# Patient Record
Sex: Male | Born: 1950
Health system: Southern US, Community
[De-identification: ages and names within clinical notes are randomized; demographics above are authoritative.]

## PROBLEM LIST (undated history)

## (undated) DIAGNOSIS — K589 Irritable bowel syndrome without diarrhea: Secondary | ICD-10-CM

## (undated) DIAGNOSIS — F329 Major depressive disorder, single episode, unspecified: Secondary | ICD-10-CM

## (undated) DIAGNOSIS — K219 Gastro-esophageal reflux disease without esophagitis: Secondary | ICD-10-CM

## (undated) DIAGNOSIS — E785 Hyperlipidemia, unspecified: Secondary | ICD-10-CM

## (undated) DIAGNOSIS — C966 Unifocal Langerhans-cell histiocytosis: Secondary | ICD-10-CM

## (undated) DIAGNOSIS — K259 Gastric ulcer, unspecified as acute or chronic, without hemorrhage or perforation: Secondary | ICD-10-CM

## (undated) DIAGNOSIS — G8929 Other chronic pain: Secondary | ICD-10-CM

## (undated) DIAGNOSIS — N400 Enlarged prostate without lower urinary tract symptoms: Secondary | ICD-10-CM

## (undated) DIAGNOSIS — T7840XA Allergy, unspecified, initial encounter: Secondary | ICD-10-CM

## (undated) DIAGNOSIS — M549 Dorsalgia, unspecified: Secondary | ICD-10-CM

## (undated) DIAGNOSIS — F32A Depression, unspecified: Secondary | ICD-10-CM

## (undated) DIAGNOSIS — Z6831 Body mass index (BMI) 31.0-31.9, adult: Secondary | ICD-10-CM

## (undated) DIAGNOSIS — I10 Essential (primary) hypertension: Secondary | ICD-10-CM

## (undated) HISTORY — PX: OTHER SURGICAL HISTORY: SHX169

## (undated) HISTORY — PX: GANGLION CYST EXCISION: SHX1691

## (undated) HISTORY — PX: BACK SURGERY: SHX140

## (undated) HISTORY — DX: Essential (primary) hypertension: I10

## (undated) HISTORY — DX: Dorsalgia, unspecified: M54.9

## (undated) HISTORY — DX: Allergy, unspecified, initial encounter: T78.40XA

## (undated) HISTORY — PX: HAND SURGERY: SHX662

## (undated) HISTORY — DX: Body mass index (BMI) 31.0-31.9, adult: Z68.31

## (undated) HISTORY — DX: Irritable bowel syndrome, unspecified: K58.9

## (undated) HISTORY — DX: Major depressive disorder, single episode, unspecified: F32.9

## (undated) HISTORY — DX: Gastric ulcer, unspecified as acute or chronic, without hemorrhage or perforation: K25.9

## (undated) HISTORY — DX: Hyperlipidemia, unspecified: E78.5

## (undated) HISTORY — DX: Other chronic pain: G89.29

## (undated) HISTORY — DX: Unifocal Langerhans-cell histiocytosis: C96.6

## (undated) HISTORY — PX: COLONOSCOPY: SHX174

## (undated) HISTORY — DX: Benign prostatic hyperplasia without lower urinary tract symptoms: N40.0

## (undated) HISTORY — DX: Depression, unspecified: F32.A

---

## 1992-06-08 HISTORY — PX: TUMOR REMOVAL: SHX12

## 2001-06-08 HISTORY — PX: COLONOSCOPY: SHX174

## 2001-09-09 ENCOUNTER — Ambulatory Visit (HOSPITAL_COMMUNITY): Admission: RE | Admit: 2001-09-09 | Discharge: 2001-09-09 | Payer: Self-pay | Admitting: Internal Medicine

## 2001-10-02 ENCOUNTER — Emergency Department (HOSPITAL_COMMUNITY): Admission: EM | Admit: 2001-10-02 | Discharge: 2001-10-02 | Payer: Self-pay | Admitting: Emergency Medicine

## 2001-10-25 ENCOUNTER — Ambulatory Visit (HOSPITAL_COMMUNITY): Admission: RE | Admit: 2001-10-25 | Discharge: 2001-10-25 | Payer: Self-pay | Admitting: Family Medicine

## 2001-10-25 ENCOUNTER — Encounter: Payer: Self-pay | Admitting: Family Medicine

## 2002-10-27 ENCOUNTER — Ambulatory Visit (HOSPITAL_COMMUNITY): Admission: RE | Admit: 2002-10-27 | Discharge: 2002-10-27 | Payer: Self-pay | Admitting: Family Medicine

## 2002-10-27 ENCOUNTER — Encounter: Payer: Self-pay | Admitting: Family Medicine

## 2004-07-12 ENCOUNTER — Emergency Department (HOSPITAL_COMMUNITY): Admission: EM | Admit: 2004-07-12 | Discharge: 2004-07-13 | Payer: Self-pay | Admitting: Emergency Medicine

## 2006-02-27 ENCOUNTER — Emergency Department (HOSPITAL_COMMUNITY): Admission: EM | Admit: 2006-02-27 | Discharge: 2006-02-27 | Payer: Self-pay | Admitting: Emergency Medicine

## 2006-07-14 ENCOUNTER — Emergency Department (HOSPITAL_COMMUNITY): Admission: EM | Admit: 2006-07-14 | Discharge: 2006-07-15 | Payer: Self-pay | Admitting: Emergency Medicine

## 2008-02-27 ENCOUNTER — Emergency Department (HOSPITAL_COMMUNITY): Admission: EM | Admit: 2008-02-27 | Discharge: 2008-02-28 | Payer: Self-pay | Admitting: Emergency Medicine

## 2008-05-29 ENCOUNTER — Ambulatory Visit (HOSPITAL_COMMUNITY): Admission: RE | Admit: 2008-05-29 | Discharge: 2008-05-29 | Payer: Self-pay | Admitting: Family Medicine

## 2008-06-14 ENCOUNTER — Ambulatory Visit (HOSPITAL_COMMUNITY): Admission: RE | Admit: 2008-06-14 | Discharge: 2008-06-14 | Payer: Self-pay | Admitting: Neurosurgery

## 2008-09-20 ENCOUNTER — Ambulatory Visit (HOSPITAL_COMMUNITY): Admission: RE | Admit: 2008-09-20 | Discharge: 2008-09-20 | Payer: Self-pay | Admitting: Neurosurgery

## 2009-02-06 ENCOUNTER — Emergency Department (HOSPITAL_COMMUNITY): Admission: EM | Admit: 2009-02-06 | Discharge: 2009-02-06 | Payer: Self-pay | Admitting: Emergency Medicine

## 2009-02-14 ENCOUNTER — Ambulatory Visit (HOSPITAL_COMMUNITY): Admission: RE | Admit: 2009-02-14 | Discharge: 2009-02-14 | Payer: Self-pay | Admitting: Family Medicine

## 2009-06-05 ENCOUNTER — Encounter (INDEPENDENT_AMBULATORY_CARE_PROVIDER_SITE_OTHER): Payer: Self-pay | Admitting: *Deleted

## 2009-06-07 ENCOUNTER — Emergency Department (HOSPITAL_COMMUNITY): Admission: EM | Admit: 2009-06-07 | Discharge: 2009-06-07 | Payer: Self-pay | Admitting: Emergency Medicine

## 2009-06-08 HISTORY — PX: UPPER GASTROINTESTINAL ENDOSCOPY: SHX188

## 2009-06-10 ENCOUNTER — Telehealth (INDEPENDENT_AMBULATORY_CARE_PROVIDER_SITE_OTHER): Payer: Self-pay

## 2009-06-17 DIAGNOSIS — R143 Flatulence: Secondary | ICD-10-CM

## 2009-06-17 DIAGNOSIS — R197 Diarrhea, unspecified: Secondary | ICD-10-CM | POA: Insufficient documentation

## 2009-06-17 DIAGNOSIS — R141 Gas pain: Secondary | ICD-10-CM | POA: Insufficient documentation

## 2009-06-17 DIAGNOSIS — R1310 Dysphagia, unspecified: Secondary | ICD-10-CM | POA: Insufficient documentation

## 2009-06-17 DIAGNOSIS — R109 Unspecified abdominal pain: Secondary | ICD-10-CM | POA: Insufficient documentation

## 2009-06-17 DIAGNOSIS — Z87448 Personal history of other diseases of urinary system: Secondary | ICD-10-CM

## 2009-06-17 DIAGNOSIS — R142 Eructation: Secondary | ICD-10-CM

## 2009-06-19 ENCOUNTER — Ambulatory Visit: Payer: Self-pay | Admitting: Gastroenterology

## 2009-06-19 DIAGNOSIS — K3189 Other diseases of stomach and duodenum: Secondary | ICD-10-CM | POA: Insufficient documentation

## 2009-06-19 DIAGNOSIS — K921 Melena: Secondary | ICD-10-CM | POA: Insufficient documentation

## 2009-06-19 DIAGNOSIS — R1013 Epigastric pain: Secondary | ICD-10-CM

## 2009-06-20 ENCOUNTER — Encounter: Payer: Self-pay | Admitting: Gastroenterology

## 2009-06-21 ENCOUNTER — Ambulatory Visit: Payer: Self-pay | Admitting: Gastroenterology

## 2009-06-21 ENCOUNTER — Ambulatory Visit (HOSPITAL_COMMUNITY): Admission: RE | Admit: 2009-06-21 | Discharge: 2009-06-21 | Payer: Self-pay | Admitting: Gastroenterology

## 2009-06-21 HISTORY — PX: COLONOSCOPY: SHX174

## 2009-06-27 ENCOUNTER — Telehealth: Payer: Self-pay | Admitting: Gastroenterology

## 2009-06-27 ENCOUNTER — Ambulatory Visit: Payer: Self-pay | Admitting: Gastroenterology

## 2009-06-27 ENCOUNTER — Ambulatory Visit (HOSPITAL_COMMUNITY): Admission: RE | Admit: 2009-06-27 | Discharge: 2009-06-27 | Payer: Self-pay | Admitting: Gastroenterology

## 2009-07-03 ENCOUNTER — Ambulatory Visit (HOSPITAL_COMMUNITY): Admission: RE | Admit: 2009-07-03 | Discharge: 2009-07-03 | Payer: Self-pay | Admitting: Gastroenterology

## 2009-07-10 ENCOUNTER — Encounter: Payer: Self-pay | Admitting: Gastroenterology

## 2009-08-07 ENCOUNTER — Telehealth: Payer: Self-pay | Admitting: Gastroenterology

## 2009-08-16 ENCOUNTER — Encounter (INDEPENDENT_AMBULATORY_CARE_PROVIDER_SITE_OTHER): Payer: Self-pay | Admitting: *Deleted

## 2009-08-27 ENCOUNTER — Ambulatory Visit: Payer: Self-pay | Admitting: Gastroenterology

## 2009-08-27 DIAGNOSIS — L980 Pyogenic granuloma: Secondary | ICD-10-CM | POA: Insufficient documentation

## 2009-09-02 ENCOUNTER — Telehealth (INDEPENDENT_AMBULATORY_CARE_PROVIDER_SITE_OTHER): Payer: Self-pay

## 2009-11-06 HISTORY — PX: UPPER GASTROINTESTINAL ENDOSCOPY: SHX188

## 2009-11-13 ENCOUNTER — Ambulatory Visit: Payer: Self-pay | Admitting: Gastroenterology

## 2009-11-21 ENCOUNTER — Ambulatory Visit (HOSPITAL_COMMUNITY): Admission: RE | Admit: 2009-11-21 | Discharge: 2009-11-21 | Payer: Self-pay | Admitting: Gastroenterology

## 2009-11-21 ENCOUNTER — Ambulatory Visit: Payer: Self-pay | Admitting: Gastroenterology

## 2009-12-05 ENCOUNTER — Encounter: Payer: Self-pay | Admitting: Gastroenterology

## 2009-12-10 ENCOUNTER — Encounter: Payer: Self-pay | Admitting: Gastroenterology

## 2009-12-16 ENCOUNTER — Encounter: Payer: Self-pay | Admitting: Gastroenterology

## 2009-12-17 ENCOUNTER — Encounter: Payer: Self-pay | Admitting: Gastroenterology

## 2009-12-19 LAB — CONVERTED CEMR LAB
Basophils Absolute: 0.1 10*3/uL (ref 0.0–0.1)
Basophils Relative: 1 % (ref 0–1)
Eosinophils Absolute: 0.4 10*3/uL (ref 0.0–0.7)
HCT: 47 % (ref 39.0–52.0)
Lymphocytes Relative: 24 % (ref 12–46)
MCHC: 33.4 g/dL (ref 30.0–36.0)
MCV: 85.1 fL (ref 78.0–100.0)
Monocytes Relative: 7 % (ref 3–12)
Neutro Abs: 4.4 10*3/uL (ref 1.7–7.7)
RBC: 5.52 M/uL (ref 4.22–5.81)
WBC: 7 10*3/uL (ref 4.0–10.5)

## 2009-12-26 ENCOUNTER — Ambulatory Visit: Payer: Self-pay | Admitting: Infectious Diseases

## 2009-12-26 LAB — CONVERTED CEMR LAB

## 2009-12-28 ENCOUNTER — Encounter: Payer: Self-pay | Admitting: Infectious Diseases

## 2009-12-31 ENCOUNTER — Encounter: Payer: Self-pay | Admitting: Infectious Diseases

## 2010-01-08 ENCOUNTER — Telehealth: Payer: Self-pay | Admitting: Gastroenterology

## 2010-01-10 ENCOUNTER — Ambulatory Visit: Payer: Self-pay | Admitting: Infectious Diseases

## 2010-01-21 ENCOUNTER — Encounter: Payer: Self-pay | Admitting: Gastroenterology

## 2010-01-28 ENCOUNTER — Telehealth: Payer: Self-pay | Admitting: Infectious Diseases

## 2010-01-28 ENCOUNTER — Encounter: Payer: Self-pay | Admitting: Infectious Diseases

## 2010-04-08 HISTORY — PX: UPPER GASTROINTESTINAL ENDOSCOPY: SHX188

## 2010-04-24 ENCOUNTER — Encounter (INDEPENDENT_AMBULATORY_CARE_PROVIDER_SITE_OTHER): Payer: Self-pay | Admitting: *Deleted

## 2010-04-25 ENCOUNTER — Encounter: Payer: Self-pay | Admitting: Gastroenterology

## 2010-04-29 ENCOUNTER — Ambulatory Visit (HOSPITAL_COMMUNITY): Admission: RE | Admit: 2010-04-29 | Discharge: 2010-04-29 | Payer: Self-pay | Admitting: Gastroenterology

## 2010-04-29 ENCOUNTER — Ambulatory Visit: Payer: Self-pay | Admitting: Gastroenterology

## 2010-07-08 NOTE — Assessment & Plan Note (Signed)
Summary: Dypepsia 2o to Eosinophilic granuloma, IBS   Visit Type:  Follow-up Visit Primary Care Provider:  Lala Lund, M.D.  Chief Complaint:  follow up- doing good.  History of Present Illness: Took all Abx and feels better. Dr. [name] wants to wait and see for now. Sx: fullness, bloating in lower abdomen- 1-2x/week. Feel a gas buildup and better if has BM mostly (mostly diarrhea). Stools mostly soft these days. Appetite is good. Lost 5 lbs, but wasn't trying to lose. No nausea or vomiting. Eats a little cheese. No eggs in 2 weeks. No milk since year. No ice cream.   Current Medications (verified): 1)  Verelan 180 Mg Xr24h-Cap (Verapamil Hcl) .... Take 1 Tablet By Mouth Once A Day 2)  Enalapril Maleate 20 Mg Tabs (Enalapril Maleate) .... Take 1 Tablet By Mouth Once A Day 3)  Pravastatin Sodium 20 Mg Tabs (Pravastatin Sodium) .... Take 1 Tab By Mouth At Bedtime 4)  Cardura 8 Mg Tabs (Doxazosin Mesylate) .... Take 1 Tab By Mouth At Bedtime 5)  Paxil 20 Mg Tabs (Paroxetine Hcl) .... Take 1 Tablet By Mouth Once A Day 6)  Protonix 40 Mg Tbec (Pantoprazole Sodium) .... Take 1 Tablet By Mouth 30 Minutes Prior To First Meal  Allergies (verified): 1)  ! Sulfa  Past History:  Past Medical History: Hyperlipidemia Hypertension Depression TCS 2003: NUR-normal Eosinophilic granuloma (gastric)  Social History: Occupation: unemployed, was working at [employer] Married: 35 years, met via family. 1 kid:  ~31. Quit smoking cigars: 2-3/day, but none for  ~5 years. Likes to garden-planting 2 acres every year.  Vital Signs:  Patient profile:   60 year old male Height:      64 inches Weight:      180 pounds BMI:     31.01 Temp:     97.9 degrees F oral Pulse rate:   60 / minute BP sitting:   118 / 80  (left arm) Cuff size:   regular  Vitals Entered By: Hendricks Limes LPN (August 27, 2009 9:55 AM)  Physical Exam  General:  Well developed, well nourished, no acute distress. Head:   Normocephalic and atraumatic. Eyes:  PERRLA, no icterus. Mouth:  No deformity or lesions. Neck:  Supple; no masses. Lungs:  Clear throughout to auscultation. Heart:  Regular rate and rhythm; no murmurs Abdomen:  Soft, nontender and nondistended. Normal bowel sounds. Extremities:  No edema noted. Neurologic:  Alert and  oriented x4;  grossly normal neurologically.  Impression & Recommendations:  Problem # 1:  ABDOMINAL PAIN (ICD-789.00)  [place] 2o to IBS. Add Benefiber daily and Intensive Bowel Support daily.   Orders: Est. Patient Level IV ([address])  Problem # 2:  GRANULOMA (ICD-686.1) Assessment: Unchanged  eosinophilic of the stomach, most likely cause of dyspepsia. Currently no Sx. OPV in 3 mo. May need to check CBC.  CC: PCP  Orders: Est. Patient Level IV ([address])  Patient Instructions: 1)  Add Benefiber 2 tsp daily. 2)  Add Intesive Bowel Support daily. 3)  Call me in 2 weeks. 4)  Continue to follow with Dr. [name]. 5)  Return visit in 3 months. 6)  The medication list was reviewed and reconciled.  All changed / newly prescribed medications were explained.  A complete medication list was provided to the patient / caregiver.

## 2010-07-08 NOTE — Progress Notes (Signed)
Summary: DR. Harley Hallmark  Spoek with Dr. [name]. Pt being treated empirically for H. pylori and then having follow up UGI. Consider surgery after imaging study obtained. [name] MD  August 07, 2009 2:09 PM

## 2010-07-08 NOTE — Letter (Signed)
Summary: TRIAGE ORDER  TRIAGE ORDER   Imported By: Ave Filter 04/25/2010 15:25:24  _____________________________________________________________________  External Attachment:    Type:   Image     Comment:   External Document

## 2010-07-08 NOTE — Letter (Signed)
Summary: Recall Radiology  [facility]  [address]   [place], [place] [address]   Phone: [phone]  Fax: [phone]    April 24, 2010  Adam [name] [address] [place], [place]  [address] 11-18-1950   Dear Adam [name],   Our office needs to get you scheduled for your Upper Endoscopy. Please give our office a call to schedule this.  You may call the office at your convenience at [phone].  Please ask for the Referral Coordinator to make arrangements for this to be scheduled.  You may have to leave a message on our voice mail.  We will return your call.  If for any reason you do not wish to schedule this, please advise the office.  Please do not neglect your health.   Thank you,    Ave Filter  [facility] Ph: [phone]   Fax: [phone]

## 2010-07-08 NOTE — Letter (Signed)
Summary: EGD ORDER  EGD ORDER   Imported By: Ave Filter 11/13/2009 16:22:19  _____________________________________________________________________  External Attachment:    Type:   Image     Comment:   External Document

## 2010-07-08 NOTE — Letter (Signed)
Summary: TCS/EGD ORDER  TCS/EGD ORDER   Imported By: Ave Filter 06/20/2009 17:47:38  _____________________________________________________________________  External Attachment:    Type:   Image     Comment:   External Document
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
## 2010-07-08 NOTE — Miscellaneous (Signed)
Summary: Orders Update  Clinical Lists Changes  Orders: Added new Test order of T-CBC w/Diff ([id number]) - Signed 

## 2010-07-08 NOTE — Consult Note (Signed)
Summary: [facility]: General Surgical  [facility]: General Surgical   Imported By: Florinda Marker 01/29/2010 15:47:40  _____________________________________________________________________  External Attachment:    Type:   Image     Comment:   External Document

## 2010-07-08 NOTE — Letter (Signed)
Summary: Generic Letter, Intro to Referring  [facility]  [address]   [place], [place] [address]   Phone: [phone]  Fax: [phone]      August 16, 2009             RE: Adam [name]   12-09-1950                 [address] [place]  [address]  Dear Mr [name] have an appointment with [place] on Tues March 15th. The provider you are scheduled to see will be out of the office. We need to rsch  your appt. We have tried to contact by phone and was unable to reach. you. Please call our office at [phone] so that we can reschedule your appointment.  Thank you.     Sincerely,  [facility] Ph: [phone]   Fax: [phone]

## 2010-07-08 NOTE — Progress Notes (Signed)
  Phone Note Call from Patient Call back at Home Phone [phone]   Caller: Patient Summary of Call: pt called- he wanted to let [facility] know that the fiber was doing great. Initial call taken by: Hendricks Limes LPN,  September 02, 2009 10:19 AM     Appended Document:  [facility] 2011: Dr. [facility]-recommend EGD in 2-3 mos. Call pt to schedule in June 2011, UJ:[id number] granuloma.  Appended Document:  Noted, will call pt in June.

## 2010-07-08 NOTE — Assessment & Plan Note (Signed)
Summary: NEW EVALUATE FOR PARASITIC INF/EOSINOPHILIC GRANULOMA   Primary Provider:  Simone Curia, M.D.  CC:  New patient / evaluate for parasitic inf/ eosinophilic granul.  History of Present Illness: 60 yo M with hx depression, HTN, hyperlipidemia. He had an  EGD and biopsy of gastric nodule showing eosinophilic abscesses and infiltrates. He was treated for H pylori and his GI signs improved. (H pylori studies negative). Strong consideration was given from path to the dx of anisakiasis or strongyloidiasis (although no organisms were seen).   Has lived in [place] his entire life, no hx of travel, never been in [place] [employer] [place]. No fevers or chills, wt is steady. No lymphadenopathy, no hx of raw fish ingestion, has prev cleaned fish.    Preventive Screening-Counseling & Management  Alcohol-Tobacco     Alcohol drinks/day: 0     Smoking Status: never  Caffeine-Diet-Exercise     Caffeine use/day: coffee,tea and sodas     Does Patient Exercise: yes     Type of exercise: yard and gardening  Safety-Violence-Falls     Seat Belt Use: yes   Updated Prior Medication List: VERELAN 180 MG XR24H-CAP (VERAPAMIL HCL) Take 1 tablet by mouth once a day ENALAPRIL MALEATE 20 MG TABS (ENALAPRIL MALEATE) Take 1 tablet by mouth once a day PRAVASTATIN SODIUM 20 MG TABS (PRAVASTATIN SODIUM) Take 1 tab by mouth at bedtime CARDURA 8 MG TABS (DOXAZOSIN MESYLATE) Take 1 tab by mouth at bedtime PAXIL 20 MG TABS (PAROXETINE HCL) Take 1 tablet by mouth once a day PROTONIX 40 MG TBEC (PANTOPRAZOLE SODIUM) Take 1 tablet by mouth 30 minutes prior to first meal  Current Allergies (reviewed today): ! SULFA Review of Systems        pets, cont GI upset, occas loose BM.   Vital Signs:  Patient profile:   60 year old male Height:      64 inches (162.56 cm) Weight:      179 pounds (81.36 kg) BMI:     30.84 Temp:     98.0 degrees F (36.67 degrees C) oral Pulse rate:   62 / minute BP sitting:   117 / 80  (left  arm)  Vitals Entered By: Baxter Hire) (December 26, 2009 2:06 PM) CC: New patient / evaluate for parasitic inf/ eosinophilic granul Pain Assessment Patient in pain? no      Nutritional Status BMI of > 30 = obese Nutritional Status Detail appetite varies per patient  Does patient need assistance? Functional Status Self care Ambulation Normal   Physical Exam  General:  well-developed, well-nourished, and well-hydrated.   Eyes:  pupils equal, pupils round, and pupils reactive to light.   Mouth:  pharynx pink and moist and no exudates.   Neck:  no masses.   Lungs:  normal respiratory effort and normal breath sounds.   Heart:  normal rate, regular rhythm, and no murmur.   Abdomen:  soft, non-tender, normal bowel sounds, no distention, and no masses.   Extremities:  no edema, partial R hand amputation.    Impression & Recommendations:  Problem # 1:  GRANULOMA (ICD-686.1)  Eosinophilic granuloma. The etiology of this unclear- he has no exposure to fish or farm animals or pets. There is a wide DDX- Ancylostoma, Anisakis, Ascaris, Strongyloides, Toxicara, Trichura, Capillaria, and Trichinella Will check stool studies, check serologies for above. His peripheral eosinophilia is low grade, only 6%.  return to clinic 3-4 weeks.   Orders: T-Culture, Stool ([id number]) T-Culture, Giardia / Cryptosporidium ([id number]) T- * Misc.  Laboratory test [phone]) T-HIV Antibody  (Reflex) [phone]) Consultation Level IV ([address])

## 2010-07-08 NOTE — Miscellaneous (Signed)
Summary: HIPAA Restrictions  HIPAA Restrictions   Imported By: Florinda Marker 12/26/2009 14:31:58  _____________________________________________________________________  External Attachment:    Type:   Image     Comment:   External Document

## 2010-07-08 NOTE — Letter (Signed)
Summary: CT SCAN OF ABD,PELVIS ORDER  CT SCAN OF ABD,PELVIS ORDER   Imported By: Ricard Dillon 07/10/2009 12:09:04  _____________________________________________________________________  External Attachment:    Type:   Image     Comment:   External Document

## 2010-07-08 NOTE — Letter (Signed)
Summary: [facility] OFFICE VISIT  [facility] OFFICE VISIT   Imported By: Rexene Alberts 01/21/2010 11:57:41  _____________________________________________________________________  External Attachment:    Type:   Image     Comment:   External Document

## 2010-07-08 NOTE — Progress Notes (Signed)
Summary: Start Protonix       New/Updated Medications: PROTONIX 40 MG TBEC (PANTOPRAZOLE SODIUM) Take 1 tablet by mouth 30 minutes prior to first meal Prescriptions: PROTONIX 40 MG TBEC (PANTOPRAZOLE SODIUM) Take 1 tablet by mouth 30 minutes prior to first meal  #30 x 5   Entered and Authorized by:   [name] MD   Signed by:   [name] MD on 06/27/2009   Method used:   Electronically to        [facility] [phone]* (retail)       [address]       [place], [place]  [address]       Ph: [phone] or [phone]       Fax: [phone]   RxID:   [id number]  Pt seen for EGD. Needs Protonix. [name] MD  June 27, 2009 8:32 AM

## 2010-07-08 NOTE — Consult Note (Signed)
Summary: New Pt. Referral: [facility]  New Pt. Referral: [facility]   Imported By: Florinda Marker 01/02/2010 15:58:17  _____________________________________________________________________  External Attachment:    Type:   Image     Comment:   External Document

## 2010-07-08 NOTE — Progress Notes (Signed)
Summary: pt appt  Phone Note Call from Patient Call back at Home Phone [phone]   Caller: Spouse- [name] Summary of Call: pts wife called- pt was seen in the ER on Friday with abd swelling, pain, vomiting and diarrhea. Wife stated they told him he was heme +. pt has appt with KJ on 07/08/09. Wife is upset and  wants him to see [facility] and wants him seen earlier. no appts available. please advise. Initial call taken by: Hendricks Limes LPN,  June 10, 2009 9:33 AM     Appended Document: pt appt Please call pt. I can see him on Jan 12. 18, or 19 at 130 PM.  Appended Document: pt appt he is going to be here on the 12th at 130p/

## 2010-07-08 NOTE — Assessment & Plan Note (Signed)
Summary: EOSINOPHILIC GRANULOMA   Visit Type:  Follow-up Visit Primary Care Provider:  Simone Curia, M.D.  Chief Complaint:  F/U abd pain.  History of Present Illness: Doing fairly well. Avoiding spicy foods, cheese, milk, and alcohol. No nause, vomiting. Rare abd pain if eats something he usually avoids for several times in a row.   Current Medications (verified): 1)  Verelan 180 Mg Xr24h-Cap (Verapamil Hcl) .... Take 1 Tablet By Mouth Once A Day 2)  Enalapril Maleate 20 Mg Tabs (Enalapril Maleate) .... Take 1 Tablet By Mouth Once A Day 3)  Pravastatin Sodium 20 Mg Tabs (Pravastatin Sodium) .... Take 1 Tab By Mouth At Bedtime 4)  Cardura 8 Mg Tabs (Doxazosin Mesylate) .... Take 1 Tab By Mouth At Bedtime 5)  Paxil 20 Mg Tabs (Paroxetine Hcl) .... Take 1 Tablet By Mouth Once A Day 6)  Protonix 40 Mg Tbec (Pantoprazole Sodium) .... Take 1 Tablet By Mouth 30 Minutes Prior To First Meal  Allergies (verified): 1)  ! Sulfa  Past History:  Family History: Last updated: 06/19/2009 FH of Colon Cancer: grandmother > 60 No polyps  Past Medical History: TCS JAN 2011: COMPLICATED BY hypoTN and bardycardia w/ Demerol, Phenergan, and Versed **Garden diverticulosis, Sml IH EGD w/ propofol-Bx: Eosinophilic granuloma (gastric) JAN 2011 TCS 2003: NUR-normal Hyperlipidemia Hypertension Depression  Vital Signs:  Patient profile:   60 year old male Height:      64 inches Weight:      180 pounds BMI:     31.01 Temp:     98.9 degrees F oral Pulse rate:   60 / minute BP sitting:   120 / 80  (left arm) Cuff size:   regular  Vitals Entered By: Cloria Spring LPN (November 13, 1608 3:56 PM)  Physical Exam  General:  Well developed, well nourished, no acute distress. Head:  Normocephalic and atraumatic. Lungs:  Clear throughout to auscultation. Heart:  Regular rate and rhythm; no murmurs Abdomen:  Soft, nontender and nondistended. Normal bowel sounds. Neurologic:  Alert and  oriented x4;   grossly normal neurologically.  Impression & Recommendations:  Problem # 1:  GRANULOMA (ICD-686.1) Assessment Improved  No resection at [facility]. Repeat EGD next THUR w/ propofol. OPV 6 mos.  CC: PCP  Orders: Est. Patient Level II ([address])  Problem # 2:  SCREENING, COLON CANCER (ICD-V76.51) Assessment: Comment Only Average risk for developing colon cancer. TCS 2021.  Appended Document: EOSINOPHILIC GRANULOMA REMINDER IN COMPUTER

## 2010-07-08 NOTE — Miscellaneous (Signed)
Summary: [facility]   Imported By: Florinda Marker 01/29/2010 09:26:10  _____________________________________________________________________  External Attachment:    Type:   Image     Comment:   External Document

## 2010-07-08 NOTE — Progress Notes (Signed)
Summary: Eosinophilic granuloma  Spoke with Dr [name] yesterday. Pt Sx improved and EGD W/O ULCERATION  after H. pylori treatment. Pending ID WORKUP. Repeat EGD  IN 6 MOS. [name] MD  January 08, 2010 5:34 PM   Appended Document: Eosinophilic granuloma Called pt at hm: no VM, wk-not valid. Will try again later.   Appended Document: Eosinophilic granuloma reminder in computer  Appended Document: Eosinophilic granuloma [place] with pt at home #. Pt feeling wel. Explained he needs EGD on DEC 2011. ID [facility] pending.

## 2010-07-08 NOTE — Assessment & Plan Note (Signed)
Summary: 2WK F/U/VS   Primary Provider:  Simone Curia, M.D.  CC:  2 week follow up.  History of Present Illness: 60 yo M with hx depression, HTN, hyperlipidemia. He had an  EGD and biopsy of gastric nodule showing eosinophilic abscesses and infiltrates. He was treated for H pylori and his GI signs improved. (H pylori studies negative). Strong consideration was given from path to the dx of anisakiasis or strongyloidiasis (although no organisms were seen).   Has lived in [place] his entire life, no hx of travel, never been in [place] [employer] [place] [employer]. No fevers or chills, wt is steady. No lymphadenopathy, no hx of raw fish ingestion, has prev cleaned fish.  Was seen in ID peviously, had negative- O&P, giardia/crypto, stool cx, HIV. Other tests not done as ordered (strongy, anisikiasis, toxicara, trichinella). has since had repeat EGD which showed resolution of ulcers (after H pylori treatment).  Feeling "about the same". Still having gas and occas soft BM. Having 3-4 in the AM. States last of these BMs will be loose. If he has heavy meal during the day, he will have to go again then.    Preventive Screening-Counseling & Management  Alcohol-Tobacco     Alcohol drinks/day: 0     Smoking Status: never  Caffeine-Diet-Exercise     Caffeine use/day: coffee,tea and sodas     Does Patient Exercise: yes     Type of exercise: yard and gardening  Safety-Violence-Falls     Seat Belt Use: yes   Current Allergies (reviewed today): ! SULFA Review of Systems       wt up 2#.   Vital Signs:  Patient profile:   60 year old male Height:      64 inches (162.56 cm) Weight:      181.0 pounds (82.27 kg) BMI:     31.18 Temp:     98.4 degrees F (36.89 degrees C) oral Pulse rate:   51 / minute BP sitting:   124 / 83  (left arm)  Vitals Entered By: Baxter Hire) (January 10, 2010 10:31 AM) CC: 2 week follow up Pain Assessment Patient in pain? no      Nutritional Status BMI of > 30 =  obese Nutritional Status Detail appetite is so-so per patient  Does patient need assistance? Functional Status Self care Ambulation Normal   Physical Exam  General:  well-developed, well-nourished, and well-hydrated.   Abdomen:  soft, non-tender, and normal bowel sounds.     Impression & Recommendations:  Problem # 1:  GRANULOMA (ICD-686.1)  the etiology of this is unclear. have spokenw ith lab and clarified his orders (apparently they were attempted to be run on stool). Will check these as well as a serum quantiferon gold, ACE level. will call pt with results.   Orders: Est. Patient Level III ([address]) T- * Misc. Laboratory test [phone])

## 2010-07-08 NOTE — Letter (Signed)
Summary: Internal Other  Internal Other   Imported By: Peggyann Shoals 12/05/2009 14:07:41  _____________________________________________________________________  External Attachment:    Type:   Image     Comment:   External Document

## 2010-07-11 NOTE — Letter (Signed)
Summary: [facility] referral paperwork  [facility] referral paperwork   Imported By: Minna Merritts 12/10/2009 16:21:02  _____________________________________________________________________  External Attachment:    Type:   Image     Comment:   External Document

## 2010-08-24 LAB — BASIC METABOLIC PANEL
BUN: 14 mg/dL (ref 6–23)
GFR calc non Af Amer: >60 mL/min (ref >60)
Glucose, Bld: 82 mg/dL (ref 70–99)
Sodium: 141 mEq/L (ref 135–145)

## 2010-08-24 LAB — HEMOGLOBIN AND HEMATOCRIT, BLOOD: Hemoglobin: 14.6 g/dL (ref 13.0–17.0)

## 2010-08-25 ENCOUNTER — Encounter: Payer: Self-pay | Admitting: *Deleted

## 2010-08-26 NOTE — Progress Notes (Signed)
  Phone Note Call from Patient   Caller: Patient Summary of Call: Patient wanting reults of lab work Initial call taken by: Starleen Arms CMA,  January 28, 2010 9:53 AM

## 2010-09-08 LAB — URINALYSIS, ROUTINE W REFLEX MICROSCOPIC
Glucose, UA: NEGATIVE mg/dL
Protein, ur: NEGATIVE mg/dL
Urobilinogen, UA: 0.2 mg/dL (ref 0.0–1.0)

## 2010-09-08 LAB — COMPREHENSIVE METABOLIC PANEL
AST: 32 U/L (ref 0–37)
Albumin: 3.8 g/dL (ref 3.5–5.2)
Alkaline Phosphatase: 55 U/L (ref 39–117)
Chloride: 104 mEq/L (ref 96–112)
GFR calc Af Amer: >60 mL/min (ref >60)
Potassium: 4.4 mEq/L (ref 3.5–5.1)
Sodium: 137 mEq/L (ref 135–145)
Total Bilirubin: 0.6 mg/dL (ref 0.3–1.2)

## 2010-09-08 LAB — DIFFERENTIAL
Basophils Absolute: 0 10*3/uL (ref 0.0–0.1)
Basophils Relative: 1 % (ref 0–1)
Eosinophils Relative: 9 % — ABNORMAL HIGH (ref 0–5)
Monocytes Absolute: 0.6 10*3/uL (ref 0.1–1.0)

## 2010-09-08 LAB — CBC
HCT: 46.8 % (ref 39.0–52.0)
Platelets: 270 10*3/uL (ref 150–400)
WBC: 6.6 10*3/uL (ref 4.0–10.5)

## 2010-09-12 LAB — DIFFERENTIAL
Basophils Absolute: 0 10*3/uL (ref 0.0–0.1)
Eosinophils Absolute: 0.3 10*3/uL (ref 0.0–0.7)
Lymphs Abs: 1.1 10*3/uL (ref 0.7–4.0)
Neutrophils Relative %: 76 % (ref 43–77)

## 2010-09-12 LAB — CBC
MCV: 86.2 fL (ref 78.0–100.0)
Platelets: 252 10*3/uL (ref 150–400)
RDW: 14.1 % (ref 11.5–15.5)
WBC: 7.4 10*3/uL (ref 4.0–10.5)

## 2010-09-12 LAB — POCT CARDIAC MARKERS: Myoglobin, poc: 97.1 ng/mL (ref 12–200)

## 2010-09-12 LAB — BASIC METABOLIC PANEL
BUN: 15 mg/dL (ref 6–23)
Chloride: 106 mEq/L (ref 96–112)
Creatinine, Ser: 1.19 mg/dL (ref 0.4–1.5)
GFR calc non Af Amer: >60 mL/min (ref >60)
Glucose, Bld: 123 mg/dL — ABNORMAL HIGH (ref 70–99)

## 2010-09-22 LAB — BASIC METABOLIC PANEL
CO2: 27 mEq/L (ref 19–32)
Chloride: 103 mEq/L (ref 96–112)
Creatinine, Ser: 1.07 mg/dL (ref 0.4–1.5)
GFR calc Af Amer: >60 mL/min (ref >60)
Potassium: 4.4 mEq/L (ref 3.5–5.1)
Sodium: 137 mEq/L (ref 135–145)

## 2010-09-22 LAB — CBC
HCT: 48.3 % (ref 39.0–52.0)
Hemoglobin: 15.6 g/dL (ref 13.0–17.0)
MCHC: 32.4 g/dL (ref 30.0–36.0)
MCV: 87.2 fL (ref 78.0–100.0)
RBC: 5.54 MIL/uL (ref 4.22–5.81)
WBC: 9.8 10*3/uL (ref 4.0–10.5)

## 2010-10-14 ENCOUNTER — Encounter: Payer: Self-pay | Admitting: Gastroenterology

## 2010-10-21 NOTE — Op Note (Signed)
[name], [name]          ACCOUNT NO.:  [id number]   MEDICAL RECORD NO.:  [id number]          PATIENT TYPE:  OIB   LOCATION:  3533                         FACILITY:  [facility]   PHYSICIAN:  Reinaldo Meeker, M.D. DATE OF BIRTH:  June 20, 1950   DATE OF PROCEDURE:  06/14/2008  DATE OF DISCHARGE:  06/14/2008                               OPERATIVE REPORT   PREOPERATIVE DIAGNOSIS:  Herniated disk L4-5 right with superior  fragment with compression of L4 and L5 nerve roots.   POSTOPERATIVE DIAGNOSIS:  Herniated disk L4-5 right with superior  fragment with compression of L4 and L5 nerve roots.   PROCEDURE:  Right L4-5 interlaminar laminotomy for excision of herniated  disk with operative microscope.   SURGEON:  Reinaldo Meeker, MD   ASSISTANT:  Donalee Citrin, MD   PROCEDURE IN DETAIL:  After being placed in the prone position, the  patient's back was prepped and draped in the usual sterile fashion.  Localizing x-ray was taken prior to incision to identify the appropriate  level.  Midline incision was made above the spinous process of L4 and  L5.  Using Bovie cutting current, the incision was carried down the  spinous processes.  Subperiosteal dissection was then carried out on the  right-sided spinous processes and the lamina and a self-retaining  retractor was placed for exposure.  X-rays showed approach to the  appropriate level.  Using a high-speed drill, the inferior 80% of the L4  lamina, the medial third of the facet joint and the superior edge of the  L5 lamina were removed.  Residual bone ligamentum flavum removed in a  piecemeal fashion.  Microscope was draped, brought into the field and  used for the remainder of the case.  At this time, the L4 and L5 nerve  roots could be identified easily.  There was a large amount of herniated  disk material between the L4 beneath the L4 nerve and this was removed  in a piecemeal fashion and it gave excellent decompression.  The disk  space at L4-5 was then incised and thoroughly cleaned out with pituitary  rongeurs and curettes.  At this time, inspection was carried out in all  directions for any evidence of residual compression on the L4 or L5  nerve roots and none could be identified.  Large amounts of irrigation  were carried out and any bleeding controlled with bipolar coagulation  and Gelfoam.  The wound was then closed in multiple layers of Vicryl in  the muscle fascia, subcutaneous and subcuticular tissues and staples  were placed on the skin.  A sterile dressing was then applied.  The  patient was extubated and taken to the recovery room in stable  condition.           ______________________________  Reinaldo Meeker, M.D.     ROK/MEDQ  D:  06/14/2008  T:  06/14/2008  Job:  [id number]

## 2010-10-24 NOTE — Procedures (Signed)
Adam [name], Adam [name]          ACCOUNT NO.:  [id number]   MEDICAL RECORD NO.:  [id number]          PATIENT TYPE:  EMS   LOCATION:  ED                            FACILITY:  [facility]   PHYSICIAN:  Edward L. Juanetta Gosling, M.D.DATE OF BIRTH:  1950-11-11   DATE OF PROCEDURE:  07/12/2004  DATE OF DISCHARGE:  07/13/2004                                EKG INTERPRETATION   RESULTS:  The rhythm is sinus rhythm with the rate in the 50s.  There is  first degree AV block.  Left atrial enlargement is seen.  There are Q-waves  in V1 and 2, which may indicate a previous anteroseptal myocardial  infarction and clinical correlation is suggested.  QT interval is somewhat  short.  ST elevation is seen inferiorly and slightly in the V3, 4, 5, and 6,  of questionable significance, and clinical correlation is suggested.  Abnormal electrocardiogram.      ELH/MEDQ  D:  07/13/2004  T:  07/13/2004  Job:  [id number]

## 2010-10-29 ENCOUNTER — Encounter: Payer: Self-pay | Admitting: Gastroenterology

## 2010-10-29 ENCOUNTER — Ambulatory Visit (INDEPENDENT_AMBULATORY_CARE_PROVIDER_SITE_OTHER): Payer: BC Managed Care – PPO | Admitting: Gastroenterology

## 2010-10-29 VITALS — BP 120/78 | HR 65 | Temp 97.9°F | Ht 64.0 in | Wt 175.4 lb

## 2010-10-29 DIAGNOSIS — L980 Pyogenic granuloma: Secondary | ICD-10-CM

## 2010-10-29 DIAGNOSIS — K3189 Other diseases of stomach and duodenum: Secondary | ICD-10-CM

## 2010-10-29 DIAGNOSIS — R1013 Epigastric pain: Secondary | ICD-10-CM

## 2010-10-29 MED ORDER — PANTOPRAZOLE SODIUM 40 MG PO TBEC: 40.0000 mg | DELAYED_RELEASE_TABLET | Freq: Every day | ORAL | Status: DC

## 2010-10-29 NOTE — Assessment & Plan Note (Signed)
Sx controlled.  Refill Protonix for one year.

## 2010-10-29 NOTE — Progress Notes (Signed)
Cc to PCP 

## 2010-10-29 NOTE — Assessment & Plan Note (Addendum)
No Sx.  Repeat EGD in NOV 2013 unless pt Sx. No surgery planned at this time. OPV in 12 mos.

## 2010-10-29 NOTE — Progress Notes (Signed)
Reminder in epic for pt to f/u in one year with [facility]

## 2010-10-29 NOTE — Progress Notes (Signed)
Subjective:     Patient ID: Adam [name], male   DOB: 1950-06-29, 60 y.o.   MRN: [id number]  PCP: Gerda Diss  HPI  Feeling good. Heartburn and upset stomach: rare. Usu. Occurs if eats teh wrong thing or doesn't eat for a long period of time. Lost 5 lbs.  Past Medical History  Diagnosis Date  . Eosinophilic granuloma JAN 2011 GASTRIC, followed by Dr. [name]    EGD JAN 2011, JUN 2011, NOV 2011  . HTN (hypertension)   . Hyperlipemia   . Depression   . BMI 31.0-31.9,adult JUNE 2011 180 LBS    Past Surgical History  Procedure Date  . Hand surgery at the age of 20    Brother accidentally cut it  . Ganglion cyst excision     from L wrist and pins placed for Fx involving L thumb  . Back surgery   . Tumor removal 1994    Benign from brain at [facility]  . Cyst removed     from Left leg and knee  . Upper gastrointestinal endoscopy JAN 2011    GASTRIC ULCER-EOS GRANULOMA  . Upper gastrointestinal endoscopy JUN 2011    GASTRIC ULCER- EOS, NO H. PYLORI  . Upper gastrointestinal endoscopy NOV 2011    GASTRIC NODULE, no ulcer- EOS  . Colonoscopy JAN 2011 COMPLETE    hypoTN and bradycardia w/ Demerol ,Phenergan and Versed, [place] TICS, SML IH  . Colonoscopy 2003    NUR-normal   Allergies  Allergen Reactions  . Sulfonamide Derivatives     REACTION: causes hives   Current Outpatient Prescriptions  Medication Sig Dispense Refill  . doxazosin (CARDURA) 8 MG tablet Take 8 mg by mouth at bedtime.        . enalapril (VASOTEC) 20 MG tablet Take 20 mg by mouth daily.        . pantoprazole (PROTONIX) 40 MG tablet Take 1 tablet (40 mg total) by mouth daily. 30 minutes prior to first meal  30 tablet  11  . PARoxetine (PAXIL) 20 MG tablet Take 20 mg by mouth daily.        . pravastatin (PRAVACHOL) 20 MG tablet Take 20 mg by mouth at bedtime.        . verapamil (VERELAN PM) 180 MG 24 hr capsule Take 180 mg by mouth daily.        Marland Kitchen DISCONTD: pantoprazole (PROTONIX) 40 MG tablet Take 40 mg  by mouth daily. 30 minutes prior to first meal            Review of Systems  All other systems reviewed and are negative.       Objective:   Physical Exam  Vitals reviewed. Constitutional: He is oriented to person, place, and time. He appears well-developed and well-nourished. He appears distressed.  HENT:  Head: Normocephalic and atraumatic.  Cardiovascular: Normal rate, regular rhythm and normal heart sounds.   Pulmonary/Chest: Effort normal.  Abdominal: Soft. Bowel sounds are normal. He exhibits no distension. There is no tenderness.  Neurological: He is alert and oriented to person, place, and time.       Assessment:         Plan:

## 2010-12-29 ENCOUNTER — Other Ambulatory Visit: Payer: Self-pay

## 2010-12-29 DIAGNOSIS — R1013 Epigastric pain: Secondary | ICD-10-CM

## 2010-12-30 ENCOUNTER — Other Ambulatory Visit: Payer: Self-pay

## 2010-12-30 DIAGNOSIS — R1013 Epigastric pain: Secondary | ICD-10-CM

## 2010-12-30 NOTE — Telephone Encounter (Signed)
The patient has had this refilled already. The drug store said that the patients can call in automated refill and request it, so that is how we keep getting them.

## 2010-12-30 NOTE — Telephone Encounter (Signed)
Please find out why they are sending this. One year of refill provided at [facility] 10/2010.

## 2011-07-07 DIAGNOSIS — K5281 Eosinophilic gastritis or gastroenteritis: Secondary | ICD-10-CM | POA: Insufficient documentation

## 2011-11-22 ENCOUNTER — Other Ambulatory Visit: Payer: Self-pay | Admitting: Gastroenterology

## 2011-11-23 ENCOUNTER — Encounter: Payer: Self-pay | Admitting: Gastroenterology

## 2011-11-23 NOTE — Telephone Encounter (Signed)
Pt is aware of OV on 7/24 at 0900 with SF and appt card was mailed °

## 2011-11-23 NOTE — Telephone Encounter (Signed)
Pt needs 49yr OV FU. RF x 1 month until OV

## 2011-11-23 NOTE — Telephone Encounter (Signed)
Pt aware. ( OK to schedule Ov appt with Dr. Darrick Penna)

## 2011-12-09 ENCOUNTER — Encounter: Payer: Self-pay | Admitting: Gastroenterology

## 2011-12-30 ENCOUNTER — Ambulatory Visit (INDEPENDENT_AMBULATORY_CARE_PROVIDER_SITE_OTHER): Payer: BC Managed Care – PPO | Admitting: Urgent Care

## 2011-12-30 ENCOUNTER — Encounter: Payer: Self-pay | Admitting: Urgent Care

## 2011-12-30 ENCOUNTER — Ambulatory Visit: Payer: BC Managed Care – PPO | Admitting: Gastroenterology

## 2011-12-30 VITALS — BP 123/76 | HR 56 | Temp 97.0°F | Ht 64.0 in | Wt 173.2 lb

## 2011-12-30 DIAGNOSIS — L980 Pyogenic granuloma: Secondary | ICD-10-CM

## 2011-12-30 DIAGNOSIS — K219 Gastro-esophageal reflux disease without esophagitis: Secondary | ICD-10-CM

## 2011-12-30 MED ORDER — PANTOPRAZOLE SODIUM 40 MG PO TBEC: 40.0000 mg | DELAYED_RELEASE_TABLET | Freq: Every day | ORAL | Status: DC

## 2011-12-30 NOTE — Progress Notes (Signed)
Faxed to PCP, Dr Luking 

## 2011-12-30 NOTE — Progress Notes (Signed)
Referring Provider: Merlyn Albert, MD Primary Care Physician:  Harlow Asa, MD Primary Gastroenterologist:  Dr. Jonette Eva  Chief Complaint  Patient presents with  . Follow-up  . Medication Refill    HPI:  Adam [name] is a 61 y.o. male here for follow up for GERD and history of eosinophilic granuloma January 2011. He is due for EGD November 2013. He has been taking Protonix 40 mg daily. He is doing very well. He denies any concerns today. Specifically he denies any heartburn, indigestion, nausea, vomiting, dysphagia, odynophagia or anorexia.  His weight has remained stable.  Past Medical History  Diagnosis Date  . Eosinophilic granuloma JAN 2011 GASTRIC, followed by Dr. [name]    EGD JAN 2011, JUN 2011, NOV 2011  . HTN (hypertension)   . Hyperlipemia   . Depression   . BMI 31.0-31.9,adult JUNE 2011 180 LBS     Past Surgical History  Procedure Date  . Hand surgery at the age of 55    Brother accidentally cut it  . Ganglion cyst excision     from L wrist and pins placed for Fx involving L thumb  . Back surgery   . Tumor removal 1994    Benign from brain at [facility]  . Cyst removed     from Left leg and knee  . Upper gastrointestinal endoscopy JAN 2011    GASTRIC ULCER-EOS GRANULOMA  . Upper gastrointestinal endoscopy JUN 2011    GASTRIC ULCER- EOS, NO H. PYLORI  . Upper gastrointestinal endoscopy NOV 2011    GASTRIC NODULE, no ulcer- EOS  . Colonoscopy JAN 2011 COMPLETE    hypoTN and bradycardia w/ Demerol ,Phenergan and Versed, [place] TICS, SML IH  . Colonoscopy 2003    NUR-normal  . Colonoscopy 06/21/09    Fields-sigmoid diverticulosis, sm int hemorrhoids    Current Outpatient Prescriptions  Medication Sig Dispense Refill  . doxazosin (CARDURA) 8 MG tablet Take 8 mg by mouth at bedtime.        . enalapril (VASOTEC) 20 MG tablet Take 20 mg by mouth daily.        . pantoprazole (PROTONIX) 40 MG tablet TAKE 1 TABLET EVERY DAY 30 MINUTES PRIOR TO 1ST  MEAL  30 tablet  0  . PARoxetine (PAXIL) 20 MG tablet Take 20 mg by mouth daily.        . pravastatin (PRAVACHOL) 20 MG tablet Take 20 mg by mouth at bedtime.        . verapamil (VERELAN PM) 180 MG 24 hr capsule Take 180 mg by mouth daily.          Allergies as of 12/30/2011 - Review Complete 12/30/2011  Allergen Reaction Noted  . Sulfonamide derivatives      Review of Systems: Gen: Denies any fever, chills, sweats, anorexia, fatigue, weakness, malaise, weight loss, and sleep disorder CV: Denies chest pain, angina, palpitations, syncope, orthopnea, PND, peripheral edema, and claudication. Resp: Denies dyspnea at rest, dyspnea with exercise, cough, sputum, wheezing, coughing up blood, and pleurisy. GI: Denies vomiting blood, jaundice, and fecal incontinence.   Denies dysphagia or odynophagia. Derm: Denies rash, itching, dry skin, hives, moles, warts, or unhealing ulcers.  Psych: Denies depression, anxiety, memory loss, suicidal ideation, hallucinations, paranoia, and confusion. Heme: Denies bruising, bleeding, and enlarged lymph nodes.  Physical Exam: BP 123/76  Pulse 56  Temp 97 F (36.1 C) (Temporal)  Ht 5\' 4"  (1.626 m)  Wt 173 lb 3.2 oz (78.563 kg)  BMI 29.73  kg/m2 General:   Alert,  Well-developed, well-nourished, pleasant and cooperative in NAD Eyes:  Sclera clear, no icterus.   Conjunctiva pink. Mouth:  No deformity or lesions, oropharynx pink and moist. Neck:  Supple; no masses or thyromegaly. Heart:  Regular rate and rhythm; no murmurs, clicks, rubs,  or gallops. Lungs: CTA bilaterally Abdomen:  Normal bowel sounds.  No bruits.  Soft, non-tender and non-distended without masses, hepatosplenomegaly or hernias noted.  No guarding or rebound tenderness.   Rectal:  Deferred.  Msk:  Symmetrical without gross deformities.  Pulses:  Normal pulses noted. Extremities:  No clubbing or edema. Neurologic:  Alert and oriented x4;  grossly normal neurologically. Skin:  Intact without  significant lesions or rashes.

## 2011-12-30 NOTE — Assessment & Plan Note (Signed)
Adam [name] is a pleasant 61 y.o. male with hx eosinophilic granuloma. Doing well on Protonix 40 mg daily. Repeat EGD in November 2013 w/ Dr Darrick Penna.(Patient to be triaged) Call sooner if any problems.

## 2011-12-30 NOTE — Patient Instructions (Addendum)
You will need an EGD (upper endoscopy) with Dr. Darrick Penna in November.  We will call you to update your records, but you do not need to come in for office visit prior to this EGD. Continue Protonix 40 mg daily. If you have any problems, call us.

## 2012-01-27 NOTE — Progress Notes (Signed)
REVIEWED. AGREE. 

## 2012-04-29 ENCOUNTER — Other Ambulatory Visit: Payer: Self-pay

## 2012-04-29 ENCOUNTER — Ambulatory Visit (HOSPITAL_COMMUNITY): Payer: BC Managed Care – PPO | Admitting: Anesthesiology

## 2012-04-29 ENCOUNTER — Encounter (HOSPITAL_COMMUNITY): Payer: Self-pay | Admitting: Anesthesiology

## 2012-04-29 ENCOUNTER — Encounter (HOSPITAL_COMMUNITY): Payer: Self-pay

## 2012-04-29 ENCOUNTER — Encounter (HOSPITAL_COMMUNITY)
Admission: RE | Disposition: A | Discharge status: Home / Self Care | Payer: Self-pay | Source: Ambulatory Visit | Attending: General Surgery

## 2012-04-29 ENCOUNTER — Ambulatory Visit (HOSPITAL_COMMUNITY)
Admission: RE | Admit: 2012-04-29 | Discharge: 2012-04-29 | Disposition: A | Discharge status: Home / Self Care | Payer: BC Managed Care – PPO | Source: Ambulatory Visit | Attending: General Surgery | Admitting: General Surgery

## 2012-04-29 DIAGNOSIS — D1801 Hemangioma of skin and subcutaneous tissue: Secondary | ICD-10-CM | POA: Insufficient documentation

## 2012-04-29 DIAGNOSIS — R2230 Localized swelling, mass and lump, unspecified upper limb: Secondary | ICD-10-CM

## 2012-04-29 DIAGNOSIS — I10 Essential (primary) hypertension: Secondary | ICD-10-CM | POA: Insufficient documentation

## 2012-04-29 DIAGNOSIS — Z01812 Encounter for preprocedural laboratory examination: Secondary | ICD-10-CM | POA: Insufficient documentation

## 2012-04-29 HISTORY — PX: MASS EXCISION: SHX2000

## 2012-04-29 HISTORY — DX: Gastro-esophageal reflux disease without esophagitis: K21.9

## 2012-04-29 LAB — BASIC METABOLIC PANEL
BUN: 15 mg/dL (ref 6–23)
CO2: 27 mEq/L (ref 19–32)
Calcium: 9.8 mg/dL (ref 8.4–10.5)
GFR calc non Af Amer: 63 mL/min — ABNORMAL LOW (ref >90)
Glucose, Bld: 101 mg/dL — ABNORMAL HIGH (ref 70–99)

## 2012-04-29 LAB — CBC
HCT: 45.3 % (ref 39.0–52.0)
Hemoglobin: 15.8 g/dL (ref 13.0–17.0)
MCH: 29.2 pg (ref 26.0–34.0)
MCHC: 34.9 g/dL (ref 30.0–36.0)
MCV: 83.6 fL (ref 78.0–100.0)
RBC: 5.42 MIL/uL (ref 4.22–5.81)

## 2012-04-29 SURGERY — EXCISION MASS
Anesthesia: General | Site: Arm Upper | Laterality: Right | Wound class: Clean

## 2012-04-29 MED ORDER — PROPOFOL 10 MG/ML IV BOLUS
INTRAVENOUS | Status: DC | PRN
  Administered 2012-04-29: 160 mg via INTRAVENOUS

## 2012-04-29 MED ORDER — FENTANYL CITRATE 0.05 MG/ML IJ SOLN
INTRAMUSCULAR | Status: DC | PRN
  Administered 2012-04-29: 50 ug via INTRAVENOUS
  Administered 2012-04-29 (×2): 25 ug via INTRAVENOUS

## 2012-04-29 MED ORDER — ONDANSETRON HCL 4 MG/2ML IJ SOLN
INTRAMUSCULAR | Status: AC
  Filled 2012-04-29: qty 2

## 2012-04-29 MED ORDER — HYDROCODONE-ACETAMINOPHEN 5-325 MG PO TABS: 1.0000 | ORAL_TABLET | ORAL | Status: DC | PRN

## 2012-04-29 MED ORDER — FENTANYL CITRATE 0.05 MG/ML IJ SOLN: 25.0000 ug | INTRAMUSCULAR | Status: DC | PRN

## 2012-04-29 MED ORDER — MIDAZOLAM HCL 2 MG/2ML IJ SOLN
INTRAMUSCULAR | Status: AC
  Filled 2012-04-29: qty 2

## 2012-04-29 MED ORDER — EPHEDRINE SULFATE 50 MG/ML IJ SOLN
INTRAMUSCULAR | Status: AC
  Filled 2012-04-29: qty 1

## 2012-04-29 MED ORDER — FENTANYL CITRATE 0.05 MG/ML IJ SOLN
INTRAMUSCULAR | Status: AC
  Filled 2012-04-29: qty 2

## 2012-04-29 MED ORDER — ENOXAPARIN SODIUM 40 MG/0.4ML ~~LOC~~ SOLN
SUBCUTANEOUS | Status: AC
  Filled 2012-04-29: qty 0.4

## 2012-04-29 MED ORDER — CEFAZOLIN SODIUM-DEXTROSE 2-3 GM-% IV SOLR
2.0000 g | INTRAVENOUS | Status: AC
  Administered 2012-04-29: 2 g via INTRAVENOUS

## 2012-04-29 MED ORDER — ENOXAPARIN SODIUM 40 MG/0.4ML ~~LOC~~ SOLN
40.0000 mg | Freq: Once | SUBCUTANEOUS | Status: AC
  Administered 2012-04-29: 40 mg via SUBCUTANEOUS

## 2012-04-29 MED ORDER — LIDOCAINE HCL (PF) 1 % IJ SOLN
INTRAMUSCULAR | Status: AC
  Filled 2012-04-29: qty 5

## 2012-04-29 MED ORDER — MUPIROCIN 2 % EX OINT
TOPICAL_OINTMENT | CUTANEOUS | Status: AC
  Filled 2012-04-29: qty 22

## 2012-04-29 MED ORDER — MUPIROCIN CALCIUM 2 % EX CREA
TOPICAL_CREAM | Freq: Two times a day (BID) | CUTANEOUS | Status: DC
  Administered 2012-04-29: 09:00:00 via TOPICAL
  Filled 2012-04-29: qty 15

## 2012-04-29 MED ORDER — PROPOFOL 10 MG/ML IV EMUL
INTRAVENOUS | Status: AC
  Filled 2012-04-29: qty 20

## 2012-04-29 MED ORDER — SODIUM CHLORIDE 0.9 % IR SOLN
Status: DC | PRN
  Administered 2012-04-29: 1000 mL

## 2012-04-29 MED ORDER — ONDANSETRON HCL 4 MG/2ML IJ SOLN: 4.0000 mg | Freq: Once | INTRAMUSCULAR | Status: DC | PRN

## 2012-04-29 MED ORDER — MIDAZOLAM HCL 2 MG/2ML IJ SOLN
1.0000 mg | INTRAMUSCULAR | Status: DC | PRN
  Administered 2012-04-29: 2 mg via INTRAVENOUS

## 2012-04-29 MED ORDER — CEFAZOLIN SODIUM-DEXTROSE 2-3 GM-% IV SOLR
INTRAVENOUS | Status: AC
  Filled 2012-04-29: qty 50

## 2012-04-29 MED ORDER — BUPIVACAINE HCL (PF) 0.5 % IJ SOLN
INTRAMUSCULAR | Status: AC
  Filled 2012-04-29: qty 30

## 2012-04-29 MED ORDER — LIDOCAINE HCL (CARDIAC) 20 MG/ML IV SOLN
INTRAVENOUS | Status: DC | PRN
  Administered 2012-04-29: 30 mg via INTRAVENOUS

## 2012-04-29 MED ORDER — ONDANSETRON HCL 4 MG/2ML IJ SOLN
4.0000 mg | Freq: Once | INTRAMUSCULAR | Status: AC
  Administered 2012-04-29: 4 mg via INTRAVENOUS

## 2012-04-29 MED ORDER — EPHEDRINE SULFATE 50 MG/ML IJ SOLN
INTRAMUSCULAR | Status: DC | PRN
  Administered 2012-04-29: 10 mg via INTRAVENOUS
  Administered 2012-04-29 (×2): 5 mg via INTRAVENOUS

## 2012-04-29 MED ORDER — LACTATED RINGERS IV SOLN
INTRAVENOUS | Status: DC
Start: 2012-04-29 — End: 2012-04-29
  Administered 2012-04-29 (×2): via INTRAVENOUS

## 2012-04-29 MED ORDER — BUPIVACAINE HCL (PF) 0.5 % IJ SOLN
INTRAMUSCULAR | Status: DC | PRN
  Administered 2012-04-29: 5 mL

## 2012-04-29 SURGICAL SUPPLY — 33 items
APPLIER CLIP 9.375 SM OPEN (CLIP) ×2
BAG HAMPER (MISCELLANEOUS) ×2 IMPLANT
BENZOIN TINCTURE PRP APPL 2/3 (GAUZE/BANDAGES/DRESSINGS) ×2 IMPLANT
CLIP APPLIE 9.375 SM OPEN (CLIP) ×1 IMPLANT
CLOTH BEACON ORANGE TIMEOUT ST (SAFETY) ×2 IMPLANT
COVER LIGHT HANDLE STERIS (MISCELLANEOUS) ×4 IMPLANT
DURAPREP 26ML APPLICATOR (WOUND CARE) ×2 IMPLANT
ELECT NEEDLE TIP 2.8 STRL (NEEDLE) IMPLANT
ELECT REM PT RETURN 9FT ADLT (ELECTROSURGICAL) ×2
ELECTRODE REM PT RTRN 9FT ADLT (ELECTROSURGICAL) ×1 IMPLANT
FORMALIN 10 PREFIL 120ML (MISCELLANEOUS) ×2 IMPLANT
GLOVE BIOGEL PI IND STRL 7.5 (GLOVE) ×2 IMPLANT
GLOVE BIOGEL PI INDICATOR 7.5 (GLOVE) ×2
GLOVE ECLIPSE 7.0 STRL STRAW (GLOVE) ×6 IMPLANT
GOWN STRL REIN XL XLG (GOWN DISPOSABLE) ×4 IMPLANT
KIT ROOM TURNOVER APOR (KITS) ×2 IMPLANT
MANIFOLD NEPTUNE II (INSTRUMENTS) ×2 IMPLANT
NEEDLE HYPO 18GX1.5 BLUNT FILL (NEEDLE) IMPLANT
NEEDLE HYPO 25X1 1.5 SAFETY (NEEDLE) ×2 IMPLANT
NS IRRIG 1000ML POUR BTL (IV SOLUTION) ×2 IMPLANT
PACK MINOR (CUSTOM PROCEDURE TRAY) ×2 IMPLANT
PAD ARMBOARD 7.5X6 YLW CONV (MISCELLANEOUS) ×2 IMPLANT
SET BASIN LINEN APH (SET/KITS/TRAYS/PACK) ×2 IMPLANT
SOL PREP PROV IODINE SCRUB 4OZ (MISCELLANEOUS) IMPLANT
STRIP CLOSURE SKIN 1/2X4 (GAUZE/BANDAGES/DRESSINGS) ×2 IMPLANT
SUT MNCRL AB 4-0 PS2 18 (SUTURE) IMPLANT
SUT PROLENE 3 0 PS 1 (SUTURE) IMPLANT
SUT SILK 2 0 (SUTURE) ×1
SUT SILK 2-0 18XBRD TIE 12 (SUTURE) ×1 IMPLANT
SUT VIC AB 3-0 SH 27 (SUTURE)
SUT VIC AB 3-0 SH 27X BRD (SUTURE) IMPLANT
SYR BULB IRRIGATION 50ML (SYRINGE) ×2 IMPLANT
SYR CONTROL 10ML LL (SYRINGE) ×2 IMPLANT

## 2012-04-29 NOTE — Anesthesia Postprocedure Evaluation (Signed)
  Anesthesia Post-op Note  Patient: Adam [name]  Procedure(s) Performed: Procedure(s) (LRB) with comments: EXCISION MASS (Right) - Excision of Vascular Mass Right Arm  Patient Location: PACU  Anesthesia Type:General  Level of Consciousness: awake, alert , oriented and patient cooperative  Airway and Oxygen Therapy: Patient Spontanous Breathing  Post-op Pain: 3 /10  Post-op Assessment: Post-op Vital signs reviewed, Patient's Cardiovascular Status Stable, Respiratory Function Stable, Patent Airway, No signs of Nausea or vomiting and Pain level controlled  Post-op Vital Signs: Reviewed and stable  Complications: No apparent anesthesia complications

## 2012-04-29 NOTE — Op Note (Signed)
Patient:  Adam [name]  DOB:  08/22/1950  MRN:  [id number]   Preop Diagnosis:  Soft tissue mass right arm  Postop Diagnosis:  Vascular lesion right arm  Procedure:  Excision of vascular lesion right arm  Surgeon:  Dr. Tilford Pillar  Anes:  General via linear mask airway, 0.5% lidocaine for local  Indications:  Patient is a 61 year old male presented my office with a history of a brain tumor who noted a two-week history of increased nodule in his right arm. This is mildly tender. No noted history of trauma. On evaluation the lesion was quite mobile and it was felt that this is likely a soft tissue mass. Due to its firm nature I was somewhat suspicious of a worrisome lesion and we did opt to proceed with excision in the operating room. Risks benefits alternatives were discussed. Patient's questions and concerns are addressed the patient as consented for the planned procedure..  Procedure note:  Patient was taken to the operating room was placed in the supine position the or table time the general anesthetic is administered. A laryngeal mask airway was then placed.  The patient's right arm was then prepped with DuraPrep solution and draped in standard fashion. I created a elliptical skin incision over the palpable lesion with a 15 blade scalpel. Additional dissection down to subcuticular tissues carried out using electrocautery. This was carried out down to the fascia. At this time I noted that the nodule is palpable deep to the fascia the fascia was opened sharply. At this point identified the lesion in continued dissection noted this to be a venous structure. The lesion was approximately 1 cm wide by 2 cm in length. Initially I was suspicious that this may represent a vein thrombosis.  Control of the vessel was obtained and vessel loops were placed both proximally and distally. Some smaller side branches of the vein were ligated with 2-0 silk litigation.  Having vascular control I opted the  open mini wall the vein to further identify the lesion. Foley in to staying thrombus I encountered a pale nodular lesion. Concerned of a vascular neoplasm I opted at this time to ligate both proximally and distally. Silk suture was utilized to ligate the vessel both proximally and distally and the vessel was divided and removed en bloc. It was sent as a permanent specimen to pathology. A small venous side branch was quickly controlled using small surgical clips. Additionally I placed him clips at both the proximal and distal divided vessel for future identification. At this time the field was irrigated.  Attention was now turned to closure. Using a 3-0 Vicryl the fascia was reapproximated. A 4-0 Monocryl was then utilized reapproximate the skin edges in running subcuticular suture. The skin was washed dried moist dry towel. Benzoin is applied around incision. Half-inch are suture placed. The drapes removed patient left come out of general anesthetic. He was transferred to the PACU in stable condition to at the conclusion of the procedure all instrument, sponge, needle counts are correct. Patient tolerated procedure extremely well.  Complications:  None apparent  EBL:  Minimal  Specimen:  Vascular lesion

## 2012-04-29 NOTE — Addendum Note (Signed)
Addendum  created 04/29/12 1349 by Despina Hidden, CRNA   Modules edited:Charges VN

## 2012-04-29 NOTE — Anesthesia Preprocedure Evaluation (Signed)
Anesthesia Evaluation  Patient identified by MRN, date of birth, ID band Patient awake    Reviewed: Allergy & Precautions, H&P , NPO status , Patient's Chart, lab work & pertinent test results  Airway Mallampati: II TM Distance: >3 FB     Dental  (+) Edentulous Upper and Edentulous Lower   Pulmonary former smoker,  breath sounds clear to auscultation        Cardiovascular hypertension, Pt. on medications Rhythm:Regular Rate:Normal     Neuro/Psych PSYCHIATRIC DISORDERS Depression    GI/Hepatic GERD- (inactive now)  Medicated and Controlled,  Endo/Other    Renal/GU      Musculoskeletal   Abdominal   Peds  Hematology   Anesthesia Other Findings   Reproductive/Obstetrics                           Anesthesia Physical Anesthesia Plan  ASA: II  Anesthesia Plan: General   Post-op Pain Management:    Induction: Intravenous  Airway Management Planned: LMA  Additional Equipment:   Intra-op Plan:   Post-operative Plan: Extubation in OR  Informed Consent: I have reviewed the patients History and Physical, chart, labs and discussed the procedure including the risks, benefits and alternatives for the proposed anesthesia with the patient or authorized representative who has indicated his/her understanding and acceptance.     Plan Discussed with:   Anesthesia Plan Comments:         Anesthesia Quick Evaluation

## 2012-04-29 NOTE — Transfer of Care (Signed)
Immediate Anesthesia Transfer of Care Note  Patient: Adam [name]  Procedure(s) Performed: Procedure(s) (LRB) with comments: EXCISION MASS (Right) - Excision of Vascular Mass Right Arm  Patient Location: PACU  Anesthesia Type:General  Level of Consciousness: awake and patient cooperative  Airway & Oxygen Therapy: Patient Spontanous Breathing and Patient connected to face mask oxygen  Post-op Assessment: Report given to PACU RN, Post -op Vital signs reviewed and stable and Patient moving all extremities  Post vital signs: Reviewed and stable  Complications: No apparent anesthesia complications

## 2012-04-29 NOTE — Anesthesia Procedure Notes (Signed)
Procedure Name: LMA Insertion Date/Time: 04/29/2012 12:06 PM Performed by: Glynn Octave E Pre-anesthesia Checklist: Patient identified, Patient being monitored, Emergency Drugs available, Timeout performed and Suction available Patient Re-evaluated:Patient Re-evaluated prior to inductionOxygen Delivery Method: Circle System Utilized Preoxygenation: Pre-oxygenation with 100% oxygen Intubation Type: IV induction Ventilation: Mask ventilation without difficulty LMA: LMA inserted LMA Size: 4.0 Number of attempts: 1 Placement Confirmation: positive ETCO2 and breath sounds checked- equal and bilateral

## 2012-04-29 NOTE — H&P (Signed)
  NTS SOAP Note  Vital Signs:  Vitals as of: 04/28/2012: Systolic 145: Diastolic 84: Heart Rate 58: Temp 98.59F: Height 58ft 4in: Weight 173Lbs 0 Ounces: BMI 30  BMI : 29.7 kg/m2  Subjective: This 42 Years 2 Months old Male presents for of complaints of a nodule in his right arm. He noticed this approximately 2 weeks ago. He states possibly a slight increase in size over that period time. No similar symptomatology in the past. He has had a previous left knee cyst and a history of a brain tumor which was excised in the 90s. There is some discomfort in the area. No overlying skin changes. Patient denies any fevers or chills. No recent trauma. No infectious process involving the right upper extremity. No night sweats. No significant weight changes. No change in appetite  Review of Symptoms:  Constitutional:unremarkable   Head:unremarkable    Eyes:unremarkable   Nose/Mouth/Throat:unremarkable Cardiovascular:  unremarkable   Respiratory:unremarkable       GERD Genitourinary:unremarkable       Back pain Skin:unremarkable Breast:unremarkable   Hematolgic/Lymphatic:unremarkable     Allergic/Immunologic:unremarkable     Past Medical History:  Obtained     Past Medical History  Surgical History: brain tumor, left knee cyst excision, traumatic amputation of right hand digits Medical Problems: hypertension, GERD, hypercholesterolemia Psychiatric History: depression Allergies: sulfa Medications: enalapril, pantoprazole, Paxil, pravastatin, verapamil, doxazosin   Social History:Obtained  Social History  Preferred Language: English Ethnicity: Not Hispanic / Latino Age: 65 Years 2 Months Marital Status:  M Alcohol: occasionally/social Recreational drug(s): none   Smoking Status: Never smoker reviewed on 04/29/2012 Functional Status reviewed on mm/dd/yyyy ------------------------------------------------ Bathing: Normal Cooking:  Normal Dressing: Normal Driving: Normal Eating: Normal Managing Meds: Normal Oral Care: Normal Shopping: Normal Toileting: Normal Transferring: Normal Walking: Normal Cognitive Status reviewed on mm/dd/yyyy ------------------------------------------------ Attention: Normal Decision Making: Normal Language: Normal Memory: Normal Motor: Normal Perception: Normal Problem Solving: Normal Visual and Spatial: Normal   Family History:Obtained     Family History  Is there a family history [id number]    Objective Information: General:  Well appearing, well nourished in no distress. Skin:     no rash or prominent lesions Head:Atraumatic; no masses; no abnormalities Eyes:  conjunctiva clear, EOM intact, PERRL Mouth:  Mucous membranes moist, no mucosal lesions. Throat:  no erythema, exudates or lesions. Neck:  Supple without lymphadenopathy.  Heart:  RRR, no murmur Lungs:    CTA bilaterally, no wheezes, rhonchi, rales.  Breathing unlabored. Abdomen:Soft, NT/ND, no HSM, no masses. Extremities:  No deformities, clubbing, cyanosis, or edema.  there is a palpable mobile nontender nodule in the right medial arm. This does not appear to be attached to underlying musculature. It is somewhat rubbery but is noted to be firm. it is approximately 2 cm. It is mostly symmetrical. Also to note patient has traumatic and occasions of his digits on that extremity.     Assessment:    Plan: soft tissue mass of the right arm. Options were discussed. Surgical excision was also discussed. Patient does wish to proceed. At this time given patient's history and the current findings I do think this is reasonable we will plan to schedule the patient as soon as possible.  Patient Education:Alternative treatments to surgery were discussed with patient (and family).  Risks and benefits  of procedure were fully explained to the patient (and family) who gave  informed consent. Patient/family questions were addressed.  Follow-up:Pending Surgery

## 2012-04-29 NOTE — Interval H&P Note (Signed)
History and Physical Interval Note:  04/29/2012 9:35 AM  Adam [name]  has presented today for surgery, with the diagnosis of Mass Right Arm  The various methods of treatment have been discussed with the patient and family. After consideration of risks, benefits and other options for treatment, the patient has consented to  Procedure(s) (LRB) with comments: EXCISION MASS (Right) as a surgical intervention .  The patient's history has been reviewed, patient examined, no change in status, stable for surgery.  I have reviewed the patient's chart and labs.  Questions were answered to the patient's satisfaction.     Judi Jaffe C

## 2012-05-02 ENCOUNTER — Telehealth: Payer: Self-pay | Admitting: Gastroenterology

## 2012-05-02 ENCOUNTER — Other Ambulatory Visit: Payer: Self-pay | Admitting: Gastroenterology

## 2012-05-02 DIAGNOSIS — C966 Unifocal Langerhans-cell histiocytosis: Secondary | ICD-10-CM

## 2012-05-02 NOTE — Telephone Encounter (Signed)
Patient is scheduled for EGD on 12/13 at 10:30 I have mailed him instructions and I have LMOM for him to return my call to confirm

## 2012-05-02 NOTE — Telephone Encounter (Signed)
Message copied by Glendora Score on Mon May 02, 2012  2:44 PM ------      Message from: [name]      Created: Fri Apr 29, 2012  2:33 PM       Needs EGD WITHIN THE FIRST TWO WEEKS DEC 2013, DX: EOSINOPHILIC GRANULOMA.            DON'T KNOW WHY HE DIDN'T GET A CALL BACK.

## 2012-05-04 ENCOUNTER — Encounter (HOSPITAL_COMMUNITY): Payer: Self-pay | Admitting: General Surgery

## 2012-05-11 ENCOUNTER — Encounter (HOSPITAL_COMMUNITY): Payer: Self-pay

## 2012-05-11 ENCOUNTER — Emergency Department (HOSPITAL_COMMUNITY): Payer: BC Managed Care – PPO

## 2012-05-11 ENCOUNTER — Emergency Department (HOSPITAL_COMMUNITY)
Admission: EM | Admit: 2012-05-11 | Discharge: 2012-05-12 | Disposition: A | Discharge status: Home / Self Care | Payer: BC Managed Care – PPO | Attending: Emergency Medicine | Admitting: Emergency Medicine

## 2012-05-11 DIAGNOSIS — Z862 Personal history of diseases of the blood and blood-forming organs and certain disorders involving the immune mechanism: Secondary | ICD-10-CM | POA: Insufficient documentation

## 2012-05-11 DIAGNOSIS — Z79899 Other long term (current) drug therapy: Secondary | ICD-10-CM | POA: Insufficient documentation

## 2012-05-11 DIAGNOSIS — Z8639 Personal history of other endocrine, nutritional and metabolic disease: Secondary | ICD-10-CM | POA: Insufficient documentation

## 2012-05-11 DIAGNOSIS — F329 Major depressive disorder, single episode, unspecified: Secondary | ICD-10-CM | POA: Insufficient documentation

## 2012-05-11 DIAGNOSIS — M7989 Other specified soft tissue disorders: Secondary | ICD-10-CM

## 2012-05-11 DIAGNOSIS — Z9889 Other specified postprocedural states: Secondary | ICD-10-CM | POA: Insufficient documentation

## 2012-05-11 DIAGNOSIS — Z87891 Personal history of nicotine dependence: Secondary | ICD-10-CM | POA: Insufficient documentation

## 2012-05-11 DIAGNOSIS — I10 Essential (primary) hypertension: Secondary | ICD-10-CM | POA: Insufficient documentation

## 2012-05-11 DIAGNOSIS — M79609 Pain in unspecified limb: Secondary | ICD-10-CM | POA: Insufficient documentation

## 2012-05-11 DIAGNOSIS — F3289 Other specified depressive episodes: Secondary | ICD-10-CM | POA: Insufficient documentation

## 2012-05-11 DIAGNOSIS — R229 Localized swelling, mass and lump, unspecified: Secondary | ICD-10-CM | POA: Diagnosis not present

## 2012-05-11 DIAGNOSIS — E785 Hyperlipidemia, unspecified: Secondary | ICD-10-CM | POA: Insufficient documentation

## 2012-05-11 DIAGNOSIS — Z6830 Body mass index (BMI) 30.0-30.9, adult: Secondary | ICD-10-CM | POA: Insufficient documentation

## 2012-05-11 DIAGNOSIS — K219 Gastro-esophageal reflux disease without esophagitis: Secondary | ICD-10-CM | POA: Insufficient documentation

## 2012-05-11 NOTE — ED Notes (Addendum)
Pt c/o right arm pain that started on Monday. Pt has incision to right bicep area where he had something removed but does not know what it was.

## 2012-05-11 NOTE — ED Provider Notes (Signed)
History    This chart was scribed for Sunnie Nielsen, MD, MD by Smitty Pluck, ED Scribe. The patient was seen in room APA09 and the patient's care was started at 11:31PM.   CSN: [id number]  Arrival date & time 05/11/12  2232      Chief Complaint  Patient presents with  . Extremity Pain    (Consider location/radiation/quality/duration/timing/severity/associated sxs/prior treatment) Patient is a 61 y.o. male presenting with extremity pain. The history is provided by the patient. No language interpreter was used.  Extremity Pain This is a new problem. The current episode started 2 days ago. The problem occurs constantly. The problem has not changed since onset.He has tried nothing for the symptoms.   Adam [name] is a 61 y.o. male who presents to the Emergency Department complaining of constant, moderate sharp right arm pain and swelling onset 2 days ago. Pt reports having surgery on arm to remove mass from right forearm 04-29-12 by Dr. Leticia Penna. Pt denies fevers, vomiting, rash, abdominal pain and any other pain.   Past Medical History  Diagnosis Date  . Eosinophilic granuloma JAN 2011 GASTRIC, followed by Dr. [name]    EGD JAN 2011, JUN 2011, NOV 2011  . HTN (hypertension)   . Hyperlipemia   . Depression   . BMI 31.0-31.9,adult JUNE 2011 180 LBS   . GERD (gastroesophageal reflux disease)     Past Surgical History  Procedure Date  . Hand surgery at the age of 41    Brother accidentally cut it  . Ganglion cyst excision     from L wrist and pins placed for Fx involving L thumb  . Back surgery   . Tumor removal 1994    Benign from brain at [facility]  . Cyst removed     from Left leg and knee  . Upper gastrointestinal endoscopy JAN 2011    GASTRIC ULCER-EOS GRANULOMA  . Upper gastrointestinal endoscopy JUN 2011    GASTRIC ULCER- EOS, NO H. PYLORI  . Upper gastrointestinal endoscopy NOV 2011    GASTRIC NODULE, no ulcer- EOS  . Colonoscopy JAN 2011 COMPLETE   hypoTN and bradycardia w/ Demerol ,Phenergan and Versed, [place] TICS, SML IH  . Colonoscopy 2003    NUR-normal  . Colonoscopy 06/21/09    Fields-sigmoid diverticulosis, sm int hemorrhoids  . Mass excision 04/29/2012    Procedure: EXCISION MASS;  Surgeon: [name], MD;  Location: [facility];  Service: General;  Laterality: Right;  Excision of Vascular Mass Right Arm    Family History  Problem Relation Age of Onset  . Colon cancer Maternal Grandmother     OVER 60    History  Substance Use Topics  . Smoking status: Former Smoker -- 0.2 packs/day for 15 years    Types: Cigars    Quit date: 04/29/2002  . Smokeless tobacco: Not on file  . Alcohol Use: Yes     Comment: mixed drink 1-2 per mo      Review of Systems  All other systems reviewed and are negative.    Allergies  Sulfonamide derivatives  Home Medications   Current Outpatient Rx  Name  Route  Sig  Dispense  Refill  . DOXAZOSIN MESYLATE 8 MG PO TABS   Oral   Take 8 mg by mouth at bedtime.           . ENALAPRIL MALEATE 20 MG PO TABS   Oral   Take 20 mg by mouth daily.           Marland Kitchen  HYDROCODONE-ACETAMINOPHEN 5-325 MG PO TABS   Oral   Take 1-2 tablets by mouth every 4 (four) hours as needed for pain.   25 tablet   0   . PANTOPRAZOLE SODIUM 40 MG PO TBEC   Oral   Take 1 tablet (40 mg total) by mouth daily.   30 tablet   11   . PAROXETINE HCL 20 MG PO TABS   Oral   Take 20 mg by mouth daily.           Marland Kitchen PRAVASTATIN SODIUM 20 MG PO TABS   Oral   Take 20 mg by mouth at bedtime.           Marland Kitchen VERAPAMIL HCL ER 180 MG PO CP24   Oral   Take 180 mg by mouth daily.             BP 148/87  Pulse 64  Temp 98.3 F (36.8 C) (Oral)  Resp 20  Ht 5\' 4"  (1.626 m)  Wt 173 lb (78.472 kg)  BMI 29.70 kg/m2  SpO2 96%  Physical Exam  Nursing note and vitals reviewed. Constitutional: He is oriented to person, place, and time. He appears well-developed and well-nourished. No distress.  HENT:  Head:  Normocephalic and atraumatic.  Eyes: Conjunctivae normal are normal.  Neck: Normal range of motion. Neck supple. No tracheal deviation present.  Cardiovascular: Normal rate, regular rhythm and normal heart sounds.   Pulmonary/Chest: Effort normal and breath sounds normal. No respiratory distress.  Abdominal: Soft. He exhibits no distension.  Musculoskeletal: Normal range of motion.       Distal humerus of right arm on volar aspect has healing wound with surrounding edema and fullness that extends to mid forearm with mild tenderness.  No erythema,  no increased warmth  distal neurovascular intact  Neurological: He is alert and oriented to person, place, and time.  Skin: Skin is warm and dry. No rash noted.  Psychiatric: He has a normal mood and affect. His behavior is normal.    ED Course  Procedures (including critical care time) DIAGNOSTIC STUDIES: Oxygen Saturation is 96% on room air, normal by my interpretation.    COORDINATION OF CARE: 11:33 PM Discussed ED treatment with pt   Results for orders placed during the hospital encounter of 05/11/12  CBC      Component Value Range   WBC 7.7  4.0 - 10.5 K/uL   RBC 5.20  4.22 - 5.81 MIL/uL   Hemoglobin 15.3  13.0 - 17.0 g/dL   HCT 16.1  09.6 - 04.5 %   MCV 82.3  78.0 - 100.0 fL   MCH 29.4  26.0 - 34.0 pg   MCHC 35.7  30.0 - 36.0 g/dL   RDW 40.9  81.1 - 91.4 %   Platelets 300  150 - 400 K/uL  BASIC METABOLIC PANEL      Component Value Range   Sodium 137  135 - 145 mEq/L   Potassium 3.8  3.5 - 5.1 mEq/L   Chloride 101  96 - 112 mEq/L   CO2 27  19 - 32 mEq/L   Glucose, Bld 92  70 - 99 mg/dL   BUN 13  6 - 23 mg/dL   Creatinine, Ser 7.82  0.50 - 1.35 mg/dL   Calcium 9.6  8.4 - 95.6 mg/dL   GFR calc non Af Amer 65 (*) >90 mL/min   GFR calc Af Amer 75 (*) >90 mL/min      pt declines  any pain medications. Old records reviewed. No previous imaging of this right arm mass available. I initially ordered a CT scan to further  evaluate and radiologist recommended an MRI tomorrow as an outpatient. Labs reviewed as above.  12:57 AM case discussed with general surgeon on call Dr. Lovell Sheehan. He tells me that Dr. Leticia Penna is available in the office this morning and as arm does not look infected, ( No leukocytosis. No fevers.) Plan followup in clinic tomorrow for further evaluation. Patient is agreeable to this plan.   MDM  Right arm mass and swelling at postop site the patient tells me a similar to his initial presentation when he had a cyst removed.  No warmth, erythema, fever, or leukocytosis. CT scan initially ordered and radiologist is recommending MRI tomorrow as an outpatient, versus CT now. General surgery consult obtained. Patient has close outpatient followup available today and decision made to discharge home to see Dr. Leticia Penna today    I personally performed the services described in this documentation, which was scribed in my presence. The recorded information has been reviewed and is accurate.     Sunnie Nielsen, MD 05/12/12 [phone]

## 2012-05-12 ENCOUNTER — Ambulatory Visit (HOSPITAL_COMMUNITY): Payer: BC Managed Care – PPO

## 2012-05-12 LAB — CBC
HCT: 42.8 % (ref 39.0–52.0)
MCHC: 35.7 g/dL (ref 30.0–36.0)
MCV: 82.3 fL (ref 78.0–100.0)
Platelets: 300 10*3/uL (ref 150–400)
RDW: 13.3 % (ref 11.5–15.5)

## 2012-05-12 LAB — BASIC METABOLIC PANEL
BUN: 13 mg/dL (ref 6–23)
Creatinine, Ser: 1.17 mg/dL (ref 0.50–1.35)
GFR calc Af Amer: 75 mL/min — ABNORMAL LOW (ref >90)
GFR calc non Af Amer: 65 mL/min — ABNORMAL LOW (ref >90)
Potassium: 3.8 mEq/L (ref 3.5–5.1)

## 2012-05-13 ENCOUNTER — Encounter (HOSPITAL_COMMUNITY): Payer: Self-pay | Admitting: Pharmacy Technician

## 2012-05-19 MED ORDER — SODIUM CHLORIDE 0.45 % IV SOLN
INTRAVENOUS | Status: DC
  Administered 2012-05-20: 10:00:00 via INTRAVENOUS

## 2012-05-20 ENCOUNTER — Encounter (HOSPITAL_COMMUNITY): Payer: Self-pay | Admitting: *Deleted

## 2012-05-20 ENCOUNTER — Encounter (HOSPITAL_COMMUNITY)
Admission: RE | Disposition: A | Discharge status: Home / Self Care | Payer: Self-pay | Source: Ambulatory Visit | Attending: Gastroenterology

## 2012-05-20 ENCOUNTER — Ambulatory Visit (HOSPITAL_COMMUNITY)
Admission: RE | Admit: 2012-05-20 | Discharge: 2012-05-20 | Disposition: A | Discharge status: Home / Self Care | Payer: BC Managed Care – PPO | Source: Ambulatory Visit | Attending: Gastroenterology | Admitting: Gastroenterology

## 2012-05-20 DIAGNOSIS — K259 Gastric ulcer, unspecified as acute or chronic, without hemorrhage or perforation: Secondary | ICD-10-CM | POA: Insufficient documentation

## 2012-05-20 DIAGNOSIS — K209 Esophagitis, unspecified without bleeding: Secondary | ICD-10-CM

## 2012-05-20 DIAGNOSIS — I10 Essential (primary) hypertension: Secondary | ICD-10-CM | POA: Insufficient documentation

## 2012-05-20 DIAGNOSIS — Z86010 Personal history of colonic polyps: Secondary | ICD-10-CM

## 2012-05-20 DIAGNOSIS — C966 Unifocal Langerhans-cell histiocytosis: Secondary | ICD-10-CM

## 2012-05-20 HISTORY — PX: ESOPHAGOGASTRODUODENOSCOPY: SHX5428

## 2012-05-20 SURGERY — EGD (ESOPHAGOGASTRODUODENOSCOPY)
Anesthesia: Moderate Sedation

## 2012-05-20 MED ORDER — MEPERIDINE HCL 100 MG/ML IJ SOLN
INTRAMUSCULAR | Status: DC | PRN
  Administered 2012-05-20: 25 mg via INTRAVENOUS

## 2012-05-20 MED ORDER — BUTAMBEN-TETRACAINE-BENZOCAINE 2-2-14 % EX AERO
INHALATION_SPRAY | CUTANEOUS | Status: DC | PRN
  Administered 2012-05-20: 2 via TOPICAL

## 2012-05-20 MED ORDER — STERILE WATER FOR IRRIGATION IR SOLN
Status: DC | PRN
  Administered 2012-05-20: 10:00:00

## 2012-05-20 MED ORDER — PANTOPRAZOLE SODIUM 40 MG PO TBEC: 40.0000 mg | DELAYED_RELEASE_TABLET | Freq: Two times a day (BID) | ORAL | Status: DC

## 2012-05-20 MED ORDER — MIDAZOLAM HCL 5 MG/5ML IJ SOLN
INTRAMUSCULAR | Status: DC
Start: 2012-05-20 — End: 2012-05-20
  Filled 2012-05-20: qty 10

## 2012-05-20 MED ORDER — MEPERIDINE HCL 100 MG/ML IJ SOLN
INTRAMUSCULAR | Status: AC
  Filled 2012-05-20: qty 1

## 2012-05-20 MED ORDER — MIDAZOLAM HCL 5 MG/5ML IJ SOLN
INTRAMUSCULAR | Status: DC | PRN
  Administered 2012-05-20: 1 mg via INTRAVENOUS
  Administered 2012-05-20: 2 mg via INTRAVENOUS

## 2012-05-20 NOTE — Op Note (Addendum)
[facility] [address] [place], [address]   ENDOSCOPY PROCEDURE REPORT  PATIENT: Adam, [name]  MR#: [id number] BIRTHDATE: 1950-06-23 , 61  yrs. old GENDER: Male  ENDOSCOPIST: Jonette Eva, MD REFERRED [id number] Gerda Diss, M.D., Rise Mu, M.D.-[facility]  PROCEDURE DATE: 05/20/2012 PROCEDURE:   EGD w/ biopsy  INDICATIONS:   PMHX: EOSINOPHILIC ULCER ON GREATER CURVATURE OF THE STOMACH. RX FOR H PYLORI. LAST EGD NOV 2011-RESOLVING ULCER BX WITH EOSINOPHILIC INFILTRA. PRESENTS FOR SURVEILLANCE OM ASA AND PROTONIX DAILY.  MEDICATIONS: Demerol 25 mg IV and Versed 3 mg IV TOPICAL ANESTHETIC:   Cetacaine Spray  DESCRIPTION OF PROCEDURE:     Physical exam was performed.  Informed consent was obtained from the patient after explaining the benefits, risks, and alternatives to the procedure.  The patient was connected to the monitor and placed in the left lateral position.  Continuous oxygen was provided by nasal cannula and IV medicine administered through an indwelling cannula.  After administration of sedation, the patients esophagus was intubated and the EG-[id number] ([id number])  endoscope was advanced under direct visualization to the second portion of the duodenum.  The scope was removed slowly by carefully examining the color, texture, anatomy, and integrity of the mucosa on the way out.  The patient was recovered in endoscopy and discharged home in satisfactory condition.    ESOPHAGUS: MILD ESOPHAGITIS.  NO BARRETT'S.  STOMACH: Multiple punctate, shallow and clean-based ulcers ranging between 3-5 mm in size were found in the gastric antrum and at the angularis.  Biopsies were taken.   NORMAL STOMACH PROXIMAL TO THE ANGULARIS.  DUODENUM: Mild duodenal inflammation was found in the duodenal bulb. The duodenal mucosa showed no abnormalities in the 2nd part of the duodenum.  NO EVIDENCE FOR RESIDUAL ULCER ON GREATER CURVATURE OF  STOMACH.  COMPLICATIONS:   None  ENDOSCOPIC IMPRESSION: 1.   MILD ESOPHAGITIS.  NO BARRETT'S. 2.   Multiple ulcers ranging between 3-5 mm in size were found in the gastric antrum and at the angularis 3.   MILD Duodenal inflammation was found in the duodenal bulb 4.   The duodenal mucosa showed no abnormalities in the 2nd part of the duodenum 5.   NO EVIDENCE FOR RESIDUAL ULCER ON GREATER CURVATURE OF STOMACH  RECOMMENDATIONS: INCREASE PROTONIX TO 30 MINUTES PRIOR TO MEALS BID FOR 3 MOS THEN DAILY NO ASA OR NSAIDS FOR 2 WEEKS. LOW FAT DIET AWAIT BIOPSY REPEAT EGD IN 3 MOS. OPV IN 6 MOS   REPEAT EXAM:   _______________________________ Rosalie DoctorJonette Eva, MD 05/20/2012 11:04 AM Revised: 05/20/2012 11:04 AM      PATIENT NAME:  Adam, [name] MR#: [id number]

## 2012-05-20 NOTE — H&P (Signed)
Primary Care Physician:  Harlow Asa, MD Primary Gastroenterologist:  Dr. Darrick Penna  Pre-Procedure History & Physical: HPI:  Adam [name] is a 61 y.o. male here for Eosinophilic granuloma.   Past Medical History  Diagnosis Date  . Eosinophilic granuloma JAN 2011 GASTRIC, followed by Dr. [name]    EGD JAN 2011, JUN 2011, NOV 2011  . HTN (hypertension)   . Hyperlipemia   . Depression   . BMI 31.0-31.9,adult JUNE 2011 180 LBS   . GERD (gastroesophageal reflux disease)     Past Surgical History  Procedure Date  . Hand surgery at the age of 30    Brother accidentally cut it  . Ganglion cyst excision     from L wrist and pins placed for Fx involving L thumb  . Back surgery   . Tumor removal 1994    Benign [facility] brain at [facility]  . Cyst removed     from Left leg and knee  . Upper gastrointestinal endoscopy JAN 2011    GASTRIC ULCER-EOS GRANULOMA  . Upper gastrointestinal endoscopy JUN 2011    GASTRIC ULCER- EOS, NO H. PYLORI  . Upper gastrointestinal endoscopy NOV 2011    GASTRIC NODULE, no ulcer- EOS  . Colonoscopy JAN 2011 COMPLETE    hypoTN and bradycardia w/ Demerol ,Phenergan and Versed, [place] TICS, SML IH  . Colonoscopy 2003    NUR-normal  . Colonoscopy 06/21/09    Shawnae Leiva-sigmoid diverticulosis, sm int hemorrhoids  . Mass excision 04/29/2012    Procedure: EXCISION MASS;  Surgeon: [name], MD;  Location: [facility];  Service: General;  Laterality: Right;  Excision of Vascular Mass Right Arm    Prior to Admission medications   Medication Sig Start Date End Date Taking? Authorizing Provider  aspirin EC 81 MG tablet Take 81 mg by mouth daily.   Yes Historical Provider, MD  doxazosin (CARDURA) 8 MG tablet Take 8 mg by mouth at bedtime.     Yes Historical Provider, MD  enalapril (VASOTEC) 20 MG tablet Take 20 mg by mouth daily.     Yes Historical Provider, MD  pantoprazole (PROTONIX) 40 MG tablet Take 1 tablet (40 mg total) by mouth daily. 12/30/11  Yes Joselyn Arrow, NP  PARoxetine (PAXIL) 20 MG tablet Take 20 mg by mouth daily.     Yes Historical Provider, MD  pravastatin (PRAVACHOL) 20 MG tablet Take 20 mg by mouth at bedtime.     Yes Historical Provider, MD  verapamil (VERELAN PM) 180 MG 24 hr capsule Take 180 mg by mouth daily.     Yes Historical Provider, MD    Allergies as of 05/02/2012 - Review Complete 04/29/2012  Allergen Reaction Noted  . Sulfonamide derivatives      Family History  Problem Relation Age of Onset  . Colon cancer Maternal Grandmother     OVER 60    History   Social History  . Marital Status: Married    Spouse Name: N/A    Number of Children: 1  . Years of Education: N/A   Occupational History  . disabled    Social History Main Topics  . Smoking status: Former Smoker -- 0.2 packs/day for 15 years    Types: Cigars    Quit date: 04/29/2002  . Smokeless tobacco: Not on file  . Alcohol Use: Yes     Comment: mixed drink 1-2 per mo  . Drug Use: No  . Sexually Active: Yes    Birth Control/  Protection: None   Other Topics Concern  . Not on file   Social History Narrative   Lives w/ wife    Review of Systems: See HPI, otherwise negative ROS   Physical Exam: BP 145/94  Pulse 50  Temp 98.5 F (36.9 C) (Oral)  Resp 20  Ht 5\' 4"  (1.626 m)  Wt 173 lb (78.472 kg)  BMI 29.70 kg/m2  SpO2 97% General:   Alert,  pleasant and cooperative in NAD Head:  Normocephalic and atraumatic. Neck:  Supple; Lungs:  Clear throughout to auscultation.    Heart:  Regular rate and rhythm. Abdomen:  Soft, nontender and nondistended. Normal bowel sounds, without guarding, and without rebound.   Neurologic:  Alert and  oriented x4;  grossly normal neurologically.  Impression/Plan:     Eosinophilic granuloma-needs surveillance  PLAN: 1. EGD TODAY

## 2012-05-24 ENCOUNTER — Encounter: Payer: Self-pay | Admitting: Gastroenterology

## 2012-05-24 ENCOUNTER — Encounter (HOSPITAL_COMMUNITY): Payer: Self-pay | Admitting: Gastroenterology

## 2012-05-24 ENCOUNTER — Telehealth: Payer: Self-pay | Admitting: Gastroenterology

## 2012-05-24 NOTE — Telephone Encounter (Signed)
Reminder in epic to follow up in Dec 2015 with [facility]

## 2012-05-24 NOTE — Telephone Encounter (Signed)
Called and informed pt. He said he is supposed to follow up in one year  .

## 2012-05-24 NOTE — Telephone Encounter (Signed)
Please call pt. His stomach Bx shows gastritis DUE TO ASPIRIN USE. Continue PROTONIX DAILY FOREVER. OPV IN DEC 2015.

## 2012-05-24 NOTE — Telephone Encounter (Signed)
Pt may follow up in dec 2014. PLEASE CALL PT & LET HIM KNOW.

## 2012-05-24 NOTE — Telephone Encounter (Signed)
A user error has taken place.

## 2012-05-24 NOTE — Telephone Encounter (Signed)
Path faxed to Dr Gerda Diss and Dr [name]

## 2012-05-25 ENCOUNTER — Telehealth: Payer: Self-pay | Admitting: *Deleted

## 2012-05-25 NOTE — Telephone Encounter (Signed)
Called. Many rings and no answer.  

## 2012-05-25 NOTE — Telephone Encounter (Signed)
Pt aware to follow up in 1 year.

## 2012-05-25 NOTE — Telephone Encounter (Signed)
Done

## 2012-05-25 NOTE — Telephone Encounter (Signed)
Mr Orlich called back, returning your call. Please call him. Thanks.

## 2012-08-05 ENCOUNTER — Encounter: Payer: Self-pay | Admitting: *Deleted

## 2012-08-29 ENCOUNTER — Encounter: Payer: Self-pay | Admitting: Gastroenterology

## 2012-08-31 ENCOUNTER — Encounter (HOSPITAL_COMMUNITY): Payer: Self-pay | Admitting: Pharmacy Technician

## 2012-08-31 ENCOUNTER — Encounter: Payer: Self-pay | Admitting: Gastroenterology

## 2012-08-31 ENCOUNTER — Ambulatory Visit (INDEPENDENT_AMBULATORY_CARE_PROVIDER_SITE_OTHER): Payer: BC Managed Care – PPO | Admitting: Gastroenterology

## 2012-08-31 VITALS — BP 129/78 | HR 68 | Temp 97.2°F | Ht 64.0 in | Wt 179.6 lb

## 2012-08-31 DIAGNOSIS — K259 Gastric ulcer, unspecified as acute or chronic, without hemorrhage or perforation: Secondary | ICD-10-CM | POA: Diagnosis not present

## 2012-08-31 HISTORY — DX: Gastric ulcer, unspecified as acute or chronic, without hemorrhage or perforation: K25.9

## 2012-08-31 NOTE — Progress Notes (Signed)
Primary Care Physician:  Harlow Asa, MD  Primary Gastroenterologist:  Jonette Eva, MD   Chief Complaint  Patient presents with  . EGD    HPI:  Adam [name] is a 62 y.o. male here to schedule his 3 month f/u EGD for multiple ulcers found in gastric antrum. Bx showed benign findings without h.pylori. He has h/o eosinophilic ulcer on greater curvature of the stomach but on last EGD it had completely healed.   Patient denies abdominal pain, GERD, constipation, diarrhea, melena, brbpr, heartburn. He is taking Protonix BID. No NSAIDS/ASA.   Current Outpatient Prescriptions  Medication Sig Dispense Refill  . doxazosin (CARDURA) 8 MG tablet Take 8 mg by mouth at bedtime.        . enalapril (VASOTEC) 20 MG tablet Take 20 mg by mouth daily.        . pantoprazole (PROTONIX) 40 MG tablet Take 1 tablet (40 mg total) by mouth 2 (two) times daily.  62 tablet  11  . PARoxetine (PAXIL) 20 MG tablet Take 20 mg by mouth daily.        . pravastatin (PRAVACHOL) 20 MG tablet Take 20 mg by mouth at bedtime.        . verapamil (VERELAN PM) 180 MG 24 hr capsule Take 180 mg by mouth daily.         No current facility-administered medications for this visit.    Allergies as of 08/31/2012 - Review Complete 08/31/2012  Allergen Reaction Noted  . Sulfonamide derivatives      Past Medical History  Diagnosis Date  . Eosinophilic granuloma JAN 2011 GASTRIC, followed by Dr. [name]    EGD JAN 2011, JUN 2011, NOV 2011  . HTN (hypertension)   . Hyperlipemia   . Depression   . BMI 31.0-31.9,adult JUNE 2011 180 LBS   . GERD (gastroesophageal reflux disease)     Past Surgical History  Procedure Laterality Date  . Hand surgery  at the age of 44    Brother accidentally cut it  . Ganglion cyst excision      from L wrist and pins placed for Fx involving L thumb  . Back surgery    . Tumor removal  1994    Benign from brain at [facility]  . Cyst removed      from Left leg and knee  . Upper  gastrointestinal endoscopy  JAN 2011    GASTRIC ULCER-EOS GRANULOMA  . Upper gastrointestinal endoscopy  JUN 2011    GASTRIC ULCER- EOS, NO H. PYLORI  . Upper gastrointestinal endoscopy  NOV 2011    GASTRIC NODULE, no ulcer- EOS  . Colonoscopy  JAN 2011 COMPLETE    hypoTN and bradycardia w/ Demerol ,Phenergan and Versed, [place] TICS, SML IH  . Colonoscopy  2003    NUR-normal  . Colonoscopy  06/21/09    Fields-sigmoid diverticulosis, sm int hemorrhoids  . Mass excision  04/29/2012    Procedure: EXCISION MASS;  Surgeon: [name], MD;  Location: [facility];  Service: General;  Laterality: Right;  Excision of Vascular Mass Right Arm  . Esophagogastroduodenoscopy  05/20/2012    SLF: MILD ESOPHAGITIS.NO BARRETT'S/Multiple ulcers ranging between 3-5 mm/MILD Duodenal inflammation was found in the duodenal bulb    Family History  Problem Relation Age of Onset  . Colon cancer Maternal Grandmother     OVER 60    History   Social History  . Marital Status: Married    Spouse Name: N/A  Number of Children: 1  . Years of Education: N/A   Occupational History  . disabled    Social History Main Topics  . Smoking status: Former Smoker -- 0.25 packs/day for 15 years    Types: Cigars    Quit date: 04/29/2002  . Smokeless tobacco: Not on file  . Alcohol Use: Yes     Comment: mixed drink 1-2 per mo  . Drug Use: No  . Sexually Active: Yes    Birth Control/ Protection: None   Other Topics Concern  . Not on file   Social History Narrative   Lives w/ wife      ROS:  General: Negative for anorexia, weight loss, fever, chills, fatigue, weakness. Eyes: Negative for vision changes.  ENT: Negative for hoarseness, difficulty swallowing , nasal congestion. CV: Negative for chest pain, angina, palpitations, dyspnea on exertion, peripheral edema.  Respiratory: Negative for dyspnea at rest, dyspnea on exertion, cough, sputum, wheezing.  GI: See history of present illness. GU:  Negative for  dysuria, hematuria, urinary incontinence, urinary frequency, nocturnal urination.  MS: Negative for joint pain, low back pain.  Derm: Negative for rash or itching.  Neuro: Negative for weakness, abnormal sensation, seizure, frequent headaches, memory loss, confusion.  Psych: Negative for anxiety, depression, suicidal ideation, hallucinations.  Endo: Negative for unusual weight change.  Heme: Negative for bruising or bleeding. Allergy: Negative for rash or hives.    Physical Examination:  BP 129/78  Pulse 68  Temp(Src) 97.2 F (36.2 C) (Oral)  Ht 5\' 4"  (1.626 m)  Wt 179 lb 9.6 oz (81.466 kg)  BMI 30.81 kg/m2   General: Well-nourished, well-developed in no acute distress.  Head: Normocephalic, atraumatic.   Eyes: Conjunctiva pink, no icterus. Mouth: Oropharyngeal mucosa moist and pink , no lesions erythema or exudate. Neck: Supple without thyromegaly, masses, or lymphadenopathy.  Lungs: Clear to auscultation bilaterally.  Heart: Regular rate and rhythm, no murmurs rubs or gallops.  Abdomen: Bowel sounds are normal, nontender, nondistended, no hepatosplenomegaly or masses, no abdominal bruits or hernia , no rebound or guarding.   Rectal: not performed Extremities: No lower extremity edema. No clubbing or deformities.  Neuro: Alert and oriented x 4 , grossly normal neurologically.  Skin: Warm and dry, no rash or jaundice.   Psych: Alert and cooperative, normal mood and affect.    Imaging Studies: No results found.

## 2012-08-31 NOTE — Patient Instructions (Addendum)
We have scheduled you for an upper endoscopy with Dr. Darrick Penna. Please see separate instructions. Continue protonix twice daily until further instructions after your procedure.

## 2012-09-02 ENCOUNTER — Encounter: Payer: Self-pay | Admitting: Gastroenterology

## 2012-09-02 NOTE — Assessment & Plan Note (Signed)
Patient with h/o eosinophilic ulcer on greater curvature of the stomach in 2011 on multiple EGDs. He had surveillance EGD in 05/2012, the ulcer had healed but he had additional small ulcers in the gastric antrum and at the angularis. No H.Pylori. He was advised to have 3 month f/u EGD. Currently patient is asymptomatic. He remains on PPI BID. EGD in near future.  I have discussed the risks, alternatives, benefits with regards to but not limited to the risk of reaction to medication, bleeding, infection, perforation and the patient is agreeable to proceed. Written consent to be obtained.

## 2012-09-05 ENCOUNTER — Encounter (HOSPITAL_COMMUNITY)
Admission: RE | Disposition: A | Discharge status: Home / Self Care | Payer: Self-pay | Source: Ambulatory Visit | Attending: Gastroenterology

## 2012-09-05 ENCOUNTER — Ambulatory Visit (HOSPITAL_COMMUNITY)
Admission: RE | Admit: 2012-09-05 | Discharge: 2012-09-05 | Disposition: A | Discharge status: Home / Self Care | Payer: BC Managed Care – PPO | Source: Ambulatory Visit | Attending: Gastroenterology | Admitting: Gastroenterology

## 2012-09-05 ENCOUNTER — Encounter (HOSPITAL_COMMUNITY): Payer: Self-pay | Admitting: *Deleted

## 2012-09-05 DIAGNOSIS — K2940 Chronic atrophic gastritis without bleeding: Secondary | ICD-10-CM | POA: Insufficient documentation

## 2012-09-05 DIAGNOSIS — K2990 Gastroduodenitis, unspecified, without bleeding: Secondary | ICD-10-CM

## 2012-09-05 DIAGNOSIS — K2970 Gastritis, unspecified, without bleeding: Secondary | ICD-10-CM

## 2012-09-05 DIAGNOSIS — I10 Essential (primary) hypertension: Secondary | ICD-10-CM | POA: Insufficient documentation

## 2012-09-05 DIAGNOSIS — K279 Peptic ulcer, site unspecified, unspecified as acute or chronic, without hemorrhage or perforation: Secondary | ICD-10-CM | POA: Insufficient documentation

## 2012-09-05 DIAGNOSIS — K259 Gastric ulcer, unspecified as acute or chronic, without hemorrhage or perforation: Secondary | ICD-10-CM

## 2012-09-05 DIAGNOSIS — Z8711 Personal history of peptic ulcer disease: Secondary | ICD-10-CM

## 2012-09-05 HISTORY — PX: ESOPHAGOGASTRODUODENOSCOPY: SHX5428

## 2012-09-05 SURGERY — EGD (ESOPHAGOGASTRODUODENOSCOPY)
Anesthesia: Moderate Sedation

## 2012-09-05 MED ORDER — MIDAZOLAM HCL 5 MG/5ML IJ SOLN
INTRAMUSCULAR | Status: AC
  Filled 2012-09-05: qty 10

## 2012-09-05 MED ORDER — MEPERIDINE HCL 100 MG/ML IJ SOLN
INTRAMUSCULAR | Status: AC
  Filled 2012-09-05: qty 1

## 2012-09-05 MED ORDER — SODIUM CHLORIDE 0.9 % IV SOLN
INTRAVENOUS | Status: DC
  Administered 2012-09-05: 13:00:00 via INTRAVENOUS

## 2012-09-05 MED ORDER — MIDAZOLAM HCL 5 MG/5ML IJ SOLN
INTRAMUSCULAR | Status: DC | PRN
  Administered 2012-09-05: 1 mg via INTRAVENOUS
  Administered 2012-09-05: 2 mg via INTRAVENOUS

## 2012-09-05 MED ORDER — BUTAMBEN-TETRACAINE-BENZOCAINE 2-2-14 % EX AERO
INHALATION_SPRAY | CUTANEOUS | Status: DC | PRN
  Administered 2012-09-05: 2 via TOPICAL

## 2012-09-05 MED ORDER — MEPERIDINE HCL 100 MG/ML IJ SOLN
INTRAMUSCULAR | Status: DC | PRN
  Administered 2012-09-05: 25 mg via INTRAVENOUS

## 2012-09-05 MED ORDER — STERILE WATER FOR IRRIGATION IR SOLN
Status: DC | PRN
  Administered 2012-09-05: 14:00:00

## 2012-09-05 NOTE — H&P (Signed)
Primary Care Physician:  Harlow Asa, MD Primary Gastroenterologist:  Dr. Darrick Penna  Pre-Procedure History & Physical: HPI:  Adam [name] is a 62 y.o. male here for peptic ulcer disease. NO ASA/NSAIDS.  Past Medical History  Diagnosis Date  . Eosinophilic granuloma JAN 2011 GASTRIC, followed by Dr. [name]    EGD JAN 2011, JUN 2011, NOV 2011  . HTN (hypertension)   . Hyperlipemia   . Depression   . BMI 31.0-31.9,adult JUNE 2011 180 LBS   . GERD (gastroesophageal reflux disease)     Past Surgical History  Procedure Laterality Date  . Hand surgery  at the age of 3    Brother accidentally cut it  . Ganglion cyst excision      from L wrist and pins placed for Fx involving L thumb  . Back surgery    . Tumor removal  1994    Benign from brain at [facility]  . Cyst removed      from Left leg and knee  . Upper gastrointestinal endoscopy  JAN 2011    GASTRIC ULCER-EOS GRANULOMA  . Upper gastrointestinal endoscopy  JUN 2011    GASTRIC ULCER- EOS, NO H. PYLORI  . Upper gastrointestinal endoscopy  NOV 2011    GASTRIC NODULE, no ulcer- EOS  . Colonoscopy  JAN 2011 COMPLETE    hypoTN and bradycardia w/ Demerol ,Phenergan and Versed, [place] TICS, SML IH  . Colonoscopy  2003    NUR-normal  . Colonoscopy  06/21/09    Haley Fuerstenberg-sigmoid diverticulosis, sm int hemorrhoids  . Mass excision  04/29/2012    Procedure: EXCISION MASS;  Surgeon: [name], MD;  Location: [facility];  Service: General;  Laterality: Right;  Excision of Vascular Mass Right Arm  . Esophagogastroduodenoscopy  05/20/2012    SLF: MILD ESOPHAGITIS.NO BARRETT'S/Multiple ulcers ranging between 3-5 mm/MILD Duodenal inflammation was found in the duodenal bulb    Prior to Admission medications   Medication Sig Start Date End Date Taking? Authorizing Provider  doxazosin (CARDURA) 8 MG tablet Take 8 mg by mouth at bedtime.     Yes Historical Provider, MD  enalapril (VASOTEC) 20 MG tablet Take 20 mg by mouth daily.      Yes Historical Provider, MD  pantoprazole (PROTONIX) 40 MG tablet Take 1 tablet (40 mg total) by mouth 2 (two) times daily. 05/20/12  Yes [name], MD  PARoxetine (PAXIL) 20 MG tablet Take 20 mg by mouth daily.     Yes Historical Provider, MD  pravastatin (PRAVACHOL) 20 MG tablet Take 20 mg by mouth at bedtime.     Yes Historical Provider, MD  verapamil (VERELAN PM) 180 MG 24 hr capsule Take 180 mg by mouth daily.     Yes Historical Provider, MD    Allergies as of 08/31/2012 - Review Complete 08/31/2012  Allergen Reaction Noted  . Sulfonamide derivatives      Family History  Problem Relation Age of Onset  . Colon cancer Maternal Grandmother     OVER 60    History   Social History  . Marital Status: Married    Spouse Name: N/A    Number of Children: 1  . Years of Education: N/A   Occupational History  . disabled    Social History Main Topics  . Smoking status: Former Smoker -- 0.25 packs/day for 15 years    Types: Cigars    Quit date: 04/29/2002  . Smokeless tobacco: Not on file  . Alcohol Use: Yes  Comment: mixed drink 1-2 per mo  . Drug Use: No  . Sexually Active: Yes    Birth Control/ Protection: None   Other Topics Concern  . Not on file   Social History Narrative   Lives w/ wife    Review of Systems: See HPI, otherwise negative ROS   Physical Exam: BP 123/90  Pulse 57  Temp(Src) 98 F (36.7 C) (Oral)  Resp 18  SpO2 98% General:   Alert,  pleasant and cooperative in NAD Head:  Normocephalic and atraumatic. Neck:  Supple; Lungs:  Clear throughout to auscultation.    Heart:  Regular rate and rhythm. Abdomen:  Soft, nontender and nondistended. Normal bowel sounds, without guarding, and without rebound.   Neurologic:  Alert and  oriented x4;  grossly normal neurologically.  Impression/Plan:     PUD  PLAN:  REPEAT EGD TO CONFIRM ULCERS ARE HEALED.

## 2012-09-05 NOTE — Op Note (Signed)
[facility] [address] [place], [address]   ENDOSCOPY PROCEDURE REPORT  PATIENT: Adam [name], Adam [name]  MR#: [id number] BIRTHDATE: 10/13/1950 , 61  yrs. old GENDER: Male  ENDOSCOPIST: Jonette Eva, MD REFERRED FA:[id number] Gerda Diss, M.D.  PROCEDURE DATE: 09/05/2012 PROCEDURE:   EGD w/ biopsy  INDICATIONS:PMHX: EOSINOPHILIC ULCERS, PUD.  LAST EGD DEC 2013-PUD. EGD TO RE-ASSESS HEALING. MEDICATIONS: Demerol 25 mg IV and Versed 3 mg IV TOPICAL ANESTHETIC:   Cetacaine Spray  DESCRIPTION OF PROCEDURE:     Physical exam was performed.  Informed consent was obtained from the patient after explaining the benefits, risks, and alternatives to the procedure.  The patient was connected to the monitor and placed in the left lateral position.  Continuous oxygen was provided by nasal cannula and IV medicine administered through an indwelling cannula.  After administration of sedation, the patients esophagus was intubated and the EG-[id number] ([id number])  endoscope was advanced under direct visualization to the second portion of the duodenum.  The scope was removed slowly by carefully examining the color, texture, anatomy, and integrity of the mucosa on the way out.  The patient was recovered in endoscopy and discharged home in satisfactory condition.   ESOPHAGUS: The mucosa of the esophagus appeared normal.   STOMACH: Non-erosive gastritis (inflammation) was found in the gastric antrum.  Multiple biopsies were performed using cold forceps. DUODENUM: The duodenal mucosa showed no abnormalities in the bulb and second portion of the duodenum.  COMPLICATIONS:   None  ENDOSCOPIC IMPRESSION: 1.   MILD Non-erosive gastritis (inflammation) in the gastric antrum  RECOMMENDATIONS: AWAIT BIOPSY PROTONIX BID LOW FAT DIET OPV DEC 2014   REPEAT EXAM:   _______________________________ Rosalie DoctorJonette Eva, MD 09/05/2012 2:48 PM

## 2012-09-05 NOTE — Progress Notes (Signed)
CC PCP 

## 2012-09-07 ENCOUNTER — Encounter (HOSPITAL_COMMUNITY): Payer: Self-pay | Admitting: Gastroenterology

## 2012-09-10 ENCOUNTER — Other Ambulatory Visit: Payer: Self-pay | Admitting: Family Medicine

## 2012-09-13 ENCOUNTER — Telehealth: Payer: Self-pay | Admitting: Gastroenterology

## 2012-09-13 NOTE — Telephone Encounter (Signed)
Please call pt. His stomach Bx shows gastritis.    CONTINUE PROTONIX.   FOLLOW A LOW FAT DIET.  FOLLOW UP DEC 2014.

## 2012-09-13 NOTE — Telephone Encounter (Signed)
Cc PCP 

## 2012-09-14 NOTE — Telephone Encounter (Signed)
Called and informed pt.  

## 2012-09-14 NOTE — Telephone Encounter (Signed)
Reminder appt made 

## 2012-09-20 ENCOUNTER — Other Ambulatory Visit: Payer: Self-pay | Admitting: Family Medicine

## 2012-09-20 ENCOUNTER — Ambulatory Visit (INDEPENDENT_AMBULATORY_CARE_PROVIDER_SITE_OTHER): Payer: BC Managed Care – PPO | Admitting: Family Medicine

## 2012-09-20 ENCOUNTER — Encounter: Payer: Self-pay | Admitting: Family Medicine

## 2012-09-20 VITALS — BP 122/80 | HR 80 | Ht 64.5 in | Wt 180.1 lb

## 2012-09-20 DIAGNOSIS — I10 Essential (primary) hypertension: Secondary | ICD-10-CM | POA: Diagnosis not present

## 2012-09-20 DIAGNOSIS — K219 Gastro-esophageal reflux disease without esophagitis: Secondary | ICD-10-CM

## 2012-09-20 DIAGNOSIS — Z79899 Other long term (current) drug therapy: Secondary | ICD-10-CM | POA: Diagnosis not present

## 2012-09-20 DIAGNOSIS — E785 Hyperlipidemia, unspecified: Secondary | ICD-10-CM

## 2012-09-20 MED ORDER — PANTOPRAZOLE SODIUM 40 MG PO TBEC: 40.0000 mg | DELAYED_RELEASE_TABLET | Freq: Two times a day (BID) | ORAL | Status: DC

## 2012-09-20 MED ORDER — PRAVASTATIN SODIUM 20 MG PO TABS: 20.0000 mg | ORAL_TABLET | Freq: Every day | ORAL | Status: DC

## 2012-09-20 MED ORDER — PAROXETINE HCL 20 MG PO TABS: 20.0000 mg | ORAL_TABLET | Freq: Every day | ORAL | Status: DC

## 2012-09-20 MED ORDER — DOXAZOSIN MESYLATE 8 MG PO TABS: ORAL_TABLET | ORAL | Status: DC

## 2012-09-20 MED ORDER — ENALAPRIL MALEATE 20 MG PO TABS: ORAL_TABLET | ORAL | Status: DC

## 2012-09-20 MED ORDER — VERAPAMIL HCL ER 180 MG PO TBCR: EXTENDED_RELEASE_TABLET | ORAL | Status: DC

## 2012-09-20 NOTE — Progress Notes (Signed)
  Subjective:    Patient ID: Adam [name], male    DOB: 04/04/1951, 62 y.o.   MRN: [id number]  Hypertension The current episode started more than 1 year ago. The problem is unchanged. The problem is controlled. Pertinent negatives include no chest pain, headaches, malaise/fatigue or neck pain. Risk factors for coronary artery disease include dyslipidemia. Past treatments include ACE inhibitors and calcium channel blockers. The current treatment provides moderate improvement. There are no compliance problems.    Patient arrives to the office for followup of multiple concerns. He reports trying to watch his diet as far as his lipids. Walking some but not a lot. Patient reports his reflux is stable as long as he stays on his medications. Try to adjust his caffeine intake accordingly. Patient reports his depression overall is clinically stable. No suicidal or homicidal thoughts.   Review of Systems  Constitutional: Negative for malaise/fatigue.  HENT: Negative for neck pain.   Cardiovascular: Negative for chest pain.  Neurological: Negative for headaches.   Otherwise negative.    Objective:   Physical Exam  Alert no acute distress. Vitals reviewed. HEENT slight nasal congestion. Lungs clear to auscultation. Heart regular in rhythm. Abdomen benign.      Assessment & Plan:  Impression #1 reflux clinically stable. #2 hypertension good control. #3 depression clinically stable. #4 hyperlipidemia status uncertain. Plan diet and exercise discussed. Appropriate blood work. 25 minutes spent most in discussion. WSL

## 2012-09-20 NOTE — Patient Instructions (Signed)
Stay with the same meds and work hard on diet and exercise

## 2012-09-21 LAB — HEPATIC FUNCTION PANEL
ALT: 13 U/L (ref 0–53)
Alkaline Phosphatase: 46 U/L (ref 39–117)
Bilirubin, Direct: 0.1 mg/dL (ref 0.0–0.3)
Indirect Bilirubin: 0.7 mg/dL (ref 0.0–0.9)
Total Protein: 6.5 g/dL (ref 6.0–8.3)

## 2012-09-21 LAB — LIPID PANEL
Cholesterol: 191 mg/dL (ref 0–200)
LDL Cholesterol: 110 mg/dL — ABNORMAL HIGH (ref 0–99)
Triglycerides: 68 mg/dL (ref <150)
VLDL: 14 mg/dL (ref 0–40)

## 2012-09-28 ENCOUNTER — Encounter: Payer: Self-pay | Admitting: Family Medicine

## 2012-09-28 ENCOUNTER — Ambulatory Visit (INDEPENDENT_AMBULATORY_CARE_PROVIDER_SITE_OTHER): Payer: BC Managed Care – PPO | Admitting: Family Medicine

## 2012-09-28 VITALS — BP 117/80 | Temp 98.6°F | Wt 178.0 lb

## 2012-09-28 DIAGNOSIS — M7020 Olecranon bursitis, unspecified elbow: Secondary | ICD-10-CM | POA: Diagnosis not present

## 2012-09-28 DIAGNOSIS — M7021 Olecranon bursitis, right elbow: Secondary | ICD-10-CM

## 2012-09-28 MED ORDER — CEPHALEXIN 500 MG PO CAPS: 500.0000 mg | ORAL_CAPSULE | Freq: Four times a day (QID) | ORAL | Status: AC

## 2012-09-28 MED ORDER — ETODOLAC 400 MG PO TABS: 400.0000 mg | ORAL_TABLET | Freq: Two times a day (BID) | ORAL | Status: DC

## 2012-09-28 NOTE — Patient Instructions (Signed)
Expect slooooow resolution of the swelling

## 2012-09-28 NOTE — Progress Notes (Signed)
  Subjective:    Patient ID: Adam [name], male    DOB: 01-Nov-1950, 62 y.o.   MRN: [id number]  HPI  Last few days. Elbow pain. No fever. No injury. No hand rash. Rigth sided pain, worse with movement. Sometimes with reaching.  Review of Systems negative urinary symptoms. Negative vomiting diarrhea change in bowel habits ROS otherwise negative.    Objective:   Physical Exam  Alert no acute distress. Lungs clear. Heart regular in rhythm. Old transverse a distinctly tender swollen and inflamed. Distal hand no involvement. Abdominal exam benign. CVA tenderness not present. Slight lateral chest wall tenderness right side.      Assessment & Plan:  Impression 1 old plan and bursitis discussed at length. #2 costochondritis discussed. Plan Keflex. Plus anti-inflammatory. Rationale discussed. Expect slow resolution. Warning signs discussed. WSL

## 2012-11-22 ENCOUNTER — Encounter: Payer: Self-pay | Admitting: Gastroenterology

## 2012-11-26 NOTE — Progress Notes (Signed)
REVIEWED.  EGD MAR 2014 His stomach Bx shows gastritis.  CONTINUE PROTONIX.  FOLLOW A LOW FAT DIET.  FOLLOW UP DEC 2014.

## 2013-01-02 ENCOUNTER — Encounter: Payer: Self-pay | Admitting: Gastroenterology

## 2013-01-02 ENCOUNTER — Ambulatory Visit (INDEPENDENT_AMBULATORY_CARE_PROVIDER_SITE_OTHER): Payer: BC Managed Care – PPO | Admitting: Family Medicine

## 2013-01-02 VITALS — BP 100/70 | Temp 98.6°F | Wt 170.0 lb

## 2013-01-02 DIAGNOSIS — M545 Low back pain, unspecified: Secondary | ICD-10-CM | POA: Insufficient documentation

## 2013-01-02 DIAGNOSIS — M549 Dorsalgia, unspecified: Secondary | ICD-10-CM

## 2013-01-02 MED ORDER — CHLORZOXAZONE 500 MG PO TABS: 500.0000 mg | ORAL_TABLET | Freq: Four times a day (QID) | ORAL | Status: DC | PRN

## 2013-01-02 NOTE — Progress Notes (Signed)
  Subjective:    Patient ID: Adam [name], male    DOB: 01-22-1951, 62 y.o.   MRN: [id number]  Back Pain This is a new problem. The current episode started 1 to 4 weeks ago. The problem occurs constantly. The problem has been gradually worsening since onset. The pain is present in the lumbar spine. The quality of the pain is described as cramping. The pain does not radiate. The pain is at a severity of 6/10. The pain is moderate. The pain is worse during the day. Stiffness is present in the morning. Pertinent negatives include no abdominal pain or bladder incontinence.    No nocturia, sometimes wors right when straining to urinate.  Review of Systems  Gastrointestinal: Negative for abdominal pain.  Genitourinary: Negative for bladder incontinence.  Musculoskeletal: Positive for back pain.       Objective:   Physical Exam  Alert no acute distress. Lungs clear. Heart regular rate and rhythm. HEENT normal. Right lumbar region tender to palpation. Negative straight leg raise. Spine no significant tenderness.     Assessment & Plan:  Impression right lumbar strain. Worse with urinating. Worse with bowel movements. Her any type of strain or motion. Plan add chlorzoxazone 500 4 times a day. Hydrocodone when necessary. Local measures discussed. Patient to see GI specialist soon. WSL

## 2013-01-10 ENCOUNTER — Encounter: Payer: Self-pay | Admitting: Gastroenterology

## 2013-01-10 ENCOUNTER — Ambulatory Visit (INDEPENDENT_AMBULATORY_CARE_PROVIDER_SITE_OTHER): Payer: BC Managed Care – PPO | Admitting: Gastroenterology

## 2013-01-10 VITALS — BP 116/73 | HR 70 | Temp 98.1°F | Ht 64.0 in | Wt 167.8 lb

## 2013-01-10 DIAGNOSIS — R109 Unspecified abdominal pain: Secondary | ICD-10-CM

## 2013-01-10 DIAGNOSIS — M549 Dorsalgia, unspecified: Secondary | ICD-10-CM | POA: Diagnosis not present

## 2013-01-10 NOTE — Patient Instructions (Addendum)
1. Please collect stool specimen and return to our office.

## 2013-01-10 NOTE — Assessment & Plan Note (Signed)
62 year old gentleman with complaints of back pain worsened with having a bowel movement and trying to urinate. Currently being treated for lumbar strain by his PCP. Really has not noted any improvement. He denies constipation or change in stool consistency. No upper GI symptoms. Denies abdominal pain. Quite vague description of the symptoms. Last colonoscopy 3-1/2 years ago. It is not clear to me that his symptoms are related to the GI tract. We will check I. FOBT. Further recommendations to follow.

## 2013-01-10 NOTE — Progress Notes (Signed)
Primary Care Physician: Harlow Asa, MD  Primary Gastroenterologist:  Jonette Eva, MD   Chief Complaint  Patient presents with  . Advice Only    having some problems with bowels but not as bad as it was.    HPI: Adam [name] is a 62 y.o. male here for further evaluation of bowel problems. He was last seen at time of endoscopy in March of 2014 for followup of eosinophilic gastric ulcers. He had mild nonerosive gastritis in the gastric antrum, biopsies unremarkable. Last colonoscopy was in January 2011 at which time he had sigmoid diverticulosis and small internal hemorrhoids.  BM 2-3 per day. Three weeks ago noted back pain. Pain is all located in the back, no abdominal pain. But doesn't feel right after a BM. Sometimes pain in back goes away after BM. No melena, brbpr. No nocturnal pain. Not related to meals. Not worse with movement. Pain happens every day. Urinary hesitancy but no burning. No n/v, heartburn. Appetite fair. Very poor description of his symptoms. Had a single episode of breaking out in a sweat trying to have a bowel movement. Denies fever.  Current Outpatient Prescriptions  Medication Sig Dispense Refill  . chlorzoxazone (PARAFON) 500 MG tablet Take 1 tablet (500 mg total) by mouth 4 (four) times daily as needed for muscle spasms.  30 tablet  1  . doxazosin (CARDURA) 8 MG tablet TAKE 1 TABLET AT BEDTIME  30 tablet  0  . enalapril (VASOTEC) 20 MG tablet TAKE 1 TABLET BY MOUTH EVERY DAY  30 tablet  0  . loratadine (CLARITIN) 10 MG tablet Take 10 mg by mouth daily. PRN      . pantoprazole (PROTONIX) 40 MG tablet Take 1 tablet (40 mg total) by mouth 2 (two) times daily.  60 tablet  6  . PARoxetine (PAXIL) 20 MG tablet Take 1 tablet (20 mg total) by mouth daily.  30 tablet  6  . pravastatin (PRAVACHOL) 20 MG tablet Take 1 tablet (20 mg total) by mouth at bedtime.  30 tablet  6  . verapamil (CALAN-SR) 180 MG CR tablet TAKE 1 TABLET TWICE A DAY  60 tablet  0   No current  facility-administered medications for this visit.    Allergies as of 01/10/2013 - Review Complete 01/10/2013  Allergen Reaction Noted  . Sulfonamide derivatives      ROS:  General: Negative for anorexia, weight loss, fever, chills, fatigue, weakness. ENT: Negative for hoarseness, difficulty swallowing , nasal congestion. CV: Negative for chest pain, angina, palpitations, dyspnea on exertion, peripheral edema.  Respiratory: Negative for dyspnea at rest, dyspnea on exertion, cough, sputum, wheezing.  GI: See history of present illness. GU:  Negative for dysuria, hematuria, urinary incontinence, urinary frequency, nocturnal urination.  Endo: Negative for unusual weight change.    Physical Examination:   BP 116/73  Pulse 70  Temp(Src) 98.1 F (36.7 C) (Oral)  Ht 5\' 4"  (1.626 m)  Wt 167 lb 12.8 oz (76.114 kg)  BMI 28.79 kg/m2  General: Well-nourished, well-developed in no acute distress.  Eyes: No icterus. Mouth: Oropharyngeal mucosa moist and pink , no lesions erythema or exudate. Lungs: Clear to auscultation bilaterally.  Heart: Regular rate and rhythm, no murmurs rubs or gallops.  Abdomen: Bowel sounds are normal, nontender, nondistended, no hepatosplenomegaly or masses, no abdominal bruits or hernia , no rebound or guarding.   Extremities: No lower extremity edema. No clubbing or deformities. Neuro: Alert and oriented x 4   Skin: Warm and dry, no  jaundice.   Psych: Alert and cooperative, normal mood and affect.

## 2013-01-11 ENCOUNTER — Ambulatory Visit (INDEPENDENT_AMBULATORY_CARE_PROVIDER_SITE_OTHER): Payer: BC Managed Care – PPO | Admitting: Gastroenterology

## 2013-01-11 DIAGNOSIS — R109 Unspecified abdominal pain: Secondary | ICD-10-CM

## 2013-01-12 NOTE — Progress Notes (Signed)
No PCP on File 

## 2013-01-17 ENCOUNTER — Encounter: Payer: Self-pay | Admitting: General Practice

## 2013-01-17 NOTE — Progress Notes (Signed)
ifobt was negative. 

## 2013-01-19 ENCOUNTER — Encounter: Payer: Self-pay | Admitting: Gastroenterology

## 2013-01-19 NOTE — Progress Notes (Signed)
REVIEWED. AGREE. NO ACUTE INDICATION FOR TCS.

## 2013-01-19 NOTE — Telephone Encounter (Signed)
ERROR

## 2013-01-20 NOTE — Progress Notes (Signed)
Quick Note:  Called and informed pt. ______ 

## 2013-01-20 NOTE — Progress Notes (Signed)
Quick Note:  Please let patient know his ifobt was negative. Discussed with Dr. Darrick Penna. No indication for colonoscopy at this time. Would recommend ongoing treatment for back strain as per PCP and call us if any change in bowels, blood in stool, abdominal pain, etc. ______

## 2013-02-23 ENCOUNTER — Ambulatory Visit (INDEPENDENT_AMBULATORY_CARE_PROVIDER_SITE_OTHER): Payer: BC Managed Care – PPO | Admitting: Family Medicine

## 2013-02-23 ENCOUNTER — Encounter: Payer: Self-pay | Admitting: Family Medicine

## 2013-02-23 VITALS — BP 112/74 | Temp 98.2°F | Ht 64.0 in | Wt 167.0 lb

## 2013-02-23 DIAGNOSIS — Z79899 Other long term (current) drug therapy: Secondary | ICD-10-CM

## 2013-02-23 DIAGNOSIS — N419 Inflammatory disease of prostate, unspecified: Secondary | ICD-10-CM

## 2013-02-23 DIAGNOSIS — R300 Dysuria: Secondary | ICD-10-CM

## 2013-02-23 DIAGNOSIS — I10 Essential (primary) hypertension: Secondary | ICD-10-CM

## 2013-02-23 DIAGNOSIS — E782 Mixed hyperlipidemia: Secondary | ICD-10-CM

## 2013-02-23 LAB — POCT URINALYSIS DIPSTICK: Spec Grav, UA: 1.02

## 2013-02-23 MED ORDER — CIPROFLOXACIN HCL 500 MG PO TABS: 500.0000 mg | ORAL_TABLET | Freq: Two times a day (BID) | ORAL | Status: AC

## 2013-02-23 NOTE — Patient Instructions (Addendum)
Prostatitis Prostatitis is redness, soreness, and puffiness (swelling) of the prostate gland. The prostate gland is the walnut-sized gland located just below your bladder. HOME CARE:   Take all medicines as told by your doctor.  Take warm-water baths (sitz baths) as told by your doctor. GET HELP RIGHT AWAY IF:   You have chills.  You feel sick to your stomach (nauseous) or like you will throw up (vomit).  You feel lightheaded or like you will pass out (faint).  You are unable to pee (urinate).  You have blood or blood clumps (clots) in your pee (urine).  Your symptoms get worse, not better.  You have a fever. MAKE SURE YOU:  Understand these instructions.  Will watch your condition.  Will get help right away if you are not doing well or get worse. Document Released: 11/24/2011 Document Reviewed: 11/24/2011 [facility] Patient Information 2014 [place], [place].   AZO STANDARD use for 4 to 5 days , pharmacy should have it  Use Cipro 500 one 2 times a day for 30 days  Wellness exam and labs in 4 weeks  Please do your labs before you come

## 2013-02-23 NOTE — Progress Notes (Signed)
  Subjective:    Patient ID: Adam [name], male    DOB: March 14, 1951, 62 y.o.   MRN: [id number]  HPIPatient having burning with urination off and on for the last week. Low back pain last week.   Patient with discomfort and lower back lower pelvic area along with frequency urination burning discomfort denies any high fever chills sweats nausea vomiting diarrhea. PMH benign. Patient is due for a wellness exam.  Review of Systems    see above. Denies hematuria. Lungs are Objective:   Physical Exam  Lungs are clear hearts regular abdomen soft no guarding rebound tenderness flanks nontender extremities no edema skin warm dry prostate exam enlarged and tender.      Assessment & Plan:  Prostatitis-antibiotic Cipro 500 twice a day over the course of next month as prescribed Warning signs discussed Check lab work as well as a wellness exam in about a month's time

## 2013-03-15 LAB — LIPID PANEL
Cholesterol: 179 mg/dL (ref 0–200)
HDL: 72 mg/dL (ref >39)
Total CHOL/HDL Ratio: 2.5 Ratio
Triglycerides: 44 mg/dL (ref <150)
VLDL: 9 mg/dL (ref 0–40)

## 2013-03-15 LAB — HEPATIC FUNCTION PANEL
ALT: 17 U/L (ref 0–53)
AST: 22 U/L (ref 0–37)
Albumin: 4 g/dL (ref 3.5–5.2)
Total Protein: 6.2 g/dL (ref 6.0–8.3)

## 2013-03-15 LAB — BASIC METABOLIC PANEL
Calcium: 9.4 mg/dL (ref 8.4–10.5)
Glucose, Bld: 93 mg/dL (ref 70–99)
Potassium: 4.3 mEq/L (ref 3.5–5.3)
Sodium: 137 mEq/L (ref 135–145)

## 2013-03-15 LAB — PSA: PSA: 4.48 ng/mL — ABNORMAL HIGH (ref <=4.00)

## 2013-03-28 ENCOUNTER — Ambulatory Visit (INDEPENDENT_AMBULATORY_CARE_PROVIDER_SITE_OTHER): Payer: BC Managed Care – PPO | Admitting: Family Medicine

## 2013-03-28 ENCOUNTER — Encounter: Payer: Self-pay | Admitting: Family Medicine

## 2013-03-28 VITALS — BP 110/72 | Ht 64.0 in | Wt 170.6 lb

## 2013-03-28 DIAGNOSIS — Z0000 Encounter for general adult medical examination without abnormal findings: Secondary | ICD-10-CM

## 2013-03-28 DIAGNOSIS — Z23 Encounter for immunization: Secondary | ICD-10-CM

## 2013-03-28 NOTE — Progress Notes (Signed)
Subjective:    Patient ID: Adam [name], male    DOB: [birth year], 62 y.o.   MRN: [id number]  HPI Patient arrives for a physical and to discuss recent lab work  Trying to eat the right things.  Back pain better.doing some exercises.  Results for orders placed in visit on 02/23/13  LIPID PANEL      Result Value Range   Cholesterol 179  0 - 200 mg/dL   Triglycerides 44  <409 mg/dL   HDL 72  >81 mg/dL   Total CHOL/HDL Ratio 2.5     VLDL 9  0 - 40 mg/dL   LDL Cholesterol 98  0 - 99 mg/dL  HEPATIC FUNCTION PANEL      Result Value Range   Total Bilirubin 1.3 (*) 0.3 - 1.2 mg/dL   Bilirubin, Direct 0.2  0.0 - 0.3 mg/dL   Indirect Bilirubin 1.1 (*) 0.0 - 0.9 mg/dL   Alkaline Phosphatase 45  39 - 117 U/L   AST 22  0 - 37 U/L   ALT 17  0 - 53 U/L   Total Protein 6.2  6.0 - 8.3 g/dL   Albumin 4.0  3.5 - 5.2 g/dL  BASIC METABOLIC PANEL      Result Value Range   Sodium 137  135 - 145 mEq/L   Potassium 4.3  3.5 - 5.3 mEq/L   Chloride 103  96 - 112 mEq/L   CO2 28  19 - 32 mEq/L   Glucose, Bld 93  70 - 99 mg/dL   BUN 14  6 - 23 mg/dL   Creat 1.91  4.78 - 2.95 mg/dL   Calcium 9.4  8.4 - 62.1 mg/dL  PSA      Result Value Range   PSA 4.48 (*) <=4.00 ng/mL  POCT URINALYSIS DIPSTICK      Result Value Range   Color, UA       Clarity, UA       Glucose, UA       Bilirubin, UA       Ketones, UA       Spec Grav, UA 1.020     Blood, UA       pH, UA 6.0     Protein, UA       Urobilinogen, UA       Nitrite, UA       Leukocytes, UA         . Patient reports no problems or concerns.   Review of Systems  Constitutional: Negative for fever, activity change and appetite change.  HENT: Negative for congestion and rhinorrhea.   Eyes: Negative for discharge.  Respiratory: Negative for cough and wheezing.   Cardiovascular: Negative for chest pain.  Gastrointestinal: Negative for vomiting, abdominal pain and blood in stool.  Genitourinary: Negative for frequency and difficulty  urinating.  Musculoskeletal: Negative for neck pain.  Skin: Negative for rash.  Allergic/Immunologic: Negative for environmental allergies and food allergies.  Neurological: Negative for weakness and headaches.  Psychiatric/Behavioral: Negative for agitation.       Objective:   Physical Exam  Constitutional: He appears well-developed and well-nourished.  HENT:  Head: Normocephalic and atraumatic.  Right Ear: External ear normal.  Left Ear: External ear normal.  Nose: Nose normal.  Mouth/Throat: Oropharynx is clear and moist.  Eyes: EOM are normal. Pupils are equal, round, and reactive to light.  Neck: Normal range of motion. Neck supple. No thyromegaly present.  Cardiovascular: Normal rate, regular rhythm and normal  heart sounds.   No murmur heard. Pulmonary/Chest: Effort normal and breath sounds normal. No respiratory distress. He has no wheezes.  Abdominal: Soft. Bowel sounds are normal. He exhibits no distension and no mass. There is no tenderness.  Genitourinary: Penis normal.  Musculoskeletal: Normal range of motion. He exhibits no edema.  Lymphadenopathy:    He has no cervical adenopathy.  Neurological: He is alert. He exhibits normal muscle tone.  Skin: Skin is warm and dry. No erythema.  Psychiatric: He has a normal mood and affect. His behavior is normal. Judgment normal.          Assessment & Plan:  Impression #1Wellness exam #2 hypertension good control. #3 hyperlipidemia stable #4 elevated PSA discuss PSA was in the low ones just last year now is up in the mid to upper 4 hours advised patient this may not be anything serious but could be plan diet exercise discussed. Maintain usual medications. Urology consult rationale discussed. Patient up-to-date on colonoscopy. Flu shot [place] given. WSL

## 2013-03-29 ENCOUNTER — Encounter: Payer: Self-pay | Admitting: Family Medicine

## 2013-04-25 ENCOUNTER — Ambulatory Visit (INDEPENDENT_AMBULATORY_CARE_PROVIDER_SITE_OTHER): Payer: BC Managed Care – PPO | Admitting: Urology

## 2013-04-25 DIAGNOSIS — N401 Enlarged prostate with lower urinary tract symptoms: Secondary | ICD-10-CM

## 2013-04-25 DIAGNOSIS — R300 Dysuria: Secondary | ICD-10-CM

## 2013-04-25 DIAGNOSIS — R972 Elevated prostate specific antigen [PSA]: Secondary | ICD-10-CM

## 2013-05-03 ENCOUNTER — Encounter: Payer: Self-pay | Admitting: Gastroenterology

## 2013-05-03 ENCOUNTER — Other Ambulatory Visit: Payer: Self-pay | Admitting: Family Medicine

## 2013-05-29 ENCOUNTER — Other Ambulatory Visit: Payer: Self-pay | Admitting: Family Medicine

## 2013-05-30 ENCOUNTER — Encounter (INDEPENDENT_AMBULATORY_CARE_PROVIDER_SITE_OTHER): Payer: Self-pay

## 2013-05-30 ENCOUNTER — Ambulatory Visit (INDEPENDENT_AMBULATORY_CARE_PROVIDER_SITE_OTHER): Payer: BC Managed Care – PPO | Admitting: Urology

## 2013-05-30 DIAGNOSIS — R972 Elevated prostate specific antigen [PSA]: Secondary | ICD-10-CM

## 2013-05-30 DIAGNOSIS — N320 Bladder-neck obstruction: Secondary | ICD-10-CM

## 2013-06-01 ENCOUNTER — Other Ambulatory Visit: Payer: Self-pay | Admitting: Family Medicine

## 2013-06-14 ENCOUNTER — Ambulatory Visit: Payer: BC Managed Care – PPO | Admitting: Gastroenterology

## 2013-09-15 ENCOUNTER — Ambulatory Visit (INDEPENDENT_AMBULATORY_CARE_PROVIDER_SITE_OTHER): Payer: BC Managed Care – PPO | Admitting: Nurse Practitioner

## 2013-09-15 ENCOUNTER — Encounter: Payer: Self-pay | Admitting: Nurse Practitioner

## 2013-09-15 VITALS — BP 118/80 | Temp 98.4°F | Ht 64.0 in | Wt 181.0 lb

## 2013-09-15 DIAGNOSIS — J029 Acute pharyngitis, unspecified: Secondary | ICD-10-CM

## 2013-09-15 DIAGNOSIS — R1310 Dysphagia, unspecified: Secondary | ICD-10-CM | POA: Diagnosis not present

## 2013-09-15 DIAGNOSIS — J309 Allergic rhinitis, unspecified: Secondary | ICD-10-CM

## 2013-09-15 DIAGNOSIS — E0789 Other specified disorders of thyroid: Secondary | ICD-10-CM

## 2013-09-15 DIAGNOSIS — K219 Gastro-esophageal reflux disease without esophagitis: Secondary | ICD-10-CM

## 2013-09-15 DIAGNOSIS — J300 Vasomotor rhinitis: Secondary | ICD-10-CM

## 2013-09-15 MED ORDER — FLUTICASONE PROPIONATE 50 MCG/ACT NA SUSP: 2.0000 | Freq: Every day | NASAL | Status: DC

## 2013-09-16 LAB — THYROID PANEL WITH TSH
FREE THYROXINE INDEX: 2.9 (ref 1.0–3.9)
T3 UPTAKE: 36.2 % (ref 22.5–37.0)
T4, Total: 7.9 ug/dL (ref 5.0–12.5)
TSH: 1.509 u[IU]/mL (ref 0.350–4.500)

## 2013-09-18 ENCOUNTER — Encounter: Payer: Self-pay | Admitting: Gastroenterology

## 2013-09-18 ENCOUNTER — Encounter: Payer: Self-pay | Admitting: Nurse Practitioner

## 2013-09-18 LAB — THYROID ANTIBODIES
Thyroglobulin Ab: <20 U/mL (ref <40.0)
Thyroperoxidase Ab SerPl-aCnc: <10 IU/mL (ref <35.0)

## 2013-09-18 NOTE — Progress Notes (Signed)
Subjective:  Presents complaints of feeling something stuck in his throat, occurs occasionally, began about 2-3 weeks ago. No difficulty swallowing food or medications. Minimal fever. No nausea or vomiting. Acid reflux well controlled with Protonix twice a day. Has a history of gastric ulcer and GERD. Had an EGD done in March 2014. Slight sore throat. No cough runny nose or headache. No ear pain. No wheezing. No abdominal pain. Nonsmoker. No oral tobacco use. Occasional alcohol use. Missed his last appointment with GI specialist. [facility] his mother has thyroid disease.  Objective:   BP 118/80  Temp(Src) 98.4 F (36.9 C) (Oral)  Ht 5\' 4"  (1.626 m)  Wt 181 lb (82.101 kg)  BMI 31.05 kg/m2 NAD. Alert, oriented. TMs significant clear effusion, no erythema. Nasal mucosa pale and boggy. Pharynx minimally injected with cloudy PND noted. Neck supple with mild soft anterior adenopathy. Thyroid: No goiter or mass noted, mildly tender to palpation. Patient indicates this area and just above as the area where he feels things sticking at times. Lungs clear. Heart regular rhythm. Abdomen soft nondistended with active bowel sounds x4; distinct epigastric area tenderness noted. No rebound or guarding. No obvious masses.  Assessment: Problem List Items Addressed This Visit     Digestive   DYSPHAGIA UNSPECIFIED   Relevant Orders      Thyroid antibodies (Completed)      Thyroid Panel With TSH (Completed)   GERD (gastroesophageal reflux disease)/gastritis     Other Visit Diagnoses   Vasomotor rhinitis    -  Primary    Pharyngitis        Thyroid pain        Relevant Orders       Thyroid antibodies (Completed)       Thyroid Panel With TSH (Completed)      Plan: OTC meds as directed for congestion. Recheck with GI specialist. Warning signs reviewed. Patient to call back sooner if symptoms worsen. Followup with Nicki Reaper later this month as planned. Meds ordered this encounter  Medications  . silodosin (RAPAFLO) 8  MG CAPS capsule    Sig: Take 8 mg by mouth daily with breakfast.  . fluticasone (FLONASE) 50 MCG/ACT nasal spray    Sig: Place 2 sprays into both nostrils daily.    Dispense:  16 g    Refill:  5    Order Specific Question:  Supervising Provider    Answer:  Maggie Font

## 2013-09-25 ENCOUNTER — Other Ambulatory Visit: Payer: Self-pay | Admitting: Family Medicine

## 2013-09-27 ENCOUNTER — Encounter: Payer: Self-pay | Admitting: Family Medicine

## 2013-09-27 ENCOUNTER — Ambulatory Visit (INDEPENDENT_AMBULATORY_CARE_PROVIDER_SITE_OTHER): Payer: BC Managed Care – PPO | Admitting: Family Medicine

## 2013-09-27 VITALS — BP 140/92 | Ht 64.0 in | Wt 178.0 lb

## 2013-09-27 DIAGNOSIS — I10 Essential (primary) hypertension: Secondary | ICD-10-CM

## 2013-09-27 DIAGNOSIS — Z79899 Other long term (current) drug therapy: Secondary | ICD-10-CM

## 2013-09-27 DIAGNOSIS — K3189 Other diseases of stomach and duodenum: Secondary | ICD-10-CM | POA: Diagnosis not present

## 2013-09-27 DIAGNOSIS — M549 Dorsalgia, unspecified: Secondary | ICD-10-CM

## 2013-09-27 DIAGNOSIS — K219 Gastro-esophageal reflux disease without esophagitis: Secondary | ICD-10-CM

## 2013-09-27 DIAGNOSIS — R1013 Epigastric pain: Secondary | ICD-10-CM

## 2013-09-27 DIAGNOSIS — E785 Hyperlipidemia, unspecified: Secondary | ICD-10-CM | POA: Diagnosis not present

## 2013-09-27 MED ORDER — ENALAPRIL MALEATE 20 MG PO TABS: ORAL_TABLET | ORAL | Status: DC

## 2013-09-27 MED ORDER — PRAVASTATIN SODIUM 20 MG PO TABS: ORAL_TABLET | ORAL | Status: DC

## 2013-09-27 MED ORDER — SILODOSIN 8 MG PO CAPS: 8.0000 mg | ORAL_CAPSULE | Freq: Every day | ORAL | Status: DC

## 2013-09-27 MED ORDER — DOXAZOSIN MESYLATE 8 MG PO TABS: ORAL_TABLET | ORAL | Status: DC

## 2013-09-27 MED ORDER — VERAPAMIL HCL ER 180 MG PO TBCR: EXTENDED_RELEASE_TABLET | ORAL | Status: DC

## 2013-09-27 MED ORDER — PANTOPRAZOLE SODIUM 40 MG PO TBEC: DELAYED_RELEASE_TABLET | ORAL | Status: DC

## 2013-09-27 MED ORDER — PAROXETINE HCL 20 MG PO TABS: ORAL_TABLET | ORAL | Status: DC

## 2013-09-27 NOTE — Progress Notes (Signed)
   Subjective:    Patient ID: Adam [name], male    DOB: 1950-12-15, 63 y.o.   MRN: [id number]  HPI Patient is here today for a 6 month check up.  C/o sinus drainage. He is taking Claritin and Flonase for allergies.   Sticking with chol med faithfully. No obvious side effects. Trying to watch diet but not as good as he had hoped.  Patient claims compliance with blood pressure medicine. Has not missed a dose. I want salt intake. No obvious side effects.  Urinating has improved on rapid flow. He is somewhat elevated PSA he was reassured at the time I urologist. Due to followup patient states she will do that.  Non smoker.  No true dysphagia, but still sor4 throat. See prior note due to see a gastroenterologist soon.  Not exercising much currently.     Review of Systems No headache no chest pain no back pain no abdominal pain no change about have some blood in stool    Objective:   Physical Exam Alert no apparent chest H&T mom his congestion pharynx normal neck supple lungs clear. Heart rare rhythm. Abdomen benign. Ankles edema.       Assessment & Plan:  Impression 1 hypertension good control. #2 hyperlipidemia status uncertain. #EOMI reflux discussed due to see GI shortly. #4 elevated PSA discuss to be followed up again by urologist plan appropriate blood work. Diet exercise discussed. Maintain same meds. Check every 6 months. WSL

## 2013-10-02 LAB — HEPATIC FUNCTION PANEL
ALBUMIN: 4.1 g/dL (ref 3.5–5.2)
ALK PHOS: 41 U/L (ref 39–117)
ALT: 17 U/L (ref 0–53)
AST: 18 U/L (ref 0–37)
BILIRUBIN INDIRECT: 0.7 mg/dL (ref 0.2–1.2)
BILIRUBIN TOTAL: 0.9 mg/dL (ref 0.2–1.2)
Bilirubin, Direct: 0.2 mg/dL (ref 0.0–0.3)
TOTAL PROTEIN: 6.3 g/dL (ref 6.0–8.3)

## 2013-10-02 LAB — LIPID PANEL
Cholesterol: 182 mg/dL (ref 0–200)
HDL: 60 mg/dL (ref >39)
LDL Cholesterol: 106 mg/dL — ABNORMAL HIGH (ref 0–99)
TRIGLYCERIDES: 78 mg/dL (ref <150)
Total CHOL/HDL Ratio: 3 Ratio
VLDL: 16 mg/dL (ref 0–40)

## 2013-10-19 ENCOUNTER — Encounter: Payer: Self-pay | Admitting: Gastroenterology

## 2013-10-19 ENCOUNTER — Other Ambulatory Visit: Payer: Self-pay | Admitting: Gastroenterology

## 2013-10-19 ENCOUNTER — Ambulatory Visit (INDEPENDENT_AMBULATORY_CARE_PROVIDER_SITE_OTHER): Payer: BC Managed Care – PPO | Admitting: Gastroenterology

## 2013-10-19 ENCOUNTER — Encounter (INDEPENDENT_AMBULATORY_CARE_PROVIDER_SITE_OTHER): Payer: Self-pay

## 2013-10-19 VITALS — BP 137/86 | HR 60 | Temp 97.6°F | Ht 64.0 in | Wt 178.2 lb

## 2013-10-19 DIAGNOSIS — R1310 Dysphagia, unspecified: Secondary | ICD-10-CM | POA: Diagnosis not present

## 2013-10-19 DIAGNOSIS — K589 Irritable bowel syndrome without diarrhea: Secondary | ICD-10-CM | POA: Diagnosis not present

## 2013-10-19 DIAGNOSIS — R194 Change in bowel habit: Secondary | ICD-10-CM

## 2013-10-19 DIAGNOSIS — R198 Other specified symptoms and signs involving the digestive system and abdomen: Secondary | ICD-10-CM

## 2013-10-19 DIAGNOSIS — R1319 Other dysphagia: Secondary | ICD-10-CM

## 2013-10-19 MED ORDER — PEG-KCL-NACL-NASULF-NA ASC-C 100 G PO SOLR: 1.0000 | ORAL | Status: DC

## 2013-10-19 NOTE — Progress Notes (Signed)
Subjective:    Patient ID: Adam [name], male    DOB: [birth year], 63 y.o.   MRN: [id number]  Adam Battiest, MD  HPI SINUS AND THROAT TROUBLE. DIDN'T HAVE OPV IN JAN DUE TO THE FLU. SORE IN LOWER ABDOMEN.CRAMPY, LASTS MINUTES. MAY HAPPEN: 1-2X/WEEK.  BOWELS MAY HAVE #1, TO #7. CAN'T PREDICT THE CHANGE. FOOD: NO TRIGGERS. NO CHEST PAIN, SOB OR WEIGHT LOSS. CHANGE IN BOWEL HABITS-STOOLS VARY MORE OFTEN. MAY BREAK OUT IN A SWEAT AND GETS COLD. HAD 2 BLACK STOOLS IN 2015-<1 MO AGO.  MAY HAVE PROBLEMS SWALLOWING SOLIDS AND LIQUIDS. HEARTBURN FAIRLY WELL CONTROLLED. USED ALEVE ABOUT 1 MO AGO OR MORE. NO ASPIRIN, BC/GOODY POWDERS, OR IBUPROFEN/MOTRIN. PT DENIES FEVER, CHILLS, BRBPR, nausea, vomiting, problems with sedation, OR heartburn or indigestion.   Past Medical History  Diagnosis Date  . Eosinophilic granuloma JAN 2878 GASTRIC, followed by Dr. Eugenia Pancoast    EGD JAN 2011, JUN 2011, NOV 2011  . HTN (hypertension)   . Hyperlipemia   . Depression   . BMI 31.0-31.9,adult JUNE 2011 180 LBS   . GERD (gastroesophageal reflux disease)   . Prostate hypertrophy   . IBS (irritable bowel syndrome)   . Allergy   . Chronic back pain    Past Surgical History  Procedure Laterality Date  . Hand surgery  at the age of 28    Brother accidentally cut it  . Ganglion cyst excision      from L wrist and pins placed for Fx involving L thumb  . Back surgery    . Tumor removal  1994    Benign from brain at [facility]  . Cyst removed      from Left leg and knee  . Upper gastrointestinal endoscopy  JAN 2011    GASTRIC ULCER-EOS GRANULOMA  . Upper gastrointestinal endoscopy  JUN 2011    GASTRIC ULCER- EOS, NO H. PYLORI  . Upper gastrointestinal endoscopy  NOV 2011    GASTRIC NODULE, no ulcer- EOS  . Colonoscopy  JAN 2011 COMPLETE    hypoTN and bradycardia w/ Demerol ,Phenergan and Versed, [place] TICS, SML IH  . Colonoscopy  2003    NUR-normal  . Colonoscopy  06/21/09    Adam [name]-sigmoid  diverticulosis, sm int hemorrhoids  . Mass excision  04/29/2012    Procedure: EXCISION MASS;  Surgeon: Donato Heinz, MD;  Location: [facility];  Service: General;  Laterality: Right;  Excision of Vascular Mass Right Arm  . Esophagogastroduodenoscopy  05/20/2012    SLF: MILD ESOPHAGITIS.NO BARRETT'S/Multiple ulcers ranging between 3-5 mm/MILD Duodenal inflammation was found in the duodenal bulb  . Esophagogastroduodenoscopy N/A 09/05/2012    SLF: MILD Non-erosive gastritis (inflammation) in the gastric antrum. Biopsies benign, no H. pylori.   Allergies  Allergen Reactions  . Sulfonamide Derivatives     REACTION: causes hives     Review of Systems     Objective:   Physical Exam  Vitals reviewed. Constitutional: He is oriented to person, place, and time. He appears well-nourished. No distress.  HENT:  Head: Normocephalic and atraumatic.  Mouth/Throat: Oropharynx is clear and moist. No oropharyngeal exudate.  Eyes: Pupils are equal, round, and reactive to light. No scleral icterus.  Neck: Normal range of motion. Neck supple.  Cardiovascular: Normal rate, regular rhythm and normal heart sounds.   Pulmonary/Chest: Effort normal and breath sounds normal.  Abdominal: Soft. Bowel sounds are normal. He exhibits no distension. There is tenderness. There is no rebound and no guarding.  MILD PERI-UMBILICAL TTP ON LEFT   Musculoskeletal: He exhibits no edema.  Lymphadenopathy:    He has no cervical adenopathy.  Neurological: He is alert and oriented to person, place, and time.  NO FOCAL DEFICITS   Psychiatric: He has a normal mood and affect.          Assessment & Plan:

## 2013-10-19 NOTE — Assessment & Plan Note (Signed)
SOLIDS AND SOMETIMES LIQUID. SX MAY BE DUE TO ESOPHAGEAL MOTILITY D/O PLUS STRICTURE/WEB, LESS LIKELY ESO OR GE JXN CA  EGD/DIL NEXT WEEK CONTINUE PPI LOW FAT DIET OPV IN 4 MOS

## 2013-10-19 NOTE — Assessment & Plan Note (Signed)
LAST TCS > 3 YRS AGO. SX MOST LIKELY DUE TO IBS-MIXED PATTERN, LESS LIKELY MICROSCOPIC COLITIS, COLORECTAL MASS.  TCS NEXT WEEK.  MOVIPREP START WITH LOW DOSE OF DEMEROL AND VERSED. ATROPINE AT BEDSIDE DUE TO PRIOR HISTORY OF BRADYCARDIA. OPV IN 4 MOS

## 2013-10-19 NOTE — Assessment & Plan Note (Signed)
SX NOT IDEALLY CONTROLLED.  TCS/EGD WITH DUODENAL Bx NEXT WEEK.

## 2013-10-19 NOTE — Patient Instructions (Signed)
UPPER AND LOWER ENDOSCOPY ON TUES MAY 19.  YOU MAY HAVE FULL LIQUID DIET ON MAY 18.  FOLLOW UP IN 4 MOS.   Full Liquid Diet A high-calorie, high-protein supplement should be used to meet your nutritional requirements when the full liquid diet is continued for more than 2 or 3 days. If this diet is to be used for an extended period of time (more than 7 days), a multivitamin should be considered.  Breads and Starches  Allowed: None are allowed except crackers WHOLE OR pureed (made into a thick, smooth soup) in soup.   Avoid: Any others.    Vegetables  Allowed: Strained tomato or vegetable juice. Vegetables pureed in soup.   Avoid: Any others.    Fruit  Allowed: Any strained fruit juices and fruit drinks. Include 1 serving of citrus or vitamin C-enriched fruit juice daily.   Avoid: Any others.  Meat and Meat Substitutes  Allowed: Egg  Avoid: Any meat, fish, or fowl. All cheese.  Milk  Allowed: Milk beverages, including milk shakes and instant breakfast mixes. Smooth yogurt.   Avoid: Any others. Avoid dairy products if not tolerated.    Soups and Combination Foods  Allowed: Broth, strained cream soups. Strained, broth-based soups.   Avoid: Any others.    Desserts and Sweets  Allowed: flavored gelatin, plain ice cream, sherbet, smooth pudding, junket, fruit ices, frozen ice pops, pudding pops,, frozen fudge pops, chocolate syrup. Sugar, honey, jelly, syrup.   Avoid: Any others.  Fats and Oils  Allowed: Margarine, butter, cream, sour cream, oils.   Avoid: Any others.  Beverages  Allowed: All.   Avoid: None.  Condiments  Allowed: Iodized salt, pepper, spices, flavorings. Cocoa powder.   Avoid: Any others.    SAMPLE MEAL PLAN Breakfast   cup orange juice.   1 cup  milk.   1 cup beverage (coffee or tea).   Cream or sugar, if desired.    Midmorning Snack  2 SCRAMBLED OR HARD BOILED EGG   Lunch  1 cup cream soup.    cup fruit juice.    1 cup milk.    cup custard.   1 cup beverage (coffee or tea).   Cream or sugar, if desired.    Midafternoon Snack  1 cup milk shake.  Dinner  1 cup cream soup.    cup fruit juice.   1 cup milk.    cup pudding.   1 cup beverage (coffee or tea).   Cream or sugar, if desired.  Evening Snack  1 cup supplement.  To increase calories, add sugar, cream, butter, or margarine if possible. Nutritional supplements will also increase the total calories.

## 2013-10-19 NOTE — Progress Notes (Signed)
Reminder in epic °

## 2013-10-23 NOTE — Progress Notes (Signed)
cc'd to pcp 

## 2013-10-24 ENCOUNTER — Encounter (HOSPITAL_COMMUNITY): Payer: Self-pay | Admitting: *Deleted

## 2013-10-24 ENCOUNTER — Encounter (HOSPITAL_COMMUNITY)
Admission: RE | Disposition: A | Discharge status: Home / Self Care | Payer: Self-pay | Source: Ambulatory Visit | Attending: Gastroenterology

## 2013-10-24 ENCOUNTER — Ambulatory Visit (HOSPITAL_COMMUNITY)
Admission: RE | Admit: 2013-10-24 | Discharge: 2013-10-24 | Disposition: A | Discharge status: Home / Self Care | Payer: BC Managed Care – PPO | Source: Ambulatory Visit | Attending: Gastroenterology | Admitting: Gastroenterology

## 2013-10-24 DIAGNOSIS — K589 Irritable bowel syndrome without diarrhea: Secondary | ICD-10-CM | POA: Insufficient documentation

## 2013-10-24 DIAGNOSIS — K2960 Other gastritis without bleeding: Secondary | ICD-10-CM | POA: Insufficient documentation

## 2013-10-24 DIAGNOSIS — K219 Gastro-esophageal reflux disease without esophagitis: Secondary | ICD-10-CM | POA: Insufficient documentation

## 2013-10-24 DIAGNOSIS — K2940 Chronic atrophic gastritis without bleeding: Secondary | ICD-10-CM | POA: Insufficient documentation

## 2013-10-24 DIAGNOSIS — R1319 Other dysphagia: Secondary | ICD-10-CM

## 2013-10-24 DIAGNOSIS — E785 Hyperlipidemia, unspecified: Secondary | ICD-10-CM | POA: Insufficient documentation

## 2013-10-24 DIAGNOSIS — Z882 Allergy status to sulfonamides status: Secondary | ICD-10-CM | POA: Insufficient documentation

## 2013-10-24 DIAGNOSIS — N400 Enlarged prostate without lower urinary tract symptoms: Secondary | ICD-10-CM | POA: Insufficient documentation

## 2013-10-24 DIAGNOSIS — Q438 Other specified congenital malformations of intestine: Secondary | ICD-10-CM | POA: Insufficient documentation

## 2013-10-24 DIAGNOSIS — K228 Other specified diseases of esophagus: Secondary | ICD-10-CM | POA: Insufficient documentation

## 2013-10-24 DIAGNOSIS — R1310 Dysphagia, unspecified: Secondary | ICD-10-CM | POA: Insufficient documentation

## 2013-10-24 DIAGNOSIS — R198 Other specified symptoms and signs involving the digestive system and abdomen: Secondary | ICD-10-CM | POA: Diagnosis not present

## 2013-10-24 DIAGNOSIS — R1013 Epigastric pain: Secondary | ICD-10-CM

## 2013-10-24 DIAGNOSIS — R194 Change in bowel habit: Secondary | ICD-10-CM

## 2013-10-24 DIAGNOSIS — K5730 Diverticulosis of large intestine without perforation or abscess without bleeding: Secondary | ICD-10-CM | POA: Insufficient documentation

## 2013-10-24 DIAGNOSIS — K648 Other hemorrhoids: Secondary | ICD-10-CM | POA: Insufficient documentation

## 2013-10-24 DIAGNOSIS — K3189 Other diseases of stomach and duodenum: Secondary | ICD-10-CM | POA: Diagnosis not present

## 2013-10-24 DIAGNOSIS — I10 Essential (primary) hypertension: Secondary | ICD-10-CM | POA: Insufficient documentation

## 2013-10-24 DIAGNOSIS — K2289 Other specified disease of esophagus: Secondary | ICD-10-CM | POA: Insufficient documentation

## 2013-10-24 HISTORY — PX: COLONOSCOPY: SHX5424

## 2013-10-24 HISTORY — PX: MALONEY DILATION: SHX5535

## 2013-10-24 HISTORY — PX: ESOPHAGOGASTRODUODENOSCOPY: SHX5428

## 2013-10-24 HISTORY — PX: SAVORY DILATION: SHX5439

## 2013-10-24 LAB — CBC
HCT: 41.9 % (ref 39.0–52.0)
Hemoglobin: 14.6 g/dL (ref 13.0–17.0)
MCH: 29.1 pg (ref 26.0–34.0)
MCHC: 34.8 g/dL (ref 30.0–36.0)
MCV: 83.6 fL (ref 78.0–100.0)
PLATELETS: 266 10*3/uL (ref 150–400)
RBC: 5.01 MIL/uL (ref 4.22–5.81)
RDW: 13.9 % (ref 11.5–15.5)
WBC: 5.2 10*3/uL (ref 4.0–10.5)

## 2013-10-24 SURGERY — COLONOSCOPY
Anesthesia: Moderate Sedation

## 2013-10-24 MED ORDER — SODIUM CHLORIDE 0.9 % IV SOLN
INTRAVENOUS | Status: DC
  Administered 2013-10-24: 1000 mL via INTRAVENOUS

## 2013-10-24 MED ORDER — SIMETHICONE 40 MG/0.6ML PO SUSP
ORAL | Status: DC | PRN
  Administered 2013-10-24: 09:00:00

## 2013-10-24 MED ORDER — MIDAZOLAM HCL 5 MG/5ML IJ SOLN
INTRAMUSCULAR | Status: AC
  Filled 2013-10-24: qty 10

## 2013-10-24 MED ORDER — MEPERIDINE HCL 100 MG/ML IJ SOLN
INTRAMUSCULAR | Status: DC | PRN
  Administered 2013-10-24: 15 mg via INTRAVENOUS
  Administered 2013-10-24 (×2): 25 mg via INTRAVENOUS

## 2013-10-24 MED ORDER — MIDAZOLAM HCL 5 MG/5ML IJ SOLN
INTRAMUSCULAR | Status: DC | PRN
  Administered 2013-10-24: 1 mg via INTRAVENOUS
  Administered 2013-10-24: 2 mg via INTRAVENOUS
  Administered 2013-10-24 (×3): 1 mg via INTRAVENOUS

## 2013-10-24 MED ORDER — ATROPINE SULFATE 1 MG/ML IJ SOLN
INTRAMUSCULAR | Status: AC
  Filled 2013-10-24: qty 1

## 2013-10-24 MED ORDER — ATROPINE SULFATE 1 MG/ML IJ SOLN
INTRAMUSCULAR | Status: DC | PRN
  Administered 2013-10-24: .5 mg via INTRAVENOUS

## 2013-10-24 MED ORDER — LIDOCAINE VISCOUS 2 % MT SOLN
OROMUCOSAL | Status: AC
  Filled 2013-10-24: qty 15

## 2013-10-24 MED ORDER — MEPERIDINE HCL 100 MG/ML IJ SOLN
INTRAMUSCULAR | Status: AC
  Filled 2013-10-24: qty 2

## 2013-10-24 MED ORDER — MINERAL OIL PO OIL
TOPICAL_OIL | ORAL | Status: AC
  Filled 2013-10-24: qty 30

## 2013-10-24 NOTE — Op Note (Signed)
[facility] [address] [place], [address]   ENDOSCOPY PROCEDURE REPORT  PATIENT: Adam [name], Adam [name]  MR#: [id number] BIRTHDATE: [birth year] , 28  yrs. old GENDER: Male  ENDOSCOPIST: Barney Drain, MD REFFERED QM:[id number] Wolfgang Phoenix, M.D.  PROCEDURE DATE:  10/24/2013 PROCEDURE:   EGD with biopsy and EGD with dilatation over guidewire   INDICATIONS:1.  dysphagia.   2.  dyspepsia.   3.  change in bowel habits.  REPORTS BLACK STOOLS.Marland Kitchen MEDICATIONS: TCS+ Demerol 25 mg IV and Versed 2 mg IV TOPICAL ANESTHETIC: Viscous Xylocaine  DESCRIPTION OF PROCEDURE:   After the risks benefits and alternatives of the procedure were thoroughly explained, informed consent was obtained.  The EG-[id number] ([id number])  endoscope was introduced through the mouth and advanced to the second portion of the duodenum. The instrument was slowly withdrawn as the mucosa was carefully examined.  Prior to withdrawal of the scope, the guidwire was placed.  The esophagus was dilated successfully.  The patient was recovered in endoscopy and discharged home in satisfactory condition.   ESOPHAGUS: POSSIBLE DISTAL ESOPHAGEAL WEB.   A small hiatal hernia was noted.   STOMACH: Mild non-erosive gastritis (inflammation) was found in the gastric antrum.  Multiple biopsies were performed using cold forceps.   DUODENUM: The duodenal mucosa showed no abnormalities in the bulb and second portion of the duodenum.  Cold forcep biopsies were taken in the second portion.   Dilation was then performed at the gastroesphageal junction Dilator: Savary over guidewire Size(s): 14-16 MM Resistance: minimal TO MODERATE. Heme: none  COMPLICATIONS: There were no complications.  ENDOSCOPIC IMPRESSION: 1.   POSSIBLE DISTAL ESOPHAGEAL WEB. 2.   Small hiatal hernia 3.   MILD Non-erosive gastritis  RECOMMENDATIONS: CONTINUE PROTONIX.  TAKE 30 MINUTES PRIOR TO MEALS TWICE DAILY. FOLLOW A HIGH FIBER/LOW FAT DIET.  AVOID ITEMS  THAT CAUSE BLOATING.  BIOPSY WILL BE BACK IN 7 DAYS. Follow up in 4 mos. CONSIDER BPE IF DYSPHAGIA PERSISTS. CBC TODAY Next colonoscopy in 10 years.     _______________________________ Lorrin MaisBarney Drain, MD 10/24/2013 11:00 AM

## 2013-10-24 NOTE — Op Note (Addendum)
[facility] [address] [place], [address]   COLONOSCOPY PROCEDURE REPORT  PATIENT: Adam [name], Adam [name]  MR#: [id number] BIRTHDATE: May 15, 1951 , 44  yrs. old GENDER: Male ENDOSCOPIST: Barney Drain, MD REFERRED JK:[id number] Wolfgang Phoenix, M.D. PROCEDURE DATE:  10/24/2013 PROCEDURE:   Colonoscopy with biopsy INDICATIONS:CHANGE IN BOWEL HABITS-MORE FREQUENT LOOSE STOOLS. MEDICATIONS: Demerol 50 mg IV, Versed 4 mg IV, and Atropine 0.5 mg IV  DESCRIPTION OF PROCEDURE:    Physical exam was performed.  Informed consent was obtained from the patient after explaining the benefits, risks, and alternatives to procedure.  The patient was connected to monitor and placed in left lateral position. Continuous oxygen was provided by nasal cannula and IV medicine administered through an indwelling cannula.  After administration of sedation and rectal exam, the patients rectum was intubated and the EC-3890Li ([id number]) and EG-[id number] ([id number])  colonoscope was advanced under direct visualization to the ileum.  The scope was removed slowly by carefully examining the color, texture, anatomy, and integrity mucosa on the way out.  The patient was recovered in endoscopy and discharged home in satisfactory condition.    COLON FINDINGS: The mucosa appeared normal in the terminal ileum.  , There was moderate diverticulosis noted in the sigmoid colon with associated tortuosity and colonic narrowing. OTHERWISE A normal appearing cecum, ileocecal valve, and appendiceal orifice were identified.  The ascending, hepatic flexure, transverse, splenic flexure, descending colon and rectum appeared unremarkable.  No polyps or cancers were seen.  Multiple biopsies were performed TO EVALUATE FOR MICROSCOPIC COLITIS. Moderate sized internal hemorrhoids were found.  PREP QUALITY: excellent. CECAL W/D TIME: 14 minutes COMPLICATIONS: None  ENDOSCOPIC IMPRESSION: 1.   Normal mucosa in the terminal  ileum 2.   Moderate diverticulosis noted in the sigmoid colon 3.   Moderate sized internal hemorrhoids  RECOMMENDATIONS: CONTINUE PROTONIX.  TAKE 30 MINUTES PRIOR TO MEALS TWICE DAILY. FOLLOW A HIGH FIBER/LOW FAT DIET.  AVOID ITEMS THAT CAUSE BLOATING.  BIOPSY WILL BE BACK IN 7 DAYS. Follow up in 4 mos.  Next colonoscopy in 10 years.       _______________________________ Lorrin MaisBarney Drain, MD 10/24/2013 10:53 AM

## 2013-10-24 NOTE — Interval H&P Note (Signed)
History and Physical Interval Note:  10/24/2013 8:07 AM  Adam [name]  has presented today for surgery, with the diagnosis of CHANGE IN BOWEL HABITS  AND DYSPHAGIA  The various methods of treatment have been discussed with the patient and family. After consideration of risks, benefits and other options for treatment, the patient has consented to  Procedure(s) with comments: COLONOSCOPY (N/A) - 8:15 ESOPHAGOGASTRODUODENOSCOPY (EGD) (N/A) MALONEY DILATION (N/A) SAVORY DILATION (N/A) as a surgical intervention .  The patient's history has been reviewed, patient examined, no change in status, stable for surgery.  I have reviewed the patient's chart and labs.  Questions were answered to the patient's satisfaction.     [name]

## 2013-10-24 NOTE — Discharge Instructions (Signed)
YOU DID NOT HAVE ANY POLYPS. YOU HAVE DIVERTICULOSIS IN YOUR LEFT COLON AND HEMORRHOIDS. YOU HAVE A SMALL HIATAL HERNIA & GASTRITIS. I STRETCHED YOUR ESOPHAGUS DUE YOUR PROBLEMS SWALLOWING. I DID NOT SEE A DEFINITE STRICTURE. I BIOPSIED YOUR STOMACH, DUODENUM, AND COLON.   CONTINUE PROTONIX. TAKE 30 MINUTES PRIOR TO MEALS TWICE DAILY.  FOLLOW A HIGH FIBER/LOW FAT DIET. AVOID ITEMS THAT CAUSE BLOATING. SEE INFO BELOW.  YOUR BIOPSY WILL BE BACK IN 7 DAYS.  Follow up in 4 mos.  Next colonoscopy in 10 years.    ENDOSCOPY Care After Read the instructions outlined below and refer to this sheet in the next week. These discharge instructions provide you with general information on caring for yourself after you leave the hospital. While your treatment has been planned according to the most current medical practices available, unavoidable complications occasionally occur. If you have any problems or questions after discharge, call DR. Ronette Hank, [phone].  ACTIVITY  You may resume your regular activity, but move at a slower pace for the next 24 hours.   Take frequent rest periods for the next 24 hours.   Walking will help get rid of the air and reduce the bloated feeling in your belly (abdomen).   No driving for 24 hours (because of the medicine (anesthesia) used during the test).   You may shower.   Do not sign any important legal documents or operate any machinery for 24 hours (because of the anesthesia used during the test).    NUTRITION  Drink plenty of fluids.   You may resume your normal diet as instructed by your doctor.   Begin with a light meal and progress to your normal diet. Heavy or fried foods are harder to digest and may make you feel sick to your stomach (nauseated).   Avoid alcoholic beverages for 24 hours or as instructed.    MEDICATIONS  You may resume your normal medications.   WHAT YOU CAN EXPECT TODAY  Some feelings of bloating in the abdomen.    Passage of more gas than usual.   Spotting of blood in your stool or on the toilet paper  .  IF YOU HAD POLYPS REMOVED DURING THE ENDOSCOPY:  Eat a soft diet IF YOU HAVE NAUSEA, BLOATING, ABDOMINAL PAIN, OR VOMITING.    FINDING OUT THE RESULTS OF YOUR TEST Not all test results are available during your visit. DR. Oneida Alar WILL CALL YOU WITHIN 7 DAYS OF YOUR PROCEDUE WITH YOUR RESULTS. Do not assume everything is normal if you have not heard from DR. Reigna Ruperto IN ONE WEEK, CALL HER OFFICE AT [phone].  SEEK IMMEDIATE MEDICAL ATTENTION AND CALL THE OFFICE: [phone] IF:  You have more than a spotting of blood in your stool.   Your belly is swollen (abdominal distention).   You are nauseated or vomiting.   You have a temperature over 101F.   You have abdominal pain or discomfort that is severe or gets worse throughout the day.  Low-Fat Diet BREADS, CEREALS, PASTA, RICE, DRIED PEAS, AND BEANS These products are high in carbohydrates and most are low in fat. Therefore, they can be increased in the diet as substitutes for fatty foods. They too, however, contain calories and should not be eaten in excess. Cereals can be eaten for snacks as well as for breakfast.   FRUITS AND VEGETABLES It is good to eat fruits and vegetables. Besides being sources of fiber, both are rich in vitamins and some minerals. They help you get  the daily allowances of these nutrients. Fruits and vegetables can be used for snacks and desserts.  MEATS Limit lean meat, chicken, [place], and fish to no more than 6 ounces per day. Beef, Pork, and Lamb Use lean cuts of beef, pork, and lamb. Lean cuts include:  Extra-lean ground beef.  Arm roast.  Sirloin tip.  Center-cut ham.  Round steak.  Loin chops.  Rump roast.  Tenderloin.  Trim all fat off the outside of meats before cooking. It is not necessary to severely decrease the intake of red meat, but lean choices should be made. Lean meat is rich in protein  and contains a highly absorbable form of iron. Premenopausal women, in particular, should avoid reducing lean red meat because this could increase the risk for low red blood cells (iron-deficiency anemia).  Chicken and [place] These are good sources of protein. The fat of poultry can be reduced by removing the skin and underlying fat layers before cooking. Chicken and [place] can be substituted for lean red meat in the diet. Poultry should not be fried or covered with high-fat sauces. Fish and Shellfish Fish is a good source of protein. Shellfish contain cholesterol, but they usually are low in saturated fatty acids. The preparation of fish is important. Like chicken and [place], they should not be fried or covered with high-fat sauces. EGGS Egg whites contain no fat or cholesterol. They can be eaten often. Try 1 to 2 egg whites instead of whole eggs in recipes or use egg substitutes that do not contain yolk. MILK AND DAIRY PRODUCTS Use skim or 1% milk instead of 2% or whole milk. Decrease whole milk, natural, and processed cheeses. Use nonfat or low-fat (2%) cottage cheese or low-fat cheeses made from vegetable oils. Choose nonfat or low-fat (1 to 2%) yogurt. Experiment with evaporated skim milk in recipes that call for heavy cream. Substitute low-fat yogurt or low-fat cottage cheese for sour cream in dips and salad dressings. Have at least 2 servings of low-fat dairy products, such as 2 glasses of skim (or 1%) milk each day to help get your daily calcium intake. FATS AND OILS Reduce the total intake of fats, especially saturated fat. Butterfat, lard, and beef fats are high in saturated fat and cholesterol. These should be avoided as much as possible. Vegetable fats do not contain cholesterol, but certain vegetable fats, such as coconut oil, palm oil, and palm kernel oil are very high in saturated fats. These should be limited. These fats are often used in bakery goods, processed foods, popcorn, oils, and  nondairy creamers. Vegetable shortenings and some peanut butters contain hydrogenated oils, which are also saturated fats. Read the labels on these foods and check for saturated vegetable oils. Unsaturated vegetable oils and fats do not raise blood cholesterol. However, they should be limited because they are fats and are high in calories. Total fat should still be limited to 30% of your daily caloric intake. Desirable liquid vegetable oils are corn oil, cottonseed oil, olive oil, canola oil, safflower oil, soybean oil, and sunflower oil. Peanut oil is not as good, but small amounts are acceptable. Buy a heart-healthy tub margarine that has no partially hydrogenated oils in the ingredients. Mayonnaise and salad dressings often are made from unsaturated fats, but they should also be limited because of their high calorie and fat content. Seeds, nuts, peanut butter, olives, and avocados are high in fat, but the fat is mainly the unsaturated type. These foods should be limited mainly to avoid  excess calories and fat. OTHER EATING TIPS Snacks  Most sweets should be limited as snacks. They tend to be rich in calories and fats, and their caloric content outweighs their nutritional value. Some good choices in snacks are graham crackers, melba toast, soda crackers, bagels (no egg), English muffins, fruits, and vegetables. These snacks are preferable to snack crackers, [place] fries, TORTILLA CHIPS, and POTATO chips. Popcorn should be air-popped or cooked in small amounts of liquid vegetable oil. Desserts Eat fruit, low-fat yogurt, and fruit ices instead of pastries, cake, and cookies. Sherbet, angel food cake, gelatin dessert, frozen low-fat yogurt, or other frozen products that do not contain saturated fat (pure fruit juice bars, frozen ice pops) are also acceptable.  COOKING METHODS Choose those methods that use little or no fat. They include: Poaching.  Braising.  Steaming.  Grilling.  Baking.  Stir-frying.    Broiling.  Microwaving.  Foods can be cooked in a nonstick pan without added fat, or use a nonfat cooking spray in regular cookware. Limit fried foods and avoid frying in saturated fat. Add moisture to lean meats by using water, broth, cooking wines, and other nonfat or low-fat sauces along with the cooking methods mentioned above. Soups and stews should be chilled after cooking. The fat that forms on top after a few hours in the refrigerator should be skimmed off. When preparing meals, avoid using excess salt. Salt can contribute to raising blood pressure in some people.  EATING AWAY FROM HOME Order entres, potatoes, and vegetables without sauces or butter. When meat exceeds the size of a deck of cards (3 to 4 ounces), the rest can be taken home for another meal. Choose vegetable or fruit salads and ask for low-calorie salad dressings to be served on the side. Use dressings sparingly. Limit high-fat toppings, such as bacon, crumbled eggs, cheese, sunflower seeds, and olives. Ask for heart-healthy tub margarine instead of butter.  High-Fiber Diet A high-fiber diet changes your normal diet to include more whole grains, legumes, fruits, and vegetables. Changes in the diet involve replacing refined carbohydrates with unrefined foods. The calorie level of the diet is essentially unchanged. The Dietary Reference Intake (recommended amount) for adult males is 38 grams per day. For adult females, it is 25 grams per day. Pregnant and lactating women should consume 28 grams of fiber per day. Fiber is the intact part of a plant that is not broken down during digestion. Functional fiber is fiber that has been isolated from the plant to provide a beneficial effect in the body. PURPOSE  Increase stool bulk.   Ease and regulate bowel movements.   Lower cholesterol.  INDICATIONS THAT YOU NEED MORE FIBER  Constipation and hemorrhoids.   Uncomplicated diverticulosis (intestine condition) and irritable bowel  syndrome.   Weight management.   As a protective measure against hardening of the arteries (atherosclerosis), diabetes, and cancer.   GUIDELINES FOR INCREASING FIBER IN THE DIET  Start adding fiber to the diet slowly. A gradual increase of about 5 more grams (2 slices of whole-wheat bread, 2 servings of most fruits or vegetables, or 1 bowl of high-fiber cereal) per day is best. Too rapid an increase in fiber may result in constipation, flatulence, and bloating.   Drink enough water and fluids to keep your urine clear or pale yellow. Water, juice, or caffeine-free drinks are recommended. Not drinking enough fluid may cause constipation.   Eat a variety of high-fiber foods rather than one type of fiber.   Try to  increase your intake of fiber through using high-fiber foods rather than fiber pills or supplements that contain small amounts of fiber.   The goal is to change the types of food eaten. Do not supplement your present diet with high-fiber foods, but replace foods in your present diet.  INCLUDE A VARIETY OF FIBER SOURCES  Replace refined and processed grains with whole grains, canned fruits with fresh fruits, and incorporate other fiber sources. White rice, white breads, and most bakery goods contain little or no fiber.   Brown whole-grain rice, buckwheat oats, and many fruits and vegetables are all good sources of fiber. These include: broccoli, Brussels sprouts, cabbage, cauliflower, beets, sweet potatoes, white potatoes (skin on), carrots, tomatoes, eggplant, squash, berries, fresh fruits, and dried fruits.   Cereals appear to be the richest source of fiber. Cereal fiber is found in whole grains and bran. Bran is the fiber-rich outer coat of cereal grain, which is largely removed in refining. In whole-grain cereals, the bran remains. In breakfast cereals, the largest amount of fiber is found in those with "bran" in their names. The fiber content is sometimes indicated on the label.    You may need to include additional fruits and vegetables each day.   In baking, for 1 cup white flour, you may use the following substitutions:   1 cup whole-wheat flour minus 2 tablespoons.   1/2 cup white flour plus 1/2 cup whole-wheat flour.     Hiatal Hernia A hiatal hernia occurs when a part of the stomach slides above the diaphragm. The diaphragm is the thin muscle separating the belly (abdomen) from the chest. A hiatal hernia can be something you are born with or develop over time. Hiatal hernias may allow stomach acid to flow back into your esophagus, the tube which carries food from your mouth to your stomach. If this acid causes problems it is called GERD (gastro-esophageal reflux disease).   SYMPTOMS Common symptoms of GERD are heartburn (burning in your chest). This is worse when lying down or bending over. It may also cause belching and indigestion. Some of the things which make GERD worse are:  Increased weight pushes on stomach making acid rise more easily.   Smoking markedly increases acid production.   Alcohol decreases lower esophageal sphincter pressure (valve between stomach and esophagus), allowing acid from stomach into esophagus.   Late evening meals and going to bed with a full stomach increases pressure.   Anything that causes an increase in acid production.    HOME CARE INSTRUCTIONS  Try to achieve and maintain an ideal body weight.   Avoid drinking alcoholic beverages.   DO NOT smokE.   Do not wear tight clothing around your chest or stomach.   Eat smaller meals and eat more frequently. This keeps your stomach from getting too full. Eat slowly.   Do not lie down for 2 or 3 hours after eating. Do not eat or drink anything 1 to 2 hours before going to bed.   Avoid caffeine beverages (colas, coffee, cocoa, tea), fatty foods, citrus fruits and all other foods and drinks that contain acid and that seem to increase the problems.   Avoid bending  over, especially after eating OR STRAINING. Anything that increases the pressure in your belly increases the amount of acid that may be pushed up into your esophagus.   Gastritis  Gastritis is an inflammation (the body's way of reacting to injury and/or infection) of the stomach. It is often caused by viral  or bacterial (germ) infections. It can also be caused BY ASPIRIN, BC/GOODY POWDER'S, (IBUPROFEN) MOTRIN, OR ALEVE (NAPROXEN), chemicals (including alcohol), SPICY FOODS, and medications. This illness may be associated with generalized malaise (feeling tired, not well), UPPER ABDOMINAL STOMACH cramps, and fever. One common bacterial cause of gastritis is an organism known as H. Pylori. This can be treated with antibiotics.

## 2013-10-24 NOTE — H&P (View-Only) (Signed)
Subjective:    Patient ID: Adam [name], male    DOB: 1951/02/06, 63 y.o.   MRN: [id number]  [name], MD  HPI SINUS AND THROAT TROUBLE. DIDN'T HAVE OPV IN JAN DUE TO THE FLU. SORE IN LOWER ABDOMEN.CRAMPY, LASTS MINUTES. MAY HAPPEN: 1-2X/WEEK.  BOWELS MAY HAVE #1, TO #7. CAN'T PREDICT THE CHANGE. FOOD: NO TRIGGERS. NO CHEST PAIN, SOB OR WEIGHT LOSS. CHANGE IN BOWEL HABITS-STOOLS VARY MORE OFTEN. MAY BREAK OUT IN A SWEAT AND GETS COLD. HAD 2 BLACK STOOLS IN 2015-<1 MO AGO.  MAY HAVE PROBLEMS SWALLOWING SOLIDS AND LIQUIDS. HEARTBURN FAIRLY WELL CONTROLLED. USED ALEVE ABOUT 1 MO AGO OR MORE. NO ASPIRIN, BC/GOODY POWDERS, OR IBUPROFEN/MOTRIN. PT DENIES FEVER, CHILLS, BRBPR, nausea, vomiting, problems with sedation, OR heartburn or indigestion.   Past Medical History  Diagnosis Date  . Eosinophilic granuloma JAN 7124 GASTRIC, followed by Dr. Eugenia Pancoast    EGD JAN 2011, JUN 2011, NOV 2011  . HTN (hypertension)   . Hyperlipemia   . Depression   . BMI 31.0-31.9,adult JUNE 2011 180 LBS   . GERD (gastroesophageal reflux disease)   . Prostate hypertrophy   . IBS (irritable bowel syndrome)   . Allergy   . Chronic back pain    Past Surgical History  Procedure Laterality Date  . Hand surgery  at the age of 44    Brother accidentally cut it  . Ganglion cyst excision      from L wrist and pins placed for Fx involving L thumb  . Back surgery    . Tumor removal  1994    Benign [facility]  . Cyst removed      from Left leg and knee  . Upper gastrointestinal endoscopy  JAN 2011    GASTRIC ULCER-EOS GRANULOMA  . Upper gastrointestinal endoscopy  JUN 2011    GASTRIC ULCER- EOS, NO H. PYLORI  . Upper gastrointestinal endoscopy  NOV 2011    GASTRIC NODULE, no ulcer- EOS  . Colonoscopy  JAN 2011 COMPLETE    hypoTN and bradycardia w/ Demerol ,Phenergan and Versed, [place] TICS, SML IH  . Colonoscopy  2003    NUR-normal  . Colonoscopy  06/21/09    Fields-sigmoid  diverticulosis, sm int hemorrhoids  . Mass excision  04/29/2012    Procedure: EXCISION MASS;  Surgeon: Donato Heinz, MD;  Location: [facility];  Service: General;  Laterality: Right;  Excision of Vascular Mass Right Arm  . Esophagogastroduodenoscopy  05/20/2012    SLF: MILD ESOPHAGITIS.NO BARRETT'S/Multiple ulcers ranging between 3-5 mm/MILD Duodenal inflammation was found in the duodenal bulb  . Esophagogastroduodenoscopy N/A 09/05/2012    SLF: MILD Non-erosive gastritis (inflammation) in the gastric antrum. Biopsies benign, no H. pylori.   Allergies  Allergen Reactions  . Sulfonamide Derivatives     REACTION: causes hives     Review of Systems     Objective:   Physical Exam  Vitals reviewed. Constitutional: He is oriented to person, place, and time. He appears well-nourished. No distress.  HENT:  Head: Normocephalic and atraumatic.  Mouth/Throat: Oropharynx is clear and moist. No oropharyngeal exudate.  Eyes: Pupils are equal, round, and reactive to light. No scleral icterus.  Neck: Normal range of motion. Neck supple.  Cardiovascular: Normal rate, regular rhythm and normal heart sounds.   Pulmonary/Chest: Effort normal and breath sounds normal.  Abdominal: Soft. Bowel sounds are normal. He exhibits no distension. There is tenderness. There is no rebound and no guarding.  MILD PERI-UMBILICAL TTP ON LEFT   Musculoskeletal: He exhibits no edema.  Lymphadenopathy:    He has no cervical adenopathy.  Neurological: He is alert and oriented to person, place, and time.  NO FOCAL DEFICITS   Psychiatric: He has a normal mood and affect.          Assessment & Plan:

## 2013-10-25 ENCOUNTER — Encounter (HOSPITAL_COMMUNITY): Payer: Self-pay | Admitting: Gastroenterology

## 2013-11-09 ENCOUNTER — Telehealth: Payer: Self-pay | Admitting: Gastroenterology

## 2013-11-09 NOTE — Telephone Encounter (Signed)
Reminder in EPIC 

## 2013-11-09 NOTE — Telephone Encounter (Signed)
Please call pt. His stomach Bx shows gastritis. HIS colon and small bowel biopsies are normal. THE BOWEL IRREGULARITY IS MOST LIKELY DUE TO HIS IBS.   HE SHOULD TAKE A PROBIOTIC DAILY FOR TWO MONTHS ([facility], [place]).   CONTINUE PROTONIX. TAKE 30 MINUTES PRIOR TO MEALS TWICE DAILY.  FOLLOW A HIGH FIBER/LOW FAT DIET. AVOID ITEMS THAT CAUSE BLOATING.   Follow up in 4 mos E30 SEP 2015 IBS, DYSPHAGIA.  NEXT COLONOSCOPY IN 10 YEARS.

## 2013-11-14 NOTE — Telephone Encounter (Signed)
Called. Many rings and no answer.  

## 2013-11-16 NOTE — Telephone Encounter (Signed)
Letter mailed to pt to call.  

## 2013-11-21 NOTE — Telephone Encounter (Signed)
Pt received a letter and called for results and was informed.

## 2013-11-30 ENCOUNTER — Other Ambulatory Visit: Payer: Self-pay | Admitting: Family Medicine

## 2014-02-19 ENCOUNTER — Encounter: Payer: Self-pay | Admitting: Gastroenterology

## 2014-03-06 ENCOUNTER — Encounter: Payer: Self-pay | Admitting: Gastroenterology

## 2014-03-12 ENCOUNTER — Ambulatory Visit: Payer: BC Managed Care – PPO | Admitting: Gastroenterology

## 2014-03-19 ENCOUNTER — Ambulatory Visit (INDEPENDENT_AMBULATORY_CARE_PROVIDER_SITE_OTHER): Payer: BC Managed Care – PPO | Admitting: Family Medicine

## 2014-03-19 ENCOUNTER — Encounter: Payer: Self-pay | Admitting: Family Medicine

## 2014-03-19 ENCOUNTER — Encounter: Payer: Self-pay | Admitting: Gastroenterology

## 2014-03-19 VITALS — BP 132/80 | Ht 64.0 in | Wt 172.4 lb

## 2014-03-19 DIAGNOSIS — Z125 Encounter for screening for malignant neoplasm of prostate: Secondary | ICD-10-CM

## 2014-03-19 DIAGNOSIS — Z79899 Other long term (current) drug therapy: Secondary | ICD-10-CM | POA: Diagnosis not present

## 2014-03-19 DIAGNOSIS — Z23 Encounter for immunization: Secondary | ICD-10-CM

## 2014-03-19 DIAGNOSIS — E785 Hyperlipidemia, unspecified: Secondary | ICD-10-CM

## 2014-03-19 DIAGNOSIS — K219 Gastro-esophageal reflux disease without esophagitis: Secondary | ICD-10-CM

## 2014-03-19 DIAGNOSIS — I10 Essential (primary) hypertension: Secondary | ICD-10-CM

## 2014-03-19 NOTE — Progress Notes (Signed)
   Subjective:    Patient ID: Adam [name], male    DOB: 13-Feb-1951, 63 y.o.   MRN: [id number]  Hypertension This is a chronic problem. The current episode started more than 1 year ago. Treatments tried: verapamil, cardura,vasotec,  There are no compliance problems.     Compliant with meds. Sticking with lipid medication. No obvious side effects. Has cut down fats in diet.  Diet overal good,   Handles flu shot well,  Allergies under control with the med   Elevated psa last yr, now on medication to help urine flow. Has not had a PSA yet this year.  Review of Systems No headache no chest pain no back pain no abdominal pain no change in bowel habits no blood in stool ROS otherwise negative    Objective:   Physical Exam Alert blood pressure excellent on repeat HEENT slight nasal congestion neck supple lungs clear. Heart regular in rhythm ankles without edema       Assessment & Plan:  Impression 1 hypertension good control #2 hyperlipidemia status uncertain. #3 allergic rhinitis stable #4 reflux stable #5 elevated PSA last year discussed plan appropriate blood work. Maintain same medications. Diet exercise discussed. Check 6 months wellness exam then also. WSL

## 2014-03-22 ENCOUNTER — Ambulatory Visit: Payer: BC Managed Care – PPO | Admitting: Gastroenterology

## 2014-04-03 ENCOUNTER — Ambulatory Visit: Payer: BC Managed Care – PPO | Admitting: Gastroenterology

## 2014-04-05 LAB — HEPATIC FUNCTION PANEL
ALBUMIN: 4.2 g/dL (ref 3.5–5.2)
ALT: 20 U/L (ref 0–53)
AST: 19 U/L (ref 0–37)
Alkaline Phosphatase: 44 U/L (ref 39–117)
BILIRUBIN DIRECT: 0.2 mg/dL (ref 0.0–0.3)
Indirect Bilirubin: 0.9 mg/dL (ref 0.2–1.2)
Total Bilirubin: 1.1 mg/dL (ref 0.2–1.2)
Total Protein: 6.3 g/dL (ref 6.0–8.3)

## 2014-04-05 LAB — BASIC METABOLIC PANEL
BUN: 15 mg/dL (ref 6–23)
CALCIUM: 9.2 mg/dL (ref 8.4–10.5)
CO2: 27 mEq/L (ref 19–32)
CREATININE: 1.3 mg/dL (ref 0.50–1.35)
Chloride: 103 mEq/L (ref 96–112)
GLUCOSE: 88 mg/dL (ref 70–99)
Potassium: 4.1 mEq/L (ref 3.5–5.3)
Sodium: 136 mEq/L (ref 135–145)

## 2014-04-05 LAB — LIPID PANEL
CHOL/HDL RATIO: 2.6 ratio
CHOLESTEROL: 194 mg/dL (ref 0–200)
HDL: 74 mg/dL (ref >39)
LDL Cholesterol: 109 mg/dL — ABNORMAL HIGH (ref 0–99)
Triglycerides: 56 mg/dL (ref <150)
VLDL: 11 mg/dL (ref 0–40)

## 2014-04-06 LAB — PSA: PSA: 1.18 ng/mL (ref <=4.00)

## 2014-04-09 ENCOUNTER — Ambulatory Visit: Payer: BC Managed Care – PPO | Admitting: Gastroenterology

## 2014-04-09 ENCOUNTER — Encounter: Payer: Self-pay | Admitting: Family Medicine

## 2014-04-11 ENCOUNTER — Other Ambulatory Visit: Payer: Self-pay | Admitting: Family Medicine

## 2014-04-16 ENCOUNTER — Encounter: Payer: Self-pay | Admitting: *Deleted

## 2014-04-17 ENCOUNTER — Encounter: Payer: Self-pay | Admitting: Gastroenterology

## 2014-04-17 ENCOUNTER — Ambulatory Visit (INDEPENDENT_AMBULATORY_CARE_PROVIDER_SITE_OTHER): Payer: BC Managed Care – PPO | Admitting: Gastroenterology

## 2014-04-17 VITALS — BP 125/81 | HR 62 | Temp 97.4°F | Ht 64.0 in | Wt 173.0 lb

## 2014-04-17 DIAGNOSIS — K589 Irritable bowel syndrome without diarrhea: Secondary | ICD-10-CM

## 2014-04-17 DIAGNOSIS — K219 Gastro-esophageal reflux disease without esophagitis: Secondary | ICD-10-CM

## 2014-04-17 DIAGNOSIS — R1310 Dysphagia, unspecified: Secondary | ICD-10-CM | POA: Diagnosis not present

## 2014-04-17 NOTE — Assessment & Plan Note (Signed)
GERD and dyspepsia under control, typically takes pantoprazole once daily only. May take additional dose at bedtime on occasion. Office visit in 6 months with Dr. Oneida Alar.

## 2014-04-17 NOTE — Progress Notes (Signed)
Primary Care Physician: [name], MD  Primary Gastroenterologist:  Barney Drain, MD   Chief Complaint  Patient presents with  . Dysphagia  . Irritable Bowel Syndrome    HPI: Adam [name] is a 63 y.o. male here for follow-up. He had an EGD and colonoscopy back in May for change in bowel habits and dysphagia/dyspepsia. He had also reported black stools at the time. Colonoscopy revealed normal TI, moderate diverticulosis, moderate sized internal hemorrhoids. Random colon biopsies were negative. EGD showed possible distal esophageal with, mild nonerosive gastritis with benign biopsies. Small bowel biopsies were negative.  No swallowing issues since EGD with dilation. No heartburn. No abdominal pain unless eats something he should not. Will also get diarrhea with it. Usually just a couple of BMs per day. No melena, brbpr. Takes pantoprazole once daily only and occasion night time dose. Limits use of ibuprofen. No nausea vomiting. Appetite is good.   Current Outpatient Prescriptions  Medication Sig Dispense Refill  . doxazosin (CARDURA) 8 MG tablet TAKE 1 TABLET AT BEDTIME 30 tablet 5  . enalapril (VASOTEC) 20 MG tablet TAKE 1 TABLET BY MOUTH EVERY DAY 30 tablet 5  . fluticasone (FLONASE) 50 MCG/ACT nasal spray Place 2 sprays into both nostrils daily. 16 g 5  . loratadine (CLARITIN) 10 MG tablet Take 10 mg by mouth daily. PRN    . pantoprazole (PROTONIX) 40 MG tablet TAKE 1 TABLET (40 MG TOTAL) BY MOUTH 2 (TWO) TIMES DAILY. 60 tablet 5  . PARoxetine (PAXIL) 20 MG tablet TAKE 1 TABLET (20 MG TOTAL) BY MOUTH DAILY. 30 tablet 5  . pravastatin (PRAVACHOL) 20 MG tablet TAKE 1 TABLET (20 MG TOTAL) BY MOUTH AT BEDTIME. 30 tablet 5  . silodosin (RAPAFLO) 8 MG CAPS capsule Take 1 capsule (8 mg total) by mouth daily with breakfast. 30 capsule 5  . verapamil (CALAN-SR) 180 MG CR tablet TAKE 1 TABLET TWICE A DAY 60 tablet 5   No current facility-administered medications for this visit.      Allergies as of 04/17/2014 - Review Complete 04/17/2014  Allergen Reaction Noted  . Sulfonamide derivatives      ROS:  General: Negative for anorexia, weight loss, fever, chills, fatigue, weakness. ENT: Negative for hoarseness, difficulty swallowing , nasal congestion. CV: Negative for chest pain, angina, palpitations, dyspnea on exertion, peripheral edema.  Respiratory: Negative for dyspnea at rest, dyspnea on exertion, cough, sputum, wheezing.  GI: See history of present illness. GU:  Negative for dysuria, hematuria, urinary incontinence, urinary frequency, nocturnal urination.  Endo: Negative for unusual weight change.    Physical Examination:   BP 125/81 mmHg  Pulse 62  Temp(Src) 97.4 F (36.3 C) (Oral)  Ht 5\' 4"  (1.626 m)  Wt 173 lb (78.472 kg)  BMI 29.68 kg/m2  General: Well-nourished, well-developed in no acute distress.  Eyes: No icterus. Mouth: Oropharyngeal mucosa moist and pink , no lesions erythema or exudate. Lungs: Clear to auscultation bilaterally.  Heart: Regular rate and rhythm, no murmurs rubs or gallops.  Abdomen: Bowel sounds are normal, nontender, nondistended, no hepatosplenomegaly or masses, no abdominal bruits or hernia , no rebound or guarding.   Extremities: No lower extremity edema. No clubbing or deformities. Neuro: Alert and oriented x 4   Skin: Warm and dry, no jaundice.   Psych: Alert and cooperative, normal mood and affect.  Labs:  Lab Results  Component Value Date   CREATININE 1.30 04/05/2014   BUN 15 04/05/2014   NA 136  04/05/2014   K 4.1 04/05/2014   CL 103 04/05/2014   CO2 27 04/05/2014   Lab Results  Component Value Date   ALT 20 04/05/2014   AST 19 04/05/2014   ALKPHOS 44 04/05/2014   BILITOT 1.1 04/05/2014   Lab Results  Component Value Date   WBC 5.2 10/24/2013   HGB 14.6 10/24/2013   HCT 41.9 10/24/2013   MCV 83.6 10/24/2013   PLT 266 10/24/2013     Imaging Studies: No results found.

## 2014-04-17 NOTE — Progress Notes (Signed)
cc'ed to pcp °

## 2014-04-17 NOTE — Patient Instructions (Signed)
1. Continue pantoprazole 40 mg every morning. You may take an additional dose in the evenings if needed. 2. Office visit in 6 months or sooner if needed.

## 2014-04-17 NOTE — Assessment & Plan Note (Signed)
Doing well at this time. Avoid food triggers. If recurrent symptoms, repeat probiotics for 2 months.

## 2014-04-17 NOTE — Assessment & Plan Note (Signed)
Resolved status post dilation.

## 2014-04-23 ENCOUNTER — Other Ambulatory Visit: Payer: Self-pay | Admitting: Family Medicine

## 2014-06-17 ENCOUNTER — Encounter (HOSPITAL_COMMUNITY): Payer: Self-pay | Admitting: *Deleted

## 2014-06-17 ENCOUNTER — Emergency Department (HOSPITAL_COMMUNITY)
Admission: EM | Admit: 2014-06-17 | Discharge: 2014-06-17 | Disposition: A | Discharge status: Home / Self Care | Payer: Medicare Other | Attending: Emergency Medicine | Admitting: Emergency Medicine

## 2014-06-17 DIAGNOSIS — Y929 Unspecified place or not applicable: Secondary | ICD-10-CM | POA: Insufficient documentation

## 2014-06-17 DIAGNOSIS — Z87448 Personal history of other diseases of urinary system: Secondary | ICD-10-CM | POA: Diagnosis not present

## 2014-06-17 DIAGNOSIS — X58XXXA Exposure to other specified factors, initial encounter: Secondary | ICD-10-CM | POA: Insufficient documentation

## 2014-06-17 DIAGNOSIS — G8929 Other chronic pain: Secondary | ICD-10-CM | POA: Diagnosis not present

## 2014-06-17 DIAGNOSIS — S0591XA Unspecified injury of right eye and orbit, initial encounter: Secondary | ICD-10-CM | POA: Diagnosis present

## 2014-06-17 DIAGNOSIS — Z87891 Personal history of nicotine dependence: Secondary | ICD-10-CM | POA: Diagnosis not present

## 2014-06-17 DIAGNOSIS — Y9389 Activity, other specified: Secondary | ICD-10-CM | POA: Diagnosis not present

## 2014-06-17 DIAGNOSIS — Y998 Other external cause status: Secondary | ICD-10-CM | POA: Diagnosis not present

## 2014-06-17 DIAGNOSIS — F329 Major depressive disorder, single episode, unspecified: Secondary | ICD-10-CM | POA: Diagnosis not present

## 2014-06-17 DIAGNOSIS — E785 Hyperlipidemia, unspecified: Secondary | ICD-10-CM | POA: Insufficient documentation

## 2014-06-17 DIAGNOSIS — Z7952 Long term (current) use of systemic steroids: Secondary | ICD-10-CM | POA: Diagnosis not present

## 2014-06-17 DIAGNOSIS — K219 Gastro-esophageal reflux disease without esophagitis: Secondary | ICD-10-CM | POA: Insufficient documentation

## 2014-06-17 DIAGNOSIS — Z79899 Other long term (current) drug therapy: Secondary | ICD-10-CM | POA: Diagnosis not present

## 2014-06-17 DIAGNOSIS — S0501XA Injury of conjunctiva and corneal abrasion without foreign body, right eye, initial encounter: Secondary | ICD-10-CM

## 2014-06-17 DIAGNOSIS — I10 Essential (primary) hypertension: Secondary | ICD-10-CM | POA: Insufficient documentation

## 2014-06-17 MED ORDER — HYDROCODONE-ACETAMINOPHEN 5-325 MG PO TABS: 1.0000 | ORAL_TABLET | Freq: Four times a day (QID) | ORAL | Status: DC | PRN

## 2014-06-17 MED ORDER — GENTAMICIN SULFATE 0.3 % OP SOLN: 2.0000 [drp] | Freq: Four times a day (QID) | OPHTHALMIC | Status: DC

## 2014-06-17 MED ORDER — FLUORESCEIN SODIUM 1 MG OP STRP
ORAL_STRIP | OPHTHALMIC | Status: AC
  Filled 2014-06-17: qty 1

## 2014-06-17 MED ORDER — TETRACAINE HCL 0.5 % OP SOLN
OPHTHALMIC | Status: AC
  Filled 2014-06-17: qty 2

## 2014-06-17 NOTE — ED Notes (Signed)
Pt c/o pain to right eye; pt states he was hunting yesterday and felt like something went in his eye; pt has redness and swelling to right eye

## 2014-06-17 NOTE — ED Notes (Signed)
Pt alert & oriented x4, stable gait. Patient given discharge instructions, paperwork & prescription(s). Patient  instructed to stop at the registration desk to finish any additional paperwork. Patient verbalized understanding. Pt left department w/ no further questions. 

## 2014-06-17 NOTE — ED Provider Notes (Signed)
CSN: [id number]     Arrival date & time 06/17/14  0541 History   First MD Initiated Contact with Patient 06/17/14 [phone]     Chief Complaint  Patient presents with  . Foreign Body in [place]     (Consider location/radiation/quality/duration/timing/severity/associated sxs/prior Treatment) HPI Comments: Patient is 64 year old male who presents with complaints of right eye irritation. He was hunting yesterday and something flew in his eye. Since that time, he has had redness, irritation, and watering.  Patient is a 64 y.o. male presenting with foreign body in eye. The history is provided by the patient.  Foreign Body in Eye This is a new problem. The current episode started yesterday. The problem occurs constantly. The problem has been gradually worsening. Nothing aggravates the symptoms. Nothing relieves the symptoms. He has tried nothing for the symptoms. The treatment provided no relief.    Past Medical History  Diagnosis Date  . Eosinophilic granuloma JAN 3976 GASTRIC, followed by Dr. Eugenia Pancoast    EGD JAN 2011, JUN 2011, NOV 2011  . HTN (hypertension)   . Hyperlipemia   . Depression   . BMI 31.0-31.9,adult JUNE 2011 180 LBS   . GERD (gastroesophageal reflux disease)   . Prostate hypertrophy   . IBS (irritable bowel syndrome)   . Allergy   . Chronic back pain   . Gastric ulcer 08/31/2012   Past Surgical History  Procedure Laterality Date  . Hand surgery  at the age of 47    Brother accidentally cut it  . Ganglion cyst excision      from L wrist and pins placed for Fx involving L thumb  . Back surgery    . Tumor removal  1994    Benign [facility] brain at [facility]  . Cyst removed      from Left leg and knee  . Upper gastrointestinal endoscopy  JAN 2011    GASTRIC ULCER-EOS GRANULOMA  . Upper gastrointestinal endoscopy  JUN 2011    GASTRIC ULCER- EOS, NO H. PYLORI  . Upper gastrointestinal endoscopy  NOV 2011    GASTRIC NODULE, no ulcer- EOS  . Colonoscopy  JAN 2011 COMPLETE    hypoTN and bradycardia w/ Demerol ,Phenergan and Versed, [place] TICS, SML IH  . Colonoscopy  2003    NUR-normal  . Colonoscopy  06/21/09    Fields-sigmoid diverticulosis, sm int hemorrhoids  . Mass excision  04/29/2012    Procedure: EXCISION MASS;  Surgeon: Donato Heinz, MD;  Location: [facility];  Service: General;  Laterality: Right;  Excision of Vascular Mass Right Arm  . Esophagogastroduodenoscopy  05/20/2012    SLF: MILD ESOPHAGITIS.NO BARRETT'S/Multiple ulcers ranging between 3-5 mm/MILD Duodenal inflammation was found in the duodenal bulb  . Esophagogastroduodenoscopy N/A 09/05/2012    SLF: MILD Non-erosive gastritis (inflammation) in the gastric antrum. Biopsies benign, no H. pylori.  . Colonoscopy N/A 10/24/2013    Normal mucosa in the terminal ileum Moderate diverticulosis noted in the sigmoid colonModerate sized internal hemorrhoids. negative random colon bx.. next TCS 10/2023  . Esophagogastroduodenoscopy N/A 10/24/2013    BHA:[id number] DISTAL ESOPHAGEAL WEBSmall hiatal herniaMILD Non-erosive gastritis. SB bx benign. gastric bx, minimal inflammation.   Venia Minks dilation N/A 10/24/2013    Procedure: Venia Minks DILATION;  Surgeon: Danie Binder, MD;  Location: [facility];  Service: Endoscopy;  Laterality: N/A;  . Savory dilation N/A 10/24/2013    Procedure: SAVORY DILATION;  Surgeon: Danie Binder, MD;  Location: [facility];  Service: Endoscopy;  Laterality: N/A;   Family History  Problem Relation Age of Onset  . Colon cancer Maternal Grandmother     OVER 60  . Hypertension Mother   . Diabetes Mother   . Hyperlipidemia Mother   . Thyroid disease Mother   . Hypertension Father   . Diabetes Father   . Stroke Father    History  Substance Use Topics  . Smoking status: Former Smoker -- 0.25 packs/day for 15 years    Types: Cigars    Quit date: 04/29/2002  . Smokeless tobacco: Not on file  . Alcohol Use: Yes     Comment: mixed drink 1-2 per mo    Review of Systems  All other  systems reviewed and are negative.     Allergies  Sulfonamide derivatives  Home Medications   Prior to Admission medications   Medication Sig Start Date End Date Taking? Authorizing Provider  doxazosin (CARDURA) 8 MG tablet TAKE 1 TABLET BY MOUTH AT BEDTIME 04/23/14   Kathyrn Drown, MD  enalapril (VASOTEC) 20 MG tablet TAKE 1 TABLET BY MOUTH EVERY DAY 04/23/14   Kathyrn Drown, MD  fluticasone (FLONASE) 50 MCG/ACT nasal spray Place 2 sprays into both nostrils daily. 09/15/13   Nilda Simmer, NP  loratadine (CLARITIN) 10 MG tablet Take 10 mg by mouth daily. PRN    Historical Provider, MD  pantoprazole (PROTONIX) 40 MG tablet TAKE 1 TABLET BY MOUTH TWICE A DAY 04/23/14   Kathyrn Drown, MD  PARoxetine (PAXIL) 20 MG tablet TAKE 1 TABLET BY MOUTH EVERY DAY 04/23/14   Kathyrn Drown, MD  pravastatin (PRAVACHOL) 20 MG tablet TAKE 1 TABLET BY MOUTH AT BEDTIME 04/23/14   Kathyrn Drown, MD  silodosin (RAPAFLO) 8 MG CAPS capsule Take 1 capsule (8 mg total) by mouth daily with breakfast. 09/27/13   [name], MD  verapamil (CALAN-SR) 180 MG CR tablet TAKE 1 TABLET BY MOUTH TWICE A DAY 04/23/14   Kathyrn Drown, MD   BP 170/98 mmHg  Pulse 60  Temp(Src) 97.9 F (36.6 C) (Oral)  Resp 16  Ht 5\' 4"  (1.626 m)  Wt 180 lb (81.647 kg)  BMI 30.88 kg/m2  SpO2 99% Physical Exam  Constitutional: He appears well-developed and well-nourished. No distress.  HENT:  Head: Normocephalic and atraumatic.  Eyes: Pupils are equal, round, and reactive to light.  The right eye is noted to have a small abrasion with fluoroscopy seen noted at approximately 3:00. The conjunctiva is injected with a watery drainage.   Neck: Normal range of motion. Neck supple.  Skin: He is not diaphoretic.  Nursing note and vitals reviewed.   ED Course  Procedures (including critical care time) Labs Review Labs Reviewed - No data to display  Imaging Review No results found.   EKG Interpretation None      MDM    Final diagnoses:  None    This appears to be a corneal abrasion. I will treat this with gentamicin drops, pain meds, when necessary return.    Veryl Speak, MD 06/17/14 [phone]

## 2014-06-17 NOTE — Discharge Instructions (Signed)
Gentamicin eyedrops as prescribed. Hydrocodone as prescribed as needed for pain.  Return to the emergency department if not improving in the next 24-48 hours, sooner if your symptoms worsen or change.   Corneal Abrasion The cornea is the clear covering at the front and center of the eye. When looking at the colored portion of the eye (iris), you are looking through the cornea. This very thin tissue is made up of many layers. The surface layer is a single layer of cells (corneal epithelium) and is one of the most sensitive tissues in the body. If a scratch or injury causes the corneal epithelium to come off, it is called a corneal abrasion. If the injury extends to the tissues below the epithelium, the condition is called a corneal ulcer. CAUSES   Scratches.  Trauma.  Foreign body in the eye. Some people have recurrences of abrasions in the area of the original injury even after it has healed (recurrent erosion syndrome). Recurrent erosion syndrome generally improves and goes away with time. SYMPTOMS   Eye pain.  Difficulty or inability to keep the injured eye open.  The eye becomes very sensitive to light.  Recurrent erosions tend to happen suddenly, first thing in the morning, usually after waking up and opening the eye. DIAGNOSIS  Your health care provider can diagnose a corneal abrasion during an eye exam. Dye is usually placed in the eye using a drop or a small paper strip moistened by your tears. When the eye is examined with a special light, the abrasion shows up clearly because of the dye. TREATMENT   Small abrasions may be treated with antibiotic drops or ointment alone.  A pressure patch may be put over the eye. If this is done, follow your doctor's instructions for when to remove the patch. Do not drive or use machines while the eye patch is on. Judging distances is hard to do with a patch on. If the abrasion becomes infected and spreads to the deeper tissues of the cornea, a  corneal ulcer can result. This is serious because it can cause corneal scarring. Corneal scars interfere with light passing through the cornea and cause a loss of vision in the involved eye. HOME CARE INSTRUCTIONS  Use medicine or ointment as directed. Only take over-the-counter or prescription medicines for pain, discomfort, or fever as directed by your health care provider.  Do not drive or operate machinery if your eye is patched. Your ability to judge distances is impaired.  If your health care provider has given you a follow-up appointment, it is very important to keep that appointment. Not keeping the appointment could result in a severe eye infection or permanent loss of vision. If there is any problem keeping the appointment, let your health care provider know. SEEK MEDICAL CARE IF:   You have pain, light sensitivity, and a scratchy feeling in one eye or both eyes.  Your pressure patch keeps loosening up, and you can blink your eye under the patch after treatment.  Any kind of discharge develops from the eye after treatment or if the lids stick together in the morning.  You have the same symptoms in the morning as you did with the original abrasion days, weeks, or months after the abrasion healed. MAKE SURE YOU:   Understand these instructions.  Will watch your condition.  Will get help right away if you are not doing well or get worse. Document Released: 05/22/2000 Document Revised: 05/30/2013 Document Reviewed: 01/30/2013 Exit[employer] Patient Information 2015  [employer]. This information is not intended to replace advice given to you by your health care provider. Make sure you discuss any questions you have with your health care provider.

## 2014-06-20 DIAGNOSIS — S0501XA Injury of conjunctiva and corneal abrasion without foreign body, right eye, initial encounter: Secondary | ICD-10-CM | POA: Diagnosis not present

## 2014-07-10 ENCOUNTER — Ambulatory Visit (INDEPENDENT_AMBULATORY_CARE_PROVIDER_SITE_OTHER): Payer: Medicare Other | Admitting: Urology

## 2014-07-10 DIAGNOSIS — N320 Bladder-neck obstruction: Secondary | ICD-10-CM | POA: Diagnosis not present

## 2014-08-13 DIAGNOSIS — H524 Presbyopia: Secondary | ICD-10-CM | POA: Diagnosis not present

## 2014-08-13 DIAGNOSIS — H11159 Pinguecula, unspecified eye: Secondary | ICD-10-CM | POA: Diagnosis not present

## 2014-08-13 DIAGNOSIS — H5202 Hypermetropia, left eye: Secondary | ICD-10-CM | POA: Diagnosis not present

## 2014-08-13 DIAGNOSIS — H52223 Regular astigmatism, bilateral: Secondary | ICD-10-CM | POA: Diagnosis not present

## 2014-09-18 ENCOUNTER — Encounter: Payer: Self-pay | Admitting: Family Medicine

## 2014-09-18 ENCOUNTER — Ambulatory Visit (INDEPENDENT_AMBULATORY_CARE_PROVIDER_SITE_OTHER): Payer: Medicare Other | Admitting: Family Medicine

## 2014-09-18 ENCOUNTER — Ambulatory Visit (HOSPITAL_COMMUNITY)
Admission: RE | Admit: 2014-09-18 | Discharge: 2014-09-18 | Disposition: A | Discharge status: Home / Self Care | Payer: Medicare Other | Source: Ambulatory Visit | Attending: Family Medicine | Admitting: Family Medicine

## 2014-09-18 VITALS — BP 148/90 | Ht 64.0 in | Wt 171.0 lb

## 2014-09-18 DIAGNOSIS — M25521 Pain in right elbow: Secondary | ICD-10-CM

## 2014-09-18 DIAGNOSIS — Z79899 Other long term (current) drug therapy: Secondary | ICD-10-CM

## 2014-09-18 DIAGNOSIS — I10 Essential (primary) hypertension: Secondary | ICD-10-CM | POA: Diagnosis not present

## 2014-09-18 DIAGNOSIS — E785 Hyperlipidemia, unspecified: Secondary | ICD-10-CM

## 2014-09-18 DIAGNOSIS — M79621 Pain in right upper arm: Secondary | ICD-10-CM | POA: Diagnosis not present

## 2014-09-18 DIAGNOSIS — M6281 Muscle weakness (generalized): Secondary | ICD-10-CM | POA: Insufficient documentation

## 2014-09-18 MED ORDER — PREDNISONE 20 MG PO TABS
ORAL_TABLET | ORAL | Status: DC
Start: 2014-09-18 — End: 2014-09-27

## 2014-09-18 NOTE — Progress Notes (Signed)
Patient notified and verbalized understanding of the test results. No further questions. 

## 2014-09-18 NOTE — Progress Notes (Signed)
   Subjective:    Patient ID: Adam [name], male    DOB: 1950-11-23, 64 y.o.   MRN: [id number]  HPI Patient is here today for a check up.  Last seen 03/19/14  Pt c/o of right upper arm pain. It started about a month ago. Gradually worsening. Hurts most in the morning.    Compliant with blood pressure medicine. No obvious side effect. Has cut salt intake down.  Compliant with antidepressant medicine. States she definitely feels it helps him.  Compliant with lipid medication. Has cut his fat intake also down.  Patient reports that the right upper arm pain is in the area of the mid humerus. Worse with motions. Feels a bit weak at times. No shoulder pain no neck pain no numbness or tingling pain is sharp in nature. Progressive over the last few weeks. No nocturnal pain Review of Systems No headache no chest pain no back pain no abdominal pain no change in bowel habits    Objective:   Physical Exam  Alert vital stable no acute distress H&T normal lungs clear heart regular rate rhythm right mid upper arm tender deep palpation shoulder within good range question triceps weakness ankles without edema    Assessment & Plan:  Impression 1 hypertension good control #2 depression stable per patient #3 hyperlipidemia status uncertain #4 arm pain etiology unclear plan prednisone taper along with anti-inflammatory medicine. Follow-up in a few weeks. Appropriate blood work. Maintain other meds. WSL

## 2014-09-19 LAB — HEPATIC FUNCTION PANEL
ALT: 18 IU/L (ref 0–44)
AST: 20 IU/L (ref 0–40)
Albumin: 4.3 g/dL (ref 3.6–4.8)
Alkaline Phosphatase: 49 IU/L (ref 39–117)
Bilirubin Total: 0.9 mg/dL (ref 0.0–1.2)
Bilirubin, Direct: 0.18 mg/dL (ref 0.00–0.40)
Total Protein: 6.5 g/dL (ref 6.0–8.5)

## 2014-09-19 LAB — LIPID PANEL
CHOLESTEROL TOTAL: 203 mg/dL — AB (ref 100–199)
Chol/HDL Ratio: 2.1 ratio units (ref 0.0–5.0)
HDL: 95 mg/dL (ref >39)
LDL CALC: 99 mg/dL (ref 0–99)
TRIGLYCERIDES: 43 mg/dL (ref 0–149)
VLDL Cholesterol Cal: 9 mg/dL (ref 5–40)

## 2014-09-20 ENCOUNTER — Encounter: Payer: Self-pay | Admitting: Family Medicine

## 2014-09-27 ENCOUNTER — Encounter: Payer: Self-pay | Admitting: Family Medicine

## 2014-09-27 ENCOUNTER — Other Ambulatory Visit: Payer: Self-pay | Admitting: Family Medicine

## 2014-09-27 ENCOUNTER — Ambulatory Visit (INDEPENDENT_AMBULATORY_CARE_PROVIDER_SITE_OTHER): Payer: Medicare Other | Admitting: Family Medicine

## 2014-09-27 VITALS — BP 134/86 | Ht 64.0 in | Wt 173.0 lb

## 2014-09-27 DIAGNOSIS — R29898 Other symptoms and signs involving the musculoskeletal system: Secondary | ICD-10-CM

## 2014-09-27 DIAGNOSIS — M79601 Pain in right arm: Secondary | ICD-10-CM

## 2014-09-27 NOTE — Progress Notes (Signed)
   Subjective:    Patient ID: Adam [name], male    DOB: Oct 24, 1950, 64 y.o.   MRN: [id number]  HPI Patient is here today for a follow up visit on right arm pain.  Arm still aching at times  Feels weak in the uppr arm  No obv neck pain   Patient states that it has improved but pain is still noted. Patient has completed the prednisone. Patient has no other concerns at this time.   Patient continues to report right upper arm weakness. Particularly with pushing motions. Also deep ache and right next lumbar disc di  sease mid humerus region.   Minimal improvement with prednisone.  History of Review of Systems  no headache no neck pain no chest pain no abdominal pain  Physical exam vitals stable. Alert no acute distress neck supple right arm subjective deep pain during exam. Shoulder no obvious pain. Triceps weakness noted. Diminishment of reflux. Sensation fair  Impression neuropathic weakness. History of known disc disease. Time to press on with imaging. Discussed at length WSL       Physical Exam        Assessment & Plan:

## 2014-10-01 ENCOUNTER — Ambulatory Visit (HOSPITAL_COMMUNITY)
Admission: RE | Admit: 2014-10-01 | Discharge: 2014-10-01 | Disposition: A | Discharge status: Home / Self Care | Payer: Medicare Other | Source: Ambulatory Visit | Attending: Family Medicine | Admitting: Family Medicine

## 2014-10-01 DIAGNOSIS — M79601 Pain in right arm: Secondary | ICD-10-CM | POA: Diagnosis not present

## 2014-10-01 DIAGNOSIS — M5032 Other cervical disc degeneration, mid-cervical region: Secondary | ICD-10-CM | POA: Diagnosis not present

## 2014-10-01 DIAGNOSIS — M5031 Other cervical disc degeneration,  high cervical region: Secondary | ICD-10-CM | POA: Diagnosis not present

## 2014-10-03 ENCOUNTER — Encounter: Payer: Self-pay | Admitting: Gastroenterology

## 2014-10-03 NOTE — Addendum Note (Signed)
Addended by: Carmelina Noun on: 10/03/2014 06:04 PM   Modules accepted: Orders

## 2014-10-04 ENCOUNTER — Telehealth: Payer: Self-pay | Admitting: Family Medicine

## 2014-10-04 NOTE — Telephone Encounter (Signed)
Pt's OV note for 09/27/14 is not complete, will be sending referral to neurology (both [place] and [facility] are part of [facility] and have Epic, they will get note from system)

## 2014-10-12 NOTE — Telephone Encounter (Signed)
i completed and closed that note a long time ago, i can see how you think otherwise, you will notice the physical and assessment and plan went into the column above, the computer sometimes does  This, the note is complete

## 2014-10-12 NOTE — Telephone Encounter (Signed)
Pt called to check on referral status.  Explained that we are working on it.  Please complete office note from 09/27/14.  Need to send OV note and MRI results to Dr. Merlene Laughter for referral per MRI result note

## 2014-10-13 ENCOUNTER — Other Ambulatory Visit: Payer: Self-pay | Admitting: Family Medicine

## 2014-10-31 ENCOUNTER — Encounter: Payer: Self-pay | Admitting: Neurology

## 2014-10-31 ENCOUNTER — Ambulatory Visit (INDEPENDENT_AMBULATORY_CARE_PROVIDER_SITE_OTHER): Payer: Medicare Other | Admitting: Neurology

## 2014-10-31 VITALS — BP 129/79 | HR 53 | Temp 97.4°F | Wt 172.4 lb

## 2014-10-31 DIAGNOSIS — T148XXA Other injury of unspecified body region, initial encounter: Secondary | ICD-10-CM

## 2014-10-31 DIAGNOSIS — M5010 Cervical disc disorder with radiculopathy, unspecified cervical region: Secondary | ICD-10-CM

## 2014-10-31 DIAGNOSIS — X503XXA Overexertion from repetitive movements, initial encounter: Secondary | ICD-10-CM

## 2014-10-31 DIAGNOSIS — M79601 Pain in right arm: Secondary | ICD-10-CM | POA: Insufficient documentation

## 2014-10-31 NOTE — Progress Notes (Signed)
[facility]    Provider:  Dr Jaynee Eagles Referring Provider: [name], MD Primary Care Physician:  Mickie Hillier, MD  CC:  Right arm pain  HPI:  Adam [name] is a 64 y.o. male here as a referral from Dr. Wolfgang Phoenix for right arm pain Past medical history of hypertension, high cholesterol, depression, removal of a cyst in the right arm. The Pain is in the upper right arm. In the triceps and biceps. Denies numbness, tingling, burning. It is a dull pain. Feels worse after working in the garden, a lot of heavy lifting. He is right handed, uses his right arm more. Left arm is completely unaffected. No neck pain or radicular symptoms. Propping it up helps, on something or across the chest helps. Painful lifting it up or letting it hang. Been going on for two months. Not getting worse. It is improving since resting it a little more. Ibuprofen helps. Pain is continuous all day long. He felt it coming on one day after heavy use and then it got worse as he kept using it.   Reviewed notes, labs and imaging from outside physicians, which showed: BMP and CBC unremarkable  MRI of the cervical spine (personally reviewed images and agree with the following)::  1. Diffuse degenerative disc disease from C3-4 through C7-T1. Right foraminal stenosis at C7-T1. 2. Left foraminal stenosis at C3-4, C4-5, C5-6, and C6-7. 3. Slight spinal cord compression at C4-5 to the left of midline. No Myelopathy.  FINDINGS: Bone mineralization is within normal limits for age. Small surgical clips in the distal aspect of the anterior upper arm. Alignment about the right shoulder appears preserved. Mild degenerative changes at the right [facility] joint. Right elbow joint alignment appears preserved. Right humerus intact. No acute osseous abnormality identified.  XR of the humerus: No acute osseous abnormality identified about the right humerus.   Review of Systems: Patient complains of symptoms per  HPI as well as the following symptoms: Numbness, weakness, joint pain, allergies. Pertinent negatives per HPI. All others negative.   History   Social History  . Marital Status: Married    Spouse Name: [name]  . Number of Children: 1  . Years of Education: 12   Occupational History  . disabled    Social History Main Topics  . Smoking status: Former Smoker -- 0.25 packs/day for 15 years    Types: Cigars    Quit date: 04/29/2002  . Smokeless tobacco: Not on file  . Alcohol Use: Yes     Comment: mixed drink 1-2 per mo  . Drug Use: No  . Sexual Activity: Yes    Birth Control/ Protection: None   Other Topics Concern  . Not on file   Social History Narrative   Lives w/ wife   Caffeine use: 1 cup coffee every morning       Family History  Problem Relation Age of Onset  . Colon cancer Maternal Grandmother     OVER 60  . Hypertension Mother   . Diabetes Mother   . Hyperlipidemia Mother   . Thyroid disease Mother   . Hypertension Father   . Diabetes Father   . Stroke Father     Past Medical History  Diagnosis Date  . Eosinophilic granuloma JAN 4854 GASTRIC, followed by Dr. Eugenia Pancoast    EGD JAN 2011, JUN 2011, NOV 2011  . HTN (hypertension)   . Hyperlipemia   . Depression   . BMI 31.0-31.9,adult JUNE 2011 180 LBS   .  GERD (gastroesophageal reflux disease)   . Prostate hypertrophy   . IBS (irritable bowel syndrome)   . Allergy   . Chronic back pain   . Gastric ulcer 08/31/2012    Past Surgical History  Procedure Laterality Date  . Hand surgery  at the age of 80    Brother accidentally cut it  . Ganglion cyst excision      from L wrist and pins placed for Fx involving L thumb  . Back surgery    . Tumor removal  1994    Benign [facility] brain at [facility]  . Cyst removed      from Left leg and knee  . Upper gastrointestinal endoscopy  JAN 2011    GASTRIC ULCER-EOS GRANULOMA  . Upper gastrointestinal endoscopy  JUN 2011    GASTRIC ULCER- EOS, NO H. PYLORI    . Upper gastrointestinal endoscopy  NOV 2011    GASTRIC NODULE, no ulcer- EOS  . Colonoscopy  JAN 2011 COMPLETE    hypoTN and bradycardia w/ Demerol ,Phenergan and Versed, [place] TICS, SML IH  . Colonoscopy  2003    NUR-normal  . Colonoscopy  06/21/09    Fields-sigmoid diverticulosis, sm int hemorrhoids  . Mass excision  04/29/2012    Procedure: EXCISION MASS;  Surgeon: Donato Heinz, MD;  Location: [facility];  Service: General;  Laterality: Right;  Excision of Vascular Mass Right Arm  . Esophagogastroduodenoscopy  05/20/2012    SLF: MILD ESOPHAGITIS.NO BARRETT'S/Multiple ulcers ranging between 3-5 mm/MILD Duodenal inflammation was found in the duodenal bulb  . Esophagogastroduodenoscopy N/A 09/05/2012    SLF: MILD Non-erosive gastritis (inflammation) in the gastric antrum. Biopsies benign, no H. pylori.  . Colonoscopy N/A 10/24/2013    Normal mucosa in the terminal ileum Moderate diverticulosis noted in the sigmoid colonModerate sized internal hemorrhoids. negative random colon bx.. next TCS 10/2023  . Esophagogastroduodenoscopy N/A 10/24/2013    BOF:[id number] DISTAL ESOPHAGEAL WEBSmall hiatal herniaMILD Non-erosive gastritis. SB bx benign. gastric bx, minimal inflammation.   Venia Minks dilation N/A 10/24/2013    Procedure: Venia Minks DILATION;  Surgeon: Danie Binder, MD;  [facility];  Service: Endoscopy;  Laterality: N/A;  . Savory dilation N/A 10/24/2013    Procedure: SAVORY DILATION;  Surgeon: Danie Binder, MD;  Location: [facility];  Service: Endoscopy;  Laterality: N/A;    Current Outpatient Prescriptions  Medication Sig Dispense Refill  . doxazosin (CARDURA) 8 MG tablet TAKE 1 TABLET BY MOUTH AT BEDTIME 30 tablet 5  . enalapril (VASOTEC) 20 MG tablet TAKE 1 TABLET BY MOUTH EVERY DAY 30 tablet 5  . loratadine (CLARITIN) 10 MG tablet Take 10 mg by mouth daily. PRN    . pantoprazole (PROTONIX) 40 MG tablet TAKE 1 TABLET BY MOUTH TWICE A DAY 60 tablet 5  . PARoxetine (PAXIL) 20  MG tablet TAKE 1 TABLET BY MOUTH EVERY DAY 30 tablet 5  . pravastatin (PRAVACHOL) 20 MG tablet TAKE 1 TABLET BY MOUTH AT BEDTIME 30 tablet 5  . verapamil (CALAN-SR) 180 MG CR tablet TAKE 1 TABLET BY MOUTH TWICE A DAY 60 tablet 5  . alfuzosin (UROXATRAL) 10 MG 24 hr tablet Take 10 mg by mouth daily.  3  . gentamicin (GARAMYCIN) 0.3 % ophthalmic solution Place 2 drops into the right eye 4 (four) times daily. (Patient not taking: Reported on 10/31/2014) 5 mL 0   No current facility-administered medications for this visit.    Allergies as of 10/31/2014 - Review  Complete 10/31/2014  Allergen Reaction Noted  . Sulfonamide derivatives      Vitals: BP 129/79 mmHg  Pulse 53  Temp(Src) 97.4 F (36.3 C)  Wt 172 lb 6.4 oz (78.2 kg) Last Weight:  Wt Readings from Last 1 Encounters:  10/31/14 172 lb 6.4 oz (78.2 kg)   Last Height:   Ht Readings from Last 1 Encounters:  10/01/14 5\' 4"  (1.626 m)   Physical exam: Exam: Gen: NAD, conversant, well nourised, well groomed                     CV: RRR, no MRG. No Carotid Bruits. No peripheral edema, warm, nontender Eyes: Conjunctivae clear without exudates or hemorrhage  Neuro: Detailed Neurologic Exam  Speech:    Speech is normal; fluent and spontaneous with normal comprehension.  Cognition:    The patient is oriented to person, place, and time;     recent and remote memory intact;     language fluent;     normal attention, concentration,     fund of knowledge Cranial Nerves:    The pupils are equal, round, and reactive to light. The fundi are normal and spontaneous venous pulsations are present. Visual fields are full to finger confrontation. Extraocular movements are intact. Trigeminal sensation is intact and the muscles of mastication are normal. The face is symmetric. The palate elevates in the midline. Hearing intact. Voice is normal. Shoulder shrug is normal. The tongue has normal motion without fasciculations.   Coordination:     Normal finger to nose and heel to shin. Normal rapid alternating movements.   Gait:    Heel-toe and tandem gait are normal.   Motor Observation:    No asymmetry, no atrophy, and no involuntary movements noted. Tone:    Normal muscle tone.    Posture:    Posture is normal. normal erect    Strength: 4/5 right deltoid.    Strength is V/V in the upper and lower limbs.      Sensation: intact to LT     Reflex Exam:  DTR's:    Deep tendon reflexes in the upper and lower extremities are normal bilaterally.   Toes:    The toes are downgoing bilaterally.   Clonus:    Clonus is absent.   Assessment/Plan:  64 year old patient with 2 months of right upper arm pain. Denies neck pain or radicular symptoms, denies burning or tingling or numbness in the arm. Describes a dull pain in the muscular areas. Exam is unremarkable except for some mild right deltoid weakness. Pain started with heavy lifting in the garden and worsened with use. The arm feels better after a few months of decreased use. Appears to be musculoskeletal in nature or due to overuse. However patient's cervical spine does show degenerative changes so will further examine with an EMG nerve conduction study.  Sarina Ill, MD  [facility] [address] [place], [place] [address]  Phone [phone] Fax [phone]

## 2014-10-31 NOTE — Patient Instructions (Signed)
Overall you are doing fairly well but I do want to suggest a few things today:   Remember to drink plenty of fluid, eat healthy meals and do not skip any meals. Try to eat protein with a every meal and eat a healthy snack such as fruit or nuts in between meals. Try to keep a regular sleep-wake schedule and try to exercise daily, particularly in the form of walking, 20-30 minutes a day, if you can.   As far as diagnostic testing: emg/ncs  I would like to see you back in  One or two weeks for emg/ncs, sooner if we need to. Please call us with any interim questions, concerns, problems, updates or refill requests.   Please also call us for any test results so we can go over those with you on the phone.  My clinical assistant and will answer any of your questions and relay your messages to me and also relay most of my messages to you.   Our phone number is [phone]. We also have an after hours call service for urgent matters and there is a physician on-call for urgent questions. For any emergencies you know to call 911 or go to the nearest emergency room

## 2014-11-06 ENCOUNTER — Encounter (INDEPENDENT_AMBULATORY_CARE_PROVIDER_SITE_OTHER): Payer: Self-pay

## 2014-11-06 ENCOUNTER — Ambulatory Visit (INDEPENDENT_AMBULATORY_CARE_PROVIDER_SITE_OTHER): Payer: Medicare Other | Admitting: Neurology

## 2014-11-06 ENCOUNTER — Ambulatory Visit (INDEPENDENT_AMBULATORY_CARE_PROVIDER_SITE_OTHER): Payer: Self-pay | Admitting: Neurology

## 2014-11-06 DIAGNOSIS — M79601 Pain in right arm: Secondary | ICD-10-CM

## 2014-11-06 DIAGNOSIS — Z0289 Encounter for other administrative examinations: Secondary | ICD-10-CM

## 2014-11-06 DIAGNOSIS — M79604 Pain in right leg: Secondary | ICD-10-CM | POA: Diagnosis not present

## 2014-11-06 DIAGNOSIS — T148XXA Other injury of unspecified body region, initial encounter: Secondary | ICD-10-CM

## 2014-11-06 DIAGNOSIS — M5010 Cervical disc disorder with radiculopathy, unspecified cervical region: Secondary | ICD-10-CM

## 2014-11-06 DIAGNOSIS — X503XXA Overexertion from repetitive movements, initial encounter: Secondary | ICD-10-CM

## 2014-11-06 NOTE — Procedures (Signed)
[facility]    Provider:  Dr Jaynee Eagles Referring Provider: [name], MD Primary Care Physician:  Mickie Hillier, MD  History: Adam [name] is a 64 y.o. male here as a referral from Dr. Wolfgang Phoenix for right arm pain. Past medical history of hypertension, high cholesterol, depression, removal of a cyst in the right arm. The Pain is in the upper right arm. In the triceps and biceps. Denies numbness, tingling, burning. It is a dull pain. Feels worse after working in the garden, a lot of heavy lifting. He is right handed, uses his right arm more. Left arm is completely unaffected. No neck pain or radicular symptoms. Propping right arm up helps, on something or across the chest helps. Painful lifting it up or letting it hang. Been going on for two months. Not getting worse. It is improving since resting it a little more. Ibuprofen helps. Pain is continuous all day long. He felt it coming on one day after heavy use and then it got worse as he kept using it.   Reviewed notes, labs and imaging from outside physicians, which showed: BMP and CBC unremarkable  MRI of the cervical spine (personally reviewed images and agree with the following)::  1. Diffuse degenerative disc disease from C3-4 through C7-T1. Right foraminal stenosis at C7-T1. 2. Left foraminal stenosis at C3-4, C4-5, C5-6, and C6-7. 3. Slight spinal cord compression at C4-5 to the left of midline. No Myelopathy.  FINDINGS: Bone mineralization is within normal limits for age. Small surgical clips in the distal aspect of the anterior upper arm. Alignment about the right shoulder appears preserved. Mild degenerative changes at the right [facility] joint. Right elbow joint alignment appears preserved. Right humerus intact. No acute osseous abnormality identified.  XR of the humerus: No acute osseous abnormality identified about the right humerus.  Summary:   Right median motor nerve showed delayed distal onset  latency(6.43ms, N<4.65ms) and normal F Response latency Right 2nd-digit Median sensory nerve showed delayed distal peak latency(5.7ms, N<3.9) and reduced amplitude(5uv, N>10)  Right Ulnar ADM motor nerve within normal limits with normal F response latency Right Ulnar 5th digit sensory nerve within normal limits.   Needle evaluation of the following muscles were within normal limits: right Deltoid, right Biceps, right Triceps, right Pronator teres, right Opponens Pollicis, right First Dorsal Interosseous, right C6/C7/C8 paraspinal muscles.   Conclusion: There electrophysiologic evidence for moderately-severe right Carpal Tunnel Syndrome however patient seems only mildy affected and this is likely not the cause of his right upper arm pain. No suggestion of polyneuropathy or cervical radiculopathy. Recommend physical therapy of the right arm and MRI of the right humerus and shoulder as clinically warranted.    Sarina Ill, MD  [facility] [address] [place], [place] [address]  Phone [phone] Fax [phone]

## 2014-11-06 NOTE — Progress Notes (Signed)
See procedure note.

## 2014-11-06 NOTE — Progress Notes (Signed)
  [facility]    Provider:  Dr Jaynee Eagles Referring Provider: [name], MD Primary Care Physician:  Mickie Hillier, MD  History: Adam [name] is a 64 y.o. male here as a referral from Dr. Wolfgang Phoenix for right arm pain. Past medical history of hypertension, high cholesterol, depression, removal of a cyst in the right arm. The Pain is in the upper right arm. In the triceps and biceps. Denies numbness, tingling, burning. It is a dull pain. Feels worse after working in the garden, a lot of heavy lifting. He is right handed, uses his right arm more. Left arm is completely unaffected. No neck pain or radicular symptoms. Propping right arm up helps, on something or across the chest helps. Painful lifting it up or letting it hang. Been going on for two months. Not getting worse. It is improving since resting it a little more. Ibuprofen helps. Pain is continuous all day long. He felt it coming on one day after heavy use and then it got worse as he kept using it.   Reviewed notes, labs and imaging from outside physicians, which showed: BMP and CBC unremarkable  MRI of the cervical spine (personally reviewed images and agree with the following)::  1. Diffuse degenerative disc disease from C3-4 through C7-T1. Right foraminal stenosis at C7-T1. 2. Left foraminal stenosis at C3-4, C4-5, C5-6, and C6-7. 3. Slight spinal cord compression at C4-5 to the left of midline. No Myelopathy.  FINDINGS: Bone mineralization is within normal limits for age. Small surgical clips in the distal aspect of the anterior upper arm. Alignment about the right shoulder appears preserved. Mild degenerative changes at the right [facility] joint. Right elbow joint alignment appears preserved. Right humerus intact. No acute osseous abnormality identified.  XR of the humerus: No acute osseous abnormality identified about the right humerus.  Summary:   Right median motor nerve showed delayed distal onset  latency(6.61ms, N<4.72ms) and normal F Response latency Right 2nd-digit Median sensory nerve showed delayed distal peak latency(5.15ms, N<3.9) and reduced amplitude(5uv, N>10)  Right Ulnar ADM motor nerve within normal limits with normal F response latency Right Ulnar 5th digit sensory nerve within normal limits.   Needle evaluation of the following muscles were within normal limits: right Deltoid, right Biceps, right Triceps, right Pronator teres, right Opponens Pollicis, right First Dorsal Interosseous, right C6/C7/C8 paraspinal muscles.   Conclusion: There electrophysiologic evidence for moderately-severe right Carpal Tunnel Syndrome however patient seems only mildy affected and this is likely not the cause of his right upper arm pain. No suggestion of polyneuropathy or cervical radiculopathy. Recommend physical therapy of the right arm and MRI of the right humerus and shoulder as clinically warranted.    Sarina Ill, MD  [facility] [address] [place], [place] [address]  Phone [phone] Fax [phone]

## 2014-11-22 ENCOUNTER — Ambulatory Visit (HOSPITAL_COMMUNITY): Payer: Medicare Other | Attending: Neurology

## 2014-11-22 ENCOUNTER — Encounter (HOSPITAL_COMMUNITY): Payer: Self-pay

## 2014-11-22 DIAGNOSIS — R531 Weakness: Secondary | ICD-10-CM | POA: Insufficient documentation

## 2014-11-22 DIAGNOSIS — M25611 Stiffness of right shoulder, not elsewhere classified: Secondary | ICD-10-CM | POA: Diagnosis not present

## 2014-11-22 DIAGNOSIS — IMO0002 Reserved for concepts with insufficient information to code with codable children: Secondary | ICD-10-CM

## 2014-11-22 DIAGNOSIS — M6289 Other specified disorders of muscle: Secondary | ICD-10-CM | POA: Diagnosis not present

## 2014-11-22 DIAGNOSIS — M25511 Pain in right shoulder: Secondary | ICD-10-CM | POA: Diagnosis not present

## 2014-11-22 DIAGNOSIS — M629 Disorder of muscle, unspecified: Secondary | ICD-10-CM

## 2014-11-22 DIAGNOSIS — R29898 Other symptoms and signs involving the musculoskeletal system: Secondary | ICD-10-CM

## 2014-11-22 DIAGNOSIS — M2560 Stiffness of unspecified joint, not elsewhere classified: Secondary | ICD-10-CM

## 2014-11-22 NOTE — Patient Instructions (Addendum)
  Flexibility: Corner Stretch   Standing in corner with hands just above shoulder level and feet ____ inches from corner, lean forward until a comfortable stretch is felt across chest. Hold __10__ seconds. Repeat __3__ times per set. Do _1___ sets per session. Do _2-3___ sessions per day.  http://orth.exer.us/342   Copyright  VHI. All rights reserved.   Scapular Retraction (Standing)   With arms at sides, pinch shoulder blades together. Repeat _10___ times per set. Do __1__ sets per session. Do __2-3__ sessions per day.  http://orth.exer.us/944   Copyright  VHI. All rights reserved.   ROM: Towel Stretch - with Interior Rotation   Pull left arm up behind back by pulling towel up with other arm. Hold _10___ seconds. Repeat __3__ times per set. Do _1___ sets per session. Do _2-3___ sessions per day.  http://orth.exer.us/888   Copyright  VHI. All rights reserved.   Posterior Capsule Stretch   Stand or sit, one arm across body so hand rests over opposite shoulder. Gently push on crossed elbow with other hand until stretch is felt in shoulder of crossed arm. Hold _10__ seconds.  Repeat _3__ times per session. Do _2-3__ sessions per day.  Copyright  VHI. All rights reserved.   Flexors Stretch, Standing   Stand near wall and slide arm up, with palm facing away from wall, by leaning toward wall. Hold _10__ seconds.  Repeat _3__ times per session. Do _2-3__ sessions per day.   Copyright  VHI. All rights reserved.   (Home) Extension: Isometric / Bilateral Arm Retraction - Sitting   Facing anchor, hold hands and elbow at shoulder height, with elbow bent.  Pull arms back to squeeze shoulder blades together. Repeat 10-15 times.  Copyright  VHI. All rights reserved.   (Home) Retraction: Row - Bilateral (Anchor)   Facing anchor, arms reaching forward, pull hands toward stomach, keeping elbows bent and at your sides and pinching shoulder blades together. Repeat 10-15  times.  Copyright  VHI. All rights reserved.   (Clinic) Extension / Flexion (Assist)   Face anchor, pull arms back, keeping elbow straight, and squeze shoulder blades together. Repeat 10-15 times.   Copyright  VHI. All rights reserved.

## 2014-11-22 NOTE — Therapy (Signed)
[place], [place], [address] Phone: [phone]   Fax:  [phone]  Occupational Therapy Evaluation  Patient Details  Name: Adam [name] MRN: [id number] Date of Birth: September 18, 1950 Referring Provider:  Melvenia Beam, MD  Encounter Date: 11/22/2014      OT End of Session - 11/22/14 1153    Visit Number 1   Number of Visits 12   Date for OT Re-Evaluation 01/21/15  Mini- reasses: 12/20/14   Authorization Type Medicare Part A   Authorization Time Period before 10th visit   Authorization - Visit Number 1   Authorization - Number of Visits 10   OT Start Time 1015   OT Stop Time 1100   OT Time Calculation (min) 45 min   Activity Tolerance Patient tolerated treatment well   Behavior During Therapy [facility] for tasks assessed/performed      Past Medical History  Diagnosis Date  . Eosinophilic granuloma JAN 3235 GASTRIC, followed by Dr. Eugenia Pancoast    EGD JAN 2011, JUN 2011, NOV 2011  . HTN (hypertension)   . Hyperlipemia   . Depression   . BMI 31.0-31.9,adult JUNE 2011 180 LBS   . GERD (gastroesophageal reflux disease)   . Prostate hypertrophy   . IBS (irritable bowel syndrome)   . Allergy   . Chronic back pain   . Gastric ulcer 08/31/2012    Past Surgical History  Procedure Laterality Date  . Hand surgery  at the age of 59    Brother accidentally cut it  . Ganglion cyst excision      from L wrist and pins placed for Fx involving L thumb  . Back surgery    . Tumor removal  1994    Benign [facility] brain at [facility]  . Cyst removed      from Left leg and knee  . Upper gastrointestinal endoscopy  JAN 2011    GASTRIC ULCER-EOS GRANULOMA  . Upper gastrointestinal endoscopy  JUN 2011    GASTRIC ULCER- EOS, NO H. PYLORI  . Upper gastrointestinal endoscopy  NOV 2011    GASTRIC NODULE, no ulcer- EOS  . Colonoscopy  JAN 2011 COMPLETE    hypoTN and bradycardia w/ Demerol ,Phenergan and Versed, [place] TICS,  SML IH  . Colonoscopy  2003    NUR-normal  . Colonoscopy  06/21/09    Fields-sigmoid diverticulosis, sm int hemorrhoids  . Mass excision  04/29/2012    Procedure: EXCISION MASS;  Surgeon: Donato Heinz, MD;  Location: [facility];  Service: General;  Laterality: Right;  Excision of Vascular Mass Right Arm  . Esophagogastroduodenoscopy  05/20/2012    SLF: MILD ESOPHAGITIS.NO BARRETT'S/Multiple ulcers ranging between 3-5 mm/MILD Duodenal inflammation was found in the duodenal bulb  . Esophagogastroduodenoscopy N/A 09/05/2012    SLF: MILD Non-erosive gastritis (inflammation) in the gastric antrum. Biopsies benign, no H. pylori.  . Colonoscopy N/A 10/24/2013    Normal mucosa in the terminal ileum Moderate diverticulosis noted in the sigmoid colonModerate sized internal hemorrhoids. negative random colon bx.. next TCS 10/2023  . Esophagogastroduodenoscopy N/A 10/24/2013    TDD:UKGURKYH DISTAL ESOPHAGEAL WEBSmall hiatal herniaMILD Non-erosive gastritis. SB bx benign. gastric bx, minimal inflammation.   Venia Minks dilation N/A 10/24/2013    Procedure: Venia Minks DILATION;  Surgeon: Danie Binder, MD;  Location: [facility];  Service: Endoscopy;  Laterality: N/A;  . Savory dilation N/A 10/24/2013    Procedure: SAVORY DILATION;  Surgeon: Danie Binder, MD;  Location: [facility];  Service: Endoscopy;  Laterality: N/A;    There were no vitals filed for this visit.  Visit Diagnosis:  Right shoulder pain - Plan: Ot plan of care cert/re-cert  Tight fascia - Plan: Ot plan of care cert/re-cert  Stiffness of joint of upper extremity - Plan: Ot plan of care cert/re-cert  Weakness of shoulder - Plan: Ot plan of care cert/re-cert      Subjective Assessment - 11/22/14 1024    Subjective  S: I do a lot of yard work and heavy lifting and one day it was just hurting me.    Pertinent History Patient is a 64 y/o male s/p right shoulder and bicep pain which began in April 2016. Pain is believed to be caused from  heavy lifting and yardwork. Pt is not taking anything for pain. Pain is constant although it just will worsen or let up depending on his daily activity. X Ray and nerve study completed with no results.  Dr. Jaynee Eagles has referred patient to occupational therapy for evaluation and treatment.    Special Tests FOTO score: 49/100   Patient Stated Goals To decrease pain.   Currently in Pain? Yes   Pain Score 2    Pain Location Arm   Pain Orientation Right   Pain Descriptors / Indicators Dull;Aching   Pain Type Acute pain   Pain Onset More than a month ago   Pain Frequency Constant           [facility] OT Assessment - 11/22/14 1013    Assessment   Diagnosis Right arm pain   Onset Date --  April 2016   Assessment --  No follow up scheduled   Prior Therapy None   Precautions   Precautions None   Type of Shoulder Precautions Pt states that he has been encouraged to not perform any lifting until the pain goes away.   Restrictions   Weight Bearing Restrictions No   Balance Screen   Has the patient fallen in the past 6 months No   Home  Environment   Family/patient expects to be discharged to: Private residence   Living Arrangements Spouse/significant other   Prior Function   Level of [employer] at home   [employer]   ADL   ADL comments Difficulty with perfoming any time of stability tasks and heavy lifting.    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    ROM / Strength   AROM / PROM / Strength AROM;Strength;PROM   Palpation   Palpation comment Mod fascial restrictions in right bicep, medial and anterior deltoid, upper and mid trapezius  and scapularis region.    AROM   Overall AROM Comments Assessed seated. IR/ER adducted   AROM Assessment Site Shoulder;Elbow   Right/Left Shoulder Right   Right Shoulder Flexion 142 Degrees  pain   Right Shoulder ABduction 135 Degrees  pain   Right Shoulder Internal Rotation 90 Degrees   Right Shoulder External Rotation 90 Degrees  pain   Right/Left Elbow --  Elbow flexion and extension is WNL.   PROM   PROM Assessment Site --   Right/Left Shoulder --  Strength   Overall Strength Comments Assessed seated. IR/ER adducted   Strength Assessment Site Shoulder   Right/Left Shoulder Right   Right Shoulder Flexion 4-/5   Right Shoulder ABduction 4-/5   Right Shoulder Internal Rotation 4-/5   Right Shoulder External Rotation 4/5                  OT Treatments/Exercises (OP) - 11/22/14 1206    Exercises   Exercises Shoulder   Shoulder Exercises: Standing   Extension Theraband;10 reps   Theraband Level (Shoulder Extension) Level 2 (Red)   Row Theraband;10 reps   Theraband Level (Shoulder Row) Level 2 (Red)   Retraction Theraband;10 reps   Theraband Level (Shoulder Retraction) Level 2 (Red)               OT Education - 11/22/14 1152    Education provided Yes   Education Details Scapular theraband exercises and shoulder stretches   Person(s) Educated Patient   Methods Explanation;Demonstration;Handout   Comprehension Returned demonstration;Verbalized understanding          OT Short Term Goals - 11/22/14 1159    OT SHORT TERM GOAL #1   Title Patient will be educated and independent with HEP.   Time 3   Status New   OT SHORT TERM GOAL #2   Title Patient will increase AROM to WNL to increase ability to complete work tasks overhead with less difficulty.    Time 3   Period Weeks   Status New   OT SHORT TERM GOAL #3   Title Patient will decrease pain in RUE to 5/10 when completing lifting tasks.    Time 3   Period Weeks   Status New   OT SHORT TERM  GOAL #4   Title Patient will increase strength to 4+/5 to increase ability to complete daily yardwork tasks.   Time 3   Period Weeks   Status New   OT SHORT TERM GOAL #5   Title Patient will decrease fascial restrictions to Min Amount.    Time 3   Period Weeks   Status New           OT Long Term Goals - 11/22/14 1203    OT LONG TERM GOAL #1   Title Patient will return to highest level of independence with all daily and work tasks.   Time 6   Period Weeks   Status New   OT LONG TERM GOAL #2   Title Patient will increase RUE strength to 5/5 to increase ability to lift heavy items at home.   Time 6   Period Weeks   Status New   OT LONG TERM GOAL #3   Title Patient will increase RUE shoulder stability by being able to complete dynamic yardwork tasks with less difficulty.    Time 6   Period Weeks   Status New   OT LONG TERM GOAL #4   Title Patient will decrease fascial restrictions to trace amount.                Plan - 11/22/14 1154    Clinical Impression Statement A: Patient is a 64 y/o male s/p right shoulder and bicep pain causing increased pain and fascial restrictions and decreased strength and ROM resulting in difficulty completing functional daily and work tasks at home.    Pt will benefit from skilled therapeutic intervention in order to improve on the following deficits (Retired) Decreased range of motion;Pain;Decreased strength;Increased fascial restricitons;Impaired UE functional use  Rehab Potential Excellent   OT Frequency 2x / week   OT Duration 6 weeks   OT Treatment/Interventions Therapeutic exercise;Patient/family education;Manual Therapy;Ultrasound;Therapeutic activities;Cryotherapy;Electrical Stimulation;Moist Heat;Passive range of motion   Plan P: Pt will benefit from skilled OT services to increase functional performance during daily and work tasks using RUE as dominant. Treatment Plan: Myofascial release, muscle energy technique, shoulder  stabilization exercises, AROM, and general strengthening.    Consulted and Agree with Plan of Care Patient          G-Codes - 10-Dec-2014 1205    Functional Assessment Tool Used FOTO score: 49/100 (51% impaired)   Functional Limitation Carrying, moving and handling objects   Carrying, Moving and Handling Objects Current Status [phone]) At least 40 percent but less than 60 percent impaired, limited or restricted   Carrying, Moving and Handling Objects Goal Status ([id number]) At least 1 percent but less than 20 percent impaired, limited or restricted      Problem List Patient Active Problem List   Diagnosis Date Noted  . Right arm pain 10/31/2014  . Overuse injury 10/31/2014  . Cervical disc disorder with radiculopathy of cervical region 10/31/2014  . Change in bowel habits 10/19/2013  . IBS (irritable bowel syndrome) 10/19/2013  . Back pain 01/02/2013  . Hyperlipidemia LDL goal <130 09/20/2012  . Essential hypertension, benign 09/20/2012  . GERD (gastroesophageal reflux disease) 12/30/2011  . GRANULOMA 08/27/2009  . DYSPEPSIA 06/19/2009  . Dysphagia 06/17/2009  . PROSTATITIS, HX OF 06/17/2009    Ailene Ravel, OTR/L,CBIS  [phone]  10-Dec-2014, 12:08 PM  [place] [address] [place], [place], [address] Phone: [phone]   Fax:  [phone]

## 2014-11-23 ENCOUNTER — Ambulatory Visit (HOSPITAL_COMMUNITY): Payer: Medicare Other | Admitting: Specialist

## 2014-11-23 DIAGNOSIS — M629 Disorder of muscle, unspecified: Secondary | ICD-10-CM

## 2014-11-23 DIAGNOSIS — M25511 Pain in right shoulder: Secondary | ICD-10-CM | POA: Diagnosis not present

## 2014-11-23 DIAGNOSIS — M25611 Stiffness of right shoulder, not elsewhere classified: Secondary | ICD-10-CM | POA: Diagnosis not present

## 2014-11-23 DIAGNOSIS — R531 Weakness: Secondary | ICD-10-CM | POA: Diagnosis not present

## 2014-11-23 DIAGNOSIS — M6289 Other specified disorders of muscle: Secondary | ICD-10-CM | POA: Diagnosis not present

## 2014-11-23 NOTE — Therapy (Signed)
[place], [place], [address] Phone: [phone]   Fax:  [phone]  Occupational Therapy Treatment  Patient Details  Name: CUTBERTO WINFREE MRN: [id number] Date of Birth: 1950-08-15 Referring Provider:  Melvenia Beam, MD  Encounter Date: 11/23/2014      OT End of Session - 11/23/14 0952    Visit Number 2   Number of Visits 12   Date for OT Re-Evaluation 01/21/15  mini reassess on 7/14   Authorization Type Medicare A and B   Authorization Time Period before 10th visit   Authorization - Visit Number 2   Authorization - Number of Visits 10   OT Start Time [phone]   OT Stop Time 0932   OT Time Calculation (min) 42 min   Activity Tolerance Patient tolerated treatment well   Behavior During Therapy [facility] for tasks assessed/performed      Past Medical History  Diagnosis Date  . Eosinophilic granuloma JAN 1324 GASTRIC, followed by Dr. Eugenia Pancoast    EGD JAN 2011, JUN 2011, NOV 2011  . HTN (hypertension)   . Hyperlipemia   . Depression   . BMI 31.0-31.9,adult JUNE 2011 180 LBS   . GERD (gastroesophageal reflux disease)   . Prostate hypertrophy   . IBS (irritable bowel syndrome)   . Allergy   . Chronic back pain   . Gastric ulcer 08/31/2012    Past Surgical History  Procedure Laterality Date  . Hand surgery  at the age of 35    Brother accidentally cut it  . Ganglion cyst excision      from L wrist and pins placed for Fx involving L thumb  . Back surgery    . Tumor removal  1994    Benign from brain at [facility]  . Cyst removed      from Left leg and knee  . Upper gastrointestinal endoscopy  JAN 2011    GASTRIC ULCER-EOS GRANULOMA  . Upper gastrointestinal endoscopy  JUN 2011    GASTRIC ULCER- EOS, NO H. PYLORI  . Upper gastrointestinal endoscopy  NOV 2011    GASTRIC NODULE, no ulcer- EOS  . Colonoscopy  JAN 2011 COMPLETE    hypoTN and bradycardia w/ Demerol ,Phenergan and Versed, [place] TICS,  SML IH  . Colonoscopy  2003    NUR-normal  . Colonoscopy  06/21/09    Fields-sigmoid diverticulosis, sm int hemorrhoids  . Mass excision  04/29/2012    Procedure: EXCISION MASS;  Surgeon: Donato Heinz, MD;  Location: [facility] ORS;  Service: General;  Laterality: Right;  Excision of Vascular Mass Right Arm  . Esophagogastroduodenoscopy  05/20/2012    SLF: MILD ESOPHAGITIS.NO BARRETT'S/Multiple ulcers ranging between 3-5 mm/MILD Duodenal inflammation was found in the duodenal bulb  . Esophagogastroduodenoscopy N/A 09/05/2012    SLF: MILD Non-erosive gastritis (inflammation) in the gastric antrum. Biopsies benign, no H. pylori.  . Colonoscopy N/A 10/24/2013    Normal mucosa in the terminal ileum Moderate diverticulosis noted in the sigmoid colonModerate sized internal hemorrhoids. negative random colon bx.. next TCS 10/2023  . Esophagogastroduodenoscopy N/A 10/24/2013    MWN:[id number] DISTAL ESOPHAGEAL WEBSmall hiatal herniaMILD Non-erosive gastritis. SB bx benign. gastric bx, minimal inflammation.   Venia Minks dilation N/A 10/24/2013    Procedure: Venia Minks DILATION;  Surgeon: Danie Binder, MD;  Location: [facility];  Service: Endoscopy;  Laterality: N/A;  . Savory dilation N/A 10/24/2013    Procedure: SAVORY DILATION;  Surgeon: Marga Melnick  Fields, MD;  Location: [facility];  Service: Endoscopy;  Laterality: N/A;    There were no vitals filed for this visit.  Visit Diagnosis:  Right shoulder pain  Tight fascia      Subjective Assessment - 11/23/14 0854    Subjective  S:  I am a little stiff from yesterday's appointment.   Currently in Pain? Yes   Pain Score 3    Pain Location Arm   Pain Orientation Right   Pain Descriptors / Indicators Aching;Dull   Pain Type Acute pain            [facility] OT Assessment - 11/23/14 0001    Assessment   Diagnosis Right arm pain   Prior Therapy None   Precautions   Precautions None                  OT Treatments/Exercises (OP) - 11/23/14  0001    Exercises   Exercises Shoulder;Elbow   Shoulder Exercises: Supine   Protraction AROM;10 reps   Horizontal ABduction AROM;10 reps   External Rotation AROM;10 reps   Internal Rotation AROM;10 reps   Flexion AROM;10 reps   ABduction AROM;10 reps   Other Supine Exercises serratus anterior punch 10 times   Shoulder Exercises: Standing   Extension Theraband;10 reps   Theraband Level (Shoulder Extension) Level 3 (Green)   Row [employer] reps   Theraband Level (Shoulder Row) Level 3 (Green)   Retraction Theraband;10 reps   Theraband Level (Shoulder Retraction) Level 3 (Green)   Shoulder Exercises: ROM/Strengthening   UBE (Upper Arm Bike) 3 minutes at 1.0 in reverse for scapular stability   Wall Wash 1'   Elbow Exercises   Other elbow exercises bicep/tricep stretch with arm extended in front of body 10 seconds X 2   Manual Therapy   Manual Therapy Myofascial release   Myofascial Release Myofascial release to right upper arm and associated areas to decrease pain and restrictions and improve pain free mobility.  MFR to scapular region and triceps region, with PROM abduction, patient had significant restriction/knot in mid tricep region, with arm at 90 abd performed MFR to this region to decrease restrictions                OT Education - 11/23/14 0952    Education Details issued a copy of POC   Person(s) Educated Patient   Methods Explanation;Handout   Comprehension Verbalized understanding          OT Short Term Goals - 11/23/14 0954    OT SHORT TERM GOAL #1   Title Patient will be educated and independent with HEP.   Time 3   Period Weeks   Status On-going   OT SHORT TERM GOAL #2   Title Patient will increase AROM to WNL to increase ability to complete work tasks overhead with less difficulty.    Time 3   Period Weeks   Status On-going   OT SHORT TERM GOAL #3   Title Patient will decrease pain in RUE to 5/10 when completing lifting tasks.    Time 3    Period Weeks   Status On-going   OT SHORT TERM GOAL #4   Title Patient will increase strength to 4+/5 to increase ability to complete daily yardwork tasks.   Time 3   Period Weeks   Status On-going   OT SHORT TERM GOAL #5   Title Patient will decrease fascial restrictions to Min Amount.    Time 3  Period Weeks   Status On-going           OT Long Term Goals - 11/23/14 0955    OT LONG TERM GOAL #1   Title Patient will return to highest level of independence with all daily and work tasks.   Time 6   Period Weeks   Status On-going   OT LONG TERM GOAL #2   Title Patient will increase RUE strength to 5/5 to increase ability to lift heavy items at home.   Time 6   Period Weeks   Status On-going   OT LONG TERM GOAL #3   Title Patient will increase RUE shoulder stability by being able to complete dynamic yardwork tasks with less difficulty.    Time 6   Period Weeks   Status On-going   OT LONG TERM GOAL #4   Title Patient will decrease fascial restrictions to trace amount.    Time 6   Period Weeks   Status On-going               Plan - 11/23/14 6283    Clinical Impression Statement A:  Patient has significant muscle restriction/spasm in mid tricep region of right arm.  Restriction decreased somewhat after MFR.   Plan P:  Add 1# to supine exercises, add prone scapular stability exercises.          G-Codes - 2014/12/07 1205    Functional Assessment Tool Used FOTO score: 49/100 (51% impaired)   Functional Limitation Carrying, moving and handling objects   Carrying, Moving and Handling Objects Current Status [phone]) At least 40 percent but less than 60 percent impaired, limited or restricted   Carrying, Moving and Handling Objects Goal Status (H6073) At least 1 percent but less than 20 percent impaired, limited or restricted      Problem List Patient Active Problem List   Diagnosis Date Noted  . Right arm pain 10/31/2014  . Overuse injury 10/31/2014  . Cervical  disc disorder with radiculopathy of cervical region 10/31/2014  . Change in bowel habits 10/19/2013  . IBS (irritable bowel syndrome) 10/19/2013  . Back pain 01/02/2013  . Hyperlipidemia LDL goal <130 09/20/2012  . Essential hypertension, benign 09/20/2012  . GERD (gastroesophageal reflux disease) 12/30/2011  . GRANULOMA 08/27/2009  . DYSPEPSIA 06/19/2009  . Dysphagia 06/17/2009  . Hope Budds OF 06/17/2009    Vangie Bicker, OTR/L [phone]  11/23/2014, 9:56 AM  [facility] [address] [place], [place], [address] Phone: [phone]   Fax:  [phone]

## 2014-11-27 ENCOUNTER — Encounter (HOSPITAL_COMMUNITY): Payer: Self-pay

## 2014-11-27 ENCOUNTER — Ambulatory Visit (HOSPITAL_COMMUNITY): Payer: Medicare Other

## 2014-11-27 DIAGNOSIS — M6289 Other specified disorders of muscle: Secondary | ICD-10-CM

## 2014-11-27 DIAGNOSIS — M25511 Pain in right shoulder: Secondary | ICD-10-CM | POA: Diagnosis not present

## 2014-11-27 DIAGNOSIS — M2560 Stiffness of unspecified joint, not elsewhere classified: Secondary | ICD-10-CM

## 2014-11-27 DIAGNOSIS — R531 Weakness: Secondary | ICD-10-CM | POA: Diagnosis not present

## 2014-11-27 DIAGNOSIS — R29898 Other symptoms and signs involving the musculoskeletal system: Secondary | ICD-10-CM

## 2014-11-27 DIAGNOSIS — M25611 Stiffness of right shoulder, not elsewhere classified: Secondary | ICD-10-CM | POA: Diagnosis not present

## 2014-11-27 DIAGNOSIS — M629 Disorder of muscle, unspecified: Secondary | ICD-10-CM

## 2014-11-27 DIAGNOSIS — IMO0002 Reserved for concepts with insufficient information to code with codable children: Secondary | ICD-10-CM

## 2014-11-27 NOTE — Therapy (Signed)
[place], [place], [address] Phone: [phone]   Fax:  [phone]  Occupational Therapy Treatment  Patient Details  Name: Adam [name] MRN: [id number] Date of Birth: 12/02/1950 Referring Provider:  [name], MD  Encounter Date: 11/27/2014      OT End of Session - 11/27/14 1742    Visit Number 3   Number of Visits 12   Date for OT Re-Evaluation 01/21/15  mini reassess on 7/14   Authorization Type Medicare A and B   Authorization Time Period before 10th visit   Authorization - Visit Number 3   Authorization - Number of Visits 10   OT Start Time 1645   OT Stop Time 1730   OT Time Calculation (min) 45 min   Activity Tolerance Patient tolerated treatment well   Behavior During Therapy [facility] for tasks assessed/performed      Past Medical History  Diagnosis Date  . Eosinophilic granuloma JAN 3825 GASTRIC, followed by Dr. Eugenia Pancoast    EGD JAN 2011, JUN 2011, NOV 2011  . HTN (hypertension)   . Hyperlipemia   . Depression   . BMI 31.0-31.9,adult JUNE 2011 180 LBS   . GERD (gastroesophageal reflux disease)   . Prostate hypertrophy   . IBS (irritable bowel syndrome)   . Allergy   . Chronic back pain   . Gastric ulcer 08/31/2012    Past Surgical History  Procedure Laterality Date  . Hand surgery  at the age of 39    Brother accidentally cut it  . Ganglion cyst excision      from L wrist and pins placed for Fx involving L thumb  . Back surgery    . Tumor removal  1994    Benign from brain at [facility]  . Cyst removed      from Left leg and knee  . Upper gastrointestinal endoscopy  JAN 2011    GASTRIC ULCER-EOS GRANULOMA  . Upper gastrointestinal endoscopy  JUN 2011    GASTRIC ULCER- EOS, NO H. PYLORI  . Upper gastrointestinal endoscopy  NOV 2011    GASTRIC NODULE, no ulcer- EOS  . Colonoscopy  JAN 2011 COMPLETE    hypoTN and bradycardia w/ Demerol ,Phenergan and Versed, [place] TICS,  SML IH  . Colonoscopy  2003    NUR-normal  . Colonoscopy  06/21/09    Fields-sigmoid diverticulosis, sm int hemorrhoids  . Mass excision  04/29/2012    Procedure: EXCISION MASS;  Surgeon: Donato Heinz, MD;  Location: [facility];  Service: General;  Laterality: Right;  Excision of Vascular Mass Right Arm  . Esophagogastroduodenoscopy  05/20/2012    SLF: MILD ESOPHAGITIS.NO BARRETT'S/Multiple ulcers ranging between 3-5 mm/MILD Duodenal inflammation was found in the duodenal bulb  . Esophagogastroduodenoscopy N/A 09/05/2012    SLF: MILD Non-erosive gastritis (inflammation) in the gastric antrum. Biopsies benign, no H. pylori.  . Colonoscopy N/A 10/24/2013    Normal mucosa in the terminal ileum Moderate diverticulosis noted in the sigmoid colonModerate sized internal hemorrhoids. negative random colon bx.. next TCS 10/2023  . Esophagogastroduodenoscopy N/A 10/24/2013    KNL:[id number] DISTAL ESOPHAGEAL WEBSmall hiatal herniaMILD Non-erosive gastritis. SB bx benign. gastric bx, minimal inflammation.   Venia Minks dilation N/A 10/24/2013    Procedure: Venia Minks DILATION;  Surgeon: Danie Binder, MD;  [facility];  Service: Endoscopy;  Laterality: N/A;  . Savory dilation N/A 10/24/2013    Procedure: SAVORY DILATION;  Surgeon: Marga Melnick  Fields, MD;  Location: [facility];  Service: Endoscopy;  Laterality: N/A;    There were no vitals filed for this visit.  Visit Diagnosis:  Tight fascia  Stiffness of joint of upper extremity  Weakness of shoulder      Subjective Assessment - 11/27/14 1656    Subjective  S: My shoulder is doing much better today.   Currently in Pain? No/denies            [facility] OT Assessment - 11/27/14 1657    Assessment   Diagnosis Right arm pain   Precautions   Precautions None                  OT Treatments/Exercises (OP) - 11/27/14 1657    Exercises   Exercises Shoulder;Elbow   Shoulder Exercises: Supine   Protraction AROM;10  reps;Strengthening;15 reps   Protraction Weight (lbs) 1   Horizontal ABduction PROM;Strengthening;15 reps   Horizontal ABduction Weight (lbs) 1   External Rotation PROM;Strengthening;15 reps   External Rotation Weight (lbs) 1   Internal Rotation PROM;10 reps;Strengthening;15 reps   Internal Rotation Weight (lbs) 1   Flexion PROM;10 reps;Strengthening;15 reps   Shoulder Flexion Weight (lbs) 1   ABduction PROM;10 reps;Strengthening;15 reps   Shoulder ABduction Weight (lbs) 1   Other Supine Exercises serratus anterior punch 15 times; 1#   Other Supine Exercises Arm circles; 5X forward 5X reverse; 1#   Shoulder Exercises: Prone   Retraction Strengthening;15 reps   Retraction Weight (lbs) 1   Flexion Strengthening;15 reps   Flexion Weight (lbs) 1   Extension Strengthening;15 reps   Extension Weight (lbs) 1   External Rotation Strengthening;15 reps   External Rotation Weight (lbs) 1   Internal Rotation Strengthening;15 reps   Internal Rotation Weight (lbs) 1   Other Prone Exercises [place] exercises; 10X; 5 positions; 1#   Other Prone Exercises Angel wings; 10X   Shoulder Exercises: Standing   Protraction Strengthening;15 reps   Protraction Weight (lbs) 1   Horizontal ABduction Strengthening;15 reps   Horizontal ABduction Weight (lbs) 1   External Rotation Strengthening;15 reps   External Rotation Weight (lbs) 1   Internal Rotation Strengthening;15 reps   Internal Rotation Weight (lbs) 1   Flexion Strengthening;15 reps   Shoulder Flexion Weight (lbs) 1   ABduction Strengthening;15 reps   Shoulder ABduction Weight (lbs) 1   Shoulder Exercises: ROM/Strengthening   UBE (Upper Arm Bike) 3 minutes at 1.0 in reverse for scapular stability   X to V Arms 15X with 1#   Proximal Shoulder Strengthening, Supine 15X with one rest break   Manual Therapy   Manual Therapy Myofascial release   Myofascial Release Myofascial release to right upper arm and associated areas to decrease pain and  restrictions and improve pain free mobility.  MFR to scapular region and triceps region, with PROM abduction, patient had significant restriction/knot in mid tricep region, with arm at 90 abd performed MFR to this region to decrease restrictions                  OT Short Term Goals - 11/23/14 0954    OT SHORT TERM GOAL #1   Title Patient will be educated and independent with HEP.   Time 3   Period Weeks   Status On-going   OT SHORT TERM GOAL #2   Title Patient will increase AROM to WNL to increase ability to complete work tasks overhead with less difficulty.    Time 3   Period  Weeks   Status On-going   OT SHORT TERM GOAL #3   Title Patient will decrease pain in RUE to 5/10 when completing lifting tasks.    Time 3   Period Weeks   Status On-going   OT SHORT TERM GOAL #4   Title Patient will increase strength to 4+/5 to increase ability to complete daily yardwork tasks.   Time 3   Period Weeks   Status On-going   OT SHORT TERM GOAL #5   Title Patient will decrease fascial restrictions to Min Amount.    Time 3   Period Weeks   Status On-going           OT Long Term Goals - 11/23/14 0955    OT LONG TERM GOAL #1   Title Patient will return to highest level of independence with all daily and work tasks.   Time 6   Period Weeks   Status On-going   OT LONG TERM GOAL #2   Title Patient will increase RUE strength to 5/5 to increase ability to lift heavy items at home.   Time 6   Period Weeks   Status On-going   OT LONG TERM GOAL #3   Title Patient will increase RUE shoulder stability by being able to complete dynamic yardwork tasks with less difficulty.    Time 6   Period Weeks   Status On-going   OT LONG TERM GOAL #4   Title Patient will decrease fascial restrictions to trace amount.    Time 6   Period Weeks   Status On-going               Plan - 11/27/14 1742    Clinical Impression Statement A: Added 1# hand weight to supine and standing. Added  prone shoulder strengthening exercises. Patient tolerated well with some muscle fatigue noted.   Plan P: Add ball on the wall and continue with shoulder strengthening exercises.        Problem List Patient Active Problem List   Diagnosis Date Noted  . Right arm pain 10/31/2014  . Overuse injury 10/31/2014  . Cervical disc disorder with radiculopathy of cervical region 10/31/2014  . Change in bowel habits 10/19/2013  . IBS (irritable bowel syndrome) 10/19/2013  . Back pain 01/02/2013  . Hyperlipidemia LDL goal <130 09/20/2012  . Essential hypertension, benign 09/20/2012  . GERD (gastroesophageal reflux disease) 12/30/2011  . GRANULOMA 08/27/2009  . DYSPEPSIA 06/19/2009  . Dysphagia 06/17/2009  . Hope Budds OF 06/17/2009    Ailene Ravel, OTR/L,CBIS  [phone]  11/27/2014, 5:44 PM  [facility] [address] [place], [place], [address] Phone: [phone]   Fax:  [phone]

## 2014-11-28 ENCOUNTER — Encounter: Payer: Self-pay | Admitting: Gastroenterology

## 2014-11-28 ENCOUNTER — Ambulatory Visit (INDEPENDENT_AMBULATORY_CARE_PROVIDER_SITE_OTHER): Payer: Medicare Other | Admitting: Gastroenterology

## 2014-11-28 VITALS — BP 128/84 | HR 62 | Temp 98.2°F | Ht 64.0 in | Wt 171.8 lb

## 2014-11-28 DIAGNOSIS — K219 Gastro-esophageal reflux disease without esophagitis: Secondary | ICD-10-CM

## 2014-11-28 DIAGNOSIS — L980 Pyogenic granuloma: Secondary | ICD-10-CM

## 2014-11-28 DIAGNOSIS — R1310 Dysphagia, unspecified: Secondary | ICD-10-CM | POA: Diagnosis not present

## 2014-11-28 DIAGNOSIS — K589 Irritable bowel syndrome without diarrhea: Secondary | ICD-10-CM | POA: Diagnosis not present

## 2014-11-28 MED ORDER — PANTOPRAZOLE SODIUM 40 MG PO TBEC: 40.0000 mg | DELAYED_RELEASE_TABLET | Freq: Two times a day (BID) | ORAL | Status: DC

## 2014-11-28 NOTE — Patient Instructions (Signed)
COMPLETE LAB TODAY. YOUR RESULTS WILL BE AVAILABLE IN MY CHART AFTER JUN 24 AND MY OFFICE WILL CONTACT YOU IN 10-14 DAYS WITH YOUR RESULTS.   CONTINUE PROTONIX. TAKE 30 MINUTES PRIOR TO BREAKFAST.   PLEASE CALL WITH QUESTIONS OR CONCERNS.  FOLLOW UP IN 1 YEAR. MERRY CHRISTMAS AND HAPPY NEW YEAR!

## 2014-11-28 NOTE — Assessment & Plan Note (Signed)
SYMPTOMS CONTROLLED/RESOLVED.  CONTINUE TO MONITOR SYMPTOMS. 

## 2014-11-28 NOTE — Assessment & Plan Note (Addendum)
SYMPTOMS CONTROLLED/RESOLVED.  CONTINUE PROTONIX DAILY. CBC TODAY NO INDICATION FOR ENDOSCOPY AT THIS TIME. OPV IN 1 YR.

## 2014-11-28 NOTE — Progress Notes (Signed)
Subjective:    Patient ID: Adam [name], male    DOB: 1951/03/03, 64 y.o.   MRN: [id number]  Mickie Hillier, MD  HPI DOING GOOD. NO PAINS OR DIARRHEA. BMs: 2X/DAY: NL  PT DENIES FEVER, CHILLS, HEMATOCHEZIA, nausea, vomiting, melena, diarrhea, CHEST PAIN, SHORTNESS OF BREATH,  CHANGE IN BOWEL IN HABITS, constipation, abdominal pain, problems swallowing, OR heartburn or indigestion.    Past Medical History  Diagnosis Date  . Eosinophilic granuloma JAN 2355 GASTRIC, followed by Dr. Eugenia Pancoast    EGD JAN 2011, JUN 2011, NOV 2011  . HTN (hypertension)   . Hyperlipemia   . Depression   . BMI 31.0-31.9,adult JUNE 2011 180 LBS   . GERD (gastroesophageal reflux disease)   . Prostate hypertrophy   . IBS (irritable bowel syndrome)   . Allergy   . Chronic back pain   . Gastric ulcer 08/31/2012   Past Surgical History  Procedure Laterality Date  . Hand surgery  at the age of 64    Brother accidentally cut it  . Ganglion cyst excision      from L wrist and pins placed for Fx involving L thumb  . Back surgery    . Tumor removal  1994    Benign from brain at [facility]  . Cyst removed      from Left leg and knee  . Upper gastrointestinal endoscopy  JAN 2011    GASTRIC ULCER-EOS GRANULOMA  . Upper gastrointestinal endoscopy  JUN 2011    GASTRIC ULCER- EOS, NO H. PYLORI  . Upper gastrointestinal endoscopy  NOV 2011    GASTRIC NODULE, no ulcer- EOS  . Colonoscopy  JAN 2011 COMPLETE    hypoTN and bradycardia w/ Demerol ,Phenergan and Versed, [place] TICS, SML IH  . Colonoscopy  2003    NUR-normal  . Colonoscopy  06/21/09    Yoel Kaufhold-sigmoid diverticulosis, sm int hemorrhoids  . Mass excision  04/29/2012    Procedure: EXCISION MASS;  Surgeon: Donato Heinz, MD;  Location: [facility];  Service: General;  Laterality: Right;  Excision of Vascular Mass Right Arm  . Esophagogastroduodenoscopy  05/20/2012    SLF: MILD ESOPHAGITIS.NO BARRETT'S/Multiple ulcers ranging between 3-5 mm/MILD  Duodenal inflammation was found in the duodenal bulb  . Esophagogastroduodenoscopy N/A 09/05/2012    SLF: MILD Non-erosive gastritis (inflammation) in the gastric antrum. Biopsies benign, no H. pylori.  . Colonoscopy N/A 10/24/2013    Normal mucosa in the terminal ileum Moderate diverticulosis noted in the sigmoid colonModerate sized internal hemorrhoids. negative random colon bx.. next TCS 10/2023  . Esophagogastroduodenoscopy N/A 10/24/2013    DDU:[id number] DISTAL ESOPHAGEAL WEBSmall hiatal herniaMILD Non-erosive gastritis. SB bx benign. gastric bx, minimal inflammation.   Venia Minks dilation N/A 10/24/2013    Procedure: Venia Minks DILATION;  Surgeon: Danie Binder, MD;  Location: [facility];  Service: Endoscopy;  Laterality: N/A;  . Savory dilation N/A 10/24/2013    Procedure: SAVORY DILATION;  Surgeon: Danie Binder, MD;  Location: [facility];  Service: Endoscopy;  Laterality: N/A;   Allergies  Allergen Reactions  . Sulfonamide Derivatives     REACTION: causes hives    Current Outpatient Prescriptions  Medication Sig Dispense Refill  . doxazosin (CARDURA) 8 MG tablet TAKE 1 TABLET BY MOUTH AT BEDTIME    . enalapril (VASOTEC) 20 MG tablet TAKE 1 TABLET BY MOUTH EVERY DAY    . loratadine (CLARITIN) 10 MG tablet Take 10 mg by mouth daily. PRN    .  pantoprazole (PROTONIX) 40 MG tablet TAKE 1 TABLET BY MOUTH TWICE A DAY    . PARoxetine (PAXIL) 20 MG tablet TAKE 1 TABLET BY MOUTH EVERY DAY    . pravastatin (PRAVACHOL) 20 MG tablet TAKE 1 TABLET BY MOUTH AT BEDTIME    . verapamil (CALAN-SR) 180 MG CR tablet TAKE 1 TABLET BY MOUTH TWICE A DAY    . alfuzosin (UROXATRAL) 10 MG 24 hr tablet Take 10 mg by mouth daily.    .        Review of Systems     Objective:   Physical Exam  Constitutional: He is oriented to person, place, and time. He appears well-developed and well-nourished. No distress.  HENT:  Head: Normocephalic and atraumatic.  Mouth/Throat: Oropharynx is clear and moist. No  oropharyngeal exudate.  Eyes: Pupils are equal, round, and reactive to light. No scleral icterus.  Neck: Normal range of motion. Neck supple.  Cardiovascular: Normal rate, regular rhythm and normal heart sounds.   Pulmonary/Chest: Effort normal and breath sounds normal. No respiratory distress.  Abdominal: Soft. Bowel sounds are normal. He exhibits no distension. There is no tenderness.  Musculoskeletal: He exhibits no edema.  Lymphadenopathy:    He has no cervical adenopathy.  Neurological: He is alert and oriented to person, place, and time.  Psychiatric: He has a normal mood and affect.  Vitals reviewed.         Assessment & Plan:

## 2014-11-28 NOTE — Assessment & Plan Note (Signed)
SYMPTOMS CONTROLLED/RESOLVED.  CONTINUE PROTONIX. TAKE 30 MINUTES PRIOR TO BREAKFAST. FOLLOW UP IN 1 YEAR.

## 2014-11-29 LAB — CBC WITH DIFFERENTIAL/PLATELET
Basophils Absolute: 0.1 10*3/uL (ref 0.0–0.1)
Basophils Relative: 1 % (ref 0–1)
EOS PCT: 3 % (ref 0–5)
Eosinophils Absolute: 0.3 10*3/uL (ref 0.0–0.7)
HEMATOCRIT: 44.1 % (ref 39.0–52.0)
Hemoglobin: 14.9 g/dL (ref 13.0–17.0)
Lymphocytes Relative: 20 % (ref 12–46)
Lymphs Abs: 1.7 10*3/uL (ref 0.7–4.0)
MCH: 28.6 pg (ref 26.0–34.0)
MCHC: 33.8 g/dL (ref 30.0–36.0)
MCV: 84.6 fL (ref 78.0–100.0)
MPV: 9.9 fL (ref 8.6–12.4)
Monocytes Absolute: 0.7 10*3/uL (ref 0.1–1.0)
Monocytes Relative: 8 % (ref 3–12)
Neutro Abs: 5.8 10*3/uL (ref 1.7–7.7)
Neutrophils Relative %: 68 % (ref 43–77)
Platelets: 307 10*3/uL (ref 150–400)
RBC: 5.21 MIL/uL (ref 4.22–5.81)
RDW: 15 % (ref 11.5–15.5)
WBC: 8.5 10*3/uL (ref 4.0–10.5)

## 2014-11-29 NOTE — Progress Notes (Signed)
PLEASE CALL PT. HIS BLOOD COUNT IS NORMAL.

## 2014-11-29 NOTE — Progress Notes (Signed)
Called. Many rings and no answer.  

## 2014-11-30 ENCOUNTER — Encounter (HOSPITAL_COMMUNITY): Payer: Self-pay | Admitting: Occupational Therapy

## 2014-11-30 ENCOUNTER — Ambulatory Visit (HOSPITAL_COMMUNITY): Payer: Medicare Other | Admitting: Occupational Therapy

## 2014-11-30 DIAGNOSIS — M25511 Pain in right shoulder: Secondary | ICD-10-CM | POA: Diagnosis not present

## 2014-11-30 DIAGNOSIS — R29898 Other symptoms and signs involving the musculoskeletal system: Secondary | ICD-10-CM

## 2014-11-30 DIAGNOSIS — IMO0002 Reserved for concepts with insufficient information to code with codable children: Secondary | ICD-10-CM

## 2014-11-30 DIAGNOSIS — M6289 Other specified disorders of muscle: Secondary | ICD-10-CM

## 2014-11-30 DIAGNOSIS — M629 Disorder of muscle, unspecified: Secondary | ICD-10-CM

## 2014-11-30 DIAGNOSIS — R531 Weakness: Secondary | ICD-10-CM | POA: Diagnosis not present

## 2014-11-30 DIAGNOSIS — M2560 Stiffness of unspecified joint, not elsewhere classified: Secondary | ICD-10-CM

## 2014-11-30 DIAGNOSIS — M25611 Stiffness of right shoulder, not elsewhere classified: Secondary | ICD-10-CM | POA: Diagnosis not present

## 2014-11-30 NOTE — Therapy (Signed)
[place], [place], [address] Phone: [phone]   Fax:  [phone]  Occupational Therapy Treatment  Patient Details  Name: Adam [name] MRN: [id number] Date of Birth: 10-17-1950 Referring Provider:  [name], MD  Encounter Date: 11/30/2014      OT End of Session - 11/30/14 1203    Visit Number 4   Number of Visits 12   Date for OT Re-Evaluation 01/21/15  mini reassess on 7/14   Authorization Type Medicare A and B   Authorization Time Period before 10th visit   Authorization - Visit Number 4   Authorization - Number of Visits 10   OT Start Time 0930   OT Stop Time 1012   OT Time Calculation (min) 42 min   Activity Tolerance Patient tolerated treatment well   Behavior During Therapy [facility] for tasks assessed/performed      Past Medical History  Diagnosis Date  . Eosinophilic granuloma JAN 6160 GASTRIC, followed by Dr. Eugenia Pancoast    EGD JAN 2011, JUN 2011, NOV 2011  . HTN (hypertension)   . Hyperlipemia   . Depression   . BMI 31.0-31.9,adult JUNE 2011 180 LBS   . GERD (gastroesophageal reflux disease)   . Prostate hypertrophy   . IBS (irritable bowel syndrome)   . Allergy   . Chronic back pain   . Gastric ulcer 08/31/2012    Past Surgical History  Procedure Laterality Date  . Hand surgery  at the age of 16    Brother accidentally cut it  . Ganglion cyst excision      from L wrist and pins placed for Fx involving L thumb  . Back surgery    . Tumor removal  1994    Benign from brain at [facility]  . Cyst removed      from Left leg and knee  . Upper gastrointestinal endoscopy  JAN 2011    GASTRIC ULCER-EOS GRANULOMA  . Upper gastrointestinal endoscopy  JUN 2011    GASTRIC ULCER- EOS, NO H. PYLORI  . Upper gastrointestinal endoscopy  NOV 2011    GASTRIC NODULE, no ulcer- EOS  . Colonoscopy  JAN 2011 COMPLETE    hypoTN and bradycardia w/ Demerol ,Phenergan and Versed, [place] TICS,  SML IH  . Colonoscopy  2003    NUR-normal  . Colonoscopy  06/21/09    Fields-sigmoid diverticulosis, sm int hemorrhoids  . Mass excision  04/29/2012    Procedure: EXCISION MASS;  Surgeon: Donato Heinz, MD;  Location: [facility];  Service: General;  Laterality: Right;  Excision of Vascular Mass Right Arm  . Esophagogastroduodenoscopy  05/20/2012    SLF: MILD ESOPHAGITIS.NO BARRETT'S/Multiple ulcers ranging between 3-5 mm/MILD Duodenal inflammation was found in the duodenal bulb  . Esophagogastroduodenoscopy N/A 09/05/2012    SLF: MILD Non-erosive gastritis (inflammation) in the gastric antrum. Biopsies benign, no H. pylori.  . Colonoscopy N/A 10/24/2013    Normal mucosa in the terminal ileum Moderate diverticulosis noted in the sigmoid colonModerate sized internal hemorrhoids. negative random colon bx.. next TCS 10/2023  . Esophagogastroduodenoscopy N/A 10/24/2013    VPX:[id number] DISTAL ESOPHAGEAL WEBSmall hiatal herniaMILD Non-erosive gastritis. SB bx benign. gastric bx, minimal inflammation.   Venia Minks dilation N/A 10/24/2013    Procedure: Venia Minks DILATION;  Surgeon: Danie Binder, MD;  Location: [facility];  Service: Endoscopy;  Laterality: N/A;  . Savory dilation N/A 10/24/2013    Procedure: SAVORY DILATION;  Surgeon: Marga Melnick  Fields, MD;  Location: [facility];  Service: Endoscopy;  Laterality: N/A;    There were no vitals filed for this visit.  Visit Diagnosis:  Tight fascia  Stiffness of joint of upper extremity  Weakness of shoulder  Right shoulder pain      Subjective Assessment - 11/30/14 0926    Subjective  S: My shoulder felt good after last session, a little stiff.    Currently in Pain? No/denies            [facility] OT Assessment - 11/30/14 0924    Assessment   Diagnosis Right arm pain   Precautions   Precautions None          OT Treatments/Exercises (OP) - 11/30/14 0932    Exercises   Exercises Shoulder;Elbow   Shoulder Exercises: Supine   Protraction  PROM;5 reps;Strengthening;15 reps   Protraction Weight (lbs) 1   Horizontal ABduction PROM;Strengthening;15 reps   Horizontal ABduction Weight (lbs) 1   External Rotation PROM;Strengthening;15 reps   External Rotation Weight (lbs) 1   Internal Rotation PROM;10 reps;Strengthening;15 reps   Internal Rotation Weight (lbs) 1   Flexion PROM;10 reps;Strengthening;15 reps   Shoulder Flexion Weight (lbs) 1   ABduction PROM;10 reps;Strengthening;15 reps   Shoulder ABduction Weight (lbs) 1   Other Supine Exercises serratus anterior punch 15 times; 1#   Other Supine Exercises Arm circles; 5X forward 5X reverse; 1#   Shoulder Exercises: Prone   Retraction Strengthening;15 reps   Retraction Weight (lbs) 1   Flexion Strengthening;15 reps   Flexion Weight (lbs) 1   Extension Strengthening;15 reps   Extension Weight (lbs) 1   External Rotation Strengthening;15 reps   External Rotation Weight (lbs) 1   Internal Rotation Strengthening;15 reps   Internal Rotation Weight (lbs) 1   Other Prone Exercises Hugston exercises; 10X; 5 positions; 1#   Shoulder Exercises: Standing   Protraction Strengthening;15 reps   Protraction Weight (lbs) 1   Horizontal ABduction Strengthening;15 reps   Horizontal ABduction Weight (lbs) 1   External Rotation Strengthening;15 reps   External Rotation Weight (lbs) 1   Internal Rotation Strengthening;15 reps   Internal Rotation Weight (lbs) 1   Flexion Strengthening;15 reps   Shoulder Flexion Weight (lbs) 1   ABduction Strengthening;15 reps   Shoulder ABduction Weight (lbs) 1   Shoulder Exercises: ROM/Strengthening   X to V Arms 15X with 1#   Ball on Wall 1' flexion & abduction   Manual Therapy   Manual Therapy Myofascial release   Myofascial Release Myofascial release to right upper arm and associated areas to decrease pain and restrictions and improve pain free mobility.  MFR to scapular region and triceps region, with PROM abduction, patient had significant  restriction/knot in mid tricep region, with arm at 90 abd performed MFR to this region to decrease restrictions           OT Short Term Goals - 11/23/14 0954    OT SHORT TERM GOAL #1   Title Patient will be educated and independent with HEP.   Time 3   Period Weeks   Status On-going   OT SHORT TERM GOAL #2   Title Patient will increase AROM to WNL to increase ability to complete work tasks overhead with less difficulty.    Time 3   Period Weeks   Status On-going   OT SHORT TERM GOAL #3   Title Patient will decrease pain in RUE to 5/10 when completing lifting tasks.    Time 3  Period Weeks   Status On-going   OT SHORT TERM GOAL #4   Title Patient will increase strength to 4+/5 to increase ability to complete daily yardwork tasks.   Time 3   Period Weeks   Status On-going   OT SHORT TERM GOAL #5   Title Patient will decrease fascial restrictions to Min Amount.    Time 3   Period Weeks   Status On-going           OT Long Term Goals - 11/23/14 0955    OT LONG TERM GOAL #1   Title Patient will return to highest level of independence with all daily and work tasks.   Time 6   Period Weeks   Status On-going   OT LONG TERM GOAL #2   Title Patient will increase RUE strength to 5/5 to increase ability to lift heavy items at home.   Time 6   Period Weeks   Status On-going   OT LONG TERM GOAL #3   Title Patient will increase RUE shoulder stability by being able to complete dynamic yardwork tasks with less difficulty.    Time 6   Period Weeks   Status On-going   OT LONG TERM GOAL #4   Title Patient will decrease fascial restrictions to trace amount.    Time 6   Period Weeks   Status On-going             Plan - 11/30/14 1204    Clinical Impression Statement A: Added ball on wall in flexion & abduction. Pt began to experience muscle fatigue during prone exercises, was offered a break & declined. Unable to complete UBE because it was unavailable. Pt reports his  shoulder is feeling good.    Plan P: Continue strengthening exercises, add proximal shoulder strengthening in standing.         Problem List Patient Active Problem List   Diagnosis Date Noted  . Right arm pain 10/31/2014  . Overuse injury 10/31/2014  . Cervical disc disorder with radiculopathy of cervical region 10/31/2014  . IBS (irritable bowel syndrome) 10/19/2013  . Back pain 01/02/2013  . Hyperlipidemia LDL goal <130 09/20/2012  . Essential hypertension, benign 09/20/2012  . GERD (gastroesophageal reflux disease) 12/30/2011  . GRANULOMA 08/27/2009  . Dysphagia 06/17/2009  . PROSTATITIS, HX OF 06/17/2009    Guadelupe Sabin, OTR/L  [phone]  11/30/2014, 12:07 PM  [place] [address] [place], [place], [address] Phone: [phone]   Fax:  [phone]

## 2014-12-03 ENCOUNTER — Ambulatory Visit (HOSPITAL_COMMUNITY): Payer: Medicare Other | Admitting: Specialist

## 2014-12-03 DIAGNOSIS — R531 Weakness: Secondary | ICD-10-CM | POA: Diagnosis not present

## 2014-12-03 DIAGNOSIS — R29898 Other symptoms and signs involving the musculoskeletal system: Secondary | ICD-10-CM

## 2014-12-03 DIAGNOSIS — M6289 Other specified disorders of muscle: Secondary | ICD-10-CM | POA: Diagnosis not present

## 2014-12-03 DIAGNOSIS — M25511 Pain in right shoulder: Secondary | ICD-10-CM | POA: Diagnosis not present

## 2014-12-03 DIAGNOSIS — M25611 Stiffness of right shoulder, not elsewhere classified: Secondary | ICD-10-CM | POA: Diagnosis not present

## 2014-12-03 NOTE — Therapy (Signed)
[place], [place], [address] Phone: [phone]   Fax:  [phone]  Occupational Therapy Treatment  Patient Details  Name: Adam [name] MRN: [id number] Date of Birth: 05/10/1951 Referring Provider:  Melvenia Beam, MD  Encounter Date: 12/03/2014      OT End of Session - 12/03/14 1511    Visit Number 5   Number of Visits 12   Date for OT Re-Evaluation 01/21/15  mini reassess on 12/20/14   Authorization Type Medicare A and B   Authorization Time Period before 10th visit   Authorization - Visit Number 5   Authorization - Number of Visits 10   OT Start Time 5361   OT Stop Time 1515   OT Time Calculation (min) 38 min   Activity Tolerance Patient tolerated treatment well   Behavior During Therapy [facility] for tasks assessed/performed      Past Medical History  Diagnosis Date  . Eosinophilic granuloma JAN 4431 GASTRIC, followed by Dr. Eugenia Pancoast    EGD JAN 2011, JUN 2011, NOV 2011  . HTN (hypertension)   . Hyperlipemia   . Depression   . BMI 31.0-31.9,adult JUNE 2011 180 LBS   . GERD (gastroesophageal reflux disease)   . Prostate hypertrophy   . IBS (irritable bowel syndrome)   . Allergy   . Chronic back pain   . Gastric ulcer 08/31/2012    Past Surgical History  Procedure Laterality Date  . Hand surgery  at the age of 72    Brother accidentally cut it  . Ganglion cyst excision      from L wrist and pins placed for Fx involving L thumb  . Back surgery    . Tumor removal  1994    Benign [facility] at [facility]  . Cyst removed      from Left leg and knee  . Upper gastrointestinal endoscopy  JAN 2011    GASTRIC ULCER-EOS GRANULOMA  . Upper gastrointestinal endoscopy  JUN 2011    GASTRIC ULCER- EOS, NO H. PYLORI  . Upper gastrointestinal endoscopy  NOV 2011    GASTRIC NODULE, no ulcer- EOS  . Colonoscopy  JAN 2011 COMPLETE    hypoTN and bradycardia w/ Demerol ,Phenergan and Versed, [place] TICS,  SML IH  . Colonoscopy  2003    NUR-normal  . Colonoscopy  06/21/09    Fields-sigmoid diverticulosis, sm int hemorrhoids  . Mass excision  04/29/2012    Procedure: EXCISION MASS;  Surgeon: Donato Heinz, MD;  Location: [facility];  Service: General;  Laterality: Right;  Excision of Vascular Mass Right Arm  . Esophagogastroduodenoscopy  05/20/2012    SLF: MILD ESOPHAGITIS.NO BARRETT'S/Multiple ulcers ranging between 3-5 mm/MILD Duodenal inflammation was found in the duodenal bulb  . Esophagogastroduodenoscopy N/A 09/05/2012    SLF: MILD Non-erosive gastritis (inflammation) in the gastric antrum. Biopsies benign, no H. pylori.  . Colonoscopy N/A 10/24/2013    Normal mucosa in the terminal ileum Moderate diverticulosis noted in the sigmoid colonModerate sized internal hemorrhoids. negative random colon bx.. next TCS 10/2023  . Esophagogastroduodenoscopy N/A 10/24/2013    VQM:[id number] DISTAL ESOPHAGEAL WEBSmall hiatal herniaMILD Non-erosive gastritis. SB bx benign. gastric bx, minimal inflammation.   Venia Minks dilation N/A 10/24/2013    Procedure: Venia Minks DILATION;  Surgeon: Danie Binder, MD;  [facility];  Service: Endoscopy;  Laterality: N/A;  . Savory dilation N/A 10/24/2013    Procedure: SAVORY DILATION;  Surgeon: Marga Melnick  Fields, MD;  Location: [facility];  Service: Endoscopy;  Laterality: N/A;    There were no vitals filed for this visit.  Visit Diagnosis:  Weakness of shoulder      Subjective Assessment - 12/03/14 1432    Subjective  S:  I can tell its getting a whole lot better, I can use it longer.   Currently in Pain? No/denies            [facility] OT Assessment - 12/03/14 0001    Assessment   Diagnosis Right arm pain   Precautions   Precautions None                  OT Treatments/Exercises (OP) - 12/03/14 0001    Exercises   Exercises Shoulder;Elbow   Shoulder Exercises: Supine   Protraction PROM;5 reps;Strengthening;15 reps   Protraction Weight  (lbs) 2   Horizontal ABduction PROM;Strengthening;15 reps   Horizontal ABduction Weight (lbs) 2   External Rotation PROM;Strengthening;15 reps   External Rotation Weight (lbs) 2   Internal Rotation PROM;10 reps;Strengthening;15 reps   Internal Rotation Weight (lbs) 2   Flexion PROM;10 reps;Strengthening;15 reps   Shoulder Flexion Weight (lbs) 2   ABduction PROM;10 reps;Strengthening;15 reps   Shoulder ABduction Weight (lbs) 2   Other Supine Exercises serratus anterior punch 15 times; 2#   Shoulder Exercises: Prone   Retraction Strengthening;15 reps   Retraction Weight (lbs) 2   Flexion Strengthening;15 reps   Flexion Weight (lbs) 2   Extension Strengthening;15 reps   External Rotation 20 reps   Internal Rotation Strengthening;15 reps   Internal Rotation Weight (lbs) 2   Other Prone Exercises [place] exercises; 10X; 5 positions; 2#   Shoulder Exercises: Standing   Protraction Strengthening;15 reps   Protraction Weight (lbs) 2   Horizontal ABduction Strengthening;15 reps   Horizontal ABduction Weight (lbs) 2   External Rotation Strengthening;15 reps   External Rotation Weight (lbs) 2   Internal Rotation Strengthening;15 reps   Internal Rotation Weight (lbs) 2   Flexion Strengthening;15 reps   Shoulder Flexion Weight (lbs) 2   ABduction Strengthening;15 reps   Shoulder ABduction Weight (lbs) 2   Shoulder Exercises: ROM/Strengthening   UBE (Upper Arm Bike) 3 minutes in reverse at 2.0   Cybex Press 1.5 plate;15 reps   Cybex Row 1.5 plate;15 reps   Proximal Shoulder Strengthening, Seated 10 times each with 2#   Ball on Wall 1' flexed to 90 and 1' abducted to 90   Manual Therapy   Manual Therapy Myofascial release   Myofascial Release Myofascial release to right upper arm and associated areas to decrease pain and restrictions and improve pain free mobility.  MFR to scapular region and triceps region, with PROM abduction, patient had significant restriction/knot in mid tricep  region, with arm at 90 abd performed MFR to this region to decrease restrictions                  OT Short Term Goals - 11/23/14 0954    OT SHORT TERM GOAL #1   Title Patient will be educated and independent with HEP.   Time 3   Period Weeks   Status On-going   OT SHORT TERM GOAL #2   Title Patient will increase AROM to WNL to increase ability to complete work tasks overhead with less difficulty.    Time 3   Period Weeks   Status On-going   OT SHORT TERM GOAL #3   Title Patient will decrease pain  in RUE to 5/10 when completing lifting tasks.    Time 3   Period Weeks   Status On-going   OT SHORT TERM GOAL #4   Title Patient will increase strength to 4+/5 to increase ability to complete daily yardwork tasks.   Time 3   Period Weeks   Status On-going   OT SHORT TERM GOAL #5   Title Patient will decrease fascial restrictions to Min Amount.    Time 3   Period Weeks   Status On-going           OT Long Term Goals - 11/23/14 0955    OT LONG TERM GOAL #1   Title Patient will return to highest level of independence with all daily and work tasks.   Time 6   Period Weeks   Status On-going   OT LONG TERM GOAL #2   Title Patient will increase RUE strength to 5/5 to increase ability to lift heavy items at home.   Time 6   Period Weeks   Status On-going   OT LONG TERM GOAL #3   Title Patient will increase RUE shoulder stability by being able to complete dynamic yardwork tasks with less difficulty.    Time 6   Period Weeks   Status On-going   OT LONG TERM GOAL #4   Title Patient will decrease fascial restrictions to trace amount.    Time 6   Period Weeks   Status On-going               Plan - 12/03/14 1514    Clinical Impression Statement : Increased to 2# resistance with all strengthening exercises this date. Paitent tolerated increase well, fatigued near end of repetitions in seated and prone.    Pt will benefit from skilled therapeutic intervention in  order to improve on the following deficits (Retired) Decreased range of motion;Pain;Decreased strength;Increased fascial restricitons;Impaired UE functional use   Plan P: Add functional lifting from floor to waist and waist to overhead as this difficulty is still difficult for patient. Assess mechanics during task and add weight gradually to lift box.  Manual therapy PRN only        Problem List Patient Active Problem List   Diagnosis Date Noted  . Right arm pain 10/31/2014  . Overuse injury 10/31/2014  . Cervical disc disorder with radiculopathy of cervical region 10/31/2014  . IBS (irritable bowel syndrome) 10/19/2013  . Back pain 01/02/2013  . Hyperlipidemia LDL goal <130 09/20/2012  . Essential hypertension, benign 09/20/2012  . GERD (gastroesophageal reflux disease) 12/30/2011  . GRANULOMA 08/27/2009  . Dysphagia 06/17/2009  . Hope Budds OF 06/17/2009    Vangie Bicker, OTR/L [phone]  12/03/2014, 3:15 PM  [place] [address] [place], [place], [address] Phone: [phone]   Fax:  [phone]

## 2014-12-06 ENCOUNTER — Encounter (HOSPITAL_COMMUNITY): Payer: Medicare Other

## 2014-12-11 ENCOUNTER — Ambulatory Visit (HOSPITAL_COMMUNITY): Payer: Medicare Other | Attending: Neurology

## 2014-12-11 ENCOUNTER — Encounter (HOSPITAL_COMMUNITY): Payer: Self-pay

## 2014-12-11 DIAGNOSIS — IMO0002 Reserved for concepts with insufficient information to code with codable children: Secondary | ICD-10-CM

## 2014-12-11 DIAGNOSIS — M2560 Stiffness of unspecified joint, not elsewhere classified: Secondary | ICD-10-CM | POA: Insufficient documentation

## 2014-12-11 DIAGNOSIS — R29898 Other symptoms and signs involving the musculoskeletal system: Secondary | ICD-10-CM | POA: Diagnosis not present

## 2014-12-11 NOTE — Therapy (Signed)
[place], [place], [address] Phone: [phone]   Fax:  [phone]  Occupational Therapy Treatment  Patient Details  Name: Adam [name] MRN: [id number] Date of Birth: Oct 22, 1950 Referring Provider:  [name], MD  Encounter Date: 12/11/2014      OT End of Session - 12/11/14 1350    Visit Number 6   Number of Visits 12   Date for OT Re-Evaluation 01/21/15  mini reassess on 12/20/14   Authorization Type Medicare A and B   Authorization Time Period before 10th visit   Authorization - Visit Number 6   Authorization - Number of Visits 10   OT Start Time 5009   OT Stop Time 1350   OT Time Calculation (min) 45 min   Activity Tolerance Patient tolerated treatment well   Behavior During Therapy [facility] for tasks assessed/performed      Past Medical History  Diagnosis Date  . Eosinophilic granuloma JAN 3818 GASTRIC, followed by Dr. Eugenia Pancoast    EGD JAN 2011, JUN 2011, NOV 2011  . HTN (hypertension)   . Hyperlipemia   . Depression   . BMI 31.0-31.9,adult JUNE 2011 180 LBS   . GERD (gastroesophageal reflux disease)   . Prostate hypertrophy   . IBS (irritable bowel syndrome)   . Allergy   . Chronic back pain   . Gastric ulcer 08/31/2012    Past Surgical History  Procedure Laterality Date  . Hand surgery  at the age of 17    Brother accidentally cut it  . Ganglion cyst excision      from L wrist and pins placed for Fx involving L thumb  . Back surgery    . Tumor removal  1994    Benign from brain at [facility]  . Cyst removed      from Left leg and knee  . Upper gastrointestinal endoscopy  JAN 2011    GASTRIC ULCER-EOS GRANULOMA  . Upper gastrointestinal endoscopy  JUN 2011    GASTRIC ULCER- EOS, NO H. PYLORI  . Upper gastrointestinal endoscopy  NOV 2011    GASTRIC NODULE, no ulcer- EOS  . Colonoscopy  JAN 2011 COMPLETE    hypoTN and bradycardia w/ Demerol ,Phenergan and Versed, [place] TICS,  SML IH  . Colonoscopy  2003    NUR-normal  . Colonoscopy  06/21/09    Fields-sigmoid diverticulosis, sm int hemorrhoids  . Mass excision  04/29/2012    Procedure: EXCISION MASS;  Surgeon: Donato Heinz, MD;  Location: [facility];  Service: General;  Laterality: Right;  Excision of Vascular Mass Right Arm  . Esophagogastroduodenoscopy  05/20/2012    SLF: MILD ESOPHAGITIS.NO BARRETT'S/Multiple ulcers ranging between 3-5 mm/MILD Duodenal inflammation was found in the duodenal bulb  . Esophagogastroduodenoscopy N/A 09/05/2012    SLF: MILD Non-erosive gastritis (inflammation) in the gastric antrum. Biopsies benign, no H. pylori.  . Colonoscopy N/A 10/24/2013    Normal mucosa in the terminal ileum Moderate diverticulosis noted in the sigmoid colonModerate sized internal hemorrhoids. negative random colon bx.. next TCS 10/2023  . Esophagogastroduodenoscopy N/A 10/24/2013    EXH:[id number] DISTAL ESOPHAGEAL WEBSmall hiatal herniaMILD Non-erosive gastritis. SB bx benign. gastric bx, minimal inflammation.   Venia Minks dilation N/A 10/24/2013    Procedure: Venia Minks DILATION;  Surgeon: Danie Binder, MD;  Location: [facility];  Service: Endoscopy;  Laterality: N/A;  . Savory dilation N/A 10/24/2013    Procedure: SAVORY DILATION;  Surgeon: Marga Melnick  Fields, MD;  Location: [facility];  Service: Endoscopy;  Laterality: N/A;    There were no vitals filed for this visit.  Visit Diagnosis:  Weakness of shoulder  Stiffness of joint of upper extremity          [facility] OT Assessment - 12/11/14 1312    Assessment   Diagnosis Right arm pain   Precautions   Precautions None                  OT Treatments/Exercises (OP) - 12/11/14 1312    Exercises   Exercises Shoulder;Elbow   Shoulder Exercises: Supine   Protraction PROM;5 reps;Strengthening;15 reps   Protraction Weight (lbs) 2   Horizontal ABduction PROM;Strengthening;15 reps   Horizontal ABduction Weight (lbs) 2   External Rotation  PROM;Strengthening;15 reps   External Rotation Weight (lbs) 2   Internal Rotation PROM;10 reps;Strengthening;15 reps   Internal Rotation Weight (lbs) 2   Flexion PROM;10 reps;Strengthening;15 reps   Shoulder Flexion Weight (lbs) 2   ABduction PROM;10 reps;Strengthening;15 reps   Shoulder ABduction Weight (lbs) 2   Other Supine Exercises serratus anterior punch 15 times; 2#   Other Supine Exercises Arm circles; 5X forward 5X reverse; 2#   Shoulder Exercises: Prone   Other Prone Exercises Hugston exercises; 12X; 5 positions; 2#   Other Prone Exercises Face Down Field Goal; 2#; 15X   Shoulder Exercises: Standing   External Rotation Strengthening;15 reps   External Rotation Weight (lbs) 2   Internal Rotation Strengthening;15 reps   Internal Rotation Weight (lbs) 2   ABduction Strengthening;15 reps   Shoulder ABduction Weight (lbs) 2   Other Standing Exercises Standing Arm Raises; 2#; 15X   Shoulder Exercises: ROM/Strengthening   UBE (Upper Arm Bike) 3 minutes in reverse at 2.0   Proximal Shoulder Strengthening, Seated 10 times each with 2#. several rest breaks    Ball on Wall 1' flexed to 90 and 1' abducted to 90   Other ROM/Strengthening Exercises Prone: Scapular raise; 15X; 2#   Other ROM/Strengthening Exercises Functional lifting from ground to waist then to overhead. Began with empty box and added 5# each time ending with 15#. Min difficulty. Reviewed proper body mechanics when lifting.    Elbow Exercises   Elbow Extension PROM;5 reps   Forearm Supination PROM;5 reps   Forearm Pronation PROM;5 reps   Manual Therapy   Manual Therapy Passive ROM   Passive ROM Passive ROM to right shoulder and elbow to decrease muscle tightness and increase joint mobility.                   OT Short Term Goals - 11/23/14 0954    OT SHORT TERM GOAL #1   Title Patient will be educated and independent with HEP.   Time 3   Period Weeks   Status On-going   OT SHORT TERM GOAL #2   Title  Patient will increase AROM to WNL to increase ability to complete work tasks overhead with less difficulty.    Time 3   Period Weeks   Status On-going   OT SHORT TERM GOAL #3   Title Patient will decrease pain in RUE to 5/10 when completing lifting tasks.    Time 3   Period Weeks   Status On-going   OT SHORT TERM GOAL #4   Title Patient will increase strength to 4+/5 to increase ability to complete daily yardwork tasks.   Time 3   Period Weeks   Status On-going  OT SHORT TERM GOAL #5   Title Patient will decrease fascial restrictions to Min Amount.    Time 3   Period Weeks   Status On-going           OT Long Term Goals - 11/23/14 0955    OT LONG TERM GOAL #1   Title Patient will return to highest level of independence with all daily and work tasks.   Time 6   Period Weeks   Status On-going   OT LONG TERM GOAL #2   Title Patient will increase RUE strength to 5/5 to increase ability to lift heavy items at home.   Time 6   Period Weeks   Status On-going   OT LONG TERM GOAL #3   Title Patient will increase RUE shoulder stability by being able to complete dynamic yardwork tasks with less difficulty.    Time 6   Period Weeks   Status On-going   OT LONG TERM GOAL #4   Title Patient will decrease fascial restrictions to trace amount.    Time 6   Period Weeks   Status On-going               Plan - 12/11/14 1350    Clinical Impression Statement A: Added shoulder strengthening exercises prone and standing. Added functional lifting activity adding weight with each lift and providing education on proper body mechanics. Pt tolerated well. Pt reports that he completes the majority of lifting with a 5 gallon bucket versus a box.   Plan P: Use 5 gallon bucket to complete functional lifting activity.         Problem List Patient Active Problem List   Diagnosis Date Noted  . Right arm pain 10/31/2014  . Overuse injury 10/31/2014  . Cervical disc disorder with  radiculopathy of cervical region 10/31/2014  . IBS (irritable bowel syndrome) 10/19/2013  . Back pain 01/02/2013  . Hyperlipidemia LDL goal <130 09/20/2012  . Essential hypertension, benign 09/20/2012  . GERD (gastroesophageal reflux disease) 12/30/2011  . GRANULOMA 08/27/2009  . Dysphagia 06/17/2009  . PROSTATITIS, HX OF 06/17/2009    Ailene Ravel, OTR/L,CBIS  [phone]  12/11/2014, 1:53 PM  [facility] [address] [place], [place], [address] Phone: [phone]   Fax:  [phone]

## 2014-12-13 ENCOUNTER — Ambulatory Visit (HOSPITAL_COMMUNITY): Payer: Medicare Other

## 2014-12-13 ENCOUNTER — Encounter (HOSPITAL_COMMUNITY): Payer: Self-pay

## 2014-12-13 DIAGNOSIS — R29898 Other symptoms and signs involving the musculoskeletal system: Secondary | ICD-10-CM

## 2014-12-13 DIAGNOSIS — M2560 Stiffness of unspecified joint, not elsewhere classified: Secondary | ICD-10-CM

## 2014-12-13 DIAGNOSIS — IMO0002 Reserved for concepts with insufficient information to code with codable children: Secondary | ICD-10-CM

## 2014-12-13 NOTE — Therapy (Signed)
[place], [place], [address] Phone: [phone]   Fax:  [phone]  Occupational Therapy Treatment  Patient Details  Name: Adam [name] MRN: [id number] Date of Birth: 27-Nov-1950 Referring Provider:  Melvenia Beam, MD  Encounter Date: 12/13/2014      OT End of Session - 12/13/14 1238    Visit Number 7   Number of Visits 12   Date for OT Re-Evaluation 01/21/15  mini reassess on 12/20/14   Authorization Type Medicare A and B   Authorization Time Period before 10th visit   Authorization - Visit Number 7   Authorization - Number of Visits 10   OT Start Time 1020   OT Stop Time 1101   OT Time Calculation (min) 41 min   Activity Tolerance Patient tolerated treatment well   Behavior During Therapy [facility] for tasks assessed/performed      Past Medical History  Diagnosis Date  . Eosinophilic granuloma JAN 0623 GASTRIC, followed by Dr. Eugenia Pancoast    EGD JAN 2011, JUN 2011, NOV 2011  . HTN (hypertension)   . Hyperlipemia   . Depression   . BMI 31.0-31.9,adult JUNE 2011 180 LBS   . GERD (gastroesophageal reflux disease)   . Prostate hypertrophy   . IBS (irritable bowel syndrome)   . Allergy   . Chronic back pain   . Gastric ulcer 08/31/2012    Past Surgical History  Procedure Laterality Date  . Hand surgery  at the age of 26    Brother accidentally cut it  . Ganglion cyst excision      from L wrist and pins placed for Fx involving L thumb  . Back surgery    . Tumor removal  1994    Benign from brain at [facility]  . Cyst removed      from Left leg and knee  . Upper gastrointestinal endoscopy  JAN 2011    GASTRIC ULCER-EOS GRANULOMA  . Upper gastrointestinal endoscopy  JUN 2011    GASTRIC ULCER- EOS, NO H. PYLORI  . Upper gastrointestinal endoscopy  NOV 2011    GASTRIC NODULE, no ulcer- EOS  . Colonoscopy  JAN 2011 COMPLETE    hypoTN and bradycardia w/ Demerol ,Phenergan and Versed, [place] TICS,  SML IH  . Colonoscopy  2003    NUR-normal  . Colonoscopy  06/21/09    Fields-sigmoid diverticulosis, sm int hemorrhoids  . Mass excision  04/29/2012    Procedure: EXCISION MASS;  Surgeon: Donato Heinz, MD;  Location: [facility];  Service: General;  Laterality: Right;  Excision of Vascular Mass Right Arm  . Esophagogastroduodenoscopy  05/20/2012    SLF: MILD ESOPHAGITIS.NO BARRETT'S/Multiple ulcers ranging between 3-5 mm/MILD Duodenal inflammation was found in the duodenal bulb  . Esophagogastroduodenoscopy N/A 09/05/2012    SLF: MILD Non-erosive gastritis (inflammation) in the gastric antrum. Biopsies benign, no H. pylori.  . Colonoscopy N/A 10/24/2013    Normal mucosa in the terminal ileum Moderate diverticulosis noted in the sigmoid colonModerate sized internal hemorrhoids. negative random colon bx.. next TCS 10/2023  . Esophagogastroduodenoscopy N/A 10/24/2013    JSE:[id number] DISTAL ESOPHAGEAL WEBSmall hiatal herniaMILD Non-erosive gastritis. SB bx benign. gastric bx, minimal inflammation.   Venia Minks dilation N/A 10/24/2013    Procedure: Venia Minks DILATION;  Surgeon: Danie Binder, MD;  Location[facility];  Service: Endoscopy;  Laterality: N/A;  . Savory dilation N/A 10/24/2013    Procedure: SAVORY DILATION;  Surgeon: Marga Melnick  Fields, MD;  Location: [facility];  Service: Endoscopy;  Laterality: N/A;    There were no vitals filed for this visit.  Visit Diagnosis:  Weakness of shoulder  Stiffness of joint of upper extremity      Subjective Assessment - 12/13/14 1031    Subjective  S: I should have brought a 5 gallon bucket for [place] to use.    Currently in Pain? No/denies            [facility] OT Assessment - 12/13/14 1032    Assessment   Diagnosis Right arm pain   Precautions   Precautions None                  OT Treatments/Exercises (OP) - 12/13/14 1032    Exercises   Exercises Shoulder;Elbow   Shoulder Exercises: Supine   Protraction PROM;5  reps;Strengthening;15 reps   Protraction Weight (lbs) 2   Horizontal ABduction PROM;Strengthening;15 reps   Horizontal ABduction Weight (lbs) 2   External Rotation PROM;Strengthening;15 reps   External Rotation Weight (lbs) 2   Internal Rotation PROM;10 reps;Strengthening;15 reps   Internal Rotation Weight (lbs) 2   Flexion PROM;10 reps;Strengthening;15 reps   Shoulder Flexion Weight (lbs) 2   ABduction PROM;10 reps;Strengthening;15 reps   Shoulder ABduction Weight (lbs) 2   Other Supine Exercises serratus anterior punch 15 times; 2#   Other Supine Exercises Arm circles; 5X forward 5X reverse; 2#   Shoulder Exercises: Prone   Other Prone Exercises Hugston exercises; 12X; 5 positions; 2#   Other Prone Exercises Face Down Field Goal; 2#; 15X   Shoulder Exercises: ROM/Strengthening   UBE (Upper Arm Bike) 3 minutes in reverse at 2.0   Cybex Press 2 plate;15 reps   Cybex Row 2 plate;15 reps   Proximal Shoulder Strengthening, Supine 15X with 2#   Ball on Wall 1' flexed to 90 and 1' abducted to 90   Other ROM/Strengthening Exercises Prone: Scapular raise; 15X; 2#   Manual Therapy   Manual Therapy Passive ROM   Passive ROM Passive ROM to right shoulder to decrease muscle tightness and increase joint mobility.                   OT Short Term Goals - 11/23/14 0954    OT SHORT TERM GOAL #1   Title Patient will be educated and independent with HEP.   Time 3   Period Weeks   Status On-going   OT SHORT TERM GOAL #2   Title Patient will increase AROM to WNL to increase ability to complete work tasks overhead with less difficulty.    Time 3   Period Weeks   Status On-going   OT SHORT TERM GOAL #3   Title Patient will decrease pain in RUE to 5/10 when completing lifting tasks.    Time 3   Period Weeks   Status On-going   OT SHORT TERM GOAL #4   Title Patient will increase strength to 4+/5 to increase ability to complete daily yardwork tasks.   Time 3   Period Weeks   Status  On-going   OT SHORT TERM GOAL #5   Title Patient will decrease fascial restrictions to Min Amount.    Time 3   Period Weeks   Status On-going           OT Long Term Goals - 11/23/14 0955    OT LONG TERM GOAL #1   Title Patient will return to highest level of independence with all  daily and work tasks.   Time 6   Period Weeks   Status On-going   OT LONG TERM GOAL #2   Title Patient will increase RUE strength to 5/5 to increase ability to lift heavy items at home.   Time 6   Period Weeks   Status On-going   OT LONG TERM GOAL #3   Title Patient will increase RUE shoulder stability by being able to complete dynamic yardwork tasks with less difficulty.    Time 6   Period Weeks   Status On-going   OT LONG TERM GOAL #4   Title Patient will decrease fascial restrictions to trace amount.    Time 6   Period Weeks   Status On-going               Plan - 12/13/14 1241    Clinical Impression Statement A:  Did not attempt functional lifting activity with 5 gallon bucket  this date as it was unavailable. Pt reports that all exercises are feeling great and he has not had pain in a while.    Plan P: Use 5 gallon bucket to complete functional lifting activity.         Problem List Patient Active Problem List   Diagnosis Date Noted  . Right arm pain 10/31/2014  . Overuse injury 10/31/2014  . Cervical disc disorder with radiculopathy of cervical region 10/31/2014  . IBS (irritable bowel syndrome) 10/19/2013  . Back pain 01/02/2013  . Hyperlipidemia LDL goal <130 09/20/2012  . Essential hypertension, benign 09/20/2012  . GERD (gastroesophageal reflux disease) 12/30/2011  . GRANULOMA 08/27/2009  . Dysphagia 06/17/2009  . Hope Budds OF 06/17/2009    Ailene Ravel, OTR/L,CBIS  [phone]  12/13/2014, 12:46 PM  [facility] [address] [place], [place], [address] Phone: [phone]   Fax:  [phone]

## 2014-12-18 ENCOUNTER — Encounter (HOSPITAL_COMMUNITY): Payer: Self-pay

## 2014-12-18 ENCOUNTER — Ambulatory Visit (HOSPITAL_COMMUNITY): Payer: Medicare Other

## 2014-12-18 DIAGNOSIS — M2560 Stiffness of unspecified joint, not elsewhere classified: Secondary | ICD-10-CM | POA: Diagnosis not present

## 2014-12-18 DIAGNOSIS — R29898 Other symptoms and signs involving the musculoskeletal system: Secondary | ICD-10-CM | POA: Diagnosis not present

## 2014-12-18 DIAGNOSIS — IMO0002 Reserved for concepts with insufficient information to code with codable children: Secondary | ICD-10-CM

## 2014-12-18 NOTE — Therapy (Signed)
[place], [place], [address] Phone: [phone]   Fax:  [phone]  Occupational Therapy Treatment  Patient Details  Name: Adam [name] MRN: [id number] Date of Birth: Jan 30, 1951 Referring Provider:  Melvenia Beam, MD  Encounter Date: 12/18/2014      OT End of Session - 12/18/14 1133    Visit Number 8   Number of Visits 12   Date for OT Re-Evaluation 01/21/15  mini reassess on 12/20/14   Authorization Type Medicare A and B   Authorization Time Period before 10th visit   Authorization - Visit Number 8   Authorization - Number of Visits 10   OT Start Time 1015   OT Stop Time 1100   OT Time Calculation (min) 45 min   Activity Tolerance Patient tolerated treatment well   Behavior During Therapy [facility] for tasks assessed/performed      Past Medical History  Diagnosis Date  . Eosinophilic granuloma JAN 4917 GASTRIC, followed by Dr. Eugenia Pancoast    EGD JAN 2011, JUN 2011, NOV 2011  . HTN (hypertension)   . Hyperlipemia   . Depression   . BMI 31.0-31.9,adult JUNE 2011 180 LBS   . GERD (gastroesophageal reflux disease)   . Prostate hypertrophy   . IBS (irritable bowel syndrome)   . Allergy   . Chronic back pain   . Gastric ulcer 08/31/2012    Past Surgical History  Procedure Laterality Date  . Hand surgery  at the age of 32    Brother accidentally cut it  . Ganglion cyst excision      from L wrist and pins placed for Fx involving L thumb  . Back surgery    . Tumor removal  1994    Benign from brain at [facility]  . Cyst removed      from Left leg and knee  . Upper gastrointestinal endoscopy  JAN 2011    GASTRIC ULCER-EOS GRANULOMA  . Upper gastrointestinal endoscopy  JUN 2011    GASTRIC ULCER- EOS, NO H. PYLORI  . Upper gastrointestinal endoscopy  NOV 2011    GASTRIC NODULE, no ulcer- EOS  . Colonoscopy  JAN 2011 COMPLETE    hypoTN and bradycardia w/ Demerol ,Phenergan and Versed, [place] TICS,  SML IH  . Colonoscopy  2003    NUR-normal  . Colonoscopy  06/21/09    Fields-sigmoid diverticulosis, sm int hemorrhoids  . Mass excision  04/29/2012    Procedure: EXCISION MASS;  Surgeon: Donato Heinz, MD;  Location: [facility];  Service: General;  Laterality: Right;  Excision of Vascular Mass Right Arm  . Esophagogastroduodenoscopy  05/20/2012    SLF: MILD ESOPHAGITIS.NO BARRETT'S/Multiple ulcers ranging between 3-5 mm/MILD Duodenal inflammation was found in the duodenal bulb  . Esophagogastroduodenoscopy N/A 09/05/2012    SLF: MILD Non-erosive gastritis (inflammation) in the gastric antrum. Biopsies benign, no H. pylori.  . Colonoscopy N/A 10/24/2013    Normal mucosa in the terminal ileum Moderate diverticulosis noted in the sigmoid colonModerate sized internal hemorrhoids. negative random colon bx.. next TCS 10/2023  . Esophagogastroduodenoscopy N/A 10/24/2013    HXT:[id number] DISTAL ESOPHAGEAL WEBSmall hiatal herniaMILD Non-erosive gastritis. SB bx benign. gastric bx, minimal inflammation.   Venia Minks dilation N/A 10/24/2013    Procedure: Venia Minks DILATION;  Surgeon: Danie Binder, MD;  [facility];  Service: Endoscopy;  Laterality: N/A;  . Savory dilation N/A 10/24/2013    Procedure: SAVORY DILATION;  Surgeon: Marga Melnick  Fields, MD;  Location: [facility];  Service: Endoscopy;  Laterality: N/A;    There were no vitals filed for this visit.  Visit Diagnosis:  Stiffness of joint of upper extremity  Weakness of shoulder      Subjective Assessment - 12/18/14 1025    Subjective  S: I feel like my arm is really getting better.   Currently in Pain? No/denies            [facility] OT Assessment - 12/18/14 1026    Assessment   Diagnosis Right arm pain   Precautions   Precautions None                  OT Treatments/Exercises (OP) - 12/18/14 1026    Exercises   Exercises Shoulder;Elbow   Shoulder Exercises: Supine   Protraction PROM;5 reps;Strengthening;15 reps    Protraction Weight (lbs) 2   Horizontal ABduction PROM;5 reps;Strengthening;15 reps   Horizontal ABduction Weight (lbs) 2   External Rotation PROM;5 reps;Strengthening;15 reps   External Rotation Weight (lbs) 2   Internal Rotation PROM;5 reps;Strengthening;15 reps   Internal Rotation Weight (lbs) 2   Flexion PROM;5 reps;Strengthening;15 reps   Shoulder Flexion Weight (lbs) 2   ABduction PROM;5 reps;Strengthening;15 reps   Shoulder ABduction Weight (lbs) 2   Other Supine Exercises serratus anterior punch 15 times; 2#   Other Supine Exercises Arm circles; 5X forward 5X reverse; 2#   Shoulder Exercises: Prone   Other Prone Exercises Hugston exercises; 12X; 5 positions; 2#   Other Prone Exercises Face Down Field Goal; 2#; 15X   Shoulder Exercises: Standing   Other Standing Exercises Standing Arm Raises; 2#; 15X   Shoulder Exercises: ROM/Strengthening   UBE (Upper Arm Bike) 3 minutes in reverse at 3.0   Cybex Press 2 plate;15 reps   Cybex Row 2 plate;15 reps   Proximal Shoulder Strengthening, Supine 15X with 2#   Ball on Wall 1' flexed to 90 and 1' abducted to 90   Other ROM/Strengthening Exercises Prone: Scapular raise; 15X; 2#   Other ROM/Strengthening Exercises Functional lifting from ground to waist then to shoulder level. Began with empty 5 gallon bucket and added 5# each time ending with 35#. Min difficulty. Reviewed proper body mechanics when lifting.    Manual Therapy   Manual Therapy Passive ROM   Passive ROM Passive ROM to right shoulder to decrease muscle tightness and increase joint mobility.                   OT Short Term Goals - 11/23/14 0954    OT SHORT TERM GOAL #1   Title Patient will be educated and independent with HEP.   Time 3   Period Weeks   Status On-going   OT SHORT TERM GOAL #2   Title Patient will increase AROM to WNL to increase ability to complete work tasks overhead with less difficulty.    Time 3   Period Weeks   Status On-going   OT  SHORT TERM GOAL #3   Title Patient will decrease pain in RUE to 5/10 when completing lifting tasks.    Time 3   Period Weeks   Status On-going   OT SHORT TERM GOAL #4   Title Patient will increase strength to 4+/5 to increase ability to complete daily yardwork tasks.   Time 3   Period Weeks   Status On-going   OT SHORT TERM GOAL #5   Title Patient will decrease fascial restrictions to Min Amount.  Time 3   Period Weeks   Status On-going           OT Long Term Goals - 11/23/14 0955    OT LONG TERM GOAL #1   Title Patient will return to highest level of independence with all daily and work tasks.   Time 6   Period Weeks   Status On-going   OT LONG TERM GOAL #2   Title Patient will increase RUE strength to 5/5 to increase ability to lift heavy items at home.   Time 6   Period Weeks   Status On-going   OT LONG TERM GOAL #3   Title Patient will increase RUE shoulder stability by being able to complete dynamic yardwork tasks with less difficulty.    Time 6   Period Weeks   Status On-going   OT LONG TERM GOAL #4   Title Patient will decrease fascial restrictions to trace amount.    Time 6   Period Weeks   Status On-going               Plan - 12/18/14 1133    Clinical Impression Statement A: Added functional lifting activity with 5 gallon bucket. Educated patient on proper lifting technques and using larger stronger muscle to complete task versus twisting.   Plan P: Reassess for possible discharge.        Problem List Patient Active Problem List   Diagnosis Date Noted  . Right arm pain 10/31/2014  . Overuse injury 10/31/2014  . Cervical disc disorder with radiculopathy of cervical region 10/31/2014  . IBS (irritable bowel syndrome) 10/19/2013  . Back pain 01/02/2013  . Hyperlipidemia LDL goal <130 09/20/2012  . Essential hypertension, benign 09/20/2012  . GERD (gastroesophageal reflux disease) 12/30/2011  . GRANULOMA 08/27/2009  . Dysphagia 06/17/2009   . PROSTATITIS, HX OF 06/17/2009    Ailene Ravel, OTR/L,CBIS  [phone]  12/18/2014, 12:21 PM  [facility] [address] [place], [place], [address] Phone: [phone]   Fax:  [phone]

## 2014-12-20 ENCOUNTER — Encounter (HOSPITAL_COMMUNITY): Payer: Self-pay

## 2014-12-20 ENCOUNTER — Ambulatory Visit (HOSPITAL_COMMUNITY): Payer: Medicare Other

## 2014-12-20 DIAGNOSIS — R29898 Other symptoms and signs involving the musculoskeletal system: Secondary | ICD-10-CM

## 2014-12-20 DIAGNOSIS — M2560 Stiffness of unspecified joint, not elsewhere classified: Secondary | ICD-10-CM | POA: Diagnosis not present

## 2014-12-20 NOTE — Therapy (Signed)
[place], [place], [address] Phone: [phone]   Fax:  [phone]  Occupational Therapy Treatment & reassessment  Patient Details  Name: Adam [name] MRN: [id number] Date of Birth: Oct 06, 1950 Referring Provider:  Melvenia Beam, MD  Encounter Date: 12/20/2014      OT End of Session - 12/20/14 1033    Visit Number 9   Number of Visits 12   Authorization Type Medicare A and B   Authorization Time Period before 10th visit   Authorization - Visit Number 9   Authorization - Number of Visits 10   OT Start Time 8811   OT Stop Time 1055   OT Time Calculation (min) 40 min   Activity Tolerance Patient tolerated treatment well   Behavior During Therapy [name] for tasks assessed/performed      Past Medical History  Diagnosis Date  . Eosinophilic granuloma JAN 0315 GASTRIC, followed by Dr. Eugenia Pancoast    EGD JAN 2011, JUN 2011, NOV 2011  . HTN (hypertension)   . Hyperlipemia   . Depression   . BMI 31.0-31.9,adult JUNE 2011 180 LBS   . GERD (gastroesophageal reflux disease)   . Prostate hypertrophy   . IBS (irritable bowel syndrome)   . Allergy   . Chronic back pain   . Gastric ulcer 08/31/2012    Past Surgical History  Procedure Laterality Date  . Hand surgery  at the age of 26    Brother accidentally cut it  . Ganglion cyst excision      from L wrist and pins placed for Fx involving L thumb  . Back surgery    . Tumor removal  1994    Benign from brain at [facility]  . Cyst removed      from Left leg and knee  . Upper gastrointestinal endoscopy  JAN 2011    GASTRIC ULCER-EOS GRANULOMA  . Upper gastrointestinal endoscopy  JUN 2011    GASTRIC ULCER- EOS, NO H. PYLORI  . Upper gastrointestinal endoscopy  NOV 2011    GASTRIC NODULE, no ulcer- EOS  . Colonoscopy  JAN 2011 COMPLETE    hypoTN and bradycardia w/ Demerol ,Phenergan and Versed, [place] TICS, SML IH  . Colonoscopy  2003    NUR-normal  .  Colonoscopy  06/21/09    Fields-sigmoid diverticulosis, sm int hemorrhoids  . Mass excision  04/29/2012    Procedure: EXCISION MASS;  Surgeon: Donato Heinz, MD;  Location: [facility] ORS;  Service: General;  Laterality: Right;  Excision of Vascular Mass Right Arm  . Esophagogastroduodenoscopy  05/20/2012    SLF: MILD ESOPHAGITIS.NO BARRETT'S/Multiple ulcers ranging between 3-5 mm/MILD Duodenal inflammation was found in the duodenal bulb  . Esophagogastroduodenoscopy N/A 09/05/2012    SLF: MILD Non-erosive gastritis (inflammation) in the gastric antrum. Biopsies benign, no H. pylori.  . Colonoscopy N/A 10/24/2013    Normal mucosa in the terminal ileum Moderate diverticulosis noted in the sigmoid colonModerate sized internal hemorrhoids. negative random colon bx.. next TCS 10/2023  . Esophagogastroduodenoscopy N/A 10/24/2013    [id number] DISTAL ESOPHAGEAL WEBSmall hiatal herniaMILD Non-erosive gastritis. SB bx benign. gastric bx, minimal inflammation.   Venia Minks dilation N/A 10/24/2013    Procedure: Venia Minks DILATION;  Surgeon: Danie Binder, MD;  Location: [facility];  Service: Endoscopy;  Laterality: N/A;  . Savory dilation N/A 10/24/2013    Procedure: SAVORY DILATION;  Surgeon: Danie Binder, MD;  Location: [facility] END[facility];  Service: Endoscopy;  Laterality: N/A;    There were no vitals filed for this visit.  Visit Diagnosis:  Weakness of shoulder      Subjective Assessment - 12/20/14 1022    Subjective  S: I have tried to pick up the bucket in the other way that you showed me.    Special Tests FOTO score: 98/100   Currently in Pain? No/denies            [name] OT Assessment - 12/20/14 1018    Assessment   Diagnosis Right arm pain   Precautions   Precautions None   AROM   Overall AROM Comments Assessed seated. IR/ER adducted   AROM Assessment Site Shoulder   Right/Left Shoulder Right   Right Shoulder Flexion 170 Degrees  on eval: 142   Right Shoulder ABduction 175 Degrees  on  eval: 135   Right Shoulder Internal Rotation 90 Degrees  same at eval   Right Shoulder External Rotation 90 Degrees  on eval: 90   Strength   Overall Strength Comments Assessed seated. IR/ER adducted   Strength Assessment Site Shoulder   Right/Left Shoulder Right   Right Shoulder Flexion 5/5  on eval: 4-/5   Right Shoulder ABduction 5/5  on eval: 4-/5   Right Shoulder Internal Rotation 5/5  on eval: 4-/5   Right Shoulder External Rotation 5/5  on eval: 4/5                  OT Treatments/Exercises (OP) - 12/20/14 1032    Shoulder Exercises: Seated   Horizontal ABduction Theraband;10 reps   Theraband Level (Shoulder Horizontal ABduction) Level 3 (Green)   External Rotation Theraband;10 reps   Theraband Level (Shoulder External Rotation) Level 3 (Green)   Internal Rotation Theraband;10 reps   Theraband Level (Shoulder Internal Rotation) Level 3 (Green)   Abduction Theraband;10 reps   Theraband Level (Shoulder ABduction) Level 3 Nyoka Cowden)                OT Education - 12/20/14 1030    Education provided Yes   Education Details Green theraband exercises   Person(s) Educated Patient   Methods Explanation;Demonstration;Handout   Comprehension Verbalized understanding;Returned demonstration          OT Short Term Goals - 12/20/14 1022    OT SHORT TERM GOAL #1   Title Patient will be educated and independent with HEP.   Time 3   Period Weeks   Status Achieved   OT SHORT TERM GOAL #2   Title Patient will increase AROM to WNL to increase ability to complete work tasks overhead with less difficulty.    Time 3   Period Weeks   Status Achieved   OT SHORT TERM GOAL #3   Title Patient will decrease pain in RUE to 5/10 when completing lifting tasks.    Time 3   Period Weeks   Status Achieved   OT SHORT TERM GOAL #4   Title Patient will increase strength to 4+/5 to increase ability to complete daily yardwork tasks.   Time 3   Period Weeks   Status  Achieved   OT SHORT TERM GOAL #5   Title Patient will decrease fascial restrictions to Min Amount.    Time 3   Period Weeks   Status Achieved           OT Long Term Goals - 12/20/14 1023    OT LONG TERM GOAL #1   Title Patient will return to highest level of independence  with all daily and work tasks.   Time 6   Period Weeks   Status Achieved   OT LONG TERM GOAL #2   Title Patient will increase RUE strength to 5/5 to increase ability to lift heavy items at home.   Time 6   Period Weeks   Status Achieved   OT LONG TERM GOAL #3   Title Patient will increase RUE shoulder stability by being able to complete dynamic yardwork tasks with less difficulty.    Time 6   Period Weeks   Status Achieved   OT LONG TERM GOAL #4   Title Patient will decrease fascial restrictions to trace amount.    Time 6   Period Weeks   Status Achieved               Plan - 01-08-15 1227    Clinical Impression Statement A: reassessment completed today. Patient met all therapy goals and has returned to doing all normal daily and work related tasks. Pt was given an updated HEP and is in agreement with discharge.   Plan P: D/C from therapy with HEP          G-Codes - 01/08/15 1233    Functional Assessment Tool Used FOTO score: 98/100 (2% impaired)   Functional Limitation Carrying, moving and handling objects   Carrying, Moving and Handling Objects Goal Status (R6045) At least 1 percent but less than 20 percent impaired, limited or restricted   Carrying, Moving and Handling Objects Discharge Status [phone]) At least 1 percent but less than 20 percent impaired, limited or restricted      Problem List Patient Active Problem List   Diagnosis Date Noted  . Right arm pain 10/31/2014  . Overuse injury 10/31/2014  . Cervical disc disorder with radiculopathy of cervical region 10/31/2014  . IBS (irritable bowel syndrome) 10/19/2013  . Back pain 01/02/2013  . Hyperlipidemia LDL goal <130  09/20/2012  . Essential hypertension, benign 09/20/2012  . GERD (gastroesophageal reflux disease) 12/30/2011  . GRANULOMA 08/27/2009  . Dysphagia 06/17/2009  . PROSTATITIS, HX OF 06/17/2009   OCCUPATIONAL THERAPY DISCHARGE SUMMARY  Visits from Start of Care: 9  Current functional level related to goals / functional outcomes: See goals above   Remaining deficits: None.   Education / Equipment: Proper lifting and body mechanics, green theraband exercises, scapular strengthening exercises, shoulder stretches Plan: Patient agrees to discharge.  Patient goals were met. Patient is being discharged due to meeting the stated rehab goals.  ?????       Ailene Ravel, OTR/L,CBIS  [phone]  01-08-15, 12:36 PM  [facility] [address] [place], [place], [address] Phone: [phone]   Fax:  [phone]

## 2014-12-20 NOTE — Patient Instructions (Signed)
Strengthening: Chest Pull - Resisted   Hold Theraband in front of body with hands about shoulder width a part. Pull band a part and back together slowly. Repeat __10-15__ times. Complete __1__ set(s) per session.. Repeat ____ session(s) per day.  http://orth.exer.us/926   Copyright  VHI. All rights reserved.   PNF Strengthening: Resisted   Standing with resistive band around each hand, bring right arm up and away, thumb back. Repeat _10-15___ times per set. Do __1__ sets per session. Do ____ sessions per day.  http://orth.exer.us/918   Copyright  VHI. All rights reserved.   PNF Strengthening: Resisted   Standing with resistive band around each hand, bring right arm up and across body. Repeat _10-15___ times per set. Do __1__ sets per session. Do ____ sessions per day.  http://orth.exer.us/920   Copyright  VHI. All rights reserved.    Resisted External Rotation: in Neutral - Bilateral   Sit or stand, tubing in both hands, elbows at sides, bent to 90, forearms forward. Pinch shoulder blades together and rotate forearms out. Keep elbows at sides. Repeat _10-15___ times per set. Do _1___ sets per session. Do ____ sessions per day.  http://orth.exer.us/966   Copyright  VHI. All rights reserved.   PNF Strengthening: Resisted   Standing, hold resistive band above head. Bring right arm down and out from side. Repeat __10-15__ times per set. Do __1__ sets per session. Do ____ sessions per day.  http://orth.exer.us/922   Copyright  VHI. All rights reserved.

## 2014-12-25 ENCOUNTER — Encounter (HOSPITAL_COMMUNITY): Payer: Medicare Other

## 2014-12-27 ENCOUNTER — Encounter (HOSPITAL_COMMUNITY): Payer: Medicare Other

## 2015-01-01 ENCOUNTER — Encounter (HOSPITAL_COMMUNITY): Payer: Medicare Other

## 2015-01-03 ENCOUNTER — Encounter (HOSPITAL_COMMUNITY): Payer: Medicare Other

## 2015-01-06 ENCOUNTER — Other Ambulatory Visit: Payer: Self-pay | Admitting: Family Medicine

## 2015-02-03 ENCOUNTER — Other Ambulatory Visit: Payer: Self-pay | Admitting: Family Medicine

## 2015-02-26 ENCOUNTER — Ambulatory Visit (INDEPENDENT_AMBULATORY_CARE_PROVIDER_SITE_OTHER): Payer: Medicare Other | Admitting: Family Medicine

## 2015-02-26 ENCOUNTER — Encounter: Payer: Self-pay | Admitting: Family Medicine

## 2015-02-26 ENCOUNTER — Ambulatory Visit (HOSPITAL_COMMUNITY)
Admission: RE | Admit: 2015-02-26 | Discharge: 2015-02-26 | Disposition: A | Discharge status: Home / Self Care | Payer: BLUE CROSS/BLUE SHIELD | Source: Ambulatory Visit | Attending: Family Medicine | Admitting: Family Medicine

## 2015-02-26 VITALS — BP 136/82 | Ht 64.0 in | Wt 177.2 lb

## 2015-02-26 DIAGNOSIS — Z89021 Acquired absence of right finger(s): Secondary | ICD-10-CM | POA: Diagnosis not present

## 2015-02-26 DIAGNOSIS — M79644 Pain in right finger(s): Secondary | ICD-10-CM | POA: Diagnosis not present

## 2015-02-26 DIAGNOSIS — Z23 Encounter for immunization: Secondary | ICD-10-CM

## 2015-02-26 MED ORDER — ETODOLAC 400 MG PO TABS: 400.0000 mg | ORAL_TABLET | Freq: Two times a day (BID) | ORAL | Status: DC

## 2015-02-26 NOTE — Progress Notes (Signed)
   Subjective:    Patient ID: Adam [name], male    DOB: 06-Aug-1950, 64 y.o.   MRN: [id number]  HPI Patient with c/o right pain thumb pain for 3 months.  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   He is continued use that right hand as his primary hand since he is right-handed. Next  Thumb at times is been aching over the years but in recent months has become more more painful. Swollen at times per particularly interphalangeal joint. No numbness   Review of Systems No wrist pain no elbow pain no injury no rash    Objective:   Physical Exam  Alert vitals stable lungs clear heart rare rhythm and substantial swollen of interphalangeal joint right thumb      Assessment & Plan:  Impression likely right thumb arthritis with history of altered mechanics down through the years with traumatic finger amputation plan [place] to use when necessary. X-ray of hand. Further recommendations based results WSL

## 2015-05-18 ENCOUNTER — Other Ambulatory Visit: Payer: Self-pay | Admitting: Family Medicine

## 2015-05-26 ENCOUNTER — Other Ambulatory Visit: Payer: Self-pay | Admitting: Family Medicine

## 2015-06-09 HISTORY — PX: SHOULDER SURGERY: SHX246

## 2015-06-10 ENCOUNTER — Encounter (HOSPITAL_COMMUNITY): Payer: Self-pay | Admitting: *Deleted

## 2015-06-10 ENCOUNTER — Emergency Department (HOSPITAL_COMMUNITY)
Admission: EM | Admit: 2015-06-10 | Discharge: 2015-06-10 | Disposition: A | Discharge status: Home / Self Care | Payer: BLUE CROSS/BLUE SHIELD | Attending: Emergency Medicine | Admitting: Emergency Medicine

## 2015-06-10 DIAGNOSIS — E785 Hyperlipidemia, unspecified: Secondary | ICD-10-CM | POA: Insufficient documentation

## 2015-06-10 DIAGNOSIS — K2970 Gastritis, unspecified, without bleeding: Secondary | ICD-10-CM | POA: Diagnosis not present

## 2015-06-10 DIAGNOSIS — Z8589 Personal history of malignant neoplasm of other organs and systems: Secondary | ICD-10-CM | POA: Insufficient documentation

## 2015-06-10 DIAGNOSIS — R1084 Generalized abdominal pain: Secondary | ICD-10-CM | POA: Diagnosis not present

## 2015-06-10 DIAGNOSIS — Z87891 Personal history of nicotine dependence: Secondary | ICD-10-CM | POA: Insufficient documentation

## 2015-06-10 DIAGNOSIS — F329 Major depressive disorder, single episode, unspecified: Secondary | ICD-10-CM | POA: Diagnosis not present

## 2015-06-10 DIAGNOSIS — R197 Diarrhea, unspecified: Secondary | ICD-10-CM | POA: Diagnosis not present

## 2015-06-10 DIAGNOSIS — K259 Gastric ulcer, unspecified as acute or chronic, without hemorrhage or perforation: Secondary | ICD-10-CM | POA: Diagnosis not present

## 2015-06-10 DIAGNOSIS — G8929 Other chronic pain: Secondary | ICD-10-CM | POA: Insufficient documentation

## 2015-06-10 DIAGNOSIS — Z79899 Other long term (current) drug therapy: Secondary | ICD-10-CM | POA: Diagnosis not present

## 2015-06-10 DIAGNOSIS — K529 Noninfective gastroenteritis and colitis, unspecified: Secondary | ICD-10-CM | POA: Insufficient documentation

## 2015-06-10 DIAGNOSIS — N400 Enlarged prostate without lower urinary tract symptoms: Secondary | ICD-10-CM | POA: Diagnosis not present

## 2015-06-10 DIAGNOSIS — K219 Gastro-esophageal reflux disease without esophagitis: Secondary | ICD-10-CM | POA: Insufficient documentation

## 2015-06-10 DIAGNOSIS — R112 Nausea with vomiting, unspecified: Secondary | ICD-10-CM | POA: Diagnosis not present

## 2015-06-10 DIAGNOSIS — I10 Essential (primary) hypertension: Secondary | ICD-10-CM | POA: Diagnosis not present

## 2015-06-10 LAB — CBC
HCT: 44 % (ref 39.0–52.0)
HEMOGLOBIN: 15.1 g/dL (ref 13.0–17.0)
MCH: 29.1 pg (ref 26.0–34.0)
MCHC: 34.3 g/dL (ref 30.0–36.0)
MCV: 84.8 fL (ref 78.0–100.0)
Platelets: 242 10*3/uL (ref 150–400)
RBC: 5.19 MIL/uL (ref 4.22–5.81)
RDW: 13.7 % (ref 11.5–15.5)
WBC: 10.1 10*3/uL (ref 4.0–10.5)

## 2015-06-10 LAB — COMPREHENSIVE METABOLIC PANEL
ALBUMIN: 3.7 g/dL (ref 3.5–5.0)
ALK PHOS: 55 U/L (ref 38–126)
ALT: 25 U/L (ref 17–63)
ANION GAP: 8 (ref 5–15)
AST: 30 U/L (ref 15–41)
BILIRUBIN TOTAL: 0.5 mg/dL (ref 0.3–1.2)
BUN: 24 mg/dL — AB (ref 6–20)
CALCIUM: 9.1 mg/dL (ref 8.9–10.3)
CO2: 28 mmol/L (ref 22–32)
Chloride: 102 mmol/L (ref 101–111)
Creatinine, Ser: 1.43 mg/dL — ABNORMAL HIGH (ref 0.61–1.24)
GFR calc Af Amer: 57 mL/min — ABNORMAL LOW (ref >60)
GFR, EST NON AFRICAN AMERICAN: 50 mL/min — AB (ref >60)
GLUCOSE: 134 mg/dL — AB (ref 65–99)
POTASSIUM: 4.5 mmol/L (ref 3.5–5.1)
Sodium: 138 mmol/L (ref 135–145)
TOTAL PROTEIN: 6.8 g/dL (ref 6.5–8.1)

## 2015-06-10 LAB — URINALYSIS, ROUTINE W REFLEX MICROSCOPIC
BILIRUBIN URINE: NEGATIVE
Glucose, UA: NEGATIVE mg/dL
LEUKOCYTES UA: NEGATIVE
NITRITE: NEGATIVE
PROTEIN: 30 mg/dL — AB
Specific Gravity, Urine: >1.03 — ABNORMAL HIGH (ref 1.005–1.030)
pH: 6 (ref 5.0–8.0)

## 2015-06-10 LAB — URINE MICROSCOPIC-ADD ON

## 2015-06-10 LAB — LIPASE, BLOOD: Lipase: 24 U/L (ref 11–51)

## 2015-06-10 MED ORDER — ONDANSETRON HCL 4 MG/2ML IJ SOLN
4.0000 mg | Freq: Once | INTRAMUSCULAR | Status: AC
  Administered 2015-06-10: 4 mg via INTRAVENOUS
  Filled 2015-06-10: qty 2

## 2015-06-10 MED ORDER — SODIUM CHLORIDE 0.9 % IV BOLUS (SEPSIS)
1000.0000 mL | Freq: Once | INTRAVENOUS | Status: AC
  Administered 2015-06-10: 1000 mL via INTRAVENOUS

## 2015-06-10 MED ORDER — ONDANSETRON HCL 4 MG/2ML IJ SOLN: 4.0000 mg | Freq: Once | INTRAMUSCULAR | Status: DC | PRN

## 2015-06-10 MED ORDER — HYDROMORPHONE HCL 1 MG/ML IJ SOLN
0.5000 mg | Freq: Once | INTRAMUSCULAR | Status: AC
  Administered 2015-06-10: 0.5 mg via INTRAVENOUS
  Filled 2015-06-10: qty 1

## 2015-06-10 MED ORDER — KETOROLAC TROMETHAMINE 30 MG/ML IJ SOLN
30.0000 mg | Freq: Once | INTRAMUSCULAR | Status: AC
  Administered 2015-06-10: 30 mg via INTRAVENOUS
  Filled 2015-06-10: qty 1

## 2015-06-10 MED ORDER — DICYCLOMINE HCL 20 MG PO TABS: ORAL_TABLET | ORAL | Status: DC

## 2015-06-10 MED ORDER — ONDANSETRON 4 MG PO TBDP: ORAL_TABLET | ORAL | Status: DC

## 2015-06-10 NOTE — ED Notes (Signed)
Patient verbalizes understanding of discharge instructions, prescription medications, home care and follow up care. Patient out of department at this time with family. 

## 2015-06-10 NOTE — Discharge Instructions (Signed)
Drink plenty of fluids. Follow-up with her family doctor if not improving °

## 2015-06-10 NOTE — ED Notes (Signed)
Pt c/o n/v/d and abdominal pain since 3pm today; pt has bradycardia which is new; pt denies any chest pain

## 2015-06-10 NOTE — ED Provider Notes (Signed)
CSN: [id number]     Arrival date & time 06/10/15  1905 History   First MD Initiated Contact with Patient 06/10/15 1918     Chief Complaint  Patient presents with  . Abdominal Pain     (Consider location/radiation/quality/duration/timing/severity/associated sxs/prior Treatment) Patient is a 65 y.o. male presenting with abdominal pain. The history is provided by the patient (Patient complains of vomiting and diarrhea).  Abdominal Pain Pain location:  Generalized Pain quality: aching   Pain radiates to:  Does not radiate Pain severity:  Moderate Onset quality:  Gradual Timing:  Constant Progression:  Waxing and waning Chronicity:  New Associated symptoms: diarrhea and vomiting   Associated symptoms: no chest pain, no cough, no fatigue and no hematuria     Past Medical History  Diagnosis Date  . Eosinophilic granuloma ([place]) JAN 2011 GASTRIC, followed by Dr. Eugenia Pancoast    EGD JAN 2011, JUN 2011, NOV 2011  . HTN (hypertension)   . Hyperlipemia   . Depression   . BMI 31.0-31.9,adult JUNE 2011 180 LBS   . GERD (gastroesophageal reflux disease)   . Prostate hypertrophy   . IBS (irritable bowel syndrome)   . Allergy   . Chronic back pain   . Gastric ulcer 08/31/2012   Past Surgical History  Procedure Laterality Date  . Hand surgery  at the age of 75    Brother accidentally cut it  . Ganglion cyst excision      from L wrist and pins placed for Fx involving L thumb  . Back surgery    . Tumor removal  1994    [facility]  . Cyst removed      from Left leg and knee  . Upper gastrointestinal endoscopy  JAN 2011    GASTRIC ULCER-EOS GRANULOMA  . Upper gastrointestinal endoscopy  JUN 2011    GASTRIC ULCER- EOS, NO H. PYLORI  . Upper gastrointestinal endoscopy  NOV 2011    GASTRIC NODULE, no ulcer- EOS  . Colonoscopy  JAN 2011 COMPLETE    hypoTN and bradycardia w/ Demerol ,Phenergan and Versed, [place] TICS, SML IH  . Colonoscopy  2003    NUR-normal  .  Colonoscopy  06/21/09    Fields-sigmoid diverticulosis, sm int hemorrhoids  . Mass excision  04/29/2012    Procedure: EXCISION MASS;  Surgeon: Donato Heinz, MD;  Location: [facility] ORS;  Service: General;  Laterality: Right;  Excision of Vascular Mass Right Arm  . Esophagogastroduodenoscopy  05/20/2012    SLF: MILD ESOPHAGITIS.NO BARRETT'S/Multiple ulcers ranging between 3-5 mm/MILD Duodenal inflammation was found in the duodenal bulb  . Esophagogastroduodenoscopy N/A 09/05/2012    SLF: MILD Non-erosive gastritis (inflammation) in the gastric antrum. Biopsies benign, no H. pylori.  . Colonoscopy N/A 10/24/2013    Normal mucosa in the terminal ileum Moderate diverticulosis noted in the sigmoid colonModerate sized internal hemorrhoids. negative random colon bx.. next TCS 10/2023  . Esophagogastroduodenoscopy N/A 10/24/2013    [id number] DISTAL ESOPHAGEAL WEBSmall hiatal herniaMILD Non-erosive gastritis. SB bx benign. gastric bx, minimal inflammation.   Venia Minks dilation N/A 10/24/2013    Procedure: Venia Minks DILATION;  Surgeon: Danie Binder, MD;  Location: [facility];  Service: Endoscopy;  Laterality: N/A;  . Savory dilation N/A 10/24/2013    Procedure: SAVORY DILATION;  Surgeon: Danie Binder, MD;  Location: [facility];  Service: Endoscopy;  Laterality: N/A;   Family History  Problem Relation Age of Onset  . Colon cancer Maternal Grandmother  OVER 60  . Hypertension Mother   . Diabetes Mother   . Hyperlipidemia Mother   . Thyroid disease Mother   . Hypertension Father   . Diabetes Father   . Stroke Father    Social History  Substance Use Topics  . Smoking status: Former Smoker -- 0.25 packs/day for 15 years    Types: Cigars    Quit date: 04/29/2002  . Smokeless tobacco: None  . Alcohol Use: Yes     Comment: mixed drink 1-2 per mo    Review of Systems  Constitutional: Negative for appetite change and fatigue.  HENT: Negative for congestion, ear discharge and sinus pressure.    Eyes: Negative for discharge.  Respiratory: Negative for cough.   Cardiovascular: Negative for chest pain.  Gastrointestinal: Positive for vomiting, abdominal pain and diarrhea.  Genitourinary: Negative for frequency and hematuria.  Musculoskeletal: Negative for back pain.  Skin: Negative for rash.  Neurological: Negative for seizures and headaches.  Psychiatric/Behavioral: Negative for hallucinations.      Allergies  Sulfonamide derivatives  Home Medications   Prior to Admission medications   Medication Sig Start Date End Date Taking? Authorizing Provider  doxazosin (CARDURA) 8 MG tablet TAKE 1 TABLET BY MOUTH AT BEDTIME 05/20/15  Yes [name], MD  enalapril (VASOTEC) 20 MG tablet TAKE 1 TABLET BY MOUTH EVERY DAY 10/15/14  Yes [name], MD  loratadine (CLARITIN) 10 MG tablet Take 10 mg by mouth daily. PRN   Yes Historical Provider, MD  pantoprazole (PROTONIX) 40 MG tablet Take 1 tablet (40 mg total) by mouth 2 (two) times daily. 11/28/14  Yes Danie Binder, MD  PARoxetine (PAXIL) 20 MG tablet TAKE 1 TABLET BY MOUTH EVERY DAY 10/15/14  Yes [name], MD  pravastatin (PRAVACHOL) 20 MG tablet TAKE 1 TABLET BY MOUTH AT BEDTIME 10/15/14  Yes [name], MD  verapamil (CALAN-SR) 180 MG CR tablet TAKE 1 TABLET BY MOUTH TWICE A DAY 05/27/15  Yes [name], MD  dicyclomine (BENTYL) 20 MG tablet Take one every 6 hours as needed for abdominal cramps 06/10/15   Milton Ferguson, MD  etodolac (LODINE) 400 MG tablet Take 1 tablet (400 mg total) by mouth 2 (two) times daily. With food Patient not taking: Reported on 06/10/2015 02/26/15   [name], MD  gentamicin (GARAMYCIN) 0.3 % ophthalmic solution Place 2 drops into the right eye 4 (four) times daily. Patient not taking: Reported on 10/31/2014 06/17/14   Veryl Speak, MD  ondansetron (ZOFRAN ODT) 4 MG disintegrating tablet 4mg  ODT q4 hours prn nausea/vomit 06/10/15   Milton Ferguson, MD   BP 149/93 mmHg  Pulse 54   Temp(Src) 98.4 F (36.9 C) (Oral)  Resp 25  Ht 5\' 4"  (1.626 m)  Wt 180 lb (81.647 kg)  BMI 30.88 kg/m2  SpO2 100% Physical Exam  Constitutional: He is oriented to person, place, and time. He appears well-developed.  HENT:  Head: Normocephalic.  Eyes: Conjunctivae and EOM are normal. No scleral icterus.  Neck: Neck supple. No thyromegaly present.  Cardiovascular: Normal rate and regular rhythm.  Exam reveals no gallop and no friction rub.   No murmur heard. Pulmonary/Chest: No stridor. He has no wheezes. He has no rales. He exhibits no tenderness.  Abdominal: He exhibits no distension. There is no tenderness. There is no rebound.  Musculoskeletal: Normal range of motion. He exhibits no edema.  Lymphadenopathy:    He has no cervical adenopathy.  Neurological: He is  oriented to person, place, and time. He exhibits normal muscle tone. Coordination normal.  Skin: No rash noted. No erythema.  Psychiatric: He has a normal mood and affect. His behavior is normal.    ED Course  Procedures (including critical care time) Labs Review Labs Reviewed  COMPREHENSIVE METABOLIC PANEL - Abnormal; Notable for the following:    Glucose, Bld 134 (*)    BUN 24 (*)    Creatinine, Ser 1.43 (*)    GFR calc non Af Amer 50 (*)    GFR calc Af Amer 57 (*)    All other components within normal limits  URINALYSIS, ROUTINE W REFLEX MICROSCOPIC (NOT AT [facility]) - Abnormal; Notable for the following:    Specific Gravity, Urine >1.030 (*)    Hgb urine dipstick TRACE (*)    Ketones, ur TRACE (*)    Protein, ur 30 (*)    All other components within normal limits  URINE MICROSCOPIC-ADD ON - Abnormal; Notable for the following:    Squamous Epithelial / LPF 6-30 (*)    Bacteria, UA RARE (*)    All other components within normal limits  LIPASE, BLOOD  CBC    Imaging Review No results found. I have personally reviewed and evaluated these images and lab results as part of my medical decision-making.   EKG  Interpretation None      MDM   Final diagnoses:  Gastroenteritis    Gastroenteritis patient improved with fluids. Patient will be sent home on Bentyl and Zofran and will follow-up with PCP    Milton Ferguson, MD 06/10/15 [phone]

## 2015-06-11 ENCOUNTER — Encounter: Payer: Self-pay | Admitting: Family Medicine

## 2015-06-11 ENCOUNTER — Ambulatory Visit (INDEPENDENT_AMBULATORY_CARE_PROVIDER_SITE_OTHER): Payer: Medicare Other | Admitting: Family Medicine

## 2015-06-11 VITALS — BP 126/84 | HR 60 | Temp 98.6°F | Ht 64.0 in | Wt 179.0 lb

## 2015-06-11 DIAGNOSIS — R001 Bradycardia, unspecified: Secondary | ICD-10-CM | POA: Diagnosis not present

## 2015-06-11 DIAGNOSIS — Z139 Encounter for screening, unspecified: Secondary | ICD-10-CM | POA: Diagnosis not present

## 2015-06-11 DIAGNOSIS — Z79899 Other long term (current) drug therapy: Secondary | ICD-10-CM

## 2015-06-11 DIAGNOSIS — E785 Hyperlipidemia, unspecified: Secondary | ICD-10-CM | POA: Diagnosis not present

## 2015-06-11 DIAGNOSIS — I10 Essential (primary) hypertension: Secondary | ICD-10-CM | POA: Diagnosis not present

## 2015-06-11 DIAGNOSIS — B349 Viral infection, unspecified: Secondary | ICD-10-CM | POA: Diagnosis not present

## 2015-06-11 DIAGNOSIS — Z125 Encounter for screening for malignant neoplasm of prostate: Secondary | ICD-10-CM | POA: Diagnosis not present

## 2015-06-11 NOTE — Progress Notes (Signed)
   Subjective:    Patient ID: Adam [name], male    DOB: 1950-06-26, 65 y.o.   MRN: [id number] Patient arrives office with several distinct concerns Abdominal Pain This is a new problem. The current episode started in the past 7 days. Associated symptoms include diarrhea, a fever and vomiting. Associated symptoms comments: Headache, sore throat, stuffy nose..   ongoing cramping in abdomen. Ongoing loose stools. Fortunately vomiting has abated energy level still not the best appetite good  Patient in today for a ER follow up from 06/10/15. Patient was seen for decreased heart rate (See ER note). This concerns the family greatly. Patient's wife claims patient was also most admitted because of low heart rate.  Patient's wife also very worried patient was told his creatinine was elevated. Wonders about importance of this.  Patient states no other concerns this visit.   Review of Systems  Constitutional: Positive for fever.  Gastrointestinal: Positive for vomiting, abdominal pain and diarrhea.   no loss of consciousness no chest pain no headache     Objective:   Physical Exam  Alert vitals stable HEENT normal lungs clear. Heart rate at 60 regular rhythm auscultated abdomen hyperactive bowel sounds mild left lower quadrant tenderness.  Entire hospital records reviewed family's presence  EKG normal sinus rhythm 60 bpm Q wave in lead 3 nonspecific      Assessment & Plan:  Impression 1 bradycardia patient's heart rate is borderline low however hemodynamically he is fine, recent GI symptoms, plus on verapamil LEAD to this. Reassurance attempted #2 slight elevation creatinine. Mega-concerned upon family's part there are some risk factors #3 viral breasts rhinitis discussed clinically improving plan easily 25 minutes spent most in discussion recheck in 3 months for wellness plus chronic visit. Appropriate blood work prior to the nipple follow-up creatinine of WSL

## 2015-07-02 ENCOUNTER — Other Ambulatory Visit: Payer: Self-pay | Admitting: Family Medicine

## 2015-07-12 ENCOUNTER — Other Ambulatory Visit: Payer: Self-pay | Admitting: Family Medicine

## 2015-07-12 NOTE — Telephone Encounter (Signed)
Ok six mo chronic meds

## 2015-07-12 NOTE — Telephone Encounter (Signed)
May we refill Paxil?

## 2015-07-30 ENCOUNTER — Ambulatory Visit (INDEPENDENT_AMBULATORY_CARE_PROVIDER_SITE_OTHER): Payer: Medicare Other | Admitting: Urology

## 2015-07-30 DIAGNOSIS — N401 Enlarged prostate with lower urinary tract symptoms: Secondary | ICD-10-CM | POA: Diagnosis not present

## 2015-07-30 DIAGNOSIS — N320 Bladder-neck obstruction: Secondary | ICD-10-CM | POA: Diagnosis not present

## 2015-07-30 DIAGNOSIS — R300 Dysuria: Secondary | ICD-10-CM | POA: Diagnosis not present

## 2015-08-04 ENCOUNTER — Other Ambulatory Visit: Payer: Self-pay | Admitting: Family Medicine

## 2015-08-30 DIAGNOSIS — Z125 Encounter for screening for malignant neoplasm of prostate: Secondary | ICD-10-CM | POA: Diagnosis not present

## 2015-08-30 DIAGNOSIS — I10 Essential (primary) hypertension: Secondary | ICD-10-CM | POA: Diagnosis not present

## 2015-08-30 DIAGNOSIS — Z79899 Other long term (current) drug therapy: Secondary | ICD-10-CM | POA: Diagnosis not present

## 2015-08-30 DIAGNOSIS — E785 Hyperlipidemia, unspecified: Secondary | ICD-10-CM | POA: Diagnosis not present

## 2015-08-30 DIAGNOSIS — Z139 Encounter for screening, unspecified: Secondary | ICD-10-CM | POA: Diagnosis not present

## 2015-08-31 LAB — HEPATIC FUNCTION PANEL
ALBUMIN: 4.3 g/dL (ref 3.6–4.8)
ALK PHOS: 53 IU/L (ref 39–117)
ALT: 18 IU/L (ref 0–44)
AST: 21 IU/L (ref 0–40)
BILIRUBIN TOTAL: 0.7 mg/dL (ref 0.0–1.2)
Bilirubin, Direct: 0.18 mg/dL (ref 0.00–0.40)
TOTAL PROTEIN: 6.3 g/dL (ref 6.0–8.5)

## 2015-08-31 LAB — BASIC METABOLIC PANEL
BUN/Creatinine Ratio: 10 (ref 10–22)
BUN: 13 mg/dL (ref 8–27)
CO2: 24 mmol/L (ref 18–29)
CREATININE: 1.24 mg/dL (ref 0.76–1.27)
Calcium: 9.5 mg/dL (ref 8.6–10.2)
Chloride: 102 mmol/L (ref 96–106)
GFR, EST AFRICAN AMERICAN: 70 mL/min/{1.73_m2} (ref >59)
GFR, EST NON AFRICAN AMERICAN: 60 mL/min/{1.73_m2} (ref >59)
Glucose: 107 mg/dL — ABNORMAL HIGH (ref 65–99)
POTASSIUM: 4.4 mmol/L (ref 3.5–5.2)
SODIUM: 141 mmol/L (ref 134–144)

## 2015-08-31 LAB — LIPID PANEL
CHOLESTEROL TOTAL: 198 mg/dL (ref 100–199)
Chol/HDL Ratio: 2.2 ratio units (ref 0.0–5.0)
HDL: 92 mg/dL (ref >39)
LDL Calculated: 97 mg/dL (ref 0–99)
Triglycerides: 44 mg/dL (ref 0–149)
VLDL Cholesterol Cal: 9 mg/dL (ref 5–40)

## 2015-08-31 LAB — PSA: Prostate Specific Ag, Serum: 1.3 ng/mL (ref 0.0–4.0)

## 2015-09-02 ENCOUNTER — Other Ambulatory Visit: Payer: Self-pay | Admitting: Family Medicine

## 2015-09-09 ENCOUNTER — Encounter: Payer: Self-pay | Admitting: Family Medicine

## 2015-09-09 ENCOUNTER — Ambulatory Visit (INDEPENDENT_AMBULATORY_CARE_PROVIDER_SITE_OTHER): Payer: BLUE CROSS/BLUE SHIELD | Admitting: Family Medicine

## 2015-09-09 VITALS — BP 138/90 | Ht 67.0 in | Wt 170.8 lb

## 2015-09-09 DIAGNOSIS — I10 Essential (primary) hypertension: Secondary | ICD-10-CM | POA: Diagnosis not present

## 2015-09-09 DIAGNOSIS — L989 Disorder of the skin and subcutaneous tissue, unspecified: Secondary | ICD-10-CM | POA: Diagnosis not present

## 2015-09-09 DIAGNOSIS — E785 Hyperlipidemia, unspecified: Secondary | ICD-10-CM

## 2015-09-09 DIAGNOSIS — Z0189 Encounter for other specified special examinations: Secondary | ICD-10-CM

## 2015-09-09 DIAGNOSIS — Z0000 Encounter for general adult medical examination without abnormal findings: Secondary | ICD-10-CM

## 2015-09-09 MED ORDER — VERAPAMIL HCL ER 180 MG PO TBCR: 180.0000 mg | EXTENDED_RELEASE_TABLET | Freq: Two times a day (BID) | ORAL | Status: DC

## 2015-09-09 MED ORDER — PAROXETINE HCL 20 MG PO TABS: 20.0000 mg | ORAL_TABLET | Freq: Every day | ORAL | Status: DC

## 2015-09-09 MED ORDER — DOXAZOSIN MESYLATE 8 MG PO TABS: 8.0000 mg | ORAL_TABLET | Freq: Every day | ORAL | Status: DC

## 2015-09-09 MED ORDER — ENALAPRIL MALEATE 20 MG PO TABS: 20.0000 mg | ORAL_TABLET | Freq: Every day | ORAL | Status: DC

## 2015-09-09 MED ORDER — PRAVASTATIN SODIUM 20 MG PO TABS: 20.0000 mg | ORAL_TABLET | Freq: Every day | ORAL | Status: DC

## 2015-09-09 MED ORDER — AMOXICILLIN 500 MG PO CAPS: 500.0000 mg | ORAL_CAPSULE | Freq: Three times a day (TID) | ORAL | Status: DC

## 2015-09-09 NOTE — Progress Notes (Signed)
Subjective:    Patient ID: Adam [name], male    DOB: 09/29/1950, 65 y.o.   MRN: [id number]  HPI  .AWV- Annual Wellness Visit  The patient was seen for their annual wellness visit. The patient's past medical history, surgical history, and family history were reviewed. Pertinent vaccines were reviewed ( tetanus, pneumonia, shingles, flu) The patient's medication list was reviewed and updated.  The height and weight were entered. The patient's current BMI is:26.75  Cognitive screening was completed. Outcome of Mini - Cog: pass  Falls within the past 6 months:none  Current tobacco usage: none (All patients who use tobacco were given written and verbal information on quitting)  Recent listing of emergency department/hospitalizations over the past year were reviewed.  current specialist the patient sees on a regular basis: none   Medicare annual wellness visit patient questionnaire was reviewed.  A written screening schedule for the patient for the next 5-10 years was given. Appropriate discussion of followup regarding next visit was discussed.  Patient concerned with cough, comes and goes. Tickling sensation , pt feels like might have a cold but does not have. [os hx of allergies.cough Bronchial at times.   and growth above left ear, has been growing for a number of months. No bleeding. Next  Compliant blood pressure medicine does not miss a dose watch his salt intake blood pressures good when checked elsewhere  Compliant with antidepressant medicine no obvious side effects    Results for orders placed or performed in visit on 06/11/15  Lipid panel  Result Value Ref Range   Cholesterol, Total 198 100 - 199 mg/dL   Triglycerides 44 0 - 149 mg/dL   HDL 92 >39 mg/dL   VLDL Cholesterol Cal 9 5 - 40 mg/dL   LDL Calculated 97 0 - 99 mg/dL   Chol/HDL Ratio 2.2 0.0 - 5.0 ratio units  Hepatic function panel  Result Value Ref Range   Total Protein 6.3 6.0 - 8.5 g/dL   Albumin 4.3 3.6 - 4.8 g/dL   Bilirubin Total 0.7 0.0 - 1.2 mg/dL   Bilirubin, Direct 0.18 0.00 - 0.40 mg/dL   Alkaline Phosphatase 53 39 - 117 IU/L   AST 21 0 - 40 IU/L   ALT 18 0 - 44 IU/L  Basic metabolic panel  Result Value Ref Range   Glucose 107 (H) 65 - 99 mg/dL   BUN 13 8 - 27 mg/dL   Creatinine, Ser 1.24 0.76 - 1.27 mg/dL   GFR calc non Af Amer 60 >59 mL/min/1.73   GFR calc Af Amer 70 >59 mL/min/1.73   BUN/Creatinine Ratio 10 10 - 22   Sodium 141 134 - 144 mmol/L   Potassium 4.4 3.5 - 5.2 mmol/L   Chloride 102 96 - 106 mmol/L   CO2 24 18 - 29 mmol/L   Calcium 9.5 8.6 - 10.2 mg/dL  PSA  Result Value Ref Range   Prostate Specific Ag, Serum 1.3 0.0 - 4.0 ng/mL    Review of Systems  Constitutional: Negative for fever, activity change and appetite change.  HENT: Negative for congestion and rhinorrhea.   Eyes: Negative for discharge.  Respiratory: Negative for cough and wheezing.   Cardiovascular: Negative for chest pain.  Gastrointestinal: Negative for vomiting, abdominal pain and blood in stool.  Genitourinary: Negative for frequency and difficulty urinating.  Musculoskeletal: Negative for neck pain.  Skin: Negative for rash.  Allergic/Immunologic: Negative for environmental allergies and food allergies.  Neurological: Negative for weakness and headaches.  Psychiatric/Behavioral: Negative for agitation.  All other systems reviewed and are negative.      Objective:   Physical Exam  Constitutional: He appears well-developed and well-nourished.  HENT:  Head: Normocephalic and atraumatic.  Right Ear: External ear normal.  Left Ear: External ear normal.  Nose: Nose normal.  Mouth/Throat: Oropharynx is clear and moist.  Eyes: EOM are normal. Pupils are equal, round, and reactive to light.  Neck: Normal range of motion. Neck supple. No thyromegaly present.  Cardiovascular: Normal rate, regular rhythm and normal heart sounds.   No murmur heard. Pulmonary/Chest:  Effort normal and breath sounds normal. No respiratory distress. He has no wheezes.  Abdominal: Soft. Bowel sounds are normal. He exhibits no distension and no mass. There is no tenderness.  Genitourinary: Penis normal.  Musculoskeletal: Normal range of motion. He exhibits no edema.  Lymphadenopathy:    He has no cervical adenopathy.  Neurological: He is alert. He exhibits normal muscle tone.  Skin: Skin is warm and dry. No erythema.  Psychiatric: He has a normal mood and affect. His behavior is normal. Judgment normal.  Vitals reviewed.  Deep bronchial cough. During exam left lateral scalp hyper pigmented hypertrophic lesion       Assessment & Plan:  Derm eferralimpression 1 wellness exam up to date on colonoscopy #2 hypertension good control maintain same meds discussed #3 scalp lesion likely benign discussed we'll press on with dermal referral #4 bronchial cough impressive during exam off-and-on several weeks plan all medications refilled. Diet exercise discussed. Amoxicillin 3 times a day 10 days. Dermatology referral. Diet exercise discussed WSL

## 2015-09-09 NOTE — Patient Instructions (Signed)
Results for orders placed or performed in visit on 06/11/15  Lipid panel  Result Value Ref Range   Cholesterol, Total 198 100 - 199 mg/dL   Triglycerides 44 0 - 149 mg/dL   HDL 92 >39 mg/dL   VLDL Cholesterol Cal 9 5 - 40 mg/dL   LDL Calculated 97 0 - 99 mg/dL   Chol/HDL Ratio 2.2 0.0 - 5.0 ratio units  Hepatic function panel  Result Value Ref Range   Total Protein 6.3 6.0 - 8.5 g/dL   Albumin 4.3 3.6 - 4.8 g/dL   Bilirubin Total 0.7 0.0 - 1.2 mg/dL   Bilirubin, Direct 0.18 0.00 - 0.40 mg/dL   Alkaline Phosphatase 53 39 - 117 IU/L   AST 21 0 - 40 IU/L   ALT 18 0 - 44 IU/L  Basic metabolic panel  Result Value Ref Range   Glucose 107 (H) 65 - 99 mg/dL   BUN 13 8 - 27 mg/dL   Creatinine, Ser 1.24 0.76 - 1.27 mg/dL   GFR calc non Af Amer 60 >59 mL/min/1.73   GFR calc Af Amer 70 >59 mL/min/1.73   BUN/Creatinine Ratio 10 10 - 22   Sodium 141 134 - 144 mmol/L   Potassium 4.4 3.5 - 5.2 mmol/L   Chloride 102 96 - 106 mmol/L   CO2 24 18 - 29 mmol/L   Calcium 9.5 8.6 - 10.2 mg/dL  PSA  Result Value Ref Range   Prostate Specific Ag, Serum 1.3 0.0 - 4.0 ng/mL

## 2015-09-10 ENCOUNTER — Encounter: Payer: Self-pay | Admitting: Family Medicine

## 2015-09-25 DIAGNOSIS — B078 Other viral warts: Secondary | ICD-10-CM | POA: Diagnosis not present

## 2015-12-09 ENCOUNTER — Other Ambulatory Visit: Payer: Self-pay

## 2015-12-11 MED ORDER — PANTOPRAZOLE SODIUM 40 MG PO TBEC: 40.0000 mg | DELAYED_RELEASE_TABLET | Freq: Two times a day (BID) | ORAL | Status: DC

## 2015-12-12 ENCOUNTER — Other Ambulatory Visit: Payer: Self-pay

## 2015-12-12 MED ORDER — PANTOPRAZOLE SODIUM 40 MG PO TBEC: 40.0000 mg | DELAYED_RELEASE_TABLET | Freq: Two times a day (BID) | ORAL | Status: DC

## 2016-03-10 ENCOUNTER — Ambulatory Visit (INDEPENDENT_AMBULATORY_CARE_PROVIDER_SITE_OTHER): Payer: BLUE CROSS/BLUE SHIELD | Admitting: Family Medicine

## 2016-03-10 ENCOUNTER — Ambulatory Visit (HOSPITAL_COMMUNITY)
Admission: RE | Admit: 2016-03-10 | Discharge: 2016-03-10 | Disposition: A | Discharge status: Home / Self Care | Payer: BLUE CROSS/BLUE SHIELD | Source: Ambulatory Visit | Attending: Family Medicine | Admitting: Family Medicine

## 2016-03-10 ENCOUNTER — Encounter: Payer: Self-pay | Admitting: Family Medicine

## 2016-03-10 VITALS — BP 140/82 | Ht 67.0 in | Wt 170.1 lb

## 2016-03-10 DIAGNOSIS — Z23 Encounter for immunization: Secondary | ICD-10-CM | POA: Diagnosis not present

## 2016-03-10 DIAGNOSIS — E7801 Familial hypercholesterolemia: Secondary | ICD-10-CM

## 2016-03-10 DIAGNOSIS — Z79899 Other long term (current) drug therapy: Secondary | ICD-10-CM

## 2016-03-10 DIAGNOSIS — F329 Major depressive disorder, single episode, unspecified: Secondary | ICD-10-CM | POA: Diagnosis not present

## 2016-03-10 DIAGNOSIS — F32A Depression, unspecified: Secondary | ICD-10-CM

## 2016-03-10 DIAGNOSIS — I10 Essential (primary) hypertension: Secondary | ICD-10-CM | POA: Diagnosis not present

## 2016-03-10 DIAGNOSIS — Z131 Encounter for screening for diabetes mellitus: Secondary | ICD-10-CM

## 2016-03-10 DIAGNOSIS — M25512 Pain in left shoulder: Secondary | ICD-10-CM | POA: Diagnosis not present

## 2016-03-10 DIAGNOSIS — M19012 Primary osteoarthritis, left shoulder: Secondary | ICD-10-CM | POA: Insufficient documentation

## 2016-03-10 MED ORDER — ETODOLAC 400 MG PO TABS: 400.0000 mg | ORAL_TABLET | Freq: Two times a day (BID) | ORAL | 0 refills | Status: DC

## 2016-03-10 MED ORDER — PRAVASTATIN SODIUM 20 MG PO TABS: 20.0000 mg | ORAL_TABLET | Freq: Every day | ORAL | 5 refills | Status: DC

## 2016-03-10 MED ORDER — METHYLPREDNISOLONE ACETATE 40 MG/ML IJ SUSP: 40.0000 mg | Freq: Once | INTRAMUSCULAR | Status: DC

## 2016-03-10 MED ORDER — ENALAPRIL MALEATE 20 MG PO TABS: 20.0000 mg | ORAL_TABLET | Freq: Every day | ORAL | 5 refills | Status: DC

## 2016-03-10 MED ORDER — DOXAZOSIN MESYLATE 8 MG PO TABS: 8.0000 mg | ORAL_TABLET | Freq: Every day | ORAL | 5 refills | Status: DC

## 2016-03-10 MED ORDER — VERAPAMIL HCL ER 180 MG PO TBCR: 180.0000 mg | EXTENDED_RELEASE_TABLET | Freq: Two times a day (BID) | ORAL | 5 refills | Status: DC

## 2016-03-10 MED ORDER — PAROXETINE HCL 20 MG PO TABS: 20.0000 mg | ORAL_TABLET | Freq: Every day | ORAL | 5 refills | Status: DC

## 2016-03-10 NOTE — Progress Notes (Signed)
Subjective:    Patient ID: Adam [name], male    DOB: 1950-12-13, 65 y.o.   MRN: [id number]  Hypertension  This is a chronic problem. The current episode started more than 1 year ago. The problem has been gradually improving since onset. There are no associated agents to hypertension. There are no known risk factors for coronary artery disease. Treatments tried: verapamil. The current treatment provides moderate improvement. There are no compliance problems.    Patient is having left shoulder pain. Onset 2-3 months ago. Happened over two mo , fell back and hit it when working on the tract, deep ache in the shoulder  Wants to get a flu shot  Blood pressure medicine and blood pressure levels reviewed today with patient. Compliant with blood pressure medicine. States does not miss a dose. No obvious side effects. Blood pressure generally good when checked elsewhere. Watching salt intake.   Patient continues to take lipid medication regularly. No obvious side effects from it. Generally does not miss a dose. Prior blood work results are reviewed with patient. Patient continues to work on fat intake in diet  Patient notes ongoing compliance with antidepressant medication. No obvious side effects. Reports does not miss a dose. Overall continues to help depression substantially. No thoughts of homicide or suicide. Would like to maintain medication.   Results for orders placed or performed in visit on 06/11/15  Lipid panel  Result Value Ref Range   Cholesterol, Total 198 100 - 199 mg/dL   Triglycerides 44 0 - 149 mg/dL   HDL 92 >39 mg/dL   VLDL Cholesterol Cal 9 5 - 40 mg/dL   LDL Calculated 97 0 - 99 mg/dL   Chol/HDL Ratio 2.2 0.0 - 5.0 ratio units  Hepatic function panel  Result Value Ref Range   Total Protein 6.3 6.0 - 8.5 g/dL   Albumin 4.3 3.6 - 4.8 g/dL   Bilirubin Total 0.7 0.0 - 1.2 mg/dL   Bilirubin, Direct 0.18 0.00 - 0.40 mg/dL   Alkaline Phosphatase 53 39 - 117 IU/L   AST 21 0 - 40 IU/L   ALT 18 0 - 44 IU/L  Basic metabolic panel  Result Value Ref Range   Glucose 107 (H) 65 - 99 mg/dL   BUN 13 8 - 27 mg/dL   Creatinine, Ser 1.24 0.76 - 1.27 mg/dL   GFR calc non Af Amer 60 >59 mL/min/1.73   GFR calc Af Amer 70 >59 mL/min/1.73   BUN/Creatinine Ratio 10 10 - 22   Sodium 141 134 - 144 mmol/L   Potassium 4.4 3.5 - 5.2 mmol/L   Chloride 102 96 - 106 mmol/L   CO2 24 18 - 29 mmol/L   Calcium 9.5 8.6 - 10.2 mg/dL  PSA  Result Value Ref Range   Prostate Specific Ag, Serum 1.3 0.0 - 4.0 ng/mL    Review of Systems No headache, no major weight loss or weight gain, no chest pain no back pain abdominal pain no change in bowel habits complete ROS otherwise negative     Objective:   Physical Exam bbe Alert vitals stable, NAD. Blood pressure good on repeat. HEENT normal. Lungs clear. Heart regular rate and rhythm. Shoulder positive impingement sign. Positive limitation extension. Almost frozen shoulder element  Patient was prepped draped injected with 1 mL Depo-Medrol 2 mL Xylocaine       Assessment & Plan:  Impression 1 hypertension good control discussed maintain same meds #2 hyperlipidemia prior control discussed maintain same pending  blood work #3 depression clinically stable maintain meds #4 impaired fasting glucose/prediabetes discussed status uncertain #5 shoulder impingement plan shoulder x-ray. Anti-inflammatory medicine. Recheck in 2 weeks. Flu shot. Appropriate blood work. Chronic medications refilled. Codman's exercises discussed and encouraged WSL

## 2016-03-12 LAB — HEPATIC FUNCTION PANEL
ALT: 18 IU/L (ref 0–44)
AST: 21 IU/L (ref 0–40)
Albumin: 4.5 g/dL (ref 3.6–4.8)
Alkaline Phosphatase: 56 IU/L (ref 39–117)
Bilirubin Total: 0.8 mg/dL (ref 0.0–1.2)
Bilirubin, Direct: 0.21 mg/dL (ref 0.00–0.40)
Total Protein: 6.7 g/dL (ref 6.0–8.5)

## 2016-03-12 LAB — LIPID PANEL
CHOL/HDL RATIO: 2.1 ratio (ref 0.0–5.0)
Cholesterol, Total: 216 mg/dL — ABNORMAL HIGH (ref 100–199)
HDL: 102 mg/dL (ref >39)
LDL CALC: 107 mg/dL — AB (ref 0–99)
TRIGLYCERIDES: 34 mg/dL (ref 0–149)
VLDL Cholesterol Cal: 7 mg/dL (ref 5–40)

## 2016-03-12 LAB — GLUCOSE, RANDOM: GLUCOSE: 94 mg/dL (ref 65–99)

## 2016-03-15 ENCOUNTER — Encounter: Payer: Self-pay | Admitting: Family Medicine

## 2016-04-01 ENCOUNTER — Encounter: Payer: Self-pay | Admitting: Family Medicine

## 2016-04-01 ENCOUNTER — Ambulatory Visit (INDEPENDENT_AMBULATORY_CARE_PROVIDER_SITE_OTHER): Payer: BLUE CROSS/BLUE SHIELD | Admitting: Family Medicine

## 2016-04-01 VITALS — BP 122/80 | Ht 67.0 in | Wt 171.0 lb

## 2016-04-01 DIAGNOSIS — E7801 Familial hypercholesterolemia: Secondary | ICD-10-CM | POA: Diagnosis not present

## 2016-04-01 DIAGNOSIS — I10 Essential (primary) hypertension: Secondary | ICD-10-CM | POA: Diagnosis not present

## 2016-04-01 DIAGNOSIS — E78019 Familial hypercholesterolemia, unspecified: Secondary | ICD-10-CM

## 2016-04-01 DIAGNOSIS — M25512 Pain in left shoulder: Secondary | ICD-10-CM | POA: Diagnosis not present

## 2016-04-01 NOTE — Progress Notes (Signed)
   Subjective:    Patient ID: Adam [name], male    DOB: January 02, 1951, 65 y.o.   MRN: [id number]  Hyperlipidemia  This is a chronic problem. The current episode started more than 1 year ago. Treatments tried: pravastatin  Compliance problems include adherence to diet.    Left shoulder pain follow up. Still having a little pain. Concerning to the patient limiting his activities  Patient continues to take lipid medication regularly. No obvious side effects from it. Generally does not miss a dose. Prior blood work results are reviewed with patient. Patient continues to work on fat intake in diet  Blood pressure medicine and blood pressure levels reviewed today with patient. Compliant with blood pressure medicine. States does not miss a dose. No obvious side effects. Blood pressure generally good when checked elsewhere. Watching salt intake.    Day # [id number]   Review of Systems No headache, no major weight loss or weight gain, no chest pain no back pain abdominal pain no change in bowel habits complete ROS otherwise negative     Objective:   Physical Exam  Alert vitals stable, NAD. Blood pressure good on repeat. HEENT normal. Lungs clear. Heart regular rate and rhythm. Left shoulder positive impingement sign      Assessment & Plan:  Impression shoulder pain persists despite interventions see prior note #2 hyperlipidemia discuss recommend maintaining current dose. HDL awesome discussed #3 hypertension good control discussed maintain same meds plan diet exercise discussed. Anti-inflammatory medicine for shoulder. Codman's exercises. Maintain current medications. Recheck in 6 months. Referral to orthopedic rationale discussed

## 2016-04-10 DIAGNOSIS — M25512 Pain in left shoulder: Secondary | ICD-10-CM | POA: Diagnosis not present

## 2016-04-15 DIAGNOSIS — M7592 Shoulder lesion, unspecified, left shoulder: Secondary | ICD-10-CM | POA: Diagnosis not present

## 2016-04-15 DIAGNOSIS — M62512 Muscle wasting and atrophy, not elsewhere classified, left shoulder: Secondary | ICD-10-CM | POA: Diagnosis not present

## 2016-04-15 DIAGNOSIS — S46012A Strain of muscle(s) and tendon(s) of the rotator cuff of left shoulder, initial encounter: Secondary | ICD-10-CM | POA: Diagnosis not present

## 2016-04-15 DIAGNOSIS — M25412 Effusion, left shoulder: Secondary | ICD-10-CM | POA: Diagnosis not present

## 2016-04-16 DIAGNOSIS — M25512 Pain in left shoulder: Secondary | ICD-10-CM | POA: Diagnosis not present

## 2016-05-06 DIAGNOSIS — M7522 Bicipital tendinitis, left shoulder: Secondary | ICD-10-CM | POA: Diagnosis not present

## 2016-05-06 DIAGNOSIS — M7542 Impingement syndrome of left shoulder: Secondary | ICD-10-CM | POA: Diagnosis not present

## 2016-05-06 DIAGNOSIS — M19012 Primary osteoarthritis, left shoulder: Secondary | ICD-10-CM | POA: Diagnosis not present

## 2016-05-06 DIAGNOSIS — M75122 Complete rotator cuff tear or rupture of left shoulder, not specified as traumatic: Secondary | ICD-10-CM | POA: Diagnosis not present

## 2016-05-06 DIAGNOSIS — M24112 Other articular cartilage disorders, left shoulder: Secondary | ICD-10-CM | POA: Diagnosis not present

## 2016-05-06 DIAGNOSIS — G8918 Other acute postprocedural pain: Secondary | ICD-10-CM | POA: Diagnosis not present

## 2016-05-06 DIAGNOSIS — S46012A Strain of muscle(s) and tendon(s) of the rotator cuff of left shoulder, initial encounter: Secondary | ICD-10-CM | POA: Diagnosis not present

## 2016-05-06 DIAGNOSIS — M7552 Bursitis of left shoulder: Secondary | ICD-10-CM | POA: Diagnosis not present

## 2016-05-11 ENCOUNTER — Ambulatory Visit (HOSPITAL_COMMUNITY): Payer: BLUE CROSS/BLUE SHIELD | Attending: Orthopedic Surgery

## 2016-05-11 ENCOUNTER — Encounter (HOSPITAL_COMMUNITY): Payer: Self-pay

## 2016-05-11 DIAGNOSIS — R29898 Other symptoms and signs involving the musculoskeletal system: Secondary | ICD-10-CM | POA: Insufficient documentation

## 2016-05-11 DIAGNOSIS — M25612 Stiffness of left shoulder, not elsewhere classified: Secondary | ICD-10-CM | POA: Diagnosis not present

## 2016-05-11 DIAGNOSIS — M25512 Pain in left shoulder: Secondary | ICD-10-CM | POA: Insufficient documentation

## 2016-05-11 NOTE — Therapy (Signed)
[place], [place], [address] Phone: [phone]   Fax:  [phone]  Occupational Therapy Evaluation  Patient Details  Name: Adam [name] MRN: [id number] Date of Birth: [birth year] Referring Provider: Earlie Server, MD  Encounter Date: 05/11/2016      OT End of Session - 05/11/16 1404    Visit Number 1   Number of Visits 24   Date for OT Re-Evaluation 07/10/16  mini ressess: 07/10/16   Authorization Type 1) Medicare 2) BSBC   Authorization Time Period before 10th visit   Authorization - Visit Number 1   Authorization - Number of Visits 10   OT Start Time [phone]   OT Stop Time 1030   OT Time Calculation (min) 37 min   Activity Tolerance Patient tolerated treatment well   Behavior During Therapy [facility] for tasks assessed/performed      Past Medical History:  Diagnosis Date  . Allergy   . BMI 31.0-31.9,adult JUNE 2011 180 LBS   . Chronic back pain   . Depression   . 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 Eugenia Pancoast   EGD JAN 2011, JUN 2011, NOV 2011  . Gastric ulcer 08/31/2012  . GERD (gastroesophageal reflux disease)   . HTN (hypertension)   . Hyperlipemia   . IBS (irritable bowel syndrome)   . Prostate hypertrophy     Past Surgical History:  Procedure Laterality Date  . BACK SURGERY    . COLONOSCOPY  JAN 2011 COMPLETE   hypoTN and bradycardia w/ Demerol ,Phenergan and Versed, [place] TICS, SML IH  . COLONOSCOPY  2003   NUR-normal  . COLONOSCOPY  06/21/09   Fields-sigmoid diverticulosis, sm int hemorrhoids  . COLONOSCOPY N/A 10/24/2013   Normal mucosa in the terminal ileum Moderate diverticulosis noted in the sigmoid colonModerate sized internal hemorrhoids. negative random colon bx.. next TCS 10/2023  . Cyst Removed     from Left leg and knee  . ESOPHAGOGASTRODUODENOSCOPY  05/20/2012   SLF: MILD ESOPHAGITIS.NO BARRETT'S/Multiple ulcers ranging between 3-5 mm/MILD Duodenal  inflammation was found in the duodenal bulb  . ESOPHAGOGASTRODUODENOSCOPY N/A 09/05/2012   SLF: MILD Non-erosive gastritis (inflammation) in the gastric antrum. Biopsies benign, no H. pylori.  . ESOPHAGOGASTRODUODENOSCOPY N/A 10/24/2013   [id number] DISTAL ESOPHAGEAL WEBSmall hiatal herniaMILD Non-erosive gastritis. SB bx benign. gastric bx, minimal inflammation.   Marland Kitchen GANGLION CYST EXCISION     from L wrist and pins placed for Fx involving L thumb  . HAND SURGERY  at the age of 65   Brother accidentally cut it  . MALONEY DILATION N/A 10/24/2013   Procedure: MALONEY DILATION;  Surgeon: Danie Binder, MD;  Location: [facility];  Service: Endoscopy;  Laterality: N/A;  . MASS EXCISION  04/29/2012   Procedure: EXCISION MASS;  Surgeon: Donato Heinz, MD;  Location: [facility] ORS;  Service: General;  Laterality: Right;  Excision of Vascular Mass Right Arm  . SAVORY DILATION N/A 10/24/2013   Procedure: SAVORY DILATION;  Surgeon: Danie Binder, MD;  Location: [facility];  Service: Endoscopy;  Laterality: N/A;  . TUMOR REMOVAL  1994   Benign [facility] brain at [facility]  . UPPER GASTROINTESTINAL ENDOSCOPY  JAN 2011   GASTRIC ULCER-EOS GRANULOMA  . UPPER GASTROINTESTINAL ENDOSCOPY  JUN 2011   GASTRIC ULCER- EOS, NO H. PYLORI  . UPPER GASTROINTESTINAL ENDOSCOPY  NOV 2011   GASTRIC NODULE, no ulcer- EOS    There were no vitals  filed for this visit.      Subjective Assessment - 05/11/16 0954    Subjective  S: This happened in July and I didn't go to the Doctor about it until October.   Pertinent History Patient is 65 y/o male S/P left RTC repair completed on 05/06/16 after sustaining a fall from a tractor in which he landed on his LUE resulting in tear of his rotator cuff. Patient is currently wearing his sling at all times except to bath and dress. Dr. Tamera Punt has referred him to occupationa therapy for evaluation and treatment.   Special Tests FOTO score: 4/100 (96% impaired)   Patient Stated  Goals To regain use of LUE and use it normally for daily tasks.   Currently in Pain? No/denies  Pt reports 7/10 pain if he moves it unexpectedly.           [facility] OT Assessment - 05/11/16 0956      Assessment   Diagnosis Left shoulder RTC repair   Referring Provider Earlie Server, MD   Onset Date 05/06/16   Prior Therapy Pt received OT for RUE previously. None for LUE.      Precautions   Precautions Shoulder   Type of Shoulder Precautions Standard RCR protocol: Sling on 4-6 weeks. P/ROM for 4 weeks. Progress to AA/ROM until 8 weeks. Complete A/ROM at 8 weeks and progress to Strengthening.   Shoulder Interventions Shoulder sling/immobilizer;Off for dressing/bathing/exercises;At all times     Restrictions   Weight Bearing Restrictions Yes   LUE Weight Bearing Non weight bearing     Balance Screen   Has the patient fallen in the past 6 months Yes   How many times? 1   Has the patient had a decrease in activity level because of a fear of falling?  No   Is the patient reluctant to leave their home because of a fear of falling?  No     Home  Environment   Family/patient expects to be discharged to: Private residence   Living Arrangements Spouse/significant other     Prior Function   Level of [place] Retired     ADL   ADL comments pt is unable to complete any daily tasks with LUE at this time.      Mobility   Mobility Status Independent     Written Expression   Dominant Hand Right     Vision - History   Baseline Vision No visual deficits     Cognition   Overall Cognitive Status Within Functional Limits for tasks assessed     Edema   Edema Max amount of edema noted in left elbow region.     ROM / Strength   AROM / PROM / Strength AROM;PROM;Strength     AROM   Overall AROM  Unable to assess;Due to precautions   AROM Assessment Site Shoulder   Right/Left Shoulder Left     PROM   Overall PROM Comments Assessed supine. IR/er adducted.    PROM Assessment Site Shoulder   Right/Left Shoulder Left   Left Shoulder Flexion 65 Degrees   Left Shoulder ABduction 91 Degrees   Left Shoulder Internal Rotation 90 Degrees   Left Shoulder External Rotation 0 Degrees     Strength   Overall Strength Unable to assess;Due to precautions   Strength Assessment Site Shoulder   Right/Left Shoulder Left  OT Education - 05/11/16 1404    Education provided Yes   Education Details table slides, A/ROM elbow and wrist, pendulums   Person(s) Educated Patient   Methods Explanation;Demonstration;Verbal cues;Handout   Comprehension Returned demonstration;Verbalized understanding          OT Short Term Goals - 05/11/16 1424      OT SHORT TERM GOAL #1   Title Patient will be educated and independent with HEP to increase functional use of LUE during daily tasks.    Time 6   Period Weeks   Status New     OT SHORT TERM GOAL #2   Title Patient will increase P/ROM to WNL to increase ability to get shirts on and off easier.    Time 6   Period Weeks   Status New     OT SHORT TERM GOAL #3   Title Patient will decrease pain to 5/10 when completing daily tasks with LUE.    Time 6   Period Weeks   Status New     OT SHORT TERM GOAL #4   Title Patient will increase strength to 3+/5 to increase ability to complete daily tasks at waist level.   Time 6   Period Weeks   Status New     OT SHORT TERM GOAL #5   Title Patient will decrease fascial restrictions to Mod amount in LUE to increase functional mobility needed for reaching tasks.    Time 6   Period Weeks   Status New           OT Long Term Goals - 05/11/16 1449      OT LONG TERM GOAL #1   Title Patient will return to highest level of independence with all daily tasks using LUE.   Time 12   Period Weeks   Status New     OT LONG TERM GOAL #2   Title Patient will increase LUE strength to 4/5 to increase ability to lift normal household  items.   Time 12   Period Weeks   Status New     OT LONG TERM GOAL #3   Title Patient will increase RUE A/ROM to WNL to increase ability to complete reaching tasks overhead.   Time 12   Period Weeks   Status New     OT LONG TERM GOAL #4   Title Patient will decrease fascial restrictions to min amount or less to increase functional mobility needed for overhead reaching tasks.    Time 12   Period Weeks   Status New     OT LONG TERM GOAL #5   Title Patient will decrease pain level to 2/10 or less when completing daily tasks.   Time 12   Period Weeks   Status New               Plan - 05/11/16 1410    Clinical Impression Statement A: Pt is a 65 y/o male S/P left RTC repair causing increased pain, edema, fascial restrictions and decreased strength and ROM resulting in difficulty completing daily tasks with LUE.    Rehab Potential Excellent   OT Frequency 2x / week   OT Duration 12 weeks   OT Treatment/Interventions Self-care/ADL training;Therapeutic exercise;Dry needling;Patient/family education;Manual Therapy;Ultrasound;Cryotherapy;Electrical Stimulation;Moist Heat;Passive range of motion;Therapeutic activities;DME and/or AE instruction   Plan P: Pt will benefit from skilled OT services to increase funtional use of LUE during daily tasks. Treatment Plan: Follow RTC repair protocol. Complete myofascial release, manual stretching, P/ROM, AA/ROM, A/ROM,  and general shoulder and scapular strengthening.    OT Home Exercise Plan 12/4: table slides, pendulums, A/ROM elbow and wrist   Consulted and Agree with Plan of Care Patient      Patient will benefit from skilled therapeutic intervention in order to improve the following deficits and impairments:  Pain, Impaired UE functional use, Increased fascial restricitons, Decreased range of motion, Increased edema  Visit Diagnosis: Other symptoms and signs involving the musculoskeletal system - Plan: Ot plan of care cert/re-cert  Acute  pain of left shoulder - Plan: Ot plan of care cert/re-cert  Stiffness of left shoulder, not elsewhere classified - Plan: Ot plan of care cert/re-cert      G-Codes - [id number]    Functional Assessment Tool Used FOTO score: 4/100 (96% impaired)   Functional Limitation Carrying, moving and handling objects   Carrying, Moving and Handling Objects Current Status [id number]) At least 80 percent but less than 100 percent impaired, limited or restricted   Carrying, Moving and Handling Objects Goal Status [id number]) At least 20 percent but less than 40 percent impaired, limited or restricted      Problem List Patient Active Problem List   Diagnosis Date Noted  . Depression 03/10/2016  . Right arm pain 10/31/2014  . Overuse injury 10/31/2014  . Cervical disc disorder with radiculopathy of cervical region 10/31/2014  . IBS (irritable bowel syndrome) 10/19/2013  . Back pain 01/02/2013  . Hyperlipidemia LDL goal <130 09/20/2012  . Essential hypertension, benign 09/20/2012  . GERD (gastroesophageal reflux disease) 12/30/2011  . GRANULOMA 08/27/2009  . Dysphagia 06/17/2009  . PROSTATITIS, HX OF 06/17/2009   Ailene Ravel, OTR/L,CBIS  [phone]  05/11/2016, 2:59 PM  [place] [address] [place], [place], [address] Phone: [phone]   Fax:  [phone]  Name: Adam [name] MRN: [id number] Date of Birth: 07/08/1950

## 2016-05-11 NOTE — Patient Instructions (Signed)
TOWEL SLIDES COMPLETE FOR 1-3 MINUTES, 3-5 TIMES PER DAY  SHOULDER: Flexion On Table   Place hands on table, elbows straight. Move hips away from body. Press hands down into table. Hold ___ seconds. ___ reps per set, ___ sets per day, ___ days per week  Abduction (Passive)   With arm out to side, resting on table, lower head toward arm, keeping trunk away from table. Hold ____ seconds. Repeat ____ times. Do ____ sessions per day.  Copyright  VHI. All rights reserved.     Internal Rotation (Assistive)   Seated with elbow bent at right angle and held against side, slide arm on table surface in an inward arc. Repeat ____ times. Do ____ sessions per day. Activity: Use this motion to brush crumbs off the table.  Copyright  VHI. All rights reserved.    COMPLETE PENDULUM EXERCISES FOR 30 SECONDS TO A MINUTE EACH, 3-5 TIMES PER DAY. ROM: Pendulum (Side-to-Side)     http://orth.exer.us/792   Copyright  VHI. All rights reserved.  Pendulum Forward/Back   Bend forward 90 at waist, using table for support. Rock body forward and back to swing arm. Repeat ____ times. Do ____ sessions per day.  Copyright  VHI. All rights reserved.  Pendulum Circular   Bend forward 90 at waist, leaning on table for support. Rock body in a circular pattern to move arm clockwise ____ times then counterclockwise ____ times. Do ____ sessions per day.  Copyright  VHI. All rights reserved.  AROM: Wrist Extension   With right palm down, bend wrist up. Repeat 10____ times per set. Do ____ sets per session. Do __3__ sessions per day.  Copyright  VHI. All rights reserved.   AROM: Wrist Flexion   With right palm up, bend wrist up. Repeat ___10_ times per set. Do ____ sets per session. Do __3__ sessions per day.  Copyright  VHI. All rights reserved.   AROM: Forearm Pronation / Supination   With right arm in handshake position, slowly rotate palm down until stretch is felt. Relax. Then  rotate palm up until stretch is felt. Repeat __10__ times per set. Do ____ sets per session. Do __3__ sessions per day.  Copyright  VHI. All rights reserved.   AFlexion (Passive)   Use other hand to bend elbow, with thumb toward same shoulder. Do NOT force this motion. Hold ____ seconds. Repeat ____ times. Do ____ sessions per day.

## 2016-05-15 ENCOUNTER — Encounter (HOSPITAL_COMMUNITY): Payer: Self-pay

## 2016-05-15 ENCOUNTER — Ambulatory Visit (HOSPITAL_COMMUNITY): Payer: BLUE CROSS/BLUE SHIELD

## 2016-05-15 DIAGNOSIS — R29898 Other symptoms and signs involving the musculoskeletal system: Secondary | ICD-10-CM | POA: Diagnosis not present

## 2016-05-15 DIAGNOSIS — M25612 Stiffness of left shoulder, not elsewhere classified: Secondary | ICD-10-CM

## 2016-05-15 DIAGNOSIS — M25512 Pain in left shoulder: Secondary | ICD-10-CM

## 2016-05-15 NOTE — Therapy (Signed)
[place], [place], [address] Phone: [phone]   Fax:  [phone]  Occupational Therapy Treatment  Patient Details  Name: Adam [name] MRN: [id number] Date of Birth: 1950/06/28 Referring Provider: Earlie Server, MD  Encounter Date: 05/15/2016      OT End of Session - 05/15/16 1127    Visit Number 2   Number of Visits 24   Date for OT Re-Evaluation 07/10/16  mini ressess: 07/10/16   Authorization Type 1) Medicare 2) BSBC   Authorization Time Period before 10th visit   Authorization - Visit Number 2   Authorization - Number of Visits 10   OT Start Time 0900  Spoke to Dr. French Ana on phone 9:00-9:10   OT Stop Time 0945   OT Time Calculation (min) 45 min   Activity Tolerance Patient tolerated treatment well   Behavior During Therapy [facility] for tasks assessed/performed      Past Medical History:  Diagnosis Date  . Allergy   . BMI 31.0-31.9,adult JUNE 2011 180 LBS   . Chronic back pain   . Depression   . Eosinophilic granuloma ([place]) JAN 2011 GASTRIC, followed by Dr. Eugenia Pancoast   EGD JAN 2011, JUN 2011, NOV 2011  . Gastric ulcer 08/31/2012  . GERD (gastroesophageal reflux disease)   . HTN (hypertension)   . Hyperlipemia   . IBS (irritable bowel syndrome)   . Prostate hypertrophy     Past Surgical History:  Procedure Laterality Date  . BACK SURGERY    . COLONOSCOPY  JAN 2011 COMPLETE   hypoTN and bradycardia w/ Demerol ,Phenergan and Versed, [place] TICS, SML IH  . COLONOSCOPY  2003   NUR-normal  . COLONOSCOPY  06/21/09   Fields-sigmoid diverticulosis, sm int hemorrhoids  . COLONOSCOPY N/A 10/24/2013   Normal mucosa in the terminal ileum Moderate diverticulosis noted in the sigmoid colonModerate sized internal hemorrhoids. negative random colon bx.. next TCS 10/2023  . Cyst Removed     from Left leg and knee  . ESOPHAGOGASTRODUODENOSCOPY  05/20/2012   SLF: MILD ESOPHAGITIS.NO BARRETT'S/Multiple ulcers  ranging between 3-5 mm/MILD Duodenal inflammation was found in the duodenal bulb  . ESOPHAGOGASTRODUODENOSCOPY N/A 09/05/2012   SLF: MILD Non-erosive gastritis (inflammation) in the gastric antrum. Biopsies benign, no H. pylori.  . ESOPHAGOGASTRODUODENOSCOPY N/A 10/24/2013   BOF:BPZWCHEN DISTAL ESOPHAGEAL WEBSmall hiatal herniaMILD Non-erosive gastritis. SB bx benign. gastric bx, minimal inflammation.   Marland Kitchen GANGLION CYST EXCISION     from L wrist and pins placed for Fx involving L thumb  . HAND SURGERY  at the age of 67   Brother accidentally cut it  . MALONEY DILATION N/A 10/24/2013   Procedure: MALONEY DILATION;  Surgeon: Danie Binder, MD;  [facility];  Service: Endoscopy;  Laterality: N/A;  . MASS EXCISION  04/29/2012   Procedure: EXCISION MASS;  Surgeon: Donato Heinz, MD;  Location: [facility] ORS;  Service: General;  Laterality: Right;  Excision of Vascular Mass Right Arm  . SAVORY DILATION N/A 10/24/2013   Procedure: SAVORY DILATION;  Surgeon: Danie Binder, MD;  Location[facility];  Service: Endoscopy;  Laterality: N/A;  . TUMOR REMOVAL  1994   Benign [facility] brain at [facility]  . UPPER GASTROINTESTINAL ENDOSCOPY  JAN 2011   GASTRIC ULCER-EOS GRANULOMA  . UPPER GASTROINTESTINAL ENDOSCOPY  JUN 2011   GASTRIC ULCER- EOS, NO H. PYLORI  . UPPER GASTROINTESTINAL ENDOSCOPY  NOV 2011   GASTRIC NODULE, no ulcer-  EOS    There were no vitals filed for this visit.      Subjective Assessment - 05/15/16 0946    Subjective  S: I don't have any pain when it's just sitting in the sling.   Currently in Pain? No/denies            [facility] OT Assessment - 05/15/16 1101      Assessment   Diagnosis Left shoulder RTC repair     Precautions   Precautions Shoulder   Type of Shoulder Precautions Non-agressive RTC Repair Protocol. Per Dr. French Ana No abduction for 6 weeks. Week 0-5 (06/10/16): P/ROM within following limits: Flexion (no limits), er 0-30, IR (no limits). pendulums,  wrist A/ROM, scapular A/ROM. In 2 weeks (05/20/16) Begin glides and scapular distraction. Week 5-6 (06/17/16): P/ROM to following limits: er (0-45); Abduction A/ROM prone shoulder extension to neutral, start isometric IR and extension. Week 6-7 (06/24/16): ROM limits: er (0-80) and Abduction (80 degrees). All isometrics, scapular strengthening with theraband. Week 7-8 (07/01/16): No limits for ROM. AA/ROM exercises standing. Week 8-9 (07/08/16): UBE. Prone PREs. A/ROM exercies. Week 9+: strengthening exercises.    Shoulder Interventions Shoulder sling/immobilizer;Off for dressing/bathing/exercises;At all times                  OT Treatments/Exercises (OP) - 05/15/16 1101      Exercises   Exercises Shoulder     Shoulder Exercises: Supine   Protraction PROM;10 reps   Horizontal ABduction PROM;5 reps  Adduction only.   External Rotation PROM;10 reps   Internal Rotation PROM;10 reps   Flexion PROM;10 reps   ABduction --  No abduction per protocol.     Shoulder Exercises: Seated   Elevation AROM;10 reps   Extension AROM;10 reps   Row AROM;10 reps     Shoulder Exercises: ROM/Strengthening   Thumb Tacks 1' low level   Prot/Ret//Elev/Dep 1' low level     Manual Therapy   Manual Therapy Myofascial release   Manual therapy comments Manual therapy completed prior to exercises   Myofascial Release myofascial release and manual stretching completed to left upper arm, trapeziu, and scapularis region to decrease fascial restrctions and increase joint                 OT Education - 05/15/16 1130    Education provided Yes   Education Details Pt was educated on conversation with Dr. French Ana. Instructed patient to refrain from completing abduction with table slides at this time.   Person(s) Educated Patient   Methods Explanation   Comprehension Verbalized understanding          OT Short Term Goals - 05/11/16 1424      OT SHORT TERM GOAL #1   Title Patient will be educated  and independent with HEP to increase functional use of LUE during daily tasks.    Time 6   Period Weeks   Status New     OT SHORT TERM GOAL #2   Title Patient will increase P/ROM to WNL to increase ability to get shirts on and off easier.    Time 6   Period Weeks   Status New     OT SHORT TERM GOAL #3   Title Patient will decrease pain to 5/10 when completing daily tasks with LUE.    Time 6   Period Weeks   Status New     OT SHORT TERM GOAL #4   Title Patient will increase strength to 3+/5 to increase ability to  complete daily tasks at waist level.   Time 6   Period Weeks   Status New     OT SHORT TERM GOAL #5   Title Patient will decrease fascial restrictions to Mod amount in LUE to increase functional mobility needed for reaching tasks.    Time 6   Period Weeks   Status New           OT Long Term Goals - 05/11/16 1449      OT LONG TERM GOAL #1   Title Patient will return to highest level of independence with all daily tasks using LUE.   Time 12   Period Weeks   Status New     OT LONG TERM GOAL #2   Title Patient will increase LUE strength to 4/5 to increase ability to lift normal household items.   Time 12   Period Weeks   Status New     OT LONG TERM GOAL #3   Title Patient will increase RUE A/ROM to WNL to increase ability to complete reaching tasks overhead.   Time 12   Period Weeks   Status New     OT LONG TERM GOAL #4   Title Patient will decrease fascial restrictions to min amount or less to increase functional mobility needed for overhead reaching tasks.    Time 12   Period Weeks   Status New     OT LONG TERM GOAL #5   Title Patient will decrease pain level to 2/10 or less when completing daily tasks.   Time 12   Period Weeks   Status New               Plan - 05/15/16 1128    Clinical Impression Statement A: Spoke with Dr. French Ana on the phone who requested that we complete a non-agressive RTC repair protocol. No abduction for 6  weeks. Patient to stay immobile for 6 weeks in sling as well. Tear and repair was very extensive.    Plan P: Continue to follow protocol and exercises that were completed at previous session.       Patient will benefit from skilled therapeutic intervention in order to improve the following deficits and impairments:  Pain, Impaired UE functional use, Increased fascial restricitons, Decreased range of motion, Increased edema  Visit Diagnosis: Other symptoms and signs involving the musculoskeletal system  Acute pain of left shoulder  Stiffness of left shoulder, not elsewhere classified    Problem List Patient Active Problem List   Diagnosis Date Noted  . Depression 03/10/2016  . Right arm pain 10/31/2014  . Overuse injury 10/31/2014  . Cervical disc disorder with radiculopathy of cervical region 10/31/2014  . IBS (irritable bowel syndrome) 10/19/2013  . Back pain 01/02/2013  . Hyperlipidemia LDL goal <130 09/20/2012  . Essential hypertension, benign 09/20/2012  . GERD (gastroesophageal reflux disease) 12/30/2011  . GRANULOMA 08/27/2009  . Dysphagia 06/17/2009  . Hope Budds OF 06/17/2009   Ailene Ravel, OTR/L,CBIS  [phone]  05/15/2016, 12:36 PM  [facility] [address] [place], [place], [address] Phone: [phone]   Fax:  [phone]  Name: Adam [name] MRN: [id number] Date of Birth: 07-Nov-1950

## 2016-05-18 ENCOUNTER — Ambulatory Visit (HOSPITAL_COMMUNITY): Payer: BLUE CROSS/BLUE SHIELD

## 2016-05-18 ENCOUNTER — Encounter (HOSPITAL_COMMUNITY): Payer: Self-pay

## 2016-05-18 DIAGNOSIS — M25612 Stiffness of left shoulder, not elsewhere classified: Secondary | ICD-10-CM

## 2016-05-18 DIAGNOSIS — M25512 Pain in left shoulder: Secondary | ICD-10-CM | POA: Diagnosis not present

## 2016-05-18 DIAGNOSIS — R29898 Other symptoms and signs involving the musculoskeletal system: Secondary | ICD-10-CM | POA: Diagnosis not present

## 2016-05-18 NOTE — Therapy (Signed)
[place], [place], [address] Phone: [phone]   Fax:  [phone]  Occupational Therapy Treatment  Patient Details  Name: Adam [name] MRN: [id number] Date of Birth: 08-Jan-1951 Referring Provider: Earlie Server, MD  Encounter Date: 05/18/2016      OT End of Session - 05/18/16 1133    Visit Number 3   Number of Visits 24   Date for OT Re-Evaluation 07/29/16  mini ressess: 07/10/16   Authorization Type 1) Medicare 2) BSBC   Authorization Time Period before 10th visit   Authorization - Visit Number 3   Authorization - Number of Visits 10   OT Start Time 1038   OT Stop Time 1102   OT Time Calculation (min) 24 min   Activity Tolerance Patient tolerated treatment well   Behavior During Therapy [facility] for tasks assessed/performed      Past Medical History:  Diagnosis Date  . Allergy   . BMI 31.0-31.9,adult JUNE 2011 180 LBS   . Chronic back pain   . Depression   . Eosinophilic granuloma ([place]) JAN 2011 GASTRIC, followed by Dr. Eugenia Pancoast   EGD JAN 2011, JUN 2011, NOV 2011  . Gastric ulcer 08/31/2012  . GERD (gastroesophageal reflux disease)   . HTN (hypertension)   . Hyperlipemia   . IBS (irritable bowel syndrome)   . Prostate hypertrophy     Past Surgical History:  Procedure Laterality Date  . BACK SURGERY    . COLONOSCOPY  JAN 2011 COMPLETE   hypoTN and bradycardia w/ Demerol ,Phenergan and Versed, [place] TICS, SML IH  . COLONOSCOPY  2003   NUR-normal  . COLONOSCOPY  06/21/09   Fields-sigmoid diverticulosis, sm int hemorrhoids  . COLONOSCOPY N/A 10/24/2013   Normal mucosa in the terminal ileum Moderate diverticulosis noted in the sigmoid colonModerate sized internal hemorrhoids. negative random colon bx.. next TCS 10/2023  . Cyst Removed     from Left leg and knee  . ESOPHAGOGASTRODUODENOSCOPY  05/20/2012   SLF: MILD ESOPHAGITIS.NO BARRETT'S/Multiple ulcers ranging between 3-5 mm/MILD Duodenal  inflammation was found in the duodenal bulb  . ESOPHAGOGASTRODUODENOSCOPY N/A 09/05/2012   SLF: MILD Non-erosive gastritis (inflammation) in the gastric antrum. Biopsies benign, no H. pylori.  . ESOPHAGOGASTRODUODENOSCOPY N/A 10/24/2013   BVQ:XIHWTUUE DISTAL ESOPHAGEAL WEBSmall hiatal herniaMILD Non-erosive gastritis. SB bx benign. gastric bx, minimal inflammation.   Marland Kitchen GANGLION CYST EXCISION     from L wrist and pins placed for Fx involving L thumb  . HAND SURGERY  at the age of 69   Brother accidentally cut it  . MALONEY DILATION N/A 10/24/2013   Procedure: MALONEY DILATION;  Surgeon: Danie Binder, MD;  [facility];  Service: Endoscopy;  Laterality: N/A;  . MASS EXCISION  04/29/2012   Procedure: EXCISION MASS;  Surgeon: Donato Heinz, MD;  Location: [facility];  Service: General;  Laterality: Right;  Excision of Vascular Mass Right Arm  . SAVORY DILATION N/A 10/24/2013   Procedure: SAVORY DILATION;  Surgeon: Danie Binder, MD;  [facility];  Service: Endoscopy;  Laterality: N/A;  . TUMOR REMOVAL  1994   Benign [facility]  . UPPER GASTROINTESTINAL ENDOSCOPY  JAN 2011   GASTRIC ULCER-EOS GRANULOMA  . UPPER GASTROINTESTINAL ENDOSCOPY  JUN 2011   GASTRIC ULCER- EOS, NO H. PYLORI  . UPPER GASTROINTESTINAL ENDOSCOPY  NOV 2011   GASTRIC NODULE, no ulcer- EOS    There were no vitals  filed for this visit.      Subjective Assessment - 05/18/16 1132    Subjective  S: I can' t go walking around my garden until the ice melts. I'm afraid I'll slip and fall.    Currently in Pain? No/denies            [facility] OT Assessment - 05/18/16 1052      Assessment   Diagnosis Left shoulder RTC repair     Precautions   Precautions Shoulder   Type of Shoulder Precautions Non-agressive RTC Repair Protocol. Per Dr. French Ana No abduction for 6 weeks. Week 0-5 (06/10/16): P/ROM within following limits: Flexion (no limits), er 0-30, IR (no limits). pendulums, wrist  A/ROM, scapular A/ROM. In 2 weeks (05/20/16) Begin glides and scapular distraction. Week 5-6 (06/17/16): P/ROM to following limits: er (0-45); Abduction A/ROM prone shoulder extension to neutral, start isometric IR and extension. Week 6-7 (06/24/16): ROM limits: er (0-80) and Abduction (80 degrees). All isometrics, scapular strengthening with theraband. Week 7-8 (07/01/16): No limits for ROM. AA/ROM exercises standing. Week 8-9 (07/08/16): UBE. Prone PREs. A/ROM exercies. Week 9+: strengthening exercises.    Shoulder Interventions Shoulder sling/immobilizer;Off for dressing/bathing/exercises;At all times                  OT Treatments/Exercises (OP) - 05/18/16 1050      Exercises   Exercises Shoulder     Shoulder Exercises: Supine   Protraction PROM;10 reps   Horizontal ABduction PROM;10 reps  adduction only   External Rotation PROM;10 reps   Internal Rotation PROM;10 reps   Flexion PROM;10 reps   ABduction --  No abduction per protocol     Shoulder Exercises: Seated   Elevation AROM;10 reps   Extension AROM;10 reps   Row AROM;10 reps   Other Seated Exercises Elbow flexion/extension 10X   Other Seated Exercises Supination/pronation elbow; 10X     Shoulder Exercises: Therapy Ball   Flexion 10 reps     Shoulder Exercises: ROM/Strengthening   Thumb Tacks 1' low level   Prot/Ret//Elev/Dep 1' low level     Manual Therapy   Manual Therapy Myofascial release   Manual therapy comments Manual therapy completed prior to exercises   Myofascial Release myofascial release and manual stretching completed to left upper arm, trapeziu, and scapularis region to decrease fascial restrctions and increase joint                   OT Short Term Goals - 05/11/16 1424      OT SHORT TERM GOAL #1   Title Patient will be educated and independent with HEP to increase functional use of LUE during daily tasks.    Time 6   Period Weeks   Status New     OT SHORT TERM GOAL #2   Title  Patient will increase P/ROM to WNL to increase ability to get shirts on and off easier.    Time 6   Period Weeks   Status New     OT SHORT TERM GOAL #3   Title Patient will decrease pain to 5/10 when completing daily tasks with LUE.    Time 6   Period Weeks   Status New     OT SHORT TERM GOAL #4   Title Patient will increase strength to 3+/5 to increase ability to complete daily tasks at waist level.   Time 6   Period Weeks   Status New     OT SHORT TERM GOAL #5  Title Patient will decrease fascial restrictions to Mod amount in LUE to increase functional mobility needed for reaching tasks.    Time 6   Period Weeks   Status New           OT Long Term Goals - 05/11/16 1449      OT LONG TERM GOAL #1   Title Patient will return to highest level of independence with all daily tasks using LUE.   Time 12   Period Weeks   Status New     OT LONG TERM GOAL #2   Title Patient will increase LUE strength to 4/5 to increase ability to lift normal household items.   Time 12   Period Weeks   Status New     OT LONG TERM GOAL #3   Title Patient will increase RUE A/ROM to WNL to increase ability to complete reaching tasks overhead.   Time 12   Period Weeks   Status New     OT LONG TERM GOAL #4   Title Patient will decrease fascial restrictions to min amount or less to increase functional mobility needed for overhead reaching tasks.    Time 12   Period Weeks   Status New     OT LONG TERM GOAL #5   Title Patient will decrease pain level to 2/10 or less when completing daily tasks.   Time 12   Period Weeks   Status New               Plan - 05/18/16 1134    Clinical Impression Statement A: Continued following protocol per MD. VC were needed for form and technique. patient was able to tolerate passive flexion further than previous session.   Plan P: Add anterior and caudal glide. Add distraction stretching.      Patient will benefit from skilled therapeutic  intervention in order to improve the following deficits and impairments:  Pain, Impaired UE functional use, Increased fascial restricitons, Decreased range of motion, Increased edema  Visit Diagnosis: Other symptoms and signs involving the musculoskeletal system  Stiffness of left shoulder, not elsewhere classified    Problem List Patient Active Problem List   Diagnosis Date Noted  . Depression 03/10/2016  . Right arm pain 10/31/2014  . Overuse injury 10/31/2014  . Cervical disc disorder with radiculopathy of cervical region 10/31/2014  . IBS (irritable bowel syndrome) 10/19/2013  . Back pain 01/02/2013  . Hyperlipidemia LDL goal <130 09/20/2012  . Essential hypertension, benign 09/20/2012  . GERD (gastroesophageal reflux disease) 12/30/2011  . GRANULOMA 08/27/2009  . Dysphagia 06/17/2009  . PROSTATITIS, HX OF 06/17/2009   Ailene Ravel, OTR/L,CBIS  [phone]  05/18/2016, 11:38 AM  [facility] [address] [place], [place], [address] Phone: [phone]   Fax:  [phone]  Name: Adam [name] MRN: [id number] Date of Birth: Jul 10, 1950

## 2016-05-21 ENCOUNTER — Encounter (HOSPITAL_COMMUNITY): Payer: Self-pay | Admitting: Occupational Therapy

## 2016-05-21 ENCOUNTER — Ambulatory Visit (HOSPITAL_COMMUNITY): Payer: BLUE CROSS/BLUE SHIELD | Admitting: Occupational Therapy

## 2016-05-21 DIAGNOSIS — M25512 Pain in left shoulder: Secondary | ICD-10-CM

## 2016-05-21 DIAGNOSIS — R29898 Other symptoms and signs involving the musculoskeletal system: Secondary | ICD-10-CM | POA: Diagnosis not present

## 2016-05-21 DIAGNOSIS — M25612 Stiffness of left shoulder, not elsewhere classified: Secondary | ICD-10-CM | POA: Diagnosis not present

## 2016-05-21 NOTE — Therapy (Signed)
[facility] [address] [place], , [address] Phone: [phone]   Fax:  [phone]  Occupational Therapy Treatment  Patient Details  Name: Adam [name] MRN: [id number] Date of Birth: 12/21/1950 Referring Provider: Daniel Caffrey, MD  Encounter Date: 05/21/2016      OT End of Session - 05/21/16 1059    Visit Number 4   Number of Visits 24   Date for OT Re-Evaluation 07/29/16  mini ressess: 07/10/16   Authorization Type 1) Medicare 2) BSBC   Authorization Time Period before 10th visit   Authorization - Visit Number 4   Authorization - Number of Visits 10   OT Start Time 0947   OT Stop Time 1030   OT Time Calculation (min) 43 min   Activity Tolerance Patient tolerated treatment well   Behavior During Therapy WFL for tasks assessed/performed      Past Medical History:  Diagnosis Date  . Allergy   . BMI 31.0-31.9,adult JUNE 2011 180 LBS   . Chronic back pain   . Depression   . Eosinophilic granuloma (HCC) JAN 2011 GASTRIC, followed by Dr. Howerton   EGD JAN 2011, JUN 2011, NOV 2011  . Gastric ulcer 08/31/2012  . GERD (gastroesophageal reflux disease)   . HTN (hypertension)   . Hyperlipemia   . IBS (irritable bowel syndrome)   . Prostate hypertrophy     Past Surgical History:  Procedure Laterality Date  . BACK SURGERY    . COLONOSCOPY  JAN 2011 COMPLETE   hypoTN and bradycardia w/ Demerol ,Phenergan and Versed, Stacy TICS, SML IH  . COLONOSCOPY  2003   NUR-normal  . COLONOSCOPY  06/21/09   Fields-sigmoid diverticulosis, sm int hemorrhoids  . COLONOSCOPY N/A 10/24/2013   Normal mucosa in the terminal ileum Moderate diverticulosis noted in the sigmoid colonModerate sized internal hemorrhoids. negative random colon bx.. next TCS 10/2023  . Cyst Removed     from Left leg and knee  . ESOPHAGOGASTRODUODENOSCOPY  05/20/2012   SLF: MILD ESOPHAGITIS.NO BARRETT'S/Multiple ulcers ranging between 3-5 mm/MILD Duodenal  inflammation was found in the duodenal bulb  . ESOPHAGOGASTRODUODENOSCOPY N/A 09/05/2012   SLF: MILD Non-erosive gastritis (inflammation) in the gastric antrum. Biopsies benign, no H. pylori.  . ESOPHAGOGASTRODUODENOSCOPY N/A 10/24/2013   SLF:POSSIBLE DISTAL ESOPHAGEAL WEBSmall hiatal herniaMILD Non-erosive gastritis. SB bx benign. gastric bx, minimal inflammation.   . GANGLION CYST EXCISION     from L wrist and pins placed for Fx involving L thumb  . HAND SURGERY  at the age of 3   Brother accidentally cut it  . MALONEY DILATION N/A 10/24/2013   Procedure: MALONEY DILATION;  Surgeon: [name], MD;  [facility];  Service: Endoscopy;  Laterality: N/A;  . MASS EXCISION  04/29/2012   Procedure: EXCISION MASS;  Surgeon: Brent C Ziegler, MD;  Location: [facility];  Service: General;  Laterality: Right;  Excision of Vascular Mass Right Arm  . SAVORY DILATION N/A 10/24/2013   Procedure: SAVORY DILATION;  Surgeon: [name], MD;  [facility]: [facility];  Service: Endoscopy;  Laterality: N/A;  . TUMOR REMOVAL  1994   Benign from brain at [facility]  . UPPER GASTROINTESTINAL ENDOSCOPY  JAN 2011   GASTRIC ULCER-EOS GRANULOMA  . UPPER GASTROINTESTINAL ENDOSCOPY  JUN 2011   GASTRIC ULCER- EOS, NO H. PYLORI  . UPPER GASTROINTESTINAL ENDOSCOPY  NOV 2011   GASTRIC NODULE, no ulcer- EOS    There were no vitals   filed for this visit.      Subjective Assessment - 05/21/16 0946    Subjective  S:    Currently in Pain? No/denies            [facility] OT Assessment - 05/21/16 0946      Assessment   Diagnosis Left shoulder RTC repair     Precautions   Precautions Shoulder   Type of Shoulder Precautions Non-agressive RTC Repair Protocol. Per Dr. Caffrey No abduction for 6 weeks. Week 0-5 (06/10/16): P/ROM within following limits: Flexion (no limits), er 0-30, IR (no limits). pendulums, wrist A/ROM, scapular A/ROM. In 2 weeks (05/20/16) Begin glides and scapular distraction. Week 5-6  (06/17/16): P/ROM to following limits: er (0-45); Abduction A/ROM prone shoulder extension to neutral, start isometric IR and extension. Week 6-7 (06/24/16): ROM limits: er (0-80) and Abduction (80 degrees). All isometrics, scapular strengthening with theraband. Week 7-8 (07/01/16): No limits for ROM. AA/ROM exercises standing. Week 8-9 (07/08/16): UBE. Prone PREs. A/ROM exercies. Week 9+: strengthening exercises.    Shoulder Interventions Shoulder sling/immobilizer;Off for dressing/bathing/exercises;At all times                  OT Treatments/Exercises (OP) - 05/21/16 0949      Exercises   Exercises Shoulder     Shoulder Exercises: Supine   Protraction PROM;10 reps   Horizontal ABduction PROM;10 reps  adduction   External Rotation PROM;10 reps   Internal Rotation PROM;10 reps   Flexion PROM;10 reps   ABduction --  no abduction per protocol     Shoulder Exercises: Seated   Elevation AROM;15 reps   Extension AROM;15 reps   Row AROM;15 reps   Other Seated Exercises Elbow flexion/extension 10X   Other Seated Exercises Supination/pronation elbow; 10X     Shoulder Exercises: Therapy Ball   Flexion 10 reps     Shoulder Exercises: ROM/Strengthening   Thumb Tacks 1' low level   Anterior Glide 3x5"   Caudal Glide 3x5"   Prot/Ret//Elev/Dep 1' low level     Manual Therapy   Manual Therapy Myofascial release   Manual therapy comments Manual therapy completed prior to exercises   Myofascial Release myofascial release and manual stretching completed to left upper arm, trapeziu, and scapularis region to decrease fascial restrctions and increase joint                   OT Short Term Goals - 05/11/16 1424      OT SHORT TERM GOAL #1   Title Patient will be educated and independent with HEP to increase functional use of LUE during daily tasks.    Time 6   Period Weeks   Status New     OT SHORT TERM GOAL #2   Title Patient will increase P/ROM to WNL to increase ability  to get shirts on and off easier.    Time 6   Period Weeks   Status New     OT SHORT TERM GOAL #3   Title Patient will decrease pain to 5/10 when completing daily tasks with LUE.    Time 6   Period Weeks   Status New     OT SHORT TERM GOAL #4   Title Patient will increase strength to 3+/5 to increase ability to complete daily tasks at waist level.   Time 6   Period Weeks   Status New     OT SHORT TERM GOAL #5   Title Patient will decrease fascial restrictions to Mod amount   in LUE to increase functional mobility needed for reaching tasks.    Time 6   Period Weeks   Status New           OT Long Term Goals - 05/11/16 1449      OT LONG TERM GOAL #1   Title Patient will return to highest level of independence with all daily tasks using LUE.   Time 12   Period Weeks   Status New     OT LONG TERM GOAL #2   Title Patient will increase LUE strength to 4/5 to increase ability to lift normal household items.   Time 12   Period Weeks   Status New     OT LONG TERM GOAL #3   Title Patient will increase RUE A/ROM to WNL to increase ability to complete reaching tasks overhead.   Time 12   Period Weeks   Status New     OT LONG TERM GOAL #4   Title Patient will decrease fascial restrictions to min amount or less to increase functional mobility needed for overhead reaching tasks.    Time 12   Period Weeks   Status New     OT LONG TERM GOAL #5   Title Patient will decrease pain level to 2/10 or less when completing daily tasks.   Time 12   Period Weeks   Status New               Plan - 05/21/16 1059    Clinical Impression Statement A: Continued with protocol from MD. Added anterior and caudal shoulder glides this session. Increased repetitions for scapular A/ROM. Pt required intermittent cuing for technique with new glides. Pt reports HEP is going well, no increased pain at end of session.    Plan P: Add distraction stretching. Continue to follow protocol    OT Home  Exercise Plan 12/4: table slides, pendulums, A/ROM elbow and wrist   Consulted and Agree with Plan of Care Patient      Patient will benefit from skilled therapeutic intervention in order to improve the following deficits and impairments:  Pain, Impaired UE functional use, Increased fascial restricitons, Decreased range of motion, Increased edema  Visit Diagnosis: Other symptoms and signs involving the musculoskeletal system  Stiffness of left shoulder, not elsewhere classified  Acute pain of left shoulder    Problem List Patient Active Problem List   Diagnosis Date Noted  . Depression 03/10/2016  . Right arm pain 10/31/2014  . Overuse injury 10/31/2014  . Cervical disc disorder with radiculopathy of cervical region 10/31/2014  . IBS (irritable bowel syndrome) 10/19/2013  . Back pain 01/02/2013  . Hyperlipidemia LDL goal <130 09/20/2012  . Essential hypertension, benign 09/20/2012  . GERD (gastroesophageal reflux disease) 12/30/2011  . GRANULOMA 08/27/2009  . Dysphagia 06/17/2009  . PROSTATITIS, HX OF 06/17/2009   Leslie Troxler, OTR/L  [phone] 05/21/2016, 11:02 AM  [facility] [address] [place], [place], [address] Phone: [phone]   Fax:  [phone]  Name: Adam [name] MRN: [id number] Date of Birth: 03/14/1951  

## 2016-05-26 ENCOUNTER — Encounter (HOSPITAL_COMMUNITY): Payer: Self-pay

## 2016-05-26 ENCOUNTER — Ambulatory Visit (HOSPITAL_COMMUNITY): Payer: BLUE CROSS/BLUE SHIELD

## 2016-05-26 DIAGNOSIS — M25512 Pain in left shoulder: Secondary | ICD-10-CM

## 2016-05-26 DIAGNOSIS — R29898 Other symptoms and signs involving the musculoskeletal system: Secondary | ICD-10-CM | POA: Diagnosis not present

## 2016-05-26 DIAGNOSIS — M25612 Stiffness of left shoulder, not elsewhere classified: Secondary | ICD-10-CM

## 2016-05-26 NOTE — Therapy (Signed)
[place], [place], [address] Phone: [phone]   Fax:  [phone]  Occupational Therapy Treatment  Patient Details  Name: Adam [name] MRN: [id number] Date of Birth: Sep 13, 1950 Referring Provider: Earlie Server, MD  Encounter Date: 05/26/2016      OT End of Session - 05/26/16 1023    Visit Number 5   Number of Visits 24   Date for OT Re-Evaluation 07/29/16  mini ressess: 07/10/16   Authorization Type 1) Medicare 2) BSBC   Authorization Time Period before 10th visit   Authorization - Visit Number 5   Authorization - Number of Visits 10   OT Start Time 0945   OT Stop Time 1016   OT Time Calculation (min) 31 min   Activity Tolerance Patient tolerated treatment well   Behavior During Therapy [facility] for tasks assessed/performed      Past Medical History:  Diagnosis Date  . Allergy   . BMI 31.0-31.9,adult JUNE 2011 180 LBS   . Chronic back pain   . Depression   . Eosinophilic granuloma ([place]) JAN 2011 GASTRIC, followed by Dr. Eugenia Pancoast   EGD JAN 2011, JUN 2011, NOV 2011  . Gastric ulcer 08/31/2012  . GERD (gastroesophageal reflux disease)   . HTN (hypertension)   . Hyperlipemia   . IBS (irritable bowel syndrome)   . Prostate hypertrophy     Past Surgical History:  Procedure Laterality Date  . BACK SURGERY    . COLONOSCOPY  JAN 2011 COMPLETE   hypoTN and bradycardia w/ Demerol ,Phenergan and Versed,  TICS, SML IH  . COLONOSCOPY  2003   NUR-normal  . COLONOSCOPY  06/21/09   Fields-sigmoid diverticulosis, sm int hemorrhoids  . COLONOSCOPY N/A 10/24/2013   Normal mucosa in the terminal ileum Moderate diverticulosis noted in the sigmoid colonModerate sized internal hemorrhoids. negative random colon bx.. next TCS 10/2023  . Cyst Removed     from Left leg and knee  . ESOPHAGOGASTRODUODENOSCOPY  05/20/2012   SLF: MILD ESOPHAGITIS.NO BARRETT'S/Multiple ulcers ranging between 3-5 mm/MILD Duodenal  inflammation was found in the duodenal bulb  . ESOPHAGOGASTRODUODENOSCOPY N/A 09/05/2012   SLF: MILD Non-erosive gastritis (inflammation) in the gastric antrum. Biopsies benign, no H. pylori.  . ESOPHAGOGASTRODUODENOSCOPY N/A 10/24/2013   FVC:[id number] DISTAL ESOPHAGEAL WEBSmall hiatal herniaMILD Non-erosive gastritis. SB bx benign. gastric bx, minimal inflammation.   Marland Kitchen GANGLION CYST EXCISION     from L wrist and pins placed for Fx involving L thumb  . HAND SURGERY  at the age of 1   Brother accidentally cut it  . MALONEY DILATION N/A 10/24/2013   Procedure: MALONEY DILATION;  Surgeon: Danie Binder, MD;  [facility];  Service: Endoscopy;  Laterality: N/A;  . MASS EXCISION  04/29/2012   Procedure: EXCISION MASS;  Surgeon: Donato Heinz, MD;  Location: [facility];  Service: General;  Laterality: Right;  Excision of Vascular Mass Right Arm  . SAVORY DILATION N/A 10/24/2013   Procedure: SAVORY DILATION;  Surgeon: Danie Binder, MD;  [facility];  Service: Endoscopy;  Laterality: N/A;  . TUMOR REMOVAL  1994   Benign [facility] brain at [facility]  . UPPER GASTROINTESTINAL ENDOSCOPY  JAN 2011   GASTRIC ULCER-EOS GRANULOMA  . UPPER GASTROINTESTINAL ENDOSCOPY  JUN 2011   GASTRIC ULCER- EOS, NO H. PYLORI  . UPPER GASTROINTESTINAL ENDOSCOPY  NOV 2011   GASTRIC NODULE, no ulcer- EOS    There were no vitals  filed for this visit.      Subjective Assessment - 05/26/16 1004    Subjective  S: The only time I take any pain meds is when I come here.   Currently in Pain? No/denies            [facility] OT Assessment - 05/26/16 1004      Assessment   Diagnosis Left shoulder RTC repair     Precautions   Precautions Shoulder   Type of Shoulder Precautions Non-agressive RTC Repair Protocol. Per Dr. French Ana No abduction for 6 weeks. Week 0-5 (06/10/16): P/ROM within following limits: Flexion (no limits), er 0-30, IR (no limits). pendulums, wrist A/ROM, scapular A/ROM. In 2 weeks  (05/20/16) Begin glides and scapular distraction. Week 5-6 (06/17/16): P/ROM to following limits: er (0-45); Abduction A/ROM prone shoulder extension to neutral, start isometric IR and extension. Week 6-7 (06/24/16): ROM limits: er (0-80) and Abduction (80 degrees). All isometrics, scapular strengthening with theraband. Week 7-8 (07/01/16): No limits for ROM. AA/ROM exercises standing. Week 8-9 (07/08/16): UBE. Prone PREs. A/ROM exercies. Week 9+: strengthening exercises.    Shoulder Interventions Shoulder sling/immobilizer;Off for dressing/bathing/exercises;At all times                  OT Treatments/Exercises (OP) - 05/26/16 1005      Exercises   Exercises Shoulder     Shoulder Exercises: Supine   Protraction PROM;10 reps   Horizontal ABduction PROM;10 reps  adduction   External Rotation PROM;10 reps   Internal Rotation PROM;10 reps   Flexion PROM;10 reps   ABduction --  no abduction per MD     Shoulder Exercises: Seated   Elevation AROM;15 reps   Extension AROM;15 reps   Row AROM;15 reps   Other Seated Exercises Elbow flexion/extension 10X   Other Seated Exercises Supination/pronation elbow; 10X     Shoulder Exercises: Therapy Ball   Flexion 15 reps   Other Therapy Ball Exercises Bent over horizontal adduction with large blue therapy ball; 10X     Shoulder Exercises: ROM/Strengthening   Thumb Tacks 1' low level   Anterior Glide 3x10"   Caudal Glide 3x10"   Prot/Ret//Elev/Dep 1' low level     Shoulder Exercises: Stretch   Other Shoulder Stretches Shoulder distraction stretch; seated; 3x10"     Manual Therapy   Manual Therapy Myofascial release   Manual therapy comments Manual therapy completed prior to exercises   Myofascial Release myofascial release and manual stretching completed to left upper arm, trapeziu, and scapularis region to decrease fascial restrctions and increase joint                   OT Short Term Goals - 05/26/16 1025      OT SHORT  TERM GOAL #1   Title Patient will be educated and independent with HEP to increase functional use of LUE during daily tasks.    Time 6   Period Weeks   Status On-going     OT SHORT TERM GOAL #2   Title Patient will increase P/ROM to WNL to increase ability to get shirts on and off easier.    Time 6   Period Weeks   Status On-going     OT SHORT TERM GOAL #3   Title Patient will decrease pain to 5/10 when completing daily tasks with LUE.    Time 6   Period Weeks   Status On-going     OT SHORT TERM GOAL #4   Title Patient will increase  strength to 3+/5 to increase ability to complete daily tasks at waist level.   Time 6   Period Weeks   Status On-going     OT SHORT TERM GOAL #5   Title Patient will decrease fascial restrictions to Mod amount in LUE to increase functional mobility needed for reaching tasks.    Time 6   Period Weeks   Status On-going           OT Long Term Goals - 05/26/16 1025      OT LONG TERM GOAL #1   Title Patient will return to highest level of independence with all daily tasks using LUE.   Time 12   Period Weeks   Status On-going     OT LONG TERM GOAL #2   Title Patient will increase LUE strength to 4/5 to increase ability to lift normal household items.   Time 12   Period Weeks   Status On-going     OT LONG TERM GOAL #3   Title Patient will increase RUE A/ROM to WNL to increase ability to complete reaching tasks overhead.   Time 12   Period Weeks   Status On-going     OT LONG TERM GOAL #4   Title Patient will decrease fascial restrictions to min amount or less to increase functional mobility needed for overhead reaching tasks.    Time 12   Period Weeks   Status On-going     OT LONG TERM GOAL #5   Title Patient will decrease pain level to 2/10 or less when completing daily tasks.   Time 12   Period Weeks   Status On-going               Plan - 05/26/16 1023    Clinical Impression Statement A: Added shoulder distraction  stretch this session. Only reports of pain are during pro/ret/elev/dep in which patient states it's painful to retract with his elbows bent at 90 degrees.   Plan P: Continue to follow MD's protcol. patient is to find out time of MD appointment on 1/4 in case his OT appointment needs to be moved.      Patient will benefit from skilled therapeutic intervention in order to improve the following deficits and impairments:  Pain, Impaired UE functional use, Increased fascial restricitons, Decreased range of motion, Increased edema  Visit Diagnosis: Other symptoms and signs involving the musculoskeletal system  Stiffness of left shoulder, not elsewhere classified  Acute pain of left shoulder    Problem List Patient Active Problem List   Diagnosis Date Noted  . Depression 03/10/2016  . Right arm pain 10/31/2014  . Overuse injury 10/31/2014  . Cervical disc disorder with radiculopathy of cervical region 10/31/2014  . IBS (irritable bowel syndrome) 10/19/2013  . Back pain 01/02/2013  . Hyperlipidemia LDL goal <130 09/20/2012  . Essential hypertension, benign 09/20/2012  . GERD (gastroesophageal reflux disease) 12/30/2011  . GRANULOMA 08/27/2009  . Dysphagia 06/17/2009  . PROSTATITIS, HX OF 06/17/2009   Ailene Ravel, OTR/L,CBIS  [phone]  05/26/2016, 10:30 AM  [place], [place], [address] Phone: [phone]   Fax:  [phone]  Name: Adam [name] MRN: [id number] Date of Birth: [birth year]

## 2016-05-29 ENCOUNTER — Ambulatory Visit (HOSPITAL_COMMUNITY): Payer: BLUE CROSS/BLUE SHIELD

## 2016-05-29 ENCOUNTER — Encounter (HOSPITAL_COMMUNITY): Payer: Self-pay

## 2016-05-29 DIAGNOSIS — M25512 Pain in left shoulder: Secondary | ICD-10-CM | POA: Diagnosis not present

## 2016-05-29 DIAGNOSIS — M25612 Stiffness of left shoulder, not elsewhere classified: Secondary | ICD-10-CM

## 2016-05-29 DIAGNOSIS — R29898 Other symptoms and signs involving the musculoskeletal system: Secondary | ICD-10-CM

## 2016-05-29 NOTE — Therapy (Signed)
[place], [place], [address] Phone: [phone]   Fax:  [phone]  Occupational Therapy Treatment  Patient Details  Name: Adam [name] MRN: [id number] Date of Birth: 04-Feb-1951 Referring Provider: Earlie Server, MD  Encounter Date: 05/29/2016      OT End of Session - 05/29/16 1005    Visit Number 6   Number of Visits 24   Date for OT Re-Evaluation 07/29/16  mini ressess: 07/10/16   Authorization Type 1) Medicare 2) BSBC   Authorization Time Period before 10th visit   Authorization - Visit Number 6   Authorization - Number of Visits 10   OT Start Time 0945   OT Stop Time 1016   OT Time Calculation (min) 31 min   Activity Tolerance Patient tolerated treatment well   Behavior During Therapy [facility] for tasks assessed/performed      Past Medical History:  Diagnosis Date  . Allergy   . BMI 31.0-31.9,adult JUNE 2011 180 LBS   . Chronic back pain   . Depression   . Eosinophilic granuloma ([place]) JAN 2011 GASTRIC, followed by Dr. Eugenia Pancoast   EGD JAN 2011, JUN 2011, NOV 2011  . Gastric ulcer 08/31/2012  . GERD (gastroesophageal reflux disease)   . HTN (hypertension)   . Hyperlipemia   . IBS (irritable bowel syndrome)   . Prostate hypertrophy     Past Surgical History:  Procedure Laterality Date  . BACK SURGERY    . COLONOSCOPY  JAN 2011 COMPLETE   hypoTN and bradycardia w/ Demerol ,Phenergan and Versed, [place] TICS, SML IH  . COLONOSCOPY  2003   NUR-normal  . COLONOSCOPY  06/21/09   Fields-sigmoid diverticulosis, sm int hemorrhoids  . COLONOSCOPY N/A 10/24/2013   Normal mucosa in the terminal ileum Moderate diverticulosis noted in the sigmoid colonModerate sized internal hemorrhoids. negative random colon bx.. next TCS 10/2023  . Cyst Removed     from Left leg and knee  . ESOPHAGOGASTRODUODENOSCOPY  05/20/2012   SLF: MILD ESOPHAGITIS.NO BARRETT'S/Multiple ulcers ranging between 3-5 mm/MILD Duodenal  inflammation was found in the duodenal bulb  . ESOPHAGOGASTRODUODENOSCOPY N/A 09/05/2012   SLF: MILD Non-erosive gastritis (inflammation) in the gastric antrum. Biopsies benign, no H. pylori.  . ESOPHAGOGASTRODUODENOSCOPY N/A 10/24/2013   NOI:[id number] DISTAL ESOPHAGEAL WEBSmall hiatal herniaMILD Non-erosive gastritis. SB bx benign. gastric bx, minimal inflammation.   Marland Kitchen GANGLION CYST EXCISION     from L wrist and pins placed for Fx involving L thumb  . HAND SURGERY  at the age of 69   Brother accidentally cut it  . MALONEY DILATION N/A 10/24/2013   Procedure: MALONEY DILATION;  Surgeon: Danie Binder, MD;  [facility];  Service: Endoscopy;  Laterality: N/A;  . MASS EXCISION  04/29/2012   Procedure: EXCISION MASS;  Surgeon: Donato Heinz, MD;  Location: [facility] OR[facility];  Service: General;  Laterality: Right;  Excision of Vascular Mass Right Arm  . SAVORY DILATION N/A 10/24/2013   Procedure: SAVORY DILATION;  Surgeon: Danie Binder, MD;  [facility];  Service: Endoscopy;  Laterality: N/A;  . TUMOR REMOVAL  1994   Benign [facility] brain at [facility]  . UPPER GASTROINTESTINAL ENDOSCOPY  JAN 2011   GASTRIC ULCER-EOS GRANULOMA  . UPPER GASTROINTESTINAL ENDOSCOPY  JUN 2011   GASTRIC ULCER- EOS, NO H. PYLORI  . UPPER GASTROINTESTINAL ENDOSCOPY  NOV 2011   GASTRIC NODULE, no ulcer- EOS    There were no vitals  filed for this visit.      Subjective Assessment - 05/29/16 1003    Subjective  S: I see the Doctor on the 4th so I changed my appointments that week. (Now coming back to back days)   Currently in Pain? No/denies            [facility] OT Assessment - 05/29/16 1004      Assessment   Diagnosis Left shoulder RTC repair     Precautions   Precautions Shoulder   Type of Shoulder Precautions Non-agressive RTC Repair Protocol. Per Dr. French Ana No abduction for 6 weeks. Week 0-5 (06/10/16): P/ROM within following limits: Flexion (no limits), er 0-30, IR (no limits). pendulums,  wrist A/ROM, scapular A/ROM. In 2 weeks (05/20/16) Begin glides and scapular distraction. Week 5-6 (06/17/16): P/ROM to following limits: er (0-45); Abduction A/ROM prone shoulder extension to neutral, start isometric IR and extension. Week 6-7 (06/24/16): ROM limits: er (0-80) and Abduction (80 degrees). All isometrics, scapular strengthening with theraband. Week 7-8 (07/01/16): No limits for ROM. AA/ROM exercises standing. Week 8-9 (07/08/16): UBE. Prone PREs. A/ROM exercies. Week 9+: strengthening exercises.    Shoulder Interventions Shoulder sling/immobilizer;Off for dressing/bathing/exercises;At all times                  OT Treatments/Exercises (OP) - 05/29/16 1004      Exercises   Exercises Shoulder     Shoulder Exercises: Supine   Protraction PROM;10 reps   Horizontal ABduction PROM;10 reps  adduction only   External Rotation PROM;10 reps   Internal Rotation PROM;10 reps   Flexion PROM;10 reps   ABduction --  No abduction per MD   Other Supine Exercises Bridges 10X     Shoulder Exercises: Seated   Elevation AROM;15 reps   Extension AROM;15 reps   Row AROM;15 reps   Other Seated Exercises Elbow flexion/extension 10X   Other Seated Exercises Supination/pronation elbow; 10X     Shoulder Exercises: Therapy Ball   Flexion 15 reps     Shoulder Exercises: ROM/Strengthening   Thumb Tacks 1' low level   Anterior Glide 3x10"   Caudal Glide 3x10"   Prot/Ret//Elev/Dep 1' low level     Shoulder Exercises: Stretch   Other Shoulder Stretches Shoulder distraction stretch; seated; 3x10"     Manual Therapy   Manual Therapy Myofascial release   Manual therapy comments Manual therapy completed prior to exercises   Myofascial Release myofascial release and manual stretching completed to left upper arm, trapeziu, and scapularis region to decrease fascial restrctions and increase joint                   OT Short Term Goals - 05/26/16 1025      OT SHORT TERM GOAL #1    Title Patient will be educated and independent with HEP to increase functional use of LUE during daily tasks.    Time 6   Period Weeks   Status On-going     OT SHORT TERM GOAL #2   Title Patient will increase P/ROM to WNL to increase ability to get shirts on and off easier.    Time 6   Period Weeks   Status On-going     OT SHORT TERM GOAL #3   Title Patient will decrease pain to 5/10 when completing daily tasks with LUE.    Time 6   Period Weeks   Status On-going     OT SHORT TERM GOAL #4   Title Patient will increase strength  to 3+/5 to increase ability to complete daily tasks at waist level.   Time 6   Period Weeks   Status On-going     OT SHORT TERM GOAL #5   Title Patient will decrease fascial restrictions to Mod amount in LUE to increase functional mobility needed for reaching tasks.    Time 6   Period Weeks   Status On-going           OT Long Term Goals - 05/26/16 1025      OT LONG TERM GOAL #1   Title Patient will return to highest level of independence with all daily tasks using LUE.   Time 12   Period Weeks   Status On-going     OT LONG TERM GOAL #2   Title Patient will increase LUE strength to 4/5 to increase ability to lift normal household items.   Time 12   Period Weeks   Status On-going     OT LONG TERM GOAL #3   Title Patient will increase RUE A/ROM to WNL to increase ability to complete reaching tasks overhead.   Time 12   Period Weeks   Status On-going     OT LONG TERM GOAL #4   Title Patient will decrease fascial restrictions to min amount or less to increase functional mobility needed for overhead reaching tasks.    Time 12   Period Weeks   Status On-going     OT LONG TERM GOAL #5   Title Patient will decrease pain level to 2/10 or less when completing daily tasks.   Time 12   Period Weeks   Status On-going               Plan - 05/29/16 1018    Clinical Impression Statement A: Added bridges. Patient did tolerate  horizontal adduction further than previous session. patient is doing well with exercises requiring min VC for form and technique. More difficulty with technique remains with pro/ret/elec/dep as he requires verbal and physical cueing to complete.    Plan P: Continue to follow MD protocol. Work on increasing patient's shoulder flexion during P/ROM within pain tolerance.      Patient will benefit from skilled therapeutic intervention in order to improve the following deficits and impairments:  Pain, Impaired UE functional use, Increased fascial restricitons, Decreased range of motion, Increased edema  Visit Diagnosis: Other symptoms and signs involving the musculoskeletal system  Stiffness of left shoulder, not elsewhere classified  Acute pain of left shoulder    Problem List Patient Active Problem List   Diagnosis Date Noted  . Depression 03/10/2016  . Right arm pain 10/31/2014  . Overuse injury 10/31/2014  . Cervical disc disorder with radiculopathy of cervical region 10/31/2014  . IBS (irritable bowel syndrome) 10/19/2013  . Back pain 01/02/2013  . Hyperlipidemia LDL goal <130 09/20/2012  . Essential hypertension, benign 09/20/2012  . GERD (gastroesophageal reflux disease) 12/30/2011  . GRANULOMA 08/27/2009  . Dysphagia 06/17/2009  . PROSTATITIS, HX OF 06/17/2009   Ailene Ravel, OTR/L,CBIS  [phone]  05/29/2016, 10:20 AM  [place], [place], [address] Phone: [phone]   Fax:  [phone]  Name: BENYAMIN JEFF MRN: [id number] Date of Birth: 04-06-1951

## 2016-06-03 ENCOUNTER — Encounter (HOSPITAL_COMMUNITY): Payer: Self-pay | Admitting: Occupational Therapy

## 2016-06-03 ENCOUNTER — Ambulatory Visit (HOSPITAL_COMMUNITY): Payer: BLUE CROSS/BLUE SHIELD | Admitting: Occupational Therapy

## 2016-06-03 DIAGNOSIS — R29898 Other symptoms and signs involving the musculoskeletal system: Secondary | ICD-10-CM | POA: Diagnosis not present

## 2016-06-03 DIAGNOSIS — M25512 Pain in left shoulder: Secondary | ICD-10-CM | POA: Diagnosis not present

## 2016-06-03 DIAGNOSIS — M25612 Stiffness of left shoulder, not elsewhere classified: Secondary | ICD-10-CM | POA: Diagnosis not present

## 2016-06-03 NOTE — Therapy (Signed)
[place], [place], [address] Phone: [phone]   Fax:  [phone]  Occupational Therapy Treatment  Patient Details  Name: Adam [name] MRN: [id number] Date of Birth: [birth year] Referring Provider: Earlie Server, MD  Encounter Date: 06/03/2016      OT End of Session - 06/03/16 1013    Visit Number 7   Number of Visits 24   Date for OT Re-Evaluation 07/29/16  mini ressess: 07/10/16   Authorization Type 1) Medicare 2) BSBC   Authorization Time Period before 10th visit   Authorization - Visit Number 7   Authorization - Number of Visits 10   OT Start Time [phone]   OT Stop Time 1011   OT Time Calculation (min) 43 min   Activity Tolerance Patient tolerated treatment well   Behavior During Therapy [facility] for tasks assessed/performed      Past Medical History:  Diagnosis Date  . Allergy   . BMI 31.0-31.9,adult JUNE 2011 180 LBS   . Chronic back pain   . Depression   . Eosinophilic granuloma ([place]) JAN 2011 GASTRIC, followed by Dr. Eugenia Pancoast   EGD JAN 2011, JUN 2011, NOV 2011  . Gastric ulcer 08/31/2012  . GERD (gastroesophageal reflux disease)   . HTN (hypertension)   . Hyperlipemia   . IBS (irritable bowel syndrome)   . Prostate hypertrophy     Past Surgical History:  Procedure Laterality Date  . BACK SURGERY    . COLONOSCOPY  JAN 2011 COMPLETE   hypoTN and bradycardia w/ Demerol ,Phenergan and Versed, [place] TICS, SML IH  . COLONOSCOPY  2003   NUR-normal  . COLONOSCOPY  06/21/09   Fields-sigmoid diverticulosis, sm int hemorrhoids  . COLONOSCOPY N/A 10/24/2013   Normal mucosa in the terminal ileum Moderate diverticulosis noted in the sigmoid colonModerate sized internal hemorrhoids. negative random colon bx.. next TCS 10/2023  . Cyst Removed     from Left leg and knee  . ESOPHAGOGASTRODUODENOSCOPY  05/20/2012   SLF: MILD ESOPHAGITIS.NO BARRETT'S/Multiple ulcers ranging between 3-5 mm/MILD Duodenal  inflammation was found in the duodenal bulb  . ESOPHAGOGASTRODUODENOSCOPY N/A 09/05/2012   SLF: MILD Non-erosive gastritis (inflammation) in the gastric antrum. Biopsies benign, no H. pylori.  . ESOPHAGOGASTRODUODENOSCOPY N/A 10/24/2013   LEX:[id number] DISTAL ESOPHAGEAL WEBSmall hiatal herniaMILD Non-erosive gastritis. SB bx benign. gastric bx, minimal inflammation.   Marland Kitchen GANGLION CYST EXCISION     from L wrist and pins placed for Fx involving L thumb  . HAND SURGERY  at the age of 32   Brother accidentally cut it  . MALONEY DILATION N/A 10/24/2013   Procedure: MALONEY DILATION;  Surgeon: Danie Binder, MD;  [facility];  Service: Endoscopy;  Laterality: N/A;  . MASS EXCISION  04/29/2012   Procedure: EXCISION MASS;  Surgeon: Donato Heinz, MD;  Location: [facility];  Service: General;  Laterality: Right;  Excision of Vascular Mass Right Arm  . SAVORY DILATION N/A 10/24/2013   Procedure: SAVORY DILATION;  Surgeon: Danie Binder, MD;  [facility];  Service: Endoscopy;  Laterality: N/A;  . TUMOR REMOVAL  1994   Benign [facility] brain at [facility]  . UPPER GASTROINTESTINAL ENDOSCOPY  JAN 2011   GASTRIC ULCER-EOS GRANULOMA  . UPPER GASTROINTESTINAL ENDOSCOPY  JUN 2011   GASTRIC ULCER- EOS, NO H. PYLORI  . UPPER GASTROINTESTINAL ENDOSCOPY  NOV 2011   GASTRIC NODULE, no ulcer- EOS    There were no vitals  filed for this visit.      Subjective Assessment - 06/03/16 0923    Subjective  S: I did some stretches in the shower before I came today.    Currently in Pain? No/denies            [facility] OT Assessment - 06/03/16 2130      Assessment   Diagnosis Left shoulder RTC repair     Precautions   Precautions Shoulder   Type of Shoulder Precautions Non-agressive RTC Repair Protocol. Per Dr. French Ana No abduction for 6 weeks. Week 0-5 (06/10/16): P/ROM within following limits: Flexion (no limits), er 0-30, IR (no limits). pendulums, wrist A/ROM, scapular A/ROM. In 2 weeks  (05/20/16) Begin glides and scapular distraction. Week 5-6 (06/17/16): P/ROM to following limits: er (0-45); Abduction A/ROM prone shoulder extension to neutral, start isometric IR and extension. Week 6-7 (06/24/16): ROM limits: er (0-80) and Abduction (80 degrees). All isometrics, scapular strengthening with theraband. Week 7-8 (07/01/16): No limits for ROM. AA/ROM exercises standing. Week 8-9 (07/08/16): UBE. Prone PREs. A/ROM exercies. Week 9+: strengthening exercises.    Shoulder Interventions Shoulder sling/immobilizer;Off for dressing/bathing/exercises;At all times                  OT Treatments/Exercises (OP) - 06/03/16 0930      Exercises   Exercises Shoulder     Shoulder Exercises: Supine   Protraction PROM;10 reps   Horizontal ABduction PROM;10 reps  adduction only   External Rotation PROM;10 reps   Internal Rotation PROM;10 reps   Flexion PROM;10 reps   ABduction --  no abduction per protocol   Other Supine Exercises Bridges 10X     Shoulder Exercises: Seated   Elevation AROM;20 reps   Extension AROM;20 reps   Row AROM;20 reps     Shoulder Exercises: Therapy Ball   Flexion 15 reps   Other Therapy Ball Exercises Bent over horizontal adduction with large blue therapy ball; 10X     Shoulder Exercises: ROM/Strengthening   Thumb Tacks 1' low level   Anterior Glide 3x10"   Caudal Glide 3x10"   Prot/Ret//Elev/Dep 1' low level     Shoulder Exercises: Stretch   Other Shoulder Stretches Shoulder distraction stretch; seated; 3x10"     Manual Therapy   Manual Therapy Myofascial release   Manual therapy comments Manual therapy completed prior to exercises   Myofascial Release myofascial release and manual stretching completed to left upper arm, trapeziu, and scapularis region to decrease fascial restrctions and increase joint                   OT Short Term Goals - 05/26/16 1025      OT SHORT TERM GOAL #1   Title Patient will be educated and independent  with HEP to increase functional use of LUE during daily tasks.    Time 6   Period Weeks   Status On-going     OT SHORT TERM GOAL #2   Title Patient will increase P/ROM to WNL to increase ability to get shirts on and off easier.    Time 6   Period Weeks   Status On-going     OT SHORT TERM GOAL #3   Title Patient will decrease pain to 5/10 when completing daily tasks with LUE.    Time 6   Period Weeks   Status On-going     OT SHORT TERM GOAL #4   Title Patient will increase strength to 3+/5 to increase ability to complete daily  tasks at waist level.   Time 6   Period Weeks   Status On-going     OT SHORT TERM GOAL #5   Title Patient will decrease fascial restrictions to Mod amount in LUE to increase functional mobility needed for reaching tasks.    Time 6   Period Weeks   Status On-going           OT Long Term Goals - 05/26/16 1025      OT LONG TERM GOAL #1   Title Patient will return to highest level of independence with all daily tasks using LUE.   Time 12   Period Weeks   Status On-going     OT LONG TERM GOAL #2   Title Patient will increase LUE strength to 4/5 to increase ability to lift normal household items.   Time 12   Period Weeks   Status On-going     OT LONG TERM GOAL #3   Title Patient will increase RUE A/ROM to WNL to increase ability to complete reaching tasks overhead.   Time 12   Period Weeks   Status On-going     OT LONG TERM GOAL #4   Title Patient will decrease fascial restrictions to min amount or less to increase functional mobility needed for overhead reaching tasks.    Time 12   Period Weeks   Status On-going     OT LONG TERM GOAL #5   Title Patient will decrease pain level to 2/10 or less when completing daily tasks.   Time 12   Period Weeks   Status On-going               Plan - 06/03/16 1013    Clinical Impression Statement A: Continued with P/ROM, scapular A/ROM per MD protocol. Pt able to tolerate P/ROM flexion WFL  this session, continued with horizontal adduction with therapy ball. Improved independence in technique with prot/ret/elev/dep, only initial cuing required.    Plan P: Continue with MD protocol working on improved independence in form and technique during exercises    OT Home Exercise Plan 12/4: table slides, pendulums, A/ROM elbow and wrist   Consulted and Agree with Plan of Care Patient      Patient will benefit from skilled therapeutic intervention in order to improve the following deficits and impairments:  Pain, Impaired UE functional use, Increased fascial restricitons, Decreased range of motion, Increased edema  Visit Diagnosis: Other symptoms and signs involving the musculoskeletal system  Stiffness of left shoulder, not elsewhere classified  Acute pain of left shoulder    Problem List Patient Active Problem List   Diagnosis Date Noted  . Depression 03/10/2016  . Right arm pain 10/31/2014  . Overuse injury 10/31/2014  . Cervical disc disorder with radiculopathy of cervical region 10/31/2014  . IBS (irritable bowel syndrome) 10/19/2013  . Back pain 01/02/2013  . Hyperlipidemia LDL goal <130 09/20/2012  . Essential hypertension, benign 09/20/2012  . GERD (gastroesophageal reflux disease) 12/30/2011  . GRANULOMA 08/27/2009  . Dysphagia 06/17/2009  . PROSTATITIS, HX OF 06/17/2009   Guadelupe Sabin, OTR/L  [phone] 06/03/2016, 10:16 AM  [place], [place], [address] Phone: [phone]   Fax:  [phone]  Name: Adam [name] MRN: [id number] Date of Birth: January 12, 1951

## 2016-06-05 ENCOUNTER — Ambulatory Visit (HOSPITAL_COMMUNITY): Payer: BLUE CROSS/BLUE SHIELD | Admitting: Occupational Therapy

## 2016-06-05 ENCOUNTER — Encounter (HOSPITAL_COMMUNITY): Payer: Self-pay | Admitting: Occupational Therapy

## 2016-06-05 DIAGNOSIS — M25512 Pain in left shoulder: Secondary | ICD-10-CM

## 2016-06-05 DIAGNOSIS — M25612 Stiffness of left shoulder, not elsewhere classified: Secondary | ICD-10-CM | POA: Diagnosis not present

## 2016-06-05 DIAGNOSIS — R29898 Other symptoms and signs involving the musculoskeletal system: Secondary | ICD-10-CM

## 2016-06-05 NOTE — Therapy (Signed)
[place], [place], [address] Phone: [phone]   Fax:  [phone]  Occupational Therapy Treatment  Patient Details  Name: Adam [name] MRN: [id number] Date of Birth: 1950/09/12 Referring Provider: Earlie Server, MD  Encounter Date: 06/05/2016      OT End of Session - 06/05/16 1013    Visit Number 8   Number of Visits 24   Date for OT Re-Evaluation 07/29/16  mini ressess: 07/10/16   Authorization Type 1) Medicare 2) BSBC   Authorization Time Period before 10th visit   Authorization - Visit Number 8   Authorization - Number of Visits 10   OT Start Time [phone]   OT Stop Time 1004   OT Time Calculation (min) 39 min   Activity Tolerance Patient tolerated treatment well   Behavior During Therapy [facility] for tasks assessed/performed      Past Medical History:  Diagnosis Date  . Allergy   . BMI 31.0-31.9,adult JUNE 2011 180 LBS   . Chronic back pain   . Depression   . Eosinophilic granuloma ([place]) JAN 2011 GASTRIC, followed by Dr. Eugenia Pancoast   EGD JAN 2011, JUN 2011, NOV 2011  . Gastric ulcer 08/31/2012  . GERD (gastroesophageal reflux disease)   . HTN (hypertension)   . Hyperlipemia   . IBS (irritable bowel syndrome)   . Prostate hypertrophy     Past Surgical History:  Procedure Laterality Date  . BACK SURGERY    . COLONOSCOPY  JAN 2011 COMPLETE   hypoTN and bradycardia w/ Demerol ,Phenergan and Versed, [place] TICS, SML IH  . COLONOSCOPY  2003   NUR-normal  . COLONOSCOPY  06/21/09   Fields-sigmoid diverticulosis, sm int hemorrhoids  . COLONOSCOPY N/A 10/24/2013   Normal mucosa in the terminal ileum Moderate diverticulosis noted in the sigmoid colonModerate sized internal hemorrhoids. negative random colon bx.. next TCS 10/2023  . Cyst Removed     from Left leg and knee  . ESOPHAGOGASTRODUODENOSCOPY  05/20/2012   SLF: MILD ESOPHAGITIS.NO BARRETT'S/Multiple ulcers ranging between 3-5 mm/MILD Duodenal  inflammation was found in the duodenal bulb  . ESOPHAGOGASTRODUODENOSCOPY N/A 09/05/2012   SLF: MILD Non-erosive gastritis (inflammation) in the gastric antrum. Biopsies benign, no H. pylori.  . ESOPHAGOGASTRODUODENOSCOPY N/A 10/24/2013   HCW:CBJSEGBT DISTAL ESOPHAGEAL WEBSmall hiatal herniaMILD Non-erosive gastritis. SB bx benign. gastric bx, minimal inflammation.   Marland Kitchen GANGLION CYST EXCISION     from L wrist and pins placed for Fx involving L thumb  . HAND SURGERY  at the age of 37   Brother accidentally cut it  . MALONEY DILATION N/A 10/24/2013   Procedure: MALONEY DILATION;  Surgeon: Danie Binder, MD;  [facility];  Service: Endoscopy;  Laterality: N/A;  . MASS EXCISION  04/29/2012   Procedure: EXCISION MASS;  Surgeon: Donato Heinz, MD;  Location: [facility];  Service: General;  Laterality: Right;  Excision of Vascular Mass Right Arm  . SAVORY DILATION N/A 10/24/2013   Procedure: SAVORY DILATION;  Surgeon: Danie Binder, MD;  Location: [facility];  Service: Endoscopy;  Laterality: N/A;  . TUMOR REMOVAL  1994   Benign [facility] brain at [facility]  . UPPER GASTROINTESTINAL ENDOSCOPY  JAN 2011   GASTRIC ULCER-EOS GRANULOMA  . UPPER GASTROINTESTINAL ENDOSCOPY  JUN 2011   GASTRIC ULCER- EOS, NO H. PYLORI  . UPPER GASTROINTESTINAL ENDOSCOPY  NOV 2011   GASTRIC NODULE, no ulcer- EOS    There were no vitals  filed for this visit.      Subjective Assessment - 06/05/16 0924    Subjective  S: No pain so far today.    Currently in Pain? No/denies            [facility] OT Assessment - 06/05/16 6503      Assessment   Diagnosis Left shoulder RTC repair     Precautions   Precautions Shoulder   Type of Shoulder Precautions Non-agressive RTC Repair Protocol. Per Dr. French Ana No abduction for 6 weeks. Week 0-5 (06/10/16): P/ROM within following limits: Flexion (no limits), er 0-30, IR (no limits). pendulums, wrist A/ROM, scapular A/ROM. In 2 weeks (05/20/16) Begin glides and scapular  distraction. Week 5-6 (06/17/16): P/ROM to following limits: er (0-45); Abduction A/ROM prone shoulder extension to neutral, start isometric IR and extension. Week 6-7 (06/24/16): ROM limits: er (0-80) and Abduction (80 degrees). All isometrics, scapular strengthening with theraband. Week 7-8 (07/01/16): No limits for ROM. AA/ROM exercises standing. Week 8-9 (07/08/16): UBE. Prone PREs. A/ROM exercies. Week 9+: strengthening exercises.    Shoulder Interventions Shoulder sling/immobilizer;Off for dressing/bathing/exercises;At all times                  OT Treatments/Exercises (OP) - 06/05/16 0927      Exercises   Exercises Shoulder     Shoulder Exercises: Supine   Protraction PROM;10 reps   Horizontal ABduction PROM;10 reps  no abduction   External Rotation PROM;10 reps   Internal Rotation PROM;10 reps   Flexion PROM;10 reps   ABduction --  no abduction     Shoulder Exercises: Seated   Elevation AROM;20 reps   Extension AROM;20 reps   Row AROM;20 reps     Shoulder Exercises: Therapy Ball   Flexion 15 reps   Other Therapy Ball Exercises Bent over horizontal adduction with large blue therapy ball; 15X     Shoulder Exercises: ROM/Strengthening   Thumb Tacks 1' low level   Anterior Glide 3x10"   Caudal Glide 3x10"   Prot/Ret//Elev/Dep 1' low level     Shoulder Exercises: Stretch   Other Shoulder Stretches Shoulder distraction stretch; seated; 3x10"     Manual Therapy   Manual Therapy Myofascial release   Manual therapy comments Manual therapy completed prior to exercises   Myofascial Release myofascial release and manual stretching completed to left upper arm, trapeziu, and scapularis region to decrease fascial restrctions and increase joint                   OT Short Term Goals - 05/26/16 1025      OT SHORT TERM GOAL #1   Title Patient will be educated and independent with HEP to increase functional use of LUE during daily tasks.    Time 6   Period Weeks    Status On-going     OT SHORT TERM GOAL #2   Title Patient will increase P/ROM to WNL to increase ability to get shirts on and off easier.    Time 6   Period Weeks   Status On-going     OT SHORT TERM GOAL #3   Title Patient will decrease pain to 5/10 when completing daily tasks with LUE.    Time 6   Period Weeks   Status On-going     OT SHORT TERM GOAL #4   Title Patient will increase strength to 3+/5 to increase ability to complete daily tasks at waist level.   Time 6   Period Weeks   Status  On-going     OT SHORT TERM GOAL #5   Title Patient will decrease fascial restrictions to Mod amount in LUE to increase functional mobility needed for reaching tasks.    Time 6   Period Weeks   Status On-going           OT Long Term Goals - 05/26/16 1025      OT LONG TERM GOAL #1   Title Patient will return to highest level of independence with all daily tasks using LUE.   Time 12   Period Weeks   Status On-going     OT LONG TERM GOAL #2   Title Patient will increase LUE strength to 4/5 to increase ability to lift normal household items.   Time 12   Period Weeks   Status On-going     OT LONG TERM GOAL #3   Title Patient will increase RUE A/ROM to WNL to increase ability to complete reaching tasks overhead.   Time 12   Period Weeks   Status On-going     OT LONG TERM GOAL #4   Title Patient will decrease fascial restrictions to min amount or less to increase functional mobility needed for overhead reaching tasks.    Time 12   Period Weeks   Status On-going     OT LONG TERM GOAL #5   Title Patient will decrease pain level to 2/10 or less when completing daily tasks.   Time 12   Period Weeks   Status On-going               Plan - 06/05/16 1013    Clinical Impression Statement A: Continued to follow protocol with P/ROM, scapular A/ROM, therapy ball exercises, and glides. Pt required increased cuing for prot/ret/elev/dep in comparison to previous session.     Plan P: Update G-Code, continue with MD protocol working to improve P/ROM flexion to WNL as well as improving independence with form and technique during exercises.    OT Home Exercise Plan 12/4: table slides, pendulums, A/ROM elbow and wrist   Consulted and Agree with Plan of Care Patient      Patient will benefit from skilled therapeutic intervention in order to improve the following deficits and impairments:  Pain, Impaired UE functional use, Increased fascial restricitons, Decreased range of motion, Increased edema  Visit Diagnosis: Other symptoms and signs involving the musculoskeletal system  Stiffness of left shoulder, not elsewhere classified  Acute pain of left shoulder    Problem List Patient Active Problem List   Diagnosis Date Noted  . Depression 03/10/2016  . Right arm pain 10/31/2014  . Overuse injury 10/31/2014  . Cervical disc disorder with radiculopathy of cervical region 10/31/2014  . IBS (irritable bowel syndrome) 10/19/2013  . Back pain 01/02/2013  . Hyperlipidemia LDL goal <130 09/20/2012  . Essential hypertension, benign 09/20/2012  . GERD (gastroesophageal reflux disease) 12/30/2011  . GRANULOMA 08/27/2009  . Dysphagia 06/17/2009  . PROSTATITIS, HX OF 06/17/2009   Guadelupe Sabin, OTR/L  [phone] 06/05/2016, 10:15 AM  [place] [address] [place], [place], [address] Phone: [phone]   Fax:  [phone]  Name: Adam [name] MRN: [id number] Date of Birth: 1951/05/04

## 2016-06-09 ENCOUNTER — Encounter (HOSPITAL_COMMUNITY): Payer: Self-pay

## 2016-06-09 ENCOUNTER — Ambulatory Visit (HOSPITAL_COMMUNITY): Payer: BLUE CROSS/BLUE SHIELD | Attending: Orthopedic Surgery

## 2016-06-09 DIAGNOSIS — R29898 Other symptoms and signs involving the musculoskeletal system: Secondary | ICD-10-CM

## 2016-06-09 DIAGNOSIS — M25512 Pain in left shoulder: Secondary | ICD-10-CM | POA: Diagnosis not present

## 2016-06-09 DIAGNOSIS — M25612 Stiffness of left shoulder, not elsewhere classified: Secondary | ICD-10-CM | POA: Diagnosis not present

## 2016-06-09 NOTE — Therapy (Signed)
[place], [place], [address] Phone: [phone]   Fax:  [phone]  Occupational Therapy Treatment  Patient Details  Name: Adam [name] MRN: [id number] Date of Birth: 12-05-1950 Referring Provider: Earlie Server, MD  Encounter Date: 06/09/2016      OT End of Session - 06/09/16 0954    Visit Number 9   Number of Visits 24   Date for OT Re-Evaluation 07/29/16  mini ressess: 07/10/16   Authorization Type 1) Medicare 2) BSBC   Authorization Time Period before 19th visit   Authorization - Visit Number 9   Authorization - Number of Visits 19   OT Start Time 0919   OT Stop Time 1000   OT Time Calculation (min) 41 min   Activity Tolerance Patient tolerated treatment well   Behavior During Therapy [facility] for tasks assessed/performed      Past Medical History:  Diagnosis Date  . Allergy   . BMI 31.0-31.9,adult JUNE 2011 180 LBS   . Chronic back pain   . Depression   . Eosinophilic granuloma ([place]) JAN 2011 GASTRIC, followed by Dr. Eugenia Pancoast   EGD JAN 2011, JUN 2011, NOV 2011  . Gastric ulcer 08/31/2012  . GERD (gastroesophageal reflux disease)   . HTN (hypertension)   . Hyperlipemia   . IBS (irritable bowel syndrome)   . Prostate hypertrophy     Past Surgical History:  Procedure Laterality Date  . BACK SURGERY    . COLONOSCOPY  JAN 2011 COMPLETE   hypoTN and bradycardia w/ Demerol ,Phenergan and Versed, [place] TICS, SML IH  . COLONOSCOPY  2003   NUR-normal  . COLONOSCOPY  06/21/09   Fields-sigmoid diverticulosis, sm int hemorrhoids  . COLONOSCOPY N/A 10/24/2013   Normal mucosa in the terminal ileum Moderate diverticulosis noted in the sigmoid colonModerate sized internal hemorrhoids. negative random colon bx.. next TCS 10/2023  . Cyst Removed     from Left leg and knee  . ESOPHAGOGASTRODUODENOSCOPY  05/20/2012   SLF: MILD ESOPHAGITIS.NO BARRETT'S/Multiple ulcers ranging between 3-5 mm/MILD Duodenal  inflammation was found in the duodenal bulb  . ESOPHAGOGASTRODUODENOSCOPY N/A 09/05/2012   SLF: MILD Non-erosive gastritis (inflammation) in the gastric antrum. Biopsies benign, no H. pylori.  . ESOPHAGOGASTRODUODENOSCOPY N/A 10/24/2013   OTL:XBWIOMBT DISTAL ESOPHAGEAL WEBSmall hiatal herniaMILD Non-erosive gastritis. SB bx benign. gastric bx, minimal inflammation.   Marland Kitchen GANGLION CYST EXCISION     from L wrist and pins placed for Fx involving L thumb  . HAND SURGERY  at the age of 41   Brother accidentally cut it  . MALONEY DILATION N/A 10/24/2013   Procedure: MALONEY DILATION;  Surgeon: Danie Binder, MD;  [facility];  Service: Endoscopy;  Laterality: N/A;  . MASS EXCISION  04/29/2012   Procedure: EXCISION MASS;  Surgeon: Donato Heinz, MD;  Location: [facility];  Service: General;  Laterality: Right;  Excision of Vascular Mass Right Arm  . SAVORY DILATION N/A 10/24/2013   Procedure: SAVORY DILATION;  Surgeon: Danie Binder, MD;  [facility];  Service: Endoscopy;  Laterality: N/A;  . TUMOR REMOVAL  1994   Benign from brain at [facility]  . UPPER GASTROINTESTINAL ENDOSCOPY  JAN 2011   GASTRIC ULCER-EOS GRANULOMA  . UPPER GASTROINTESTINAL ENDOSCOPY  JUN 2011   GASTRIC ULCER- EOS, NO H. PYLORI  . UPPER GASTROINTESTINAL ENDOSCOPY  NOV 2011   GASTRIC NODULE, no ulcer- EOS    There were no vitals  filed for this visit.      Subjective Assessment - 06/09/16 0937    Subjective  S: I see the Doctor on the 4th.    Special Tests FOTO score: 34/100 (66% impaired)   Currently in Pain? No/denies            [facility] OT Assessment - 06/09/16 1962      Assessment   Diagnosis Left shoulder RTC repair     Precautions   Precautions Shoulder   Type of Shoulder Precautions Non-agressive RTC Repair Protocol. Per Dr. French Ana No abduction for 6 weeks. Week 0-5 (06/10/16): P/ROM within following limits: Flexion (no limits), er 0-30, IR (no limits). pendulums, wrist A/ROM,  scapular A/ROM. In 2 weeks (05/20/16) Begin glides and scapular distraction. Week 5-6 (06/17/16): P/ROM to following limits: er (0-45); Abduction A/ROM prone shoulder extension to neutral, start isometric IR and extension. Week 6-7 (06/24/16): ROM limits: er (0-80) and Abduction (80 degrees). All isometrics, scapular strengthening with theraband. Week 7-8 (07/01/16): No limits for ROM. AA/ROM exercises standing. Week 8-9 (07/08/16): UBE. Prone PREs. A/ROM exercies. Week 9+: strengthening exercises.    Shoulder Interventions Shoulder sling/immobilizer;Off for dressing/bathing/exercises;At all times                  OT Treatments/Exercises (OP) - 06/09/16 2297      Exercises   Exercises Shoulder     Shoulder Exercises: Supine   Protraction PROM;10 reps   Horizontal ABduction PROM;10 reps  no abduction. Adduction only   External Rotation PROM;10 reps   Internal Rotation PROM;10 reps   Flexion PROM;10 reps   ABduction --  no abduction     Shoulder Exercises: Seated   Elevation AROM;20 reps   Extension AROM;20 reps   Row AROM;20 reps     Shoulder Exercises: Therapy Ball   Flexion 20 reps     Shoulder Exercises: ROM/Strengthening   Thumb Tacks 1' low level   Anterior Glide 3x20"   Caudal Glide 3x20"   Prot/Ret//Elev/Dep 1' low level     Shoulder Exercises: Stretch   Other Shoulder Stretches Shoulder distraction stretch; seated; 3x20"     Manual Therapy   Manual Therapy Myofascial release   Manual therapy comments Manual therapy completed prior to exercises   Myofascial Release myofascial release and manual stretching completed to left upper arm, trapeziu, and scapularis region to decrease fascial restrctions and increase joint                   OT Short Term Goals - 05/26/16 1025      OT SHORT TERM GOAL #1   Title Patient will be educated and independent with HEP to increase functional use of LUE during daily tasks.    Time 6   Period Weeks   Status  On-going     OT SHORT TERM GOAL #2   Title Patient will increase P/ROM to WNL to increase ability to get shirts on and off easier.    Time 6   Period Weeks   Status On-going     OT SHORT TERM GOAL #3   Title Patient will decrease pain to 5/10 when completing daily tasks with LUE.    Time 6   Period Weeks   Status On-going     OT SHORT TERM GOAL #4   Title Patient will increase strength to 3+/5 to increase ability to complete daily tasks at waist level.   Time 6   Period Weeks   Status On-going  OT SHORT TERM GOAL #5   Title Patient will decrease fascial restrictions to Mod amount in LUE to increase functional mobility needed for reaching tasks.    Time 6   Period Weeks   Status On-going           OT Long Term Goals - 05/26/16 1025      OT LONG TERM GOAL #1   Title Patient will return to highest level of independence with all daily tasks using LUE.   Time 12   Period Weeks   Status On-going     OT LONG TERM GOAL #2   Title Patient will increase LUE strength to 4/5 to increase ability to lift normal household items.   Time 12   Period Weeks   Status On-going     OT LONG TERM GOAL #3   Title Patient will increase RUE A/ROM to WNL to increase ability to complete reaching tasks overhead.   Time 12   Period Weeks   Status On-going     OT LONG TERM GOAL #4   Title Patient will decrease fascial restrictions to min amount or less to increase functional mobility needed for overhead reaching tasks.    Time 12   Period Weeks   Status On-going     OT LONG TERM GOAL #5   Title Patient will decrease pain level to 2/10 or less when completing daily tasks.   Time 12   Period Weeks   Status On-going               Plan - 06-11-2016 0954    Clinical Impression Statement A: Patient doing well with protocol and is able to tolerate shoulder flexion passively to approximately 75% range. Mild cueing for form and technique. Pt continue to be very limited with LUE.  patient is wearing sling 24/7 and is not allowed to actively move shoulder at this time. Patient continues to have deficits with strength, ROM, and fascial restrictions.    Plan P: Measure for MD appointment and print off for patient to take as well as sending to MD. Continue with protocol. Increase height of thumb tacks if able to tolerate.       Patient will benefit from skilled therapeutic intervention in order to improve the following deficits and impairments:  Pain, Impaired UE functional use, Increased fascial restricitons, Decreased range of motion, Increased edema  Visit Diagnosis: Other symptoms and signs involving the musculoskeletal system  Stiffness of left shoulder, not elsewhere classified  Acute pain of left shoulder      G-Codes - 06-11-2016 0956    Functional Assessment Tool Used FOTO score: 34/100 (66% impaired)   Functional Limitation Carrying, moving and handling objects   Carrying, Moving and Handling Objects Current Status (E9937) At least 60 percent but less than 80 percent impaired, limited or restricted   Carrying, Moving and Handling Objects Goal Status (J6967) At least 20 percent but less than 40 percent impaired, limited or restricted      Problem List Patient Active Problem List   Diagnosis Date Noted  . Depression 03/10/2016  . Right arm pain 10/31/2014  . Overuse injury 10/31/2014  . Cervical disc disorder with radiculopathy of cervical region 10/31/2014  . IBS (irritable bowel syndrome) 10/19/2013  . Back pain 01/02/2013  . Hyperlipidemia LDL goal <130 09/20/2012  . Essential hypertension, benign 09/20/2012  . GERD (gastroesophageal reflux disease) 12/30/2011  . GRANULOMA 08/27/2009  . Dysphagia 06/17/2009  . PROSTATITIS, HX OF 06/17/2009  Ailene Ravel, OTR/L,CBIS  [phone]  06/09/2016, 9:59 AM  [facility] [address] [place], [place], [address] Phone: [phone]   Fax:   [phone]  Name: Adam [name] MRN: [id number] Date of Birth: Jul 23, 1950

## 2016-06-10 ENCOUNTER — Encounter (HOSPITAL_COMMUNITY): Payer: Self-pay | Admitting: Occupational Therapy

## 2016-06-10 ENCOUNTER — Ambulatory Visit (HOSPITAL_COMMUNITY): Payer: BLUE CROSS/BLUE SHIELD | Admitting: Occupational Therapy

## 2016-06-10 DIAGNOSIS — R29898 Other symptoms and signs involving the musculoskeletal system: Secondary | ICD-10-CM | POA: Diagnosis not present

## 2016-06-10 DIAGNOSIS — M25612 Stiffness of left shoulder, not elsewhere classified: Secondary | ICD-10-CM

## 2016-06-10 DIAGNOSIS — M25512 Pain in left shoulder: Secondary | ICD-10-CM

## 2016-06-10 NOTE — Therapy (Signed)
[place], [place], [address] Phone: [phone]   Fax:  [phone]  Occupational Therapy Treatment  Patient Details  Name: Adam [name] MRN: [id number] Date of Birth: Jul 15, 1950 Referring Provider: Earlie Server, MD  Encounter Date: 06/10/2016      OT End of Session - 06/10/16 1113    Visit Number 10   Number of Visits 24   Date for OT Re-Evaluation 07/29/16  mini ressess: 07/10/16   Authorization Type 1) Medicare 2) BSBC   Authorization Time Period before 19th visit   Authorization - Visit Number 10   Authorization - Number of Visits 19   OT Start Time 1031   OT Stop Time 1111   OT Time Calculation (min) 40 min   Activity Tolerance Patient tolerated treatment well   Behavior During Therapy [facility] for tasks assessed/performed      Past Medical History:  Diagnosis Date  . Allergy   . BMI 31.0-31.9,adult JUNE 2011 180 LBS   . Chronic back pain   . Depression   . Eosinophilic granuloma ([place]) JAN 2011 GASTRIC, followed by Dr. Eugenia Pancoast   EGD JAN 2011, JUN 2011, NOV 2011  . Gastric ulcer 08/31/2012  . GERD (gastroesophageal reflux disease)   . HTN (hypertension)   . Hyperlipemia   . IBS (irritable bowel syndrome)   . Prostate hypertrophy     Past Surgical History:  Procedure Laterality Date  . BACK SURGERY    . COLONOSCOPY  JAN 2011 COMPLETE   hypoTN and bradycardia w/ Demerol ,Phenergan and Versed, [place] TICS, SML IH  . COLONOSCOPY  2003   NUR-normal  . COLONOSCOPY  06/21/09   Fields-sigmoid diverticulosis, sm int hemorrhoids  . COLONOSCOPY N/A 10/24/2013   Normal mucosa in the terminal ileum Moderate diverticulosis noted in the sigmoid colonModerate sized internal hemorrhoids. negative random colon bx.. next TCS 10/2023  . Cyst Removed     from Left leg and knee  . ESOPHAGOGASTRODUODENOSCOPY  05/20/2012   SLF: MILD ESOPHAGITIS.NO BARRETT'S/Multiple ulcers ranging between 3-5 mm/MILD Duodenal  inflammation was found in the duodenal bulb  . ESOPHAGOGASTRODUODENOSCOPY N/A 09/05/2012   SLF: MILD Non-erosive gastritis (inflammation) in the gastric antrum. Biopsies benign, no H. pylori.  . ESOPHAGOGASTRODUODENOSCOPY N/A 10/24/2013   [id number] DISTAL ESOPHAGEAL WEBSmall hiatal herniaMILD Non-erosive gastritis. SB bx benign. gastric bx, minimal inflammation.   Marland Kitchen GANGLION CYST EXCISION     from L wrist and pins placed for Fx involving L thumb  . HAND SURGERY  at the age of 61   Brother accidentally cut it  . MALONEY DILATION N/A 10/24/2013   Procedure: MALONEY DILATION;  Surgeon: Danie Binder, MD;  [facility];  Service: Endoscopy;  Laterality: N/A;  . MASS EXCISION  04/29/2012   Procedure: EXCISION MASS;  Surgeon: Donato Heinz, MD;  Location: [facility];  Service: General;  Laterality: Right;  Excision of Vascular Mass Right Arm  . SAVORY DILATION N/A 10/24/2013   Procedure: SAVORY DILATION;  Surgeon: Danie Binder, MD;  [facility];  Service: Endoscopy;  Laterality: N/A;  . TUMOR REMOVAL  1994   Benign [facility] brain at [facility]  . UPPER GASTROINTESTINAL ENDOSCOPY  JAN 2011   GASTRIC ULCER-EOS GRANULOMA  . UPPER GASTROINTESTINAL ENDOSCOPY  JUN 2011   GASTRIC ULCER- EOS, NO H. PYLORI  . UPPER GASTROINTESTINAL ENDOSCOPY  NOV 2011   GASTRIC NODULE, no ulcer- EOS    There were no vitals  filed for this visit.      Subjective Assessment - 06/10/16 1031    Subjective  S:    Currently in Pain? No/denies           [facility] OT Assessment - 06/10/16 1031      Assessment   Diagnosis Left shoulder RTC repair     Precautions   Precautions Shoulder   Type of Shoulder Precautions Non-agressive RTC Repair Protocol. Per Dr. French Ana No abduction for 6 weeks. Week 0-5 (06/10/16): P/ROM within following limits: Flexion (no limits), er 0-30, IR (no limits). pendulums, wrist A/ROM, scapular A/ROM. In 2 weeks (05/20/16) Begin glides and scapular distraction. Week 5-6  (06/17/16): P/ROM to following limits: er (0-45); Abduction A/ROM prone shoulder extension to neutral, start isometric IR and extension. Week 6-7 (06/24/16): ROM limits: er (0-80) and Abduction (80 degrees). All isometrics, scapular strengthening with theraband. Week 7-8 (07/01/16): No limits for ROM. AA/ROM exercises standing. Week 8-9 (07/08/16): UBE. Prone PREs. A/ROM exercies. Week 9+: strengthening exercises.    Shoulder Interventions Shoulder sling/immobilizer;Off for dressing/bathing/exercises;At all times     AROM   Overall AROM  Unable to assess;Due to precautions     PROM   Overall PROM Comments Assessed supine. IR/er adducted.   PROM Assessment Site Shoulder   Right/Left Shoulder Left   Left Shoulder Flexion 157 Degrees  65 previous   Left Shoulder ABduction 140 Degrees  91 previous   Left Shoulder Internal Rotation 90 Degrees  same as previous   Left Shoulder External Rotation 60 Degrees  0 previous     Strength   Overall Strength Unable to assess;Due to precautions                  OT Treatments/Exercises (OP) - 06/10/16 1033      Exercises   Exercises Shoulder     Shoulder Exercises: Supine   Protraction PROM;10 reps   Horizontal ABduction PROM;10 reps  adduction only   External Rotation PROM;10 reps   Internal Rotation PROM;10 reps   Flexion PROM;10 reps   ABduction --  no abduction per protocol   Other Supine Exercises Bridges 10X     Shoulder Exercises: Seated   Elevation AROM;20 reps   Extension AROM;20 reps   Row AROM;20 reps     Shoulder Exercises: Therapy Ball   Flexion 20 reps   Other Therapy Ball Exercises Bent over horizontal adduction with large blue therapy ball; 15X     Shoulder Exercises: ROM/Strengthening   Thumb Tacks 1' low level   Anterior Glide 3x20"   Caudal Glide 3x20"   Prot/Ret//Elev/Dep 1' low level     Shoulder Exercises: Stretch   Other Shoulder Stretches Shoulder distraction stretch; seated; 3x20"     Manual  Therapy   Manual Therapy Myofascial release   Manual therapy comments Manual therapy completed prior to exercises   Myofascial Release myofascial release and manual stretching completed to left upper arm, trapeziu, and scapularis region to decrease fascial restrctions and increase joint                  OT Short Term Goals - 05/26/16 1025      OT SHORT TERM GOAL #1   Title Patient will be educated and independent with HEP to increase functional use of LUE during daily tasks.    Time 6   Period Weeks   Status On-going     OT SHORT TERM GOAL #2   Title Patient will increase P/ROM to WNL  to increase ability to get shirts on and off easier.    Time 6   Period Weeks   Status On-going     OT SHORT TERM GOAL #3   Title Patient will decrease pain to 5/10 when completing daily tasks with LUE.    Time 6   Period Weeks   Status On-going     OT SHORT TERM GOAL #4   Title Patient will increase strength to 3+/5 to increase ability to complete daily tasks at waist level.   Time 6   Period Weeks   Status On-going     OT SHORT TERM GOAL #5   Title Patient will decrease fascial restrictions to Mod amount in LUE to increase functional mobility needed for reaching tasks.    Time 6   Period Weeks   Status On-going           OT Long Term Goals - 05/26/16 1025      OT LONG TERM GOAL #1   Title Patient will return to highest level of independence with all daily tasks using LUE.   Time 12   Period Weeks   Status On-going     OT LONG TERM GOAL #2   Title Patient will increase LUE strength to 4/5 to increase ability to lift normal household items.   Time 12   Period Weeks   Status On-going     OT LONG TERM GOAL #3   Title Patient will increase RUE A/ROM to WNL to increase ability to complete reaching tasks overhead.   Time 12   Period Weeks   Status On-going     OT LONG TERM GOAL #4   Title Patient will decrease fascial restrictions to min amount or less to increase  functional mobility needed for overhead reaching tasks.    Time 12   Period Weeks   Status On-going     OT LONG TERM GOAL #5   Title Patient will decrease pain level to 2/10 or less when completing daily tasks.   Time 12   Period Weeks   Status On-going               Plan - 06/10/16 1113    Clinical Impression Statement A: Measurements taken for MD appt tomorrow, pt given copy. Increased height of thumb tacks slightly, mod difficulty maintaining position. Pt continues to display deficits with strength, ROM, and fascial restrictions, and is not allowed to actively move shoulder due to precautions. Pt able to tolerate flexion greater than 75% during P/ROM today.    Plan P: Continue with protocol focusing on improving ROM within set limits. Follow up with MD appt.    OT Home Exercise Plan 12/4: table slides, pendulums, A/ROM elbow and wrist   Consulted and Agree with Plan of Care Patient      Patient will benefit from skilled therapeutic intervention in order to improve the following deficits and impairments:  Pain, Impaired UE functional use, Increased fascial restricitons, Decreased range of motion, Increased edema  Visit Diagnosis: Other symptoms and signs involving the musculoskeletal system  Stiffness of left shoulder, not elsewhere classified  Acute pain of left shoulder   Problem List Patient Active Problem List   Diagnosis Date Noted  . Depression 03/10/2016  . Right arm pain 10/31/2014  . Overuse injury 10/31/2014  . Cervical disc disorder with radiculopathy of cervical region 10/31/2014  . IBS (irritable bowel syndrome) 10/19/2013  . Back pain 01/02/2013  . Hyperlipidemia LDL goal <130 09/20/2012  .  Essential hypertension, benign 09/20/2012  . GERD (gastroesophageal reflux disease) 12/30/2011  . GRANULOMA 08/27/2009  . Dysphagia 06/17/2009  . PROSTATITIS, HX OF 06/17/2009   Guadelupe Sabin, OTR/L  [phone] 06/10/2016, 11:16 AM  [place], [place], [address] Phone: [phone]   Fax:  [phone]  Name: Adam [name] MRN: [id number] Date of Birth: 01-16-1951

## 2016-06-11 ENCOUNTER — Ambulatory Visit (HOSPITAL_COMMUNITY): Payer: BLUE CROSS/BLUE SHIELD

## 2016-06-16 ENCOUNTER — Encounter (HOSPITAL_COMMUNITY): Payer: Self-pay

## 2016-06-16 ENCOUNTER — Ambulatory Visit (HOSPITAL_COMMUNITY): Payer: BLUE CROSS/BLUE SHIELD

## 2016-06-16 DIAGNOSIS — M25612 Stiffness of left shoulder, not elsewhere classified: Secondary | ICD-10-CM

## 2016-06-16 DIAGNOSIS — R29898 Other symptoms and signs involving the musculoskeletal system: Secondary | ICD-10-CM | POA: Diagnosis not present

## 2016-06-16 DIAGNOSIS — M25512 Pain in left shoulder: Secondary | ICD-10-CM

## 2016-06-16 NOTE — Patient Instructions (Signed)
Perform each exercise ____12-15____ reps. 2-3x days.   Protraction - Laying Down  Start by holding a wand or cane at chest height.  Next, slowly push the wand outwards in front of your body so that your elbows become fully straightened. Then, return to the original position.     Shoulder FLEXION - Laying Down - PALMS DOWN  In the standing position, hold a wand/cane with both arms, palms up on both sides. Raise up the wand/cane allowing your unaffected arm to perform most of the effort. Your affected arm should be partially relaxed.      Internal/External ROTATION - Laying Down  In the standing position, hold a wand/cane with both hands keeping your elbows bent. Move your arms and wand/cane to one side.  Your affected arm should be partially relaxed while your unaffected arm performs most of the effort.      1) Shoulder Protraction    Begin with elbows by your side, slowly "punch" straight out in front of you keeping arms/elbows straight.      (Home) Extension: Isometric / Bilateral Arm Retraction - Sitting   Facing anchor, hold hands and elbow at shoulder height, with elbow bent.  Pull arms back to squeeze shoulder blades together. Repeat 10-15 times.  Copyright  VHI. All rights reserved.   (Home) Retraction: Row - Bilateral (Anchor)   Facing anchor, arms reaching forward, pull hands toward stomach, keeping elbows bent and at your sides and pinching shoulder blades together. Repeat 10-15 times.  Copyright  VHI. All rights reserved.   (Clinic) Extension / Flexion (Assist)   Face anchor, pull arms back, keeping elbow straight, and squeze shoulder blades together. Repeat 10-15 times.   Copyright  VHI. All rights reserved.

## 2016-06-16 NOTE — Therapy (Signed)
[place], [place], [address] Phone: [phone]   Fax:  [phone]  Occupational Therapy Treatment  Patient Details  Name: Adam [name] MRN: [id number] Date of Birth: Jul 19, 1950 Referring Provider: Earlie Server, MD  Encounter Date: 06/16/2016      OT End of Session - 06/16/16 1130    Visit Number 11   Number of Visits 24   Date for OT Re-Evaluation 07/29/16  mini ressess: 07/10/16   Authorization Type 1) Medicare 2) BSBC   Authorization Time Period before 19th visit   Authorization - Visit Number 11   Authorization - Number of Visits 19   OT Start Time [phone]   OT Stop Time 1030   OT Time Calculation (min) 35 min   Activity Tolerance Patient tolerated treatment well   Behavior During Therapy [facility] for tasks assessed/performed      Past Medical History:  Diagnosis Date  . Allergy   . BMI 31.0-31.9,adult JUNE 2011 180 LBS   . Chronic back pain   . Depression   . Eosinophilic granuloma ([place]) JAN 2011 GASTRIC, followed by Dr. Eugenia Pancoast   EGD JAN 2011, JUN 2011, NOV 2011  . Gastric ulcer 08/31/2012  . GERD (gastroesophageal reflux disease)   . HTN (hypertension)   . Hyperlipemia   . IBS (irritable bowel syndrome)   . Prostate hypertrophy     Past Surgical History:  Procedure Laterality Date  . BACK SURGERY    . COLONOSCOPY  JAN 2011 COMPLETE   hypoTN and bradycardia w/ Demerol ,Phenergan and Versed,  TICS, SML IH  . COLONOSCOPY  2003   NUR-normal  . COLONOSCOPY  06/21/09   Fields-sigmoid diverticulosis, sm int hemorrhoids  . COLONOSCOPY N/A 10/24/2013   Normal mucosa in the terminal ileum Moderate diverticulosis noted in the sigmoid colonModerate sized internal hemorrhoids. negative random colon bx.. next TCS 10/2023  . Cyst Removed     from Left leg and knee  . ESOPHAGOGASTRODUODENOSCOPY  05/20/2012   SLF: MILD ESOPHAGITIS.NO BARRETT'S/Multiple ulcers ranging between 3-5 mm/MILD Duodenal  inflammation was found in the duodenal bulb  . ESOPHAGOGASTRODUODENOSCOPY N/A 09/05/2012   SLF: MILD Non-erosive gastritis (inflammation) in the gastric antrum. Biopsies benign, no H. pylori.  . ESOPHAGOGASTRODUODENOSCOPY N/A 10/24/2013   [id number] DISTAL ESOPHAGEAL WEBSmall hiatal herniaMILD Non-erosive gastritis. SB bx benign. gastric bx, minimal inflammation.   Marland Kitchen GANGLION CYST EXCISION     from L wrist and pins placed for Fx involving L thumb  . HAND SURGERY  at the age of 58   Brother accidentally cut it  . MALONEY DILATION N/A 10/24/2013   Procedure: MALONEY DILATION;  Surgeon: Danie Binder, MD;  [facility];  Service: Endoscopy;  Laterality: N/A;  . MASS EXCISION  04/29/2012   Procedure: EXCISION MASS;  Surgeon: Donato Heinz, MD;  Location: [facility];  Service: General;  Laterality: Right;  Excision of Vascular Mass Right Arm  . SAVORY DILATION N/A 10/24/2013   Procedure: SAVORY DILATION;  Surgeon: Danie Binder, MD;  [facility];  Service: Endoscopy;  Laterality: N/A;  . TUMOR REMOVAL  1994   Benign from brain at [facility]  . UPPER GASTROINTESTINAL ENDOSCOPY  JAN 2011   GASTRIC ULCER-EOS GRANULOMA  . UPPER GASTROINTESTINAL ENDOSCOPY  JUN 2011   GASTRIC ULCER- EOS, NO H. PYLORI  . UPPER GASTROINTESTINAL ENDOSCOPY  NOV 2011   GASTRIC NODULE, no ulcer- EOS    There were no vitals  filed for this visit.      Subjective Assessment - 06/16/16 1127    Subjective  S: The Doctor was happy. He said I can take the sling off at home and just wear it in public.    Currently in Pain? No/denies                      OT Treatments/Exercises (OP) - 06/16/16 1011      Exercises   Exercises Shoulder     Shoulder Exercises: Supine   Protraction PROM;AAROM;10 reps   Horizontal ABduction PROM;10 reps   External Rotation PROM;AAROM;10 reps   Internal Rotation PROM;AAROM;10 reps   Flexion PROM;AAROM;10 reps   ABduction PROM;10 reps   Other  Supine Exercises Serratus Anterior Punch; 12X     Shoulder Exercises: Prone   Extension AROM;15 reps     Shoulder Exercises: Standing   Extension Theraband;10 reps   Theraband Level (Shoulder Extension) Level 2 (Red)   Row Theraband;10 reps   Theraband Level (Shoulder Row) Level 2 (Red)   Retraction Theraband;10 reps   Theraband Level (Shoulder Retraction) Level 2 (Red)     Shoulder Exercises: Isometric Strengthening   Extension Supine;5X10"   Internal Rotation Supine;5X10"     Manual Therapy   Manual Therapy Myofascial release   Manual therapy comments Manual therapy completed prior to exercises   Myofascial Release myofascial release and manual stretching completed to left upper arm, trapeziu, and scapularis region to decrease fascial restrctions and increase joint                 OT Education - 06/16/16 1129    Education provided Yes   Education Details HEP updated to include next protocol progression. Some AA/ROM supine and scapular theraband strengthening -red   Person(s) Educated Patient   Methods Explanation;Handout;Verbal cues;Demonstration   Comprehension Verbalized understanding;Returned demonstration          OT Short Term Goals - 05/26/16 1025      OT SHORT TERM GOAL #1   Title Patient will be educated and independent with HEP to increase functional use of LUE during daily tasks.    Time 6   Period Weeks   Status On-going     OT SHORT TERM GOAL #2   Title Patient will increase P/ROM to WNL to increase ability to get shirts on and off easier.    Time 6   Period Weeks   Status On-going     OT SHORT TERM GOAL #3   Title Patient will decrease pain to 5/10 when completing daily tasks with LUE.    Time 6   Period Weeks   Status On-going     OT SHORT TERM GOAL #4   Title Patient will increase strength to 3+/5 to increase ability to complete daily tasks at waist level.   Time 6   Period Weeks   Status On-going     OT SHORT TERM GOAL #5   Title  Patient will decrease fascial restrictions to Mod amount in LUE to increase functional mobility needed for reaching tasks.    Time 6   Period Weeks   Status On-going           OT Long Term Goals - 05/26/16 1025      OT LONG TERM GOAL #1   Title Patient will return to highest level of independence with all daily tasks using LUE.   Time 12   Period Weeks   Status On-going  OT LONG TERM GOAL #2   Title Patient will increase LUE strength to 4/5 to increase ability to lift normal household items.   Time 12   Period Weeks   Status On-going     OT LONG TERM GOAL #3   Title Patient will increase RUE A/ROM to WNL to increase ability to complete reaching tasks overhead.   Time 12   Period Weeks   Status On-going     OT LONG TERM GOAL #4   Title Patient will decrease fascial restrictions to min amount or less to increase functional mobility needed for overhead reaching tasks.    Time 12   Period Weeks   Status On-going     OT LONG TERM GOAL #5   Title Patient will decrease pain level to 2/10 or less when completing daily tasks.   Time 12   Period Weeks   Status On-going               Plan - 06/16/16 1130    Clinical Impression Statement A: Pt progressed to next level of protocol per MD. Patient is to begin weaning of sling, He is now able to complete some supine AA/ROM, Abduction is now ok. Patient completed all new progressions with VC for form and technique. No report of pain.   Plan P: Add isometric flexion, abduction, and er.       Patient will benefit from skilled therapeutic intervention in order to improve the following deficits and impairments:  Pain, Impaired UE functional use, Increased fascial restricitons, Decreased range of motion, Increased edema  Visit Diagnosis: Other symptoms and signs involving the musculoskeletal system  Stiffness of left shoulder, not elsewhere classified  Acute pain of left shoulder    Problem List Patient Active  Problem List   Diagnosis Date Noted  . Depression 03/10/2016  . Right arm pain 10/31/2014  . Overuse injury 10/31/2014  . Cervical disc disorder with radiculopathy of cervical region 10/31/2014  . IBS (irritable bowel syndrome) 10/19/2013  . Back pain 01/02/2013  . Hyperlipidemia LDL goal <130 09/20/2012  . Essential hypertension, benign 09/20/2012  . GERD (gastroesophageal reflux disease) 12/30/2011  . GRANULOMA 08/27/2009  . Dysphagia 06/17/2009  . PROSTATITIS, HX OF 06/17/2009   Ailene Ravel, OTR/L,CBIS  [phone]  06/16/2016, 11:35 AM  [place] [address] [place], [place], [address] Phone: [phone]   Fax:  [phone]  Name: Adam [name] MRN: [id number] Date of Birth: 03-17-1951

## 2016-06-18 ENCOUNTER — Ambulatory Visit (HOSPITAL_COMMUNITY): Payer: BLUE CROSS/BLUE SHIELD | Admitting: Specialist

## 2016-06-18 DIAGNOSIS — R29898 Other symptoms and signs involving the musculoskeletal system: Secondary | ICD-10-CM

## 2016-06-18 DIAGNOSIS — M25612 Stiffness of left shoulder, not elsewhere classified: Secondary | ICD-10-CM | POA: Diagnosis not present

## 2016-06-18 DIAGNOSIS — M25512 Pain in left shoulder: Secondary | ICD-10-CM

## 2016-06-18 NOTE — Therapy (Signed)
[place], [place], [address] Phone: [phone]   Fax:  [phone]  Occupational Therapy Treatment  Patient Details  Name: Adam [name] MRN: [id number] Date of Birth: 1950/12/12 Referring Provider: Earlie Server, MD  Encounter Date: 06/18/2016      OT End of Session - 06/18/16 1008    Visit Number 12   Number of Visits 24   Date for OT Re-Evaluation 07/29/16  mini reassess on 07/10/16   Authorization Type 1) Medicare 2) BSBC   Authorization Time Period before 19th visit   Authorization - Visit Number 12   Authorization - Number of Visits 19   OT Start Time 0913   OT Stop Time 0955   OT Time Calculation (min) 42 min   Activity Tolerance Patient tolerated treatment well   Behavior During Therapy [facility] for tasks assessed/performed      Past Medical History:  Diagnosis Date  . Allergy   . BMI 31.0-31.9,adult JUNE 2011 180 LBS   . Chronic back pain   . Depression   . Eosinophilic granuloma ([place]) JAN 2011 GASTRIC, followed by Dr. Eugenia Pancoast   EGD JAN 2011, JUN 2011, NOV 2011  . Gastric ulcer 08/31/2012  . GERD (gastroesophageal reflux disease)   . HTN (hypertension)   . Hyperlipemia   . IBS (irritable bowel syndrome)   . Prostate hypertrophy     Past Surgical History:  Procedure Laterality Date  . BACK SURGERY    . COLONOSCOPY  JAN 2011 COMPLETE   hypoTN and bradycardia w/ Demerol ,Phenergan and Versed, [place] TICS, SML IH  . COLONOSCOPY  2003   NUR-normal  . COLONOSCOPY  06/21/09   Fields-sigmoid diverticulosis, sm int hemorrhoids  . COLONOSCOPY N/A 10/24/2013   Normal mucosa in the terminal ileum Moderate diverticulosis noted in the sigmoid colonModerate sized internal hemorrhoids. negative random colon bx.. next TCS 10/2023  . Cyst Removed     from Left leg and knee  . ESOPHAGOGASTRODUODENOSCOPY  05/20/2012   SLF: MILD ESOPHAGITIS.NO BARRETT'S/Multiple ulcers ranging between 3-5 mm/MILD Duodenal  inflammation was found in the duodenal bulb  . ESOPHAGOGASTRODUODENOSCOPY N/A 09/05/2012   SLF: MILD Non-erosive gastritis (inflammation) in the gastric antrum. Biopsies benign, no H. pylori.  . ESOPHAGOGASTRODUODENOSCOPY N/A 10/24/2013   [id number] DISTAL ESOPHAGEAL WEBSmall hiatal herniaMILD Non-erosive gastritis. SB bx benign. gastric bx, minimal inflammation.   Marland Kitchen GANGLION CYST EXCISION     from L wrist and pins placed for Fx involving L thumb  . HAND SURGERY  at the age of 5   Brother accidentally cut it  . MALONEY DILATION N/A 10/24/2013   Procedure: MALONEY DILATION;  Surgeon: Danie Binder, MD;  [facility];  Service: Endoscopy;  Laterality: N/A;  . MASS EXCISION  04/29/2012   Procedure: EXCISION MASS;  Surgeon: Donato Heinz, MD;  Location: [facility] ORS;  Service: General;  Laterality: Right;  Excision of Vascular Mass Right Arm  . SAVORY DILATION N/A 10/24/2013   Procedure: SAVORY DILATION;  Surgeon: Danie Binder, MD;  [facility];  Service: Endoscopy;  Laterality: N/A;  . TUMOR REMOVAL  1994   Benign from brain at [facility]  . UPPER GASTROINTESTINAL ENDOSCOPY  JAN 2011   GASTRIC ULCER-EOS GRANULOMA  . UPPER GASTROINTESTINAL ENDOSCOPY  JUN 2011   GASTRIC ULCER- EOS, NO H. PYLORI  . UPPER GASTROINTESTINAL ENDOSCOPY  NOV 2011   GASTRIC NODULE, no ulcer- EOS    There were no  vitals filed for this visit.      Subjective Assessment - 06/18/16 1008    Subjective  S:  I have been feeling good.     Currently in Pain? No/denies            [facility] OT Assessment - 06/18/16 0001      Assessment   Diagnosis Left shoulder RTC repair     Precautions   Precautions Shoulder   Type of Shoulder Precautions Non-agressive RTC Repair Protocol. Per Dr. French Ana No abduction for 6 weeks. Week 0-5 (06/10/16): P/ROM within following limits: Flexion (no limits), er 0-30, IR (no limits). pendulums, wrist A/ROM, scapular A/ROM. In 2 weeks (05/20/16) Begin glides and  scapular distraction. Week 5-6 (06/17/16): P/ROM to following limits: er (0-45); Abduction A/ROM prone shoulder extension to neutral, start isometric IR and extension. Week 6-7 (06/24/16): ROM limits: er (0-80) and Abduction (80 degrees). All isometrics, scapular strengthening with theraband. Week 7-8 (07/01/16): No limits for ROM. AA/ROM exercises standing. Week 8-9 (07/08/16): UBE. Prone PREs. A/ROM exercies. Week 9+: strengthening exercises.    Shoulder Interventions Shoulder sling/immobilizer;Off for dressing/bathing/exercises;At all times                  OT Treatments/Exercises (OP) - 06/18/16 0001      Exercises   Exercises Shoulder     Shoulder Exercises: Supine   Protraction PROM;AAROM;10 reps   Horizontal ABduction PROM;10 reps   External Rotation PROM;AAROM;10 reps   Internal Rotation PROM;AAROM;10 reps   Flexion PROM;AAROM;10 reps   ABduction PROM;AAROM;10 reps;Limitations   Shoulder ABduction Weight (lbs) completed abduction with therapist assist and dowel, patient positioned with elbow flexed to 90 and only ranged to 90 abduction    Other Supine Exercises Serratus Anterior Punch; 15X     Shoulder Exercises: Seated   Elevation AROM;20 reps   Extension AROM;20 reps;Theraband;10 reps   Theraband Level (Shoulder Extension) Level 1 (Yellow)   Retraction Theraband;10 reps;Limitations   Theraband Level (Shoulder Retraction) Level 1 (Yellow)   Retraction Limitations 50% between row and retraction for position   External Rotation Theraband;10 reps;Limitations   Theraband Level (Shoulder External Rotation) Level 1 (Yellow)   External Rotation Limitations towel roll under axilla   Internal Rotation Theraband;10 reps;Limitations   Theraband Level (Shoulder Internal Rotation) Level 1 (Yellow)   Internal Rotation Limitations towel roll under axilla    Other Seated Exercises elbow flexion PRE with 1# 15 times, elbow extension kickback with shoulder positioned in extension 15  times with 1#     Shoulder Exercises: Isometric Strengthening   Flexion Supine;5X5"   Extension Supine;5X5"   External Rotation Supine;5X5"   Internal Rotation Supine;5X5"   ABduction Supine;5X5"   ADduction Supine;5X5"     Manual Therapy   Manual Therapy Myofascial release   Manual therapy comments Manual therapy completed prior to exercises   Myofascial Release myofascial release and manual stretching completed to left upper arm, trapeziu, and scapularis region to decrease fascial restrctions and increase joint                   OT Short Term Goals - 05/26/16 1025      OT SHORT TERM GOAL #1   Title Patient will be educated and independent with HEP to increase functional use of LUE during daily tasks.    Time 6   Period Weeks   Status On-going     OT SHORT TERM GOAL #2   Title Patient will increase P/ROM to WNL to increase  ability to get shirts on and off easier.    Time 6   Period Weeks   Status On-going     OT SHORT TERM GOAL #3   Title Patient will decrease pain to 5/10 when completing daily tasks with LUE.    Time 6   Period Weeks   Status On-going     OT SHORT TERM GOAL #4   Title Patient will increase strength to 3+/5 to increase ability to complete daily tasks at waist level.   Time 6   Period Weeks   Status On-going     OT SHORT TERM GOAL #5   Title Patient will decrease fascial restrictions to Mod amount in LUE to increase functional mobility needed for reaching tasks.    Time 6   Period Weeks   Status On-going           OT Long Term Goals - 05/26/16 1025      OT LONG TERM GOAL #1   Title Patient will return to highest level of independence with all daily tasks using LUE.   Time 12   Period Weeks   Status On-going     OT LONG TERM GOAL #2   Title Patient will increase LUE strength to 4/5 to increase ability to lift normal household items.   Time 12   Period Weeks   Status On-going     OT LONG TERM GOAL #3   Title Patient will  increase RUE A/ROM to WNL to increase ability to complete reaching tasks overhead.   Time 12   Period Weeks   Status On-going     OT LONG TERM GOAL #4   Title Patient will decrease fascial restrictions to min amount or less to increase functional mobility needed for overhead reaching tasks.    Time 12   Period Weeks   Status On-going     OT LONG TERM GOAL #5   Title Patient will decrease pain level to 2/10 or less when completing daily tasks.   Time 12   Period Weeks   Status On-going               Plan - 06/18/16 1009    Clinical Impression Statement A:  Patient completing next level protocol exercises well.  Completed abduction AA/ROM for the first time this date, with elbow flexed to 90 and ranged only to 90 degrees Abduction.     Plan P:  Complete theraband with yellow theraband for external rotation, internal rotation, retraction, extension and issue as HEP.       Patient will benefit from skilled therapeutic intervention in order to improve the following deficits and impairments:  Pain, Impaired UE functional use, Increased fascial restricitons, Decreased range of motion, Increased edema  Visit Diagnosis: Other symptoms and signs involving the musculoskeletal system  Stiffness of left shoulder, not elsewhere classified  Acute pain of left shoulder    Problem List Patient Active Problem List   Diagnosis Date Noted  . Depression 03/10/2016  . Right arm pain 10/31/2014  . Overuse injury 10/31/2014  . Cervical disc disorder with radiculopathy of cervical region 10/31/2014  . IBS (irritable bowel syndrome) 10/19/2013  . Back pain 01/02/2013  . Hyperlipidemia LDL goal <130 09/20/2012  . Essential hypertension, benign 09/20/2012  . GERD (gastroesophageal reflux disease) 12/30/2011  . GRANULOMA 08/27/2009  . Dysphagia 06/17/2009  . PROSTATITIS, HX OF 06/17/2009    Vangie Bicker, [place], OTR/L [phone]  06/18/2016, 10:16 AM  [place]  [facility] [place], [place], [address] Phone: [phone]   Fax:  [phone]  Name: AVRAM MIHELIC MRN: [id number] Date of Birth: 1951/05/21

## 2016-06-23 ENCOUNTER — Encounter (HOSPITAL_COMMUNITY): Payer: Self-pay

## 2016-06-23 ENCOUNTER — Ambulatory Visit (HOSPITAL_COMMUNITY): Payer: BLUE CROSS/BLUE SHIELD

## 2016-06-23 DIAGNOSIS — M25612 Stiffness of left shoulder, not elsewhere classified: Secondary | ICD-10-CM | POA: Diagnosis not present

## 2016-06-23 DIAGNOSIS — M25512 Pain in left shoulder: Secondary | ICD-10-CM

## 2016-06-23 DIAGNOSIS — R29898 Other symptoms and signs involving the musculoskeletal system: Secondary | ICD-10-CM | POA: Diagnosis not present

## 2016-06-23 NOTE — Therapy (Signed)
[place], [place], [address] Phone: [phone]   Fax:  [phone]  Occupational Therapy Treatment  Patient Details  Name: Adam [name] MRN: [id number] Date of Birth: Jun 26, 1950 Referring Provider: Earlie Server, MD  Encounter Date: 06/23/2016      OT End of Session - 06/23/16 1032    Visit Number 13   Number of Visits 24   Date for OT Re-Evaluation 07/29/16  mini reassess on 07/10/16   Authorization Type 1) Medicare 2) BSBC   Authorization Time Period before 19th visit   Authorization - Visit Number 13   Authorization - Number of Visits 19   OT Start Time [id number]   OT Stop Time 1030   OT Time Calculation (min) 42 min   Activity Tolerance Patient tolerated treatment well   Behavior During Therapy [facility] for tasks assessed/performed      Past Medical History:  Diagnosis Date  . Allergy   . BMI 31.0-31.9,adult JUNE 2011 180 LBS   . Chronic back pain   . Depression   . Eosinophilic granuloma ([place]) JAN 2011 GASTRIC, followed by Dr. Eugenia Pancoast   EGD JAN 2011, JUN 2011, NOV 2011  . Gastric ulcer 08/31/2012  . GERD (gastroesophageal reflux disease)   . HTN (hypertension)   . Hyperlipemia   . IBS (irritable bowel syndrome)   . Prostate hypertrophy     Past Surgical History:  Procedure Laterality Date  . BACK SURGERY    . COLONOSCOPY  JAN 2011 COMPLETE   hypoTN and bradycardia w/ Demerol ,Phenergan and Versed,  TICS, SML IH  . COLONOSCOPY  2003   NUR-normal  . COLONOSCOPY  06/21/09   Fields-sigmoid diverticulosis, sm int hemorrhoids  . COLONOSCOPY N/A 10/24/2013   Normal mucosa in the terminal ileum Moderate diverticulosis noted in the sigmoid colonModerate sized internal hemorrhoids. negative random colon bx.. next TCS 10/2023  . Cyst Removed     from Left leg and knee  . ESOPHAGOGASTRODUODENOSCOPY  05/20/2012   SLF: MILD ESOPHAGITIS.NO BARRETT'S/Multiple ulcers ranging between 3-5 mm/MILD Duodenal  inflammation was found in the duodenal bulb  . ESOPHAGOGASTRODUODENOSCOPY N/A 09/05/2012   SLF: MILD Non-erosive gastritis (inflammation) in the gastric antrum. Biopsies benign, no H. pylori.  . ESOPHAGOGASTRODUODENOSCOPY N/A 10/24/2013   [id number] DISTAL ESOPHAGEAL WEBSmall hiatal herniaMILD Non-erosive gastritis. SB bx benign. gastric bx, minimal inflammation.   Marland Kitchen GANGLION CYST EXCISION     from L wrist and pins placed for Fx involving L thumb  . HAND SURGERY  at the age of 46   Brother accidentally cut it  . MALONEY DILATION N/A 10/24/2013   Procedure: MALONEY DILATION;  Surgeon: Danie Binder, MD;  [facility];  Service: Endoscopy;  Laterality: N/A;  . MASS EXCISION  04/29/2012   Procedure: EXCISION MASS;  Surgeon: Donato Heinz, MD;  Location: [facility];  Service: General;  Laterality: Right;  Excision of Vascular Mass Right Arm  . SAVORY DILATION N/A 10/24/2013   Procedure: SAVORY DILATION;  Surgeon: Danie Binder, MD;  [facility];  Service: Endoscopy;  Laterality: N/A;  . TUMOR REMOVAL  1994   Benign [facility] brain at [facility]  . UPPER GASTROINTESTINAL ENDOSCOPY  JAN 2011   GASTRIC ULCER-EOS GRANULOMA  . UPPER GASTROINTESTINAL ENDOSCOPY  JUN 2011   GASTRIC ULCER- EOS, NO H. PYLORI  . UPPER GASTROINTESTINAL ENDOSCOPY  NOV 2011   GASTRIC NODULE, no ulcer- EOS    There were no  vitals filed for this visit.      Subjective Assessment - 06/23/16 1010    Subjective  S: I get to start driving next week.   Currently in Pain? No/denies                      OT Treatments/Exercises (OP) - 06/23/16 1010      Exercises   Exercises Shoulder     Shoulder Exercises: Supine   Protraction PROM;5 reps;AAROM;15 reps   Horizontal ABduction PROM;5 reps;AAROM;15 reps   External Rotation PROM;5 reps;AAROM;15 reps   Internal Rotation PROM;5 reps;AAROM;15 reps   Flexion PROM;5 reps;AAROM;15 reps   ABduction PROM;5 reps   Other Supine Exercises  Serratus Anterior Punch; 15X     Shoulder Exercises: Prone   Extension AROM;15 reps     Shoulder Exercises: Standing   External Rotation AAROM;15 reps;Theraband;12 reps   Theraband Level (Shoulder External Rotation) Level 1 (Yellow)   Internal Rotation AAROM;15 reps;Theraband;12 reps   Theraband Level (Shoulder Internal Rotation) Level 1 (Yellow)   Flexion AAROM;15 reps   ABduction AAROM;15 reps   Extension Theraband;12 reps   Theraband Level (Shoulder Extension) Level 2 (Red)   Row Theraband;12 reps   Theraband Level (Shoulder Row) Level 2 (Red)   Retraction Theraband;12 reps   Theraband Level (Shoulder Retraction) Level 2 (Red)                OT Education - 06/23/16 1031    Education provided Yes   Education Details Patient was given yellow theraband to complete internal and external rotation.    Person(s) Educated Patient   Methods Demonstration;Explanation;Handout;Verbal cues   Comprehension Returned demonstration;Verbalized understanding          OT Short Term Goals - 05/26/16 1025      OT SHORT TERM GOAL #1   Title Patient will be educated and independent with HEP to increase functional use of LUE during daily tasks.    Time 6   Period Weeks   Status On-going     OT SHORT TERM GOAL #2   Title Patient will increase P/ROM to WNL to increase ability to get shirts on and off easier.    Time 6   Period Weeks   Status On-going     OT SHORT TERM GOAL #3   Title Patient will decrease pain to 5/10 when completing daily tasks with LUE.    Time 6   Period Weeks   Status On-going     OT SHORT TERM GOAL #4   Title Patient will increase strength to 3+/5 to increase ability to complete daily tasks at waist level.   Time 6   Period Weeks   Status On-going     OT SHORT TERM GOAL #5   Title Patient will decrease fascial restrictions to Mod amount in LUE to increase functional mobility needed for reaching tasks.    Time 6   Period Weeks   Status On-going            OT Long Term Goals - 05/26/16 1025      OT LONG TERM GOAL #1   Title Patient will return to highest level of independence with all daily tasks using LUE.   Time 12   Period Weeks   Status On-going     OT LONG TERM GOAL #2   Title Patient will increase LUE strength to 4/5 to increase ability to lift normal household items.   Time 12   Period  Weeks   Status On-going     OT LONG TERM GOAL #3   Title Patient will increase RUE A/ROM to WNL to increase ability to complete reaching tasks overhead.   Time 12   Period Weeks   Status On-going     OT LONG TERM GOAL #4   Title Patient will decrease fascial restrictions to min amount or less to increase functional mobility needed for overhead reaching tasks.    Time 12   Period Weeks   Status On-going     OT LONG TERM GOAL #5   Title Patient will decrease pain level to 2/10 or less when completing daily tasks.   Time 12   Period Weeks   Status On-going               Plan - 06/23/16 1032    Clinical Impression Statement A: Patient was progressed to AA/ROM standing exercises. Updated HEP to include IR/er with yellow theraband. Pt continues to require intermittent VC for form and technique.   Plan P: follow up on HEP. Continue to follow protocol for week 7 post op. Add wall wash.       Patient will benefit from skilled therapeutic intervention in order to improve the following deficits and impairments:  Pain, Impaired UE functional use, Increased fascial restricitons, Decreased range of motion, Increased edema  Visit Diagnosis: Other symptoms and signs involving the musculoskeletal system  Stiffness of left shoulder, not elsewhere classified  Acute pain of left shoulder    Problem List Patient Active Problem List   Diagnosis Date Noted  . Depression 03/10/2016  . Right arm pain 10/31/2014  . Overuse injury 10/31/2014  . Cervical disc disorder with radiculopathy of cervical region 10/31/2014  . IBS  (irritable bowel syndrome) 10/19/2013  . Back pain 01/02/2013  . Hyperlipidemia LDL goal <130 09/20/2012  . Essential hypertension, benign 09/20/2012  . GERD (gastroesophageal reflux disease) 12/30/2011  . GRANULOMA 08/27/2009  . Dysphagia 06/17/2009  . PROSTATITIS, HX OF 06/17/2009   Ailene Ravel, OTR/L,CBIS  [phone]  06/23/2016, 10:34 AM  [place] [address] [place], [place], [address] Phone: [phone]   Fax:  [phone]  Name: Adam [name] MRN: [id number] Date of Birth: 1950/06/24

## 2016-06-23 NOTE — Patient Instructions (Signed)
   4) Internal & External Rotation - Use yellow theraband for now.    Stand with elbows at the side and elbows bent 90 degrees. Move your forearms away from your body, then bring back inward toward the body. Hold a towel in between your side and your elbow.        Repeat all exercises 10-15 times, 1-2 times per day.

## 2016-06-25 ENCOUNTER — Encounter (HOSPITAL_COMMUNITY): Payer: Medicare Other

## 2016-06-30 ENCOUNTER — Encounter (HOSPITAL_COMMUNITY): Payer: Self-pay

## 2016-06-30 ENCOUNTER — Ambulatory Visit (HOSPITAL_COMMUNITY): Payer: BLUE CROSS/BLUE SHIELD

## 2016-06-30 DIAGNOSIS — M25612 Stiffness of left shoulder, not elsewhere classified: Secondary | ICD-10-CM | POA: Diagnosis not present

## 2016-06-30 DIAGNOSIS — R29898 Other symptoms and signs involving the musculoskeletal system: Secondary | ICD-10-CM | POA: Diagnosis not present

## 2016-06-30 DIAGNOSIS — M25512 Pain in left shoulder: Secondary | ICD-10-CM | POA: Diagnosis not present

## 2016-06-30 NOTE — Patient Instructions (Signed)
EXTERNAL ROTATION STRETCH  Place arm along edge of door as pictured.  Keep elbow and shoulder at 90 degree angles.  Gently lean the upper torso forward until a stretch is felt in the shoulder. Hold _10__ seconds; relax; repeat 2 times    Wall Slide Shoulder Flexion  Stand facing wall, walk fingers up the wall until you feel a good stretch. Hold for 10 seconds. Slowly walk your fingers back down the wall. Hold for 10 seconds and repeat 2 times.    1) Shoulder Protraction    Begin with elbows by your side, slowly "punch" straight out in front of you keeping arms/elbows straight.   10-15 reps   4) Internal & External Rotation    Perform each exercise ____10-15____ reps. 2-3x days.     Shoulder FLEXION - STANDING - PALMS DOWN  In the standing position, hold a wand/cane with both arms, palms up on both sides. Raise up the wand/cane allowing your unaffected arm to perform most of the effort. Your affected arm should be partially relaxed.       Shoulder ABDUCTION - STANDING  While holding a wand/cane palm face up on the injured side and palm face down on the uninjured side, slowly raise up your injured arm to the side.           Horizontal Abduction/Adduction      Straight arms holding cane at shoulder height, bring cane to right, center, left. Repeat starting to left.   Copyright  VHI. All rights reserved.        *No band* -Stand with elbows at the side and elbows bent 90 degrees. Move your forearms away from your body, then bring back inward toward the body.

## 2016-06-30 NOTE — Therapy (Signed)
[place], [place], [address] Phone: [phone]   Fax:  [phone]  Occupational Therapy Treatment  Patient Details  Name: Adam [name] MRN: [id number] Date of Birth: 1951/01/29 Referring Provider: Earlie Server, MD  Encounter Date: 06/30/2016      OT End of Session - 06/30/16 1135    Visit Number 14   Number of Visits 24   Date for OT Re-Evaluation 07/29/16  mini reassess on 07/10/16   Authorization Type 1) Medicare 2) BSBC   Authorization Time Period before 19th visit   Authorization - Visit Number 68   Authorization - Number of Visits 19   OT Start Time [phone]   OT Stop Time 1030   OT Time Calculation (min) 41 min   Activity Tolerance Patient tolerated treatment well   Behavior During Therapy [facility] for tasks assessed/performed      Past Medical History:  Diagnosis Date  . Allergy   . BMI 31.0-31.9,adult JUNE 2011 180 LBS   . Chronic back pain   . Depression   . Eosinophilic granuloma ([place]) JAN 2011 GASTRIC, followed by Dr. Eugenia Pancoast   EGD JAN 2011, JUN 2011, NOV 2011  . Gastric ulcer 08/31/2012  . GERD (gastroesophageal reflux disease)   . HTN (hypertension)   . Hyperlipemia   . IBS (irritable bowel syndrome)   . Prostate hypertrophy     Past Surgical History:  Procedure Laterality Date  . BACK SURGERY    . COLONOSCOPY  JAN 2011 COMPLETE   hypoTN and bradycardia w/ Demerol ,Phenergan and Versed, [place] TICS, SML IH  . COLONOSCOPY  2003   NUR-normal  . COLONOSCOPY  06/21/09   Fields-sigmoid diverticulosis, sm int hemorrhoids  . COLONOSCOPY N/A 10/24/2013   Normal mucosa in the terminal ileum Moderate diverticulosis noted in the sigmoid colonModerate sized internal hemorrhoids. negative random colon bx.. next TCS 10/2023  . Cyst Removed     from Left leg and knee  . ESOPHAGOGASTRODUODENOSCOPY  05/20/2012   SLF: MILD ESOPHAGITIS.NO BARRETT'S/Multiple ulcers ranging between 3-5 mm/MILD Duodenal  inflammation was found in the duodenal bulb  . ESOPHAGOGASTRODUODENOSCOPY N/A 09/05/2012   SLF: MILD Non-erosive gastritis (inflammation) in the gastric antrum. Biopsies benign, no H. pylori.  . ESOPHAGOGASTRODUODENOSCOPY N/A 10/24/2013   [id number] DISTAL ESOPHAGEAL WEBSmall hiatal herniaMILD Non-erosive gastritis. SB bx benign. gastric bx, minimal inflammation.   Marland Kitchen GANGLION CYST EXCISION     from L wrist and pins placed for Fx involving L thumb  . HAND SURGERY  at the age of 51   Brother accidentally cut it  . MALONEY DILATION N/A 10/24/2013   Procedure: MALONEY DILATION;  Surgeon: Danie Binder, MD;  [facility];  Service: Endoscopy;  Laterality: N/A;  . MASS EXCISION  04/29/2012   Procedure: EXCISION MASS;  Surgeon: Donato Heinz, MD;  Location: [facility] OR[facility];  Service: General;  Laterality: Right;  Excision of Vascular Mass Right Arm  . SAVORY DILATION N/A 10/24/2013   Procedure: SAVORY DILATION;  Surgeon: Danie Binder, MD;  [facility];  Service: Endoscopy;  Laterality: N/A;  . TUMOR REMOVAL  1994   Benign [facility] brain at [facility]  . UPPER GASTROINTESTINAL ENDOSCOPY  JAN 2011   GASTRIC ULCER-EOS GRANULOMA  . UPPER GASTROINTESTINAL ENDOSCOPY  JUN 2011   GASTRIC ULCER- EOS, NO H. PYLORI  . UPPER GASTROINTESTINAL ENDOSCOPY  NOV 2011   GASTRIC NODULE, no ulcer- EOS    There were no  vitals filed for this visit.      Subjective Assessment - 06/30/16 1006    Subjective  S: Nothing to report.   Currently in Pain? No/denies            [facility] OT Assessment - 06/30/16 1007      Assessment   Diagnosis Left shoulder RTC repair     Precautions   Precautions Shoulder   Type of Shoulder Precautions Non-agressive RTC Repair Protocol. Per Dr. French Ana No abduction for 6 weeks. Week 0-5 (06/10/16): P/ROM within following limits: Flexion (no limits), er 0-30, IR (no limits). pendulums, wrist A/ROM, scapular A/ROM. In 2 weeks (05/20/16) Begin glides and scapular  distraction. Week 5-6 (06/17/16): P/ROM to following limits: er (0-45); Abduction A/ROM prone shoulder extension to neutral, start isometric IR and extension. Week 6-7 (06/24/16): ROM limits: er (0-80) and Abduction (80 degrees). All isometrics, scapular strengthening with theraband. Week 7-8 (07/01/16): No limits for ROM. AA/ROM exercises standing. Week 8-9 (07/08/16): UBE. Prone PREs. A/ROM exercies. Week 9+: strengthening exercises.                   OT Treatments/Exercises (OP) - 06/30/16 1007      Exercises   Exercises Shoulder     Shoulder Exercises: Supine   Protraction PROM;5 reps;AROM;10 reps   Horizontal ABduction PROM;5 reps;AROM;10 reps   External Rotation PROM;5 reps;AROM;10 reps   Internal Rotation PROM;5 reps;AROM;10 reps   Flexion PROM;5 reps;AROM;10 reps   ABduction PROM;5 reps;AROM;10 reps     Shoulder Exercises: Prone   Retraction AROM;10 reps   Flexion AROM;10 reps   Extension AROM;10 reps   Horizontal ABduction 1 AROM;10 reps   Horizontal ABduction 2 AROM;10 reps     Shoulder Exercises: Standing   Protraction AROM;10 reps   Horizontal ABduction AAROM;10 reps   External Rotation AROM;10 reps   Internal Rotation AROM;10 reps   Flexion AAROM;10 reps   ABduction AROM;10 reps     Shoulder Exercises: ROM/Strengthening   UBE (Upper Arm Bike) level 1 2' reverse 2' forward                OT Education - 06/30/16 1134    Education provided Yes   Education Details A/ROM and AA/ROM exercises standing. external rotation and flexion stretch   Person(s) Educated Patient   Methods Explanation;Demonstration;Verbal cues;Handout   Comprehension Returned demonstration;Verbalized understanding          OT Short Term Goals - 05/26/16 1025      OT SHORT TERM GOAL #1   Title Patient will be educated and independent with HEP to increase functional use of LUE during daily tasks.    Time 6   Period Weeks   Status On-going     OT SHORT TERM GOAL #2    Title Patient will increase P/ROM to WNL to increase ability to get shirts on and off easier.    Time 6   Period Weeks   Status On-going     OT SHORT TERM GOAL #3   Title Patient will decrease pain to 5/10 when completing daily tasks with LUE.    Time 6   Period Weeks   Status On-going     OT SHORT TERM GOAL #4   Title Patient will increase strength to 3+/5 to increase ability to complete daily tasks at waist level.   Time 6   Period Weeks   Status On-going     OT SHORT TERM GOAL #5   Title Patient will  decrease fascial restrictions to Mod amount in LUE to increase functional mobility needed for reaching tasks.    Time 6   Period Weeks   Status On-going           OT Long Term Goals - 05/26/16 1025      OT LONG TERM GOAL #1   Title Patient will return to highest level of independence with all daily tasks using LUE.   Time 12   Period Weeks   Status On-going     OT LONG TERM GOAL #2   Title Patient will increase LUE strength to 4/5 to increase ability to lift normal household items.   Time 12   Period Weeks   Status On-going     OT LONG TERM GOAL #3   Title Patient will increase RUE A/ROM to WNL to increase ability to complete reaching tasks overhead.   Time 12   Period Weeks   Status On-going     OT LONG TERM GOAL #4   Title Patient will decrease fascial restrictions to min amount or less to increase functional mobility needed for overhead reaching tasks.    Time 12   Period Weeks   Status On-going     OT LONG TERM GOAL #5   Title Patient will decrease pain level to 2/10 or less when completing daily tasks.   Time 12   Period Weeks   Status On-going               Plan - 06/30/16 1135    Clinical Impression Statement A: Progressed patient to week 8 of protocol which includes progressing to A/ROM as able and UBE bike. patient had difficulty completing all A/ROM exercises standing and required dowel rod for flexion, abduction, and horizontal  abduction. VC for form and technique. It was noted that patient had swelling in his posterior deltoid region although had no reports of pain. Suggested patient use ice at home to decrease swelling.    Plan P: Myofascial release PRN. Follow up on HEP. Continue to follow protocol for week 8.       Patient will benefit from skilled therapeutic intervention in order to improve the following deficits and impairments:  Pain, Impaired UE functional use, Increased fascial restricitons, Decreased range of motion, Increased edema  Visit Diagnosis: Other symptoms and signs involving the musculoskeletal system  Stiffness of left shoulder, not elsewhere classified  Acute pain of left shoulder    Problem List Patient Active Problem List   Diagnosis Date Noted  . Depression 03/10/2016  . Right arm pain 10/31/2014  . Overuse injury 10/31/2014  . Cervical disc disorder with radiculopathy of cervical region 10/31/2014  . IBS (irritable bowel syndrome) 10/19/2013  . Back pain 01/02/2013  . Hyperlipidemia LDL goal <130 09/20/2012  . Essential hypertension, benign 09/20/2012  . GERD (gastroesophageal reflux disease) 12/30/2011  . GRANULOMA 08/27/2009  . Dysphagia 06/17/2009  . PROSTATITIS, HX OF 06/17/2009   Ailene Ravel, OTR/L,CBIS  [phone]  06/30/2016, 11:38 AM  [place] [address] [place], [place], [address] Phone: [phone]   Fax:  [phone]  Name: Adam [name] MRN: [id number] Date of Birth: September 11, 1950

## 2016-07-02 ENCOUNTER — Encounter (HOSPITAL_COMMUNITY): Payer: Self-pay

## 2016-07-02 ENCOUNTER — Ambulatory Visit (HOSPITAL_COMMUNITY): Payer: BLUE CROSS/BLUE SHIELD

## 2016-07-02 DIAGNOSIS — R29898 Other symptoms and signs involving the musculoskeletal system: Secondary | ICD-10-CM | POA: Diagnosis not present

## 2016-07-02 DIAGNOSIS — M25612 Stiffness of left shoulder, not elsewhere classified: Secondary | ICD-10-CM | POA: Diagnosis not present

## 2016-07-02 DIAGNOSIS — M25512 Pain in left shoulder: Secondary | ICD-10-CM

## 2016-07-02 NOTE — Therapy (Signed)
[place], [place], [address] Phone: [phone]   Fax:  [phone]  Occupational Therapy Treatment  Patient Details  Name: Adam [name] MRN: [id number] Date of Birth: 1951/01/27 Referring Provider: Earlie Server, MD  Encounter Date: 07/02/2016      OT End of Session - 07/02/16 1030    Visit Number 15   Number of Visits 24   Date for OT Re-Evaluation 07/29/16  mini reassess on 07/10/16   Authorization Type 1) Medicare 2) BSBC   Authorization Time Period before 19th visit   Authorization - Visit Number 58   Authorization - Number of Visits 19   OT Start Time 0945   OT Stop Time 1030   OT Time Calculation (min) 45 min   Activity Tolerance Patient tolerated treatment well   Behavior During Therapy [facility] for tasks assessed/performed      Past Medical History:  Diagnosis Date  . Allergy   . BMI 31.0-31.9,adult JUNE 2011 180 LBS   . Chronic back pain   . Depression   . Eosinophilic granuloma ([place]) JAN 2011 GASTRIC, followed by Dr. Eugenia Pancoast   EGD JAN 2011, JUN 2011, NOV 2011  . Gastric ulcer 08/31/2012  . GERD (gastroesophageal reflux disease)   . HTN (hypertension)   . Hyperlipemia   . IBS (irritable bowel syndrome)   . Prostate hypertrophy     Past Surgical History:  Procedure Laterality Date  . BACK SURGERY    . COLONOSCOPY  JAN 2011 COMPLETE   hypoTN and bradycardia w/ Demerol ,Phenergan and Versed, [place] TICS, SML IH  . COLONOSCOPY  2003   NUR-normal  . COLONOSCOPY  06/21/09   Fields-sigmoid diverticulosis, sm int hemorrhoids  . COLONOSCOPY N/A 10/24/2013   Normal mucosa in the terminal ileum Moderate diverticulosis noted in the sigmoid colonModerate sized internal hemorrhoids. negative random colon bx.. next TCS 10/2023  . Cyst Removed     from Left leg and knee  . ESOPHAGOGASTRODUODENOSCOPY  05/20/2012   SLF: MILD ESOPHAGITIS.NO BARRETT'S/Multiple ulcers ranging between 3-5 mm/MILD Duodenal  inflammation was found in the duodenal bulb  . ESOPHAGOGASTRODUODENOSCOPY N/A 09/05/2012   SLF: MILD Non-erosive gastritis (inflammation) in the gastric antrum. Biopsies benign, no H. pylori.  . ESOPHAGOGASTRODUODENOSCOPY N/A 10/24/2013   [id number] DISTAL ESOPHAGEAL WEBSmall hiatal herniaMILD Non-erosive gastritis. SB bx benign. gastric bx, minimal inflammation.   Marland Kitchen GANGLION CYST EXCISION     from L wrist and pins placed for Fx involving L thumb  . HAND SURGERY  at the age of 23   Brother accidentally cut it  . MALONEY DILATION N/A 10/24/2013   Procedure: MALONEY DILATION;  Surgeon: Danie Binder, MD;  [facility];  Service: Endoscopy;  Laterality: N/A;  . MASS EXCISION  04/29/2012   Procedure: EXCISION MASS;  Surgeon: Donato Heinz, MD;  Location: [facility];  Service: General;  Laterality: Right;  Excision of Vascular Mass Right Arm  . SAVORY DILATION N/A 10/24/2013   Procedure: SAVORY DILATION;  Surgeon: Danie Binder, MD;  [facility];  Service: Endoscopy;  Laterality: N/A;  . TUMOR REMOVAL  1994   Benign from brain at [facility]  . UPPER GASTROINTESTINAL ENDOSCOPY  JAN 2011   GASTRIC ULCER-EOS GRANULOMA  . UPPER GASTROINTESTINAL ENDOSCOPY  JUN 2011   GASTRIC ULCER- EOS, NO H. PYLORI  . UPPER GASTROINTESTINAL ENDOSCOPY  NOV 2011   GASTRIC NODULE, no ulcer- EOS    There were no  vitals filed for this visit.      Subjective Assessment - 07/02/16 0956    Subjective  S: Exercises are going good at home.    Currently in Pain? No/denies            [facility] OT Assessment - 07/02/16 0957      Assessment   Diagnosis Left shoulder RTC repair     Precautions   Precautions Shoulder   Type of Shoulder Precautions Non-agressive RTC Repair Protocol. Per Dr. French Ana No abduction for 6 weeks. Week 0-5 (06/10/16): P/ROM within following limits: Flexion (no limits), er 0-30, IR (no limits). pendulums, wrist A/ROM, scapular A/ROM. In 2 weeks (05/20/16) Begin glides  and scapular distraction. Week 5-6 (06/17/16): P/ROM to following limits: er (0-45); Abduction A/ROM prone shoulder extension to neutral, start isometric IR and extension. Week 6-7 (06/24/16): ROM limits: er (0-80) and Abduction (80 degrees). All isometrics, scapular strengthening with theraband. Week 7-8 (07/01/16): No limits for ROM. AA/ROM exercises standing. Week 8-9 (07/08/16): UBE. Prone PREs. A/ROM exercies. Week 9+: strengthening exercises.                   OT Treatments/Exercises (OP) - 07/02/16 0957      Exercises   Exercises Shoulder     Shoulder Exercises: Supine   Protraction PROM;5 reps;AROM;10 reps   Horizontal ABduction PROM;5 reps;AROM;10 reps   External Rotation PROM;5 reps;AROM;10 reps   Internal Rotation PROM;5 reps;AROM;10 reps   Flexion PROM;5 reps;AROM;10 reps   ABduction PROM;5 reps;AROM;10 reps     Shoulder Exercises: Prone   Retraction AROM;10 reps   Flexion AROM;10 reps   Extension AROM;10 reps   Horizontal ABduction 1 AROM;10 reps   Horizontal ABduction 2 AROM;10 reps     Shoulder Exercises: Standing   Protraction AROM;10 reps   Horizontal ABduction AAROM;10 reps   External Rotation AROM;10 reps   Internal Rotation AROM;10 reps   Flexion AAROM;10 reps   ABduction AROM;10 reps;Limitations   ABduction Limitations 50% range     Shoulder Exercises: ROM/Strengthening   UBE (Upper Arm Bike) level 1 2' reverse 2' forward   Proximal Shoulder Strengthening, Supine 10X no rest breaks   Proximal Shoulder Strengthening, Seated 10X no rest breaks     Manual Therapy   Manual Therapy Myofascial release   Manual therapy comments Manual therapy completed prior to exercises   Myofascial Release myofascial release and manual stretching completed to left upper arm, trapeziu, and scapularis region to decrease fascial restrctions and increase joint                   OT Short Term Goals - 05/26/16 1025      OT SHORT TERM GOAL #1   Title Patient  will be educated and independent with HEP to increase functional use of LUE during daily tasks.    Time 6   Period Weeks   Status On-going     OT SHORT TERM GOAL #2   Title Patient will increase P/ROM to WNL to increase ability to get shirts on and off easier.    Time 6   Period Weeks   Status On-going     OT SHORT TERM GOAL #3   Title Patient will decrease pain to 5/10 when completing daily tasks with LUE.    Time 6   Period Weeks   Status On-going     OT SHORT TERM GOAL #4   Title Patient will increase strength to 3+/5 to increase ability to complete  daily tasks at waist level.   Time 6   Period Weeks   Status On-going     OT SHORT TERM GOAL #5   Title Patient will decrease fascial restrictions to Mod amount in LUE to increase functional mobility needed for reaching tasks.    Time 6   Period Weeks   Status On-going           OT Long Term Goals - 05/26/16 1025      OT LONG TERM GOAL #1   Title Patient will return to highest level of independence with all daily tasks using LUE.   Time 12   Period Weeks   Status On-going     OT LONG TERM GOAL #2   Title Patient will increase LUE strength to 4/5 to increase ability to lift normal household items.   Time 12   Period Weeks   Status On-going     OT LONG TERM GOAL #3   Title Patient will increase RUE A/ROM to WNL to increase ability to complete reaching tasks overhead.   Time 12   Period Weeks   Status On-going     OT LONG TERM GOAL #4   Title Patient will decrease fascial restrictions to min amount or less to increase functional mobility needed for overhead reaching tasks.    Time 12   Period Weeks   Status On-going     OT LONG TERM GOAL #5   Title Patient will decrease pain level to 2/10 or less when completing daily tasks.   Time 12   Period Weeks   Status On-going               Plan - 07/02/16 1030    Clinical Impression Statement A: Continued with previous exercises focusing on form and  technique. Working towards completing all exercises at A/ROM. Pt continues to have swelling in his left shoulder although he does not report any pain. Patient states he is icing twice a day.   Plan P: follow up on swelling and complete Myofascial release PRN. Continue with following protocol for week 8.      Patient will benefit from skilled therapeutic intervention in order to improve the following deficits and impairments:  Pain, Impaired UE functional use, Increased fascial restricitons, Decreased range of motion, Increased edema  Visit Diagnosis: Other symptoms and signs involving the musculoskeletal system  Stiffness of left shoulder, not elsewhere classified  Acute pain of left shoulder    Problem List Patient Active Problem List   Diagnosis Date Noted  . Depression 03/10/2016  . Right arm pain 10/31/2014  . Overuse injury 10/31/2014  . Cervical disc disorder with radiculopathy of cervical region 10/31/2014  . IBS (irritable bowel syndrome) 10/19/2013  . Back pain 01/02/2013  . Hyperlipidemia LDL goal <130 09/20/2012  . Essential hypertension, benign 09/20/2012  . GERD (gastroesophageal reflux disease) 12/30/2011  . GRANULOMA 08/27/2009  . Dysphagia 06/17/2009  . PROSTATITIS, HX OF 06/17/2009   Ailene Ravel, OTR/L,CBIS  [phone]  07/02/2016, 10:35 AM  [place] [address] [place], [place], [address] Phone: [phone]   Fax:  [phone]  Name: BALTASAR VISALLI MRN: [id number] Date of Birth: 14-Feb-1951

## 2016-07-07 ENCOUNTER — Encounter (HOSPITAL_COMMUNITY): Payer: Self-pay

## 2016-07-07 ENCOUNTER — Ambulatory Visit (HOSPITAL_COMMUNITY): Payer: BLUE CROSS/BLUE SHIELD

## 2016-07-07 DIAGNOSIS — M25612 Stiffness of left shoulder, not elsewhere classified: Secondary | ICD-10-CM | POA: Diagnosis not present

## 2016-07-07 DIAGNOSIS — R29898 Other symptoms and signs involving the musculoskeletal system: Secondary | ICD-10-CM | POA: Diagnosis not present

## 2016-07-07 DIAGNOSIS — M25512 Pain in left shoulder: Secondary | ICD-10-CM | POA: Diagnosis not present

## 2016-07-07 NOTE — Therapy (Signed)
[place], [place], [address] Phone: [phone]   Fax:  [phone]  Occupational Therapy Treatment  Patient Details  Name: Adam [name] MRN: [id number] Date of Birth: [birth year] Referring Provider: Earlie Server, MD  Encounter Date: 07/07/2016      OT End of Session - 07/07/16 1021    Visit Number 16   Number of Visits 24   Date for OT Re-Evaluation 07/29/16  mini reassess on 07/10/16   Authorization Type 1) Medicare 2) BSBC   Authorization Time Period before 19th visit   Authorization - Visit Number 7   Authorization - Number of Visits 19   OT Start Time 0950   OT Stop Time 1030   OT Time Calculation (min) 40 min   Activity Tolerance Patient tolerated treatment well   Behavior During Therapy [facility] for tasks assessed/performed      Past Medical History:  Diagnosis Date  . Allergy   . BMI 31.0-31.9,adult JUNE 2011 180 LBS   . Chronic back pain   . Depression   . Eosinophilic granuloma ([place]) JAN 2011 GASTRIC, followed by Dr. Eugenia Pancoast   EGD JAN 2011, JUN 2011, NOV 2011  . Gastric ulcer 08/31/2012  . GERD (gastroesophageal reflux disease)   . HTN (hypertension)   . Hyperlipemia   . IBS (irritable bowel syndrome)   . Prostate hypertrophy     Past Surgical History:  Procedure Laterality Date  . BACK SURGERY    . COLONOSCOPY  JAN 2011 COMPLETE   hypoTN and bradycardia w/ Demerol ,Phenergan and Versed, [place] TICS, SML IH  . COLONOSCOPY  2003   NUR-normal  . COLONOSCOPY  06/21/09   Fields-sigmoid diverticulosis, sm int hemorrhoids  . COLONOSCOPY N/A 10/24/2013   Normal mucosa in the terminal ileum Moderate diverticulosis noted in the sigmoid colonModerate sized internal hemorrhoids. negative random colon bx.. next TCS 10/2023  . Cyst Removed     from Left leg and knee  . ESOPHAGOGASTRODUODENOSCOPY  05/20/2012   SLF: MILD ESOPHAGITIS.NO BARRETT'S/Multiple ulcers ranging between 3-5 mm/MILD Duodenal  inflammation was found in the duodenal bulb  . ESOPHAGOGASTRODUODENOSCOPY N/A 09/05/2012   SLF: MILD Non-erosive gastritis (inflammation) in the gastric antrum. Biopsies benign, no H. pylori.  . ESOPHAGOGASTRODUODENOSCOPY N/A 10/24/2013   [id number] DISTAL ESOPHAGEAL WEBSmall hiatal herniaMILD Non-erosive gastritis. SB bx benign. gastric bx, minimal inflammation.   Marland Kitchen GANGLION CYST EXCISION     from L wrist and pins placed for Fx involving L thumb  . HAND SURGERY  at the age of 41   Brother accidentally cut it  . MALONEY DILATION N/A 10/24/2013   Procedure: MALONEY DILATION;  Surgeon: Danie Binder, MD;  [facility];  Service: Endoscopy;  Laterality: N/A;  . MASS EXCISION  04/29/2012   Procedure: EXCISION MASS;  Surgeon: Donato Heinz, MD;  Location: [facility] ORS;  Service: General;  Laterality: Right;  Excision of Vascular Mass Right Arm  . SAVORY DILATION N/A 10/24/2013   Procedure: SAVORY DILATION;  Surgeon: Danie Binder, MD;  [facility];  Service: Endoscopy;  Laterality: N/A;  . TUMOR REMOVAL  1994   Benign from brain at [facility]  . UPPER GASTROINTESTINAL ENDOSCOPY  JAN 2011   GASTRIC ULCER-EOS GRANULOMA  . UPPER GASTROINTESTINAL ENDOSCOPY  JUN 2011   GASTRIC ULCER- EOS, NO H. PYLORI  . UPPER GASTROINTESTINAL ENDOSCOPY  NOV 2011   GASTRIC NODULE, no ulcer- EOS    There were no  vitals filed for this visit.      Subjective Assessment - 07/07/16 1006    Subjective  S: The swelling has gone down.   Currently in Pain? No/denies            [facility] OT Assessment - 07/07/16 1005      Assessment   Diagnosis Left shoulder RTC repair     Precautions   Precautions Shoulder   Type of Shoulder Precautions Non-agressive RTC Repair Protocol. Per Dr. French Ana No abduction for 6 weeks. Week 0-5 (06/10/16): P/ROM within following limits: Flexion (no limits), er 0-30, IR (no limits). pendulums, wrist A/ROM, scapular A/ROM. In 2 weeks (05/20/16) Begin glides and  scapular distraction. Week 5-6 (06/17/16): P/ROM to following limits: er (0-45); Abduction A/ROM prone shoulder extension to neutral, start isometric IR and extension. Week 6-7 (06/24/16): ROM limits: er (0-80) and Abduction (80 degrees). All isometrics, scapular strengthening with theraband. Week 7-8 (07/01/16): No limits for ROM. AA/ROM exercises standing. Week 8-9 (07/08/16): UBE. Prone PREs. A/ROM exercies. Week 9+: strengthening exercises.                   OT Treatments/Exercises (OP) - 07/07/16 1004      Exercises   Exercises Shoulder     Shoulder Exercises: Supine   Protraction PROM;5 reps;AROM;12 reps   Horizontal ABduction PROM;5 reps;AROM;12 reps   External Rotation PROM;5 reps;AROM;12 reps   Internal Rotation PROM;5 reps;AROM;12 reps   Flexion PROM;5 reps;AROM;12 reps   ABduction PROM;5 reps;AROM;12 reps     Shoulder Exercises: Standing   Protraction AROM;10 reps   Horizontal ABduction AAROM;10 reps   External Rotation AROM;10 reps   Internal Rotation AROM;10 reps   Flexion AAROM;10 reps   ABduction AROM;10 reps;Limitations   ABduction Limitations 50% range   Extension Theraband;12 reps   Theraband Level (Shoulder Extension) Level 2 (Red)   Row Theraband;12 reps   Theraband Level (Shoulder Row) Level 2 (Red)   Retraction Theraband;12 reps   Theraband Level (Shoulder Retraction) Level 2 (Red)     Shoulder Exercises: ROM/Strengthening   UBE (Upper Arm Bike) level 1 2' reverse 2' forward   Proximal Shoulder Strengthening, Supine 10X no rest breaks   Proximal Shoulder Strengthening, Seated 10X no rest breaks   Other ROM/Strengthening Exercises Finger wall walks with pink ball; shoulder flexion; 5X. Pt able to complete range to shoulder level only.     Manual Therapy   Manual Therapy Myofascial release   Manual therapy comments Manual therapy completed prior to exercises   Myofascial Release myofascial release and manual stretching completed to left upper arm,  trapeziu, and scapularis region to decrease fascial restrctions and increase joint                   OT Short Term Goals - 05/26/16 1025      OT SHORT TERM GOAL #1   Title Patient will be educated and independent with HEP to increase functional use of LUE during daily tasks.    Time 6   Period Weeks   Status On-going     OT SHORT TERM GOAL #2   Title Patient will increase P/ROM to WNL to increase ability to get shirts on and off easier.    Time 6   Period Weeks   Status On-going     OT SHORT TERM GOAL #3   Title Patient will decrease pain to 5/10 when completing daily tasks with LUE.    Time 6   Period Weeks  Status On-going     OT SHORT TERM GOAL #4   Title Patient will increase strength to 3+/5 to increase ability to complete daily tasks at waist level.   Time 6   Period Weeks   Status On-going     OT SHORT TERM GOAL #5   Title Patient will decrease fascial restrictions to Mod amount in LUE to increase functional mobility needed for reaching tasks.    Time 6   Period Weeks   Status On-going           OT Long Term Goals - 05/26/16 1025      OT LONG TERM GOAL #1   Title Patient will return to highest level of independence with all daily tasks using LUE.   Time 12   Period Weeks   Status On-going     OT LONG TERM GOAL #2   Title Patient will increase LUE strength to 4/5 to increase ability to lift normal household items.   Time 12   Period Weeks   Status On-going     OT LONG TERM GOAL #3   Title Patient will increase RUE A/ROM to WNL to increase ability to complete reaching tasks overhead.   Time 12   Period Weeks   Status On-going     OT LONG TERM GOAL #4   Title Patient will decrease fascial restrictions to min amount or less to increase functional mobility needed for overhead reaching tasks.    Time 12   Period Weeks   Status On-going     OT LONG TERM GOAL #5   Title Patient will decrease pain level to 2/10 or less when completing daily  tasks.   Time 12   Period Weeks   Status On-going               Plan - 07/07/16 1019    Clinical Impression Statement A: swelling has significantly gone down in left upper arm. Myofascial release was performed to decrease amount of swelling. Pt continues to require dowel rod for standing flexion and horizontal abduction. Added ball wall fuinger walks to focus on shoulder flexion ability.   Plan P: Add sidelying and prone exercises. Progress to Week 9 when able.  Add functional reaching task. Mini reassess.      Patient will benefit from skilled therapeutic intervention in order to improve the following deficits and impairments:  Pain, Impaired UE functional use, Increased fascial restricitons, Decreased range of motion, Increased edema  Visit Diagnosis: Other symptoms and signs involving the musculoskeletal system  Stiffness of left shoulder, not elsewhere classified  Acute pain of left shoulder    Problem List Patient Active Problem List   Diagnosis Date Noted  . Depression 03/10/2016  . Right arm pain 10/31/2014  . Overuse injury 10/31/2014  . Cervical disc disorder with radiculopathy of cervical region 10/31/2014  . IBS (irritable bowel syndrome) 10/19/2013  . Back pain 01/02/2013  . Hyperlipidemia LDL goal <130 09/20/2012  . Essential hypertension, benign 09/20/2012  . GERD (gastroesophageal reflux disease) 12/30/2011  . GRANULOMA 08/27/2009  . Dysphagia 06/17/2009  . PROSTATITIS, HX OF 06/17/2009   Ailene Ravel, OTR/L,CBIS  [phone]  07/07/2016, 10:43 AM  [facility] [address] [place], [place], [address] Phone: [phone]   Fax:  [phone]  Name: LEXIN CERDA MRN: [id number] Date of Birth: 12-12-1950

## 2016-07-09 ENCOUNTER — Ambulatory Visit (HOSPITAL_COMMUNITY): Payer: BLUE CROSS/BLUE SHIELD | Attending: Orthopedic Surgery

## 2016-07-09 DIAGNOSIS — R29898 Other symptoms and signs involving the musculoskeletal system: Secondary | ICD-10-CM | POA: Diagnosis not present

## 2016-07-09 DIAGNOSIS — M25612 Stiffness of left shoulder, not elsewhere classified: Secondary | ICD-10-CM | POA: Diagnosis not present

## 2016-07-09 DIAGNOSIS — M25512 Pain in left shoulder: Secondary | ICD-10-CM | POA: Diagnosis not present

## 2016-07-09 NOTE — Therapy (Signed)
[place], [place], [address] Phone: [phone]   Fax:  [phone]  Occupational Therapy Treatment  Patient Details  Name: Adam [name] MRN: [id number] Date of Birth: May 24, 1951 Referring Provider: Earlie Server, MD  Encounter Date: 07/09/2016      OT End of Session - 07/09/16 1031    Visit Number 17   Number of Visits 24   Date for OT Re-Evaluation 07/29/16   Authorization Type 1) Medicare 2) BSBC   Authorization Time Period before 19th visit   Authorization - Visit Number 17   Authorization - Number of Visits 19   OT Start Time 0930   OT Stop Time 1015   OT Time Calculation (min) 45 min   Activity Tolerance Patient tolerated treatment well   Behavior During Therapy [facility] for tasks assessed/performed      Past Medical History:  Diagnosis Date  . Allergy   . BMI 31.0-31.9,adult JUNE 2011 180 LBS   . Chronic back pain   . Depression   . Eosinophilic granuloma ([place]) JAN 2011 GASTRIC, followed by Dr. Eugenia Pancoast   EGD JAN 2011, JUN 2011, NOV 2011  . Gastric ulcer 08/31/2012  . GERD (gastroesophageal reflux disease)   . HTN (hypertension)   . Hyperlipemia   . IBS (irritable bowel syndrome)   . Prostate hypertrophy     Past Surgical History:  Procedure Laterality Date  . BACK SURGERY    . COLONOSCOPY  JAN 2011 COMPLETE   hypoTN and bradycardia w/ Demerol ,Phenergan and Versed, [place] TICS, SML IH  . COLONOSCOPY  2003   NUR-normal  . COLONOSCOPY  06/21/09   Fields-sigmoid diverticulosis, sm int hemorrhoids  . COLONOSCOPY N/A 10/24/2013   Normal mucosa in the terminal ileum Moderate diverticulosis noted in the sigmoid colonModerate sized internal hemorrhoids. negative random colon bx.. next TCS 10/2023  . Cyst Removed     from Left leg and knee  . ESOPHAGOGASTRODUODENOSCOPY  05/20/2012   SLF: MILD ESOPHAGITIS.NO BARRETT'S/Multiple ulcers ranging between 3-5 mm/MILD Duodenal inflammation was found in  the duodenal bulb  . ESOPHAGOGASTRODUODENOSCOPY N/A 09/05/2012   SLF: MILD Non-erosive gastritis (inflammation) in the gastric antrum. Biopsies benign, no H. pylori.  . ESOPHAGOGASTRODUODENOSCOPY N/A 10/24/2013   [id number] DISTAL ESOPHAGEAL WEBSmall hiatal herniaMILD Non-erosive gastritis. SB bx benign. gastric bx, minimal inflammation.   Marland Kitchen GANGLION CYST EXCISION     from L wrist and pins placed for Fx involving L thumb  . HAND SURGERY  at the age of 50   Brother accidentally cut it  . MALONEY DILATION N/A 10/24/2013   Procedure: MALONEY DILATION;  Surgeon: Danie Binder, MD;  [facility];  Service: Endoscopy;  Laterality: N/A;  . MASS EXCISION  04/29/2012   Procedure: EXCISION MASS;  Surgeon: Donato Heinz, MD;  Location: [facility];  Service: General;  Laterality: Right;  Excision of Vascular Mass Right Arm  . SAVORY DILATION N/A 10/24/2013   Procedure: SAVORY DILATION;  Surgeon: Danie Binder, MD;  [facility];  Service: Endoscopy;  Laterality: N/A;  . TUMOR REMOVAL  1994   [facility]  . UPPER GASTROINTESTINAL ENDOSCOPY  JAN 2011   GASTRIC ULCER-EOS GRANULOMA  . UPPER GASTROINTESTINAL ENDOSCOPY  JUN 2011   GASTRIC ULCER- EOS, NO H. PYLORI  . UPPER GASTROINTESTINAL ENDOSCOPY  NOV 2011   GASTRIC NODULE, no ulcer- EOS    There were no vitals filed for this visit.  [facility] OT Assessment - 07/09/16 0932      Assessment   Diagnosis Left shoulder RTC repair     Precautions   Precautions Shoulder   Type of Shoulder Precautions Non-agressive RTC Repair Protocol. Per Dr. Caffrey No abduction for 6 weeks. Week 0-5 (06/10/16): P/ROM within following limits: Flexion (no limits), er 0-30, IR (no limits). pendulums, wrist A/ROM, scapular A/ROM. In 2 weeks (05/20/16) Begin glides and scapular distraction. Week 5-6 (06/17/16): P/ROM to following limits: er (0-45); Abduction A/ROM prone shoulder extension to neutral, start isometric IR and  extension. Week 6-7 (06/24/16): ROM limits: er (0-80) and Abduction (80 degrees). All isometrics, scapular strengthening with theraband. Week 7-8 (07/01/16): No limits for ROM. AA/ROM exercises standing. Week 8-9 (07/08/16): UBE. Prone PREs. A/ROM exercies. Week 9+: strengthening exercises.      AROM   Overall AROM Comments Assessed supine and seated. IR/er adducted. Assessed for the first time this session.   AROM Assessment Site Shoulder   Right/Left Shoulder Left   Left Shoulder Flexion 160 Degrees  seated: 143   Left Shoulder ABduction 180 Degrees  seated:160   Left Shoulder Internal Rotation 90 Degrees  seated: 90   Left Shoulder External Rotation 70 Degrees  seated: 50     PROM   Overall PROM Comments Assessed supine. IR/er adducted.   PROM Assessment Site Shoulder   Right/Left Shoulder Left   Left Shoulder Flexion 160 Degrees  157   Left Shoulder ABduction 180 Degrees  previous: 140   Left Shoulder Internal Rotation 90 Degrees  previous: same   Left Shoulder External Rotation 71 Degrees  previous: 60     Strength   Overall Strength Comments Assessed seated. IR/er adducted. Strength assessed for the first time today.   Strength Assessment Site Shoulder   Right/Left Shoulder Left   Left Shoulder Flexion 4-/5   Left Shoulder ABduction 3/5   Left Shoulder Internal Rotation 4+/5   Left Shoulder External Rotation 3/5                  OT Treatments/Exercises (OP) - 07/09/16 0954      Exercises   Exercises Shoulder     Shoulder Exercises: Supine   Protraction PROM;5 reps   Horizontal ABduction PROM;5 reps   External Rotation PROM;5 reps   Internal Rotation PROM;5 reps   Flexion PROM;5 reps   ABduction PROM;5 reps     Shoulder Exercises: Prone   Flexion AROM;12 reps   Extension AROM;12 reps   Horizontal ABduction 1 AROM;12 reps   Horizontal ABduction 2 AROM;12 reps     Shoulder Exercises: Sidelying   External Rotation AROM;10 reps   Internal Rotation  AROM;10 reps   Flexion AROM;10 reps   ABduction AROM;10 reps   Other Sidelying Exercises Protraction; 10X; A/ROM   Other Sidelying Exercises Horizontal Abduction; 10X; A/ROM     Shoulder Exercises: ROM/Strengthening   UBE (Upper Arm Bike) level 2 2' reverse 2' forward   Over Head Lace 2' with one 30 second rest   Ball on Wall 1' flexion 1' abduction green ball   Prot/Ret//Elev/Dep `     Manual Therapy   Manual Therapy Myofascial release   Manual therapy comments Manual therapy completed prior to exercises                  OT Short Term Goals - 07/09/16 0950      OT SHORT TERM GOAL #1   Title Patient will be educated and independent with HEP   to increase functional use of LUE during daily tasks.    Time 6   Period Weeks   Status Achieved     OT SHORT TERM GOAL #2   Title Patient will increase P/ROM to WNL to increase ability to get shirts on and off easier.    Time 6   Period Weeks   Status Achieved     OT SHORT TERM GOAL #3   Title Patient will decrease pain to 5/10 when completing daily tasks with LUE.    Time 6   Period Weeks   Status Achieved     OT SHORT TERM GOAL #4   Title Patient will increase strength to 3+/5 to increase ability to complete daily tasks at waist level.   Time 6   Period Weeks   Status Partially Met     OT SHORT TERM GOAL #5   Title Patient will decrease fascial restrictions to Mod amount in LUE to increase functional mobility needed for reaching tasks.    Time 6   Period Weeks   Status Achieved           OT Long Term Goals - 07/09/16 0951      OT LONG TERM GOAL #1   Title Patient will return to highest level of independence with all daily tasks using LUE.   Time 12   Period Weeks   Status On-going     OT LONG TERM GOAL #2   Title Patient will increase LUE strength to 4/5 to increase ability to lift normal household items.   Time 12   Period Weeks   Status On-going     OT LONG TERM GOAL #3   Title Patient will  increase LUE A/ROM to WNL to increase ability to complete reaching tasks overhead.   Time 12   Period Weeks   Status On-going     OT LONG TERM GOAL #4   Title Patient will decrease fascial restrictions to min amount or less to increase functional mobility needed for overhead reaching tasks.    Time 12   Period Weeks   Status Achieved     OT LONG TERM GOAL #5   Title Patient will decrease pain level to 2/10 or less when completing daily tasks.   Time 12   Period Weeks   Status Achieved               Plan - 07/09/16 1032    Clinical Impression Statement A: Mini reassessment completed this date. patient has met 4/5 STGs and 2/5 LTGs. Patient's greatest deficits are his Shoulder flexion and abduction as well as his strength. Patient continues to have some swelling in his left shoulder with no pain other than some muscle pain in upper trapezius muscle when completing HEP   Plan P: Continue with 2x a week for 4 weeks. Focus on functional reaching and depressing scapula when completing. To decrease trapezius activation complete prone horizontal abduction with shoulder completing external rotation at end range if able. Update G code   Consulted and Agree with Plan of Care Patient      Patient will benefit from skilled therapeutic intervention in order to improve the following deficits and impairments:  Pain, Impaired UE functional use, Increased fascial restricitons, Decreased range of motion, Increased edema  Visit Diagnosis: Other symptoms and signs involving the musculoskeletal system  Stiffness of left shoulder, not elsewhere classified  Acute pain of left shoulder    Problem List Patient Active Problem List  Diagnosis Date Noted  . Depression 03/10/2016  . Right arm pain 10/31/2014  . Overuse injury 10/31/2014  . Cervical disc disorder with radiculopathy of cervical region 10/31/2014  . IBS (irritable bowel syndrome) 10/19/2013  . Back pain 01/02/2013  .  Hyperlipidemia LDL goal <130 09/20/2012  . Essential hypertension, benign 09/20/2012  . GERD (gastroesophageal reflux disease) 12/30/2011  . GRANULOMA 08/27/2009  . Dysphagia 06/17/2009  . PROSTATITIS, HX OF 06/17/2009    , OTR/L,CBIS  [phone]  07/09/2016, 10:57 AM  [facility] [address] , [place], [address] Phone: [phone]   Fax:  [phone]  Name: Adam [name] MRN: [id number] Date of Birth: 03/24/1951 

## 2016-07-14 ENCOUNTER — Ambulatory Visit (HOSPITAL_COMMUNITY): Payer: BLUE CROSS/BLUE SHIELD

## 2016-07-14 ENCOUNTER — Encounter (HOSPITAL_COMMUNITY): Payer: Self-pay

## 2016-07-14 DIAGNOSIS — R29898 Other symptoms and signs involving the musculoskeletal system: Secondary | ICD-10-CM

## 2016-07-14 DIAGNOSIS — M25512 Pain in left shoulder: Secondary | ICD-10-CM | POA: Diagnosis not present

## 2016-07-14 DIAGNOSIS — M25612 Stiffness of left shoulder, not elsewhere classified: Secondary | ICD-10-CM

## 2016-07-14 NOTE — Therapy (Signed)
[place], [place], [address] Phone: [phone]   Fax:  [phone]  Occupational Therapy Treatment  Patient Details  Name: Adam [name] MRN: [id number] Date of Birth: 11-Oct-1950 Referring Provider: Earlie Server, MD  Encounter Date: 07/14/2016      OT End of Session - 07/14/16 1307    Visit Number 18   Number of Visits 24   Date for OT Re-Evaluation 07/29/16   Authorization Type 1) Medicare 2) BSBC   Authorization Time Period before 28th visit   Authorization - Visit Number 18   Authorization - Number of Visits 28   OT Start Time [phone]   OT Stop Time 1030   OT Time Calculation (min) 39 min   Activity Tolerance Patient tolerated treatment well   Behavior During Therapy [facility] for tasks assessed/performed      Past Medical History:  Diagnosis Date  . Allergy   . BMI 31.0-31.9,adult JUNE 2011 180 LBS   . Chronic back pain   . Depression   . Eosinophilic granuloma ([place]) JAN 2011 GASTRIC, followed by Dr. Eugenia Pancoast   EGD JAN 2011, JUN 2011, NOV 2011  . Gastric ulcer 08/31/2012  . GERD (gastroesophageal reflux disease)   . HTN (hypertension)   . Hyperlipemia   . IBS (irritable bowel syndrome)   . Prostate hypertrophy     Past Surgical History:  Procedure Laterality Date  . BACK SURGERY    . COLONOSCOPY  JAN 2011 COMPLETE   hypoTN and bradycardia w/ Demerol ,Phenergan and Versed, [place] TICS, SML IH  . COLONOSCOPY  2003   NUR-normal  . COLONOSCOPY  06/21/09   Fields-sigmoid diverticulosis, sm int hemorrhoids  . COLONOSCOPY N/A 10/24/2013   Normal mucosa in the terminal ileum Moderate diverticulosis noted in the sigmoid colonModerate sized internal hemorrhoids. negative random colon bx.. next TCS 10/2023  . Cyst Removed     from Left leg and knee  . ESOPHAGOGASTRODUODENOSCOPY  05/20/2012   SLF: MILD ESOPHAGITIS.NO BARRETT'S/Multiple ulcers ranging between 3-5 mm/MILD Duodenal inflammation was found in  the duodenal bulb  . ESOPHAGOGASTRODUODENOSCOPY N/A 09/05/2012   SLF: MILD Non-erosive gastritis (inflammation) in the gastric antrum. Biopsies benign, no H. pylori.  . ESOPHAGOGASTRODUODENOSCOPY N/A 10/24/2013   IFO:YDXAJOIN DISTAL ESOPHAGEAL WEBSmall hiatal herniaMILD Non-erosive gastritis. SB bx benign. gastric bx, minimal inflammation.   Marland Kitchen GANGLION CYST EXCISION     from L wrist and pins placed for Fx involving L thumb  . HAND SURGERY  at the age of 15   Brother accidentally cut it  . MALONEY DILATION N/A 10/24/2013   Procedure: MALONEY DILATION;  Surgeon: Danie Binder, MD;  [facility];  Service: Endoscopy;  Laterality: N/A;  . MASS EXCISION  04/29/2012   Procedure: EXCISION MASS;  Surgeon: Donato Heinz, MD;  Location: [facility];  Service: General;  Laterality: Right;  Excision of Vascular Mass Right Arm  . SAVORY DILATION N/A 10/24/2013   Procedure: SAVORY DILATION;  Surgeon: Danie Binder, MD;  [facility]: [facility];  Service: Endoscopy;  Laterality: N/A;  . TUMOR REMOVAL  1994   Benign [facility] brain at [facility]  . UPPER GASTROINTESTINAL ENDOSCOPY  JAN 2011   GASTRIC ULCER-EOS GRANULOMA  . UPPER GASTROINTESTINAL ENDOSCOPY  JUN 2011   GASTRIC ULCER- EOS, NO H. PYLORI  . UPPER GASTROINTESTINAL ENDOSCOPY  NOV 2011   GASTRIC NODULE, no ulcer- EOS    There were no vitals filed for this visit.  Subjective Assessment - 07/14/16 1306    Subjective  S: I've been working on my reaching a lot at home.    Special Tests FOTO score: 64/100 (36% impaired)   Currently in Pain? No/denies            [facility] OT Assessment - 07/14/16 1022      Assessment   Diagnosis Left shoulder RTC repair     Precautions   Precautions Shoulder   Type of Shoulder Precautions Non-agressive RTC Repair Protocol. Per Dr. French Ana No abduction for 6 weeks. Week 0-5 (06/10/16): P/ROM within following limits: Flexion (no limits), er 0-30, IR (no limits). pendulums, wrist A/ROM, scapular  A/ROM. In 2 weeks (05/20/16) Begin glides and scapular distraction. Week 5-6 (06/17/16): P/ROM to following limits: er (0-45); Abduction A/ROM prone shoulder extension to neutral, start isometric IR and extension. Week 6-7 (06/24/16): ROM limits: er (0-80) and Abduction (80 degrees). All isometrics, scapular strengthening with theraband. Week 7-8 (07/01/16): No limits for ROM. AA/ROM exercises standing. Week 8-9 (07/08/16): UBE. Prone PREs. A/ROM exercies. Week 9+: strengthening exercises.                   OT Treatments/Exercises (OP) - 07/14/16 1022      Exercises   Exercises Shoulder     Shoulder Exercises: Supine   Protraction PROM;5 reps;AROM;20 reps   Horizontal ABduction PROM;5 reps;AROM;20 reps   External Rotation PROM;5 reps   Internal Rotation PROM;5 reps   Flexion PROM;5 reps   ABduction PROM;5 reps;AROM;20 reps     Shoulder Exercises: Prone   Extension Strengthening;12 reps   Extension Weight (lbs) 2   Horizontal ABduction 1 AROM;15 reps   Horizontal ABduction 2 AROM;12 reps  added external rotation at end of horizontal abduction movem   Other Prone Exercises Y/scaption; A/ROM 12X     Shoulder Exercises: ROM/Strengthening   UBE (Upper Arm Bike) level 2 2' reverse 2' forward   Over Head Lace 2'   Other ROM/Strengthening Exercises Finger walks with pink ball; completed 10X. Patient was able to extend arm overhead this session.    Other ROM/Strengthening Exercises Finger ladder completed with 6 ascents.     Manual Therapy   Manual Therapy Myofascial release   Manual therapy comments Manual therapy completed prior to exercises   Myofascial Release myofascial release and manual stretching completed to left upper arm, trapeziu, and scapularis region to decrease fascial restrctions and increase joint                   OT Short Term Goals - 07/14/16 1312      OT SHORT TERM GOAL #1   Title Patient will be educated and independent with HEP to increase  functional use of LUE during daily tasks.    Time 6   Period Weeks     OT SHORT TERM GOAL #2   Title Patient will increase P/ROM to WNL to increase ability to get shirts on and off easier.    Time 6   Period Weeks     OT SHORT TERM GOAL #3   Title Patient will decrease pain to 5/10 when completing daily tasks with LUE.    Time 6   Period Weeks     OT SHORT TERM GOAL #4   Title Patient will increase strength to 3+/5 to increase ability to complete daily tasks at waist level.   Time 6   Period Weeks   Status Partially Met     OT SHORT TERM  GOAL #5   Title Patient will decrease fascial restrictions to Mod amount in LUE to increase functional mobility needed for reaching tasks.    Time 6   Period Weeks           OT Long Term Goals - 07-31-16 1312      OT LONG TERM GOAL #1   Title Patient will return to highest level of independence with all daily tasks using LUE.   Time 12   Period Weeks   Status On-going     OT LONG TERM GOAL #2   Title Patient will increase LUE strength to 4/5 to increase ability to lift normal household items.   Time 12   Period Weeks   Status On-going     OT LONG TERM GOAL #3   Title Patient will increase LUE A/ROM to WNL to increase ability to complete reaching tasks overhead.   Time 12   Period Weeks   Status On-going     OT LONG TERM GOAL #4   Title Patient will decrease fascial restrictions to min amount or less to increase functional mobility needed for overhead reaching tasks.    Time 12   Period Weeks     OT LONG TERM GOAL #5   Title Patient will decrease pain level to 2/10 or less when completing daily tasks.   Time 12   Period Weeks               Plan - 07-31-16 1308    Clinical Impression Statement A: Focused on decreasing trapezius activiation this session with good carry over. Pt was able to respond to verbal cueing and correct form. G code updated. Patient is beginning to use his LUE for more daily tasks. Pt reports  that his A/ROM is increasing although continues to struggle with any kind of weight and strengthening task.   Plan P: Continue to work on less trapezius activation during exercises and functional reaching movements.       Patient will benefit from skilled therapeutic intervention in order to improve the following deficits and impairments:  Pain, Impaired UE functional use, Increased fascial restricitons, Decreased range of motion, Increased edema  Visit Diagnosis: Other symptoms and signs involving the musculoskeletal system  Stiffness of left shoulder, not elsewhere classified  Acute pain of left shoulder      G-Codes - 07-31-2016 1313    Functional Assessment Tool Used FOTO score: 64/100 (36% impaired)   Functional Limitation Carrying, moving and handling objects   Carrying, Moving and Handling Objects Current Status (W4097) At least 20 percent but less than 40 percent impaired, limited or restricted   Carrying, Moving and Handling Objects Goal Status (D5329) At least 20 percent but less than 40 percent impaired, limited or restricted      Problem List Patient Active Problem List   Diagnosis Date Noted  . Depression 03/10/2016  . Right arm pain 10/31/2014  . Overuse injury 10/31/2014  . Cervical disc disorder with radiculopathy of cervical region 10/31/2014  . IBS (irritable bowel syndrome) 10/19/2013  . Back pain 01/02/2013  . Hyperlipidemia LDL goal <130 09/20/2012  . Essential hypertension, benign 09/20/2012  . GERD (gastroesophageal reflux disease) 12/30/2011  . GRANULOMA 08/27/2009  . Dysphagia 06/17/2009  . PROSTATITIS, HX OF 06/17/2009   Ailene Ravel, OTR/L,CBIS  [phone]  Jul 31, 2016, 1:14 PM  [facility] [address] [place], [place], [address] Phone: [phone]   Fax:  [phone]  Name: Adam Shader  [name] MRN: [id number] Date of Birth: 1950/06/17

## 2016-07-15 IMAGING — MR MR CERVICAL SPINE W/O CM
4 of 6 series · 18 of 48 positions shown · non-contrast
Comparison: None.

CLINICAL DATA: Right arm pain and weakness for 3 weeks.

EXAM:
MRI CERVICAL SPINE WITHOUT CONTRAST
TECHNIQUE: Multiplanar, multisequence MR imaging of the cervical spine was
performed. No intravenous contrast was administered.

[Series 3: T2 · sagittal · 3.0mm · 0.42mm/px · 4 of 13 slices shown (1 of 3)]
[im 1/13]
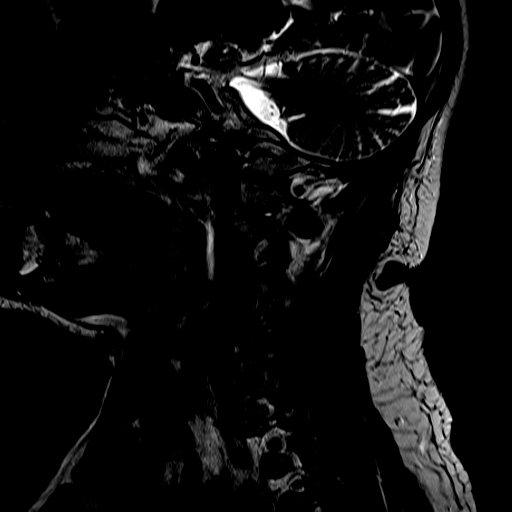
[im 5/13]
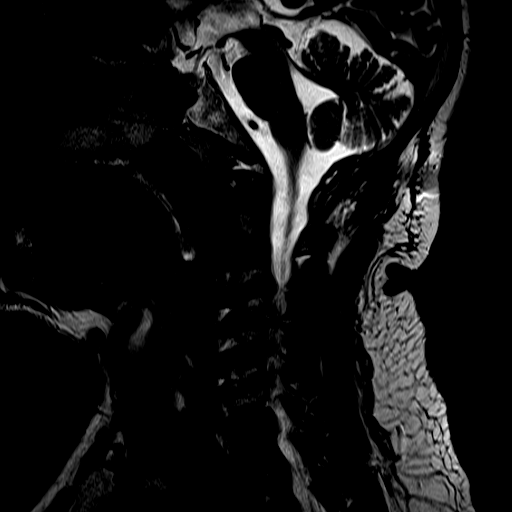
[im 9/13]
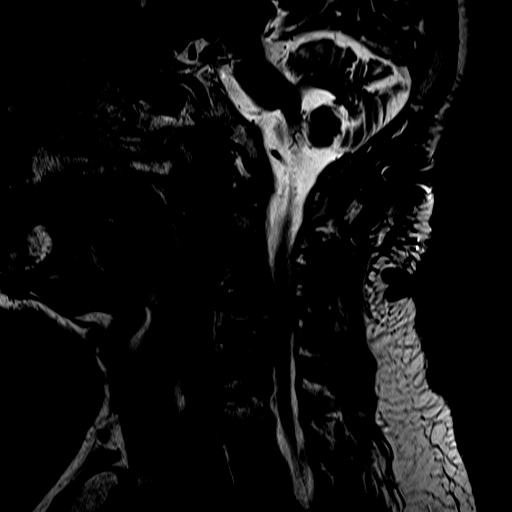
[im 13/13]
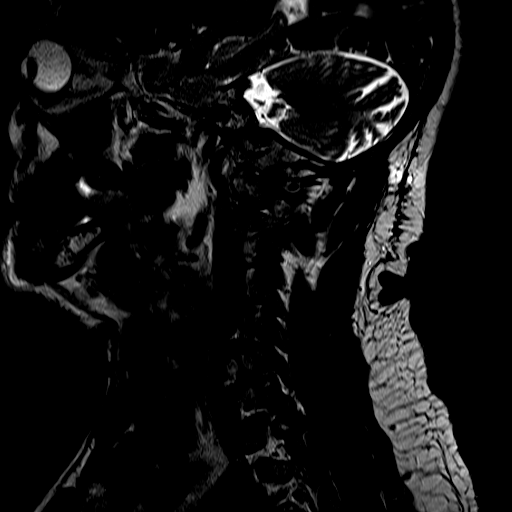

[Series 4: FLAIR · sagittal · 3.0mm · 0.44mm/px · 3 of 13 slices shown]
[im 1/13]
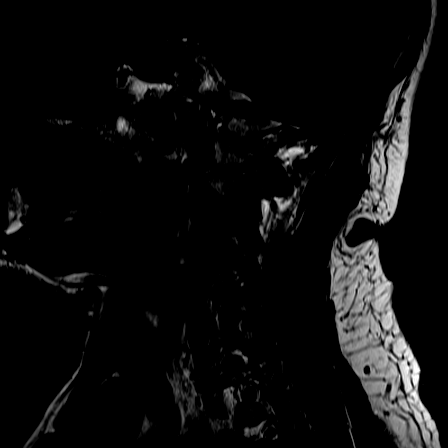
[im 7/13]
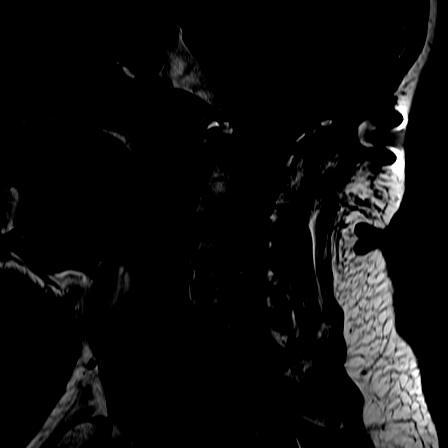
[im 13/13]
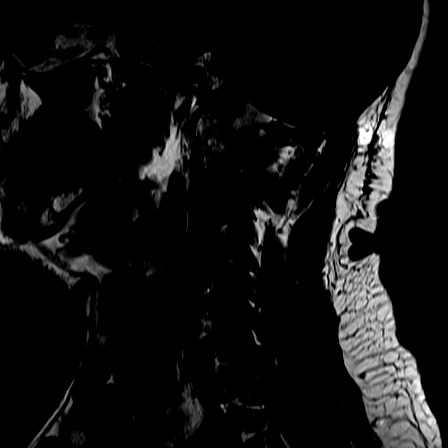

[Series 7: T2 · axial · 3.0mm · 0.22mm/px · z∈[-71,+42]mm · 8 of 36 slices shown (2 of 3)]
[im 1/36]
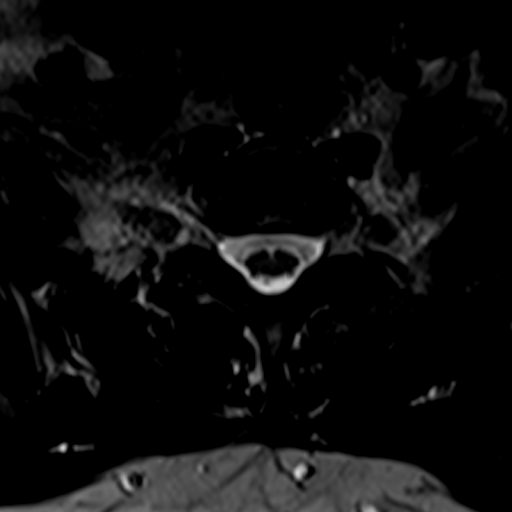
[im 6/36]
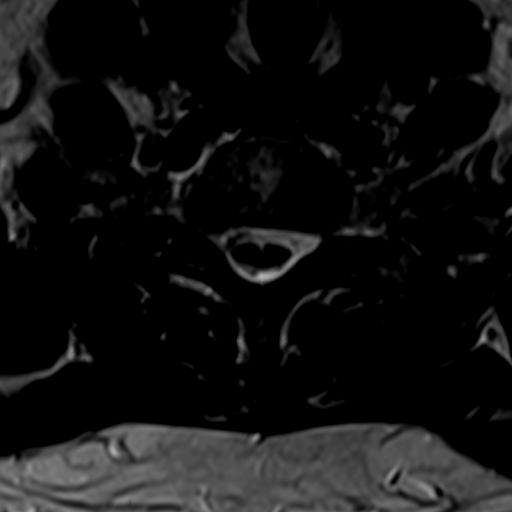
[im 11/36]
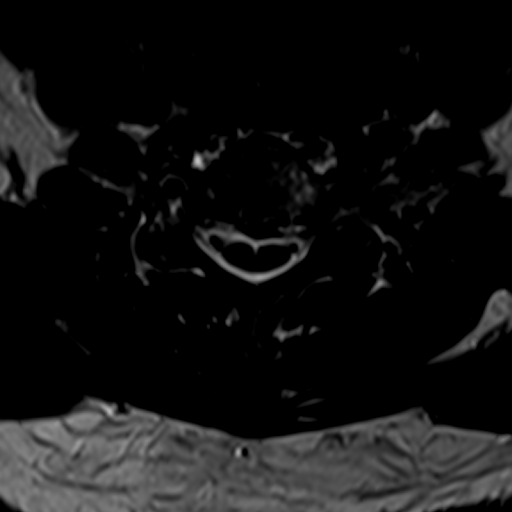
[im 17/36]
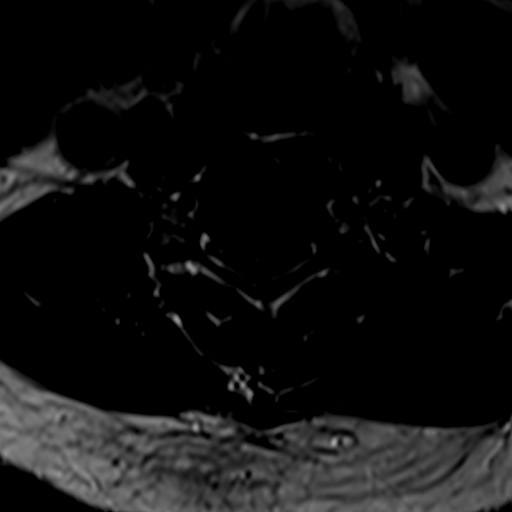
[im 19/36]
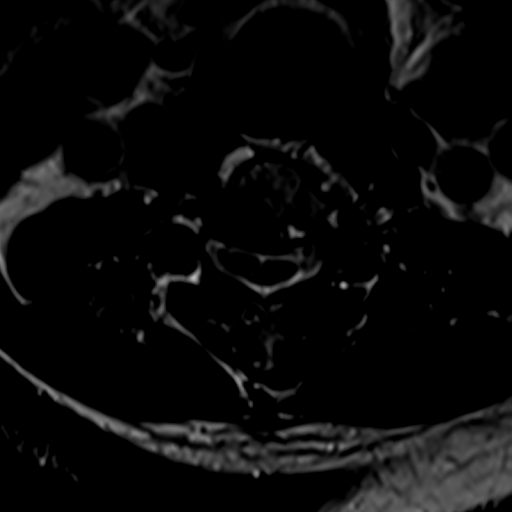
[im 25/36]
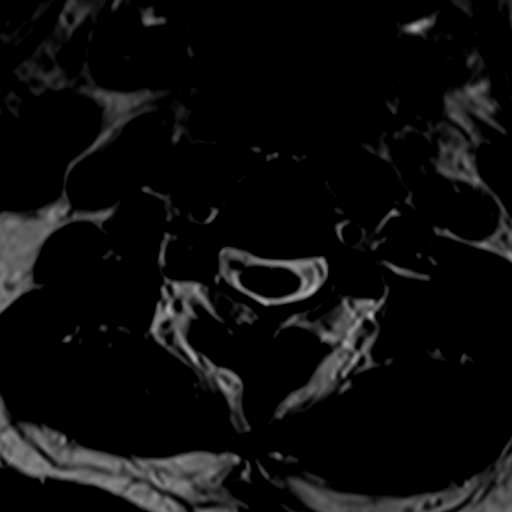
[im 30/36]
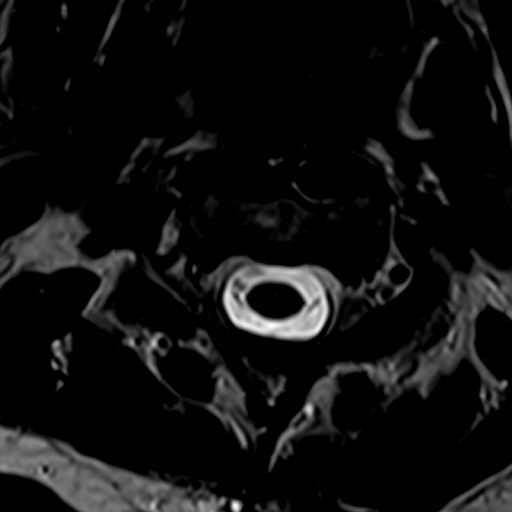
[im 36/36]
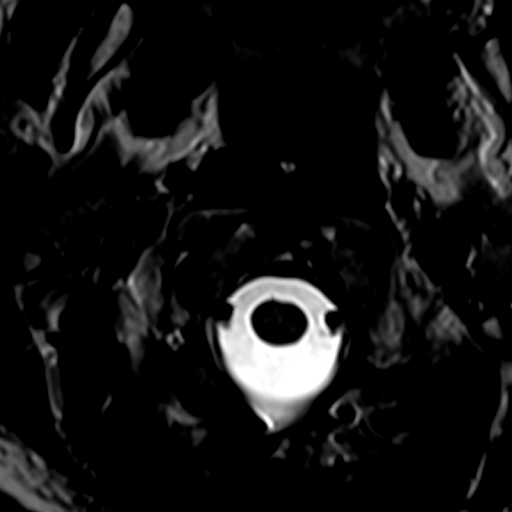

[Series 8: T2 · sagittal · 3.0mm · 0.32mm/px · 3 of 15 slices shown (3 of 3)]
[im 3/15]
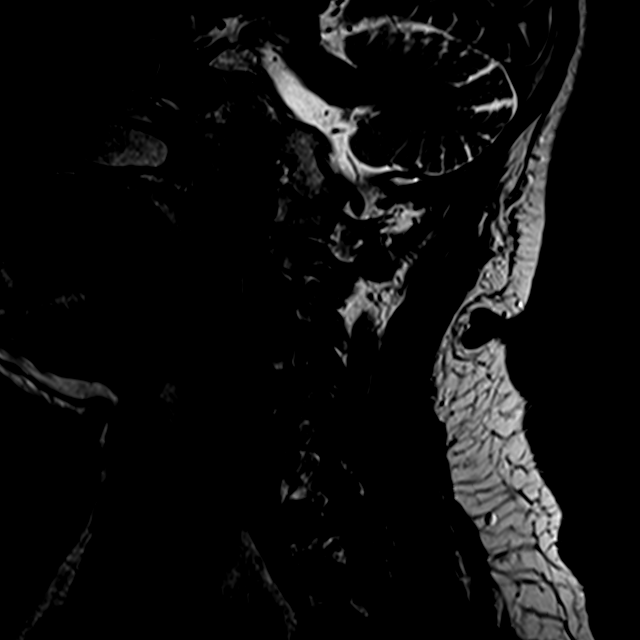
[im 9/15]
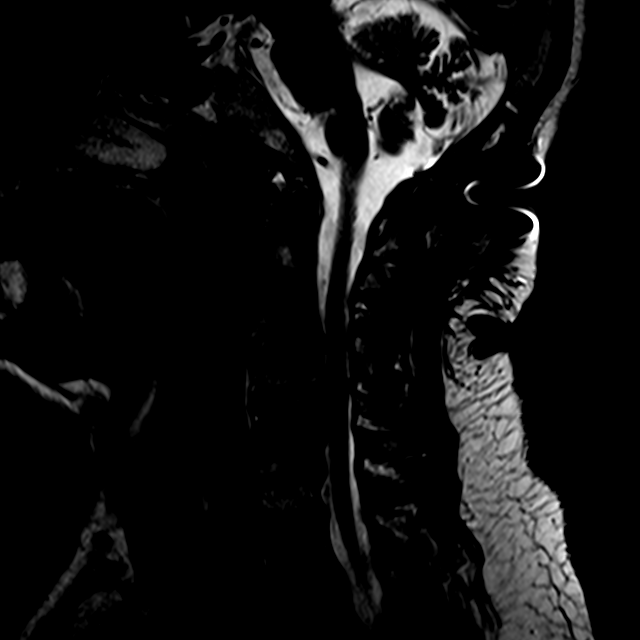
[im 15/15]
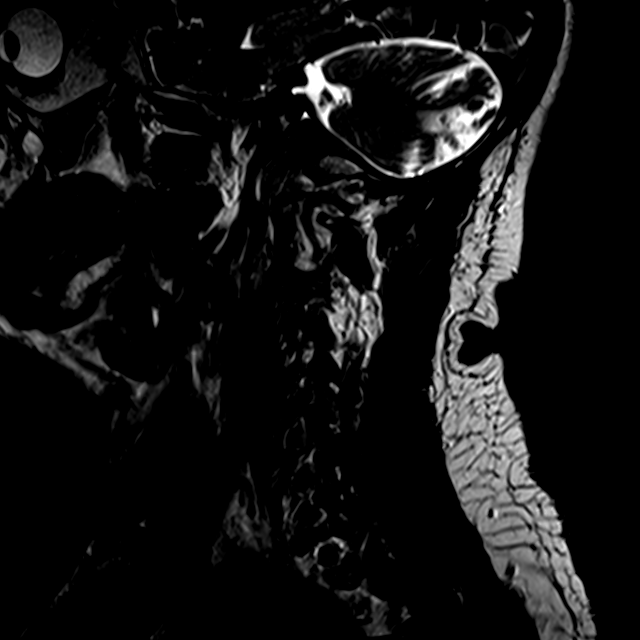

[18 of 48 positions shown; findings below may reference images not displayed]

FINDINGS: The visualized intracranial contents are normal. Previous occipital
craniotomy.

C1-2 and C2-3:  Normal.

C3-4: Disc space narrowing. Osteophytes narrow both neural foramina,
left greater than right. Small broad-based disc bulge with
accompanying osteophytes.

C4-5: Disc space narrowing. Broad-based disc protrusion with
accompanying osteophytes slightly asymmetric to the left with slight
compression of the cervical spinal cord to the left of midline.
Severe left foraminal stenosis.

C5-6: Disc space narrowing. Broad-based disc osteophyte complex most
prominent centrally with no compression of the spinal cord.
Bilateral foraminal stenosis, left greater than right.

C6-7: Disc space narrowing. Broad-based disc osteophyte complex
asymmetric to the left with left foraminal stenosis.

C7-T1: Disc space narrowing. Small broad-based disc bulge with no
neural impingement. Moderate right foraminal stenosis. Right facet
arthritis.

T1-2:  Normal.
IMPRESSION: 1. Diffuse degenerative disc disease from C3-4 through C7-T1. Right
foraminal stenosis at C7-T1.
2. Left foraminal stenosis at C3-4, C4-5, C5-6, and C6-7.
3. Slight spinal cord compression at C4-5 to the left of midline. No
myelopathy.

## 2016-07-16 ENCOUNTER — Ambulatory Visit (HOSPITAL_COMMUNITY): Payer: BLUE CROSS/BLUE SHIELD

## 2016-07-16 ENCOUNTER — Encounter (HOSPITAL_COMMUNITY): Payer: Self-pay

## 2016-07-16 DIAGNOSIS — R29898 Other symptoms and signs involving the musculoskeletal system: Secondary | ICD-10-CM | POA: Diagnosis not present

## 2016-07-16 DIAGNOSIS — M25512 Pain in left shoulder: Secondary | ICD-10-CM

## 2016-07-16 DIAGNOSIS — M25612 Stiffness of left shoulder, not elsewhere classified: Secondary | ICD-10-CM | POA: Diagnosis not present

## 2016-07-16 NOTE — Therapy (Signed)
[place], [place], [address] Phone: [phone]   Fax:  [phone]  Occupational Therapy Treatment  Patient Details  Name: Adam [name] MRN: [id number] Date of Birth: Nov 20, 1950 Referring Provider: Earlie Server, MD  Encounter Date: 07/16/2016      OT End of Session - 07/16/16 1009    Visit Number 19   Number of Visits 24   Date for OT Re-Evaluation 07/29/16   Authorization Type 1) Medicare 2) BSBC   Authorization Time Period before 28th visit   Authorization - Visit Number 19   Authorization - Number of Visits 28   OT Start Time 0920   OT Stop Time 1005   OT Time Calculation (min) 45 min   Activity Tolerance Patient tolerated treatment well   Behavior During Therapy [facility] for tasks assessed/performed      Past Medical History:  Diagnosis Date  . Allergy   . BMI 31.0-31.9,adult JUNE 2011 180 LBS   . Chronic back pain   . Depression   . Eosinophilic granuloma ([place]) JAN 2011 GASTRIC, followed by Dr. Eugenia Pancoast   EGD JAN 2011, JUN 2011, NOV 2011  . Gastric ulcer 08/31/2012  . GERD (gastroesophageal reflux disease)   . HTN (hypertension)   . Hyperlipemia   . IBS (irritable bowel syndrome)   . Prostate hypertrophy     Past Surgical History:  Procedure Laterality Date  . BACK SURGERY    . COLONOSCOPY  JAN 2011 COMPLETE   hypoTN and bradycardia w/ Demerol ,Phenergan and Versed, [place] TICS, SML IH  . COLONOSCOPY  2003   NUR-normal  . COLONOSCOPY  06/21/09   Fields-sigmoid diverticulosis, sm int hemorrhoids  . COLONOSCOPY N/A 10/24/2013   Normal mucosa in the terminal ileum Moderate diverticulosis noted in the sigmoid colonModerate sized internal hemorrhoids. negative random colon bx.. next TCS 10/2023  . Cyst Removed     from Left leg and knee  . ESOPHAGOGASTRODUODENOSCOPY  05/20/2012   SLF: MILD ESOPHAGITIS.NO BARRETT'S/Multiple ulcers ranging between 3-5 mm/MILD Duodenal inflammation was found in  the duodenal bulb  . ESOPHAGOGASTRODUODENOSCOPY N/A 09/05/2012   SLF: MILD Non-erosive gastritis (inflammation) in the gastric antrum. Biopsies benign, no H. pylori.  . ESOPHAGOGASTRODUODENOSCOPY N/A 10/24/2013   TGY:BWLSLHTD DISTAL ESOPHAGEAL WEBSmall hiatal herniaMILD Non-erosive gastritis. SB bx benign. gastric bx, minimal inflammation.   Marland Kitchen GANGLION CYST EXCISION     from L wrist and pins placed for Fx involving L thumb  . HAND SURGERY  at the age of 44   Brother accidentally cut it  . MALONEY DILATION N/A 10/24/2013   Procedure: MALONEY DILATION;  Surgeon: Danie Binder, MD;  [facility];  Service: Endoscopy;  Laterality: N/A;  . MASS EXCISION  04/29/2012   Procedure: EXCISION MASS;  Surgeon: Donato Heinz, MD;  Location: [facility];  Service: General;  Laterality: Right;  Excision of Vascular Mass Right Arm  . SAVORY DILATION N/A 10/24/2013   Procedure: SAVORY DILATION;  Surgeon: Danie Binder, MD;  [facility];  Service: Endoscopy;  Laterality: N/A;  . TUMOR REMOVAL  1994   Benign [facility] brain at [facility]  . UPPER GASTROINTESTINAL ENDOSCOPY  JAN 2011   GASTRIC ULCER-EOS GRANULOMA  . UPPER GASTROINTESTINAL ENDOSCOPY  JUN 2011   GASTRIC ULCER- EOS, NO H. PYLORI  . UPPER GASTROINTESTINAL ENDOSCOPY  NOV 2011   GASTRIC NODULE, no ulcer- EOS    There were no vitals filed for this visit.  Subjective Assessment - 07/16/16 0940    Subjective  S: That swelling is still there.   Currently in Pain? No/denies            [facility] OT Assessment - 07/16/16 0941      Assessment   Diagnosis Left shoulder RTC repair     Precautions   Precautions Shoulder   Type of Shoulder Precautions Non-agressive RTC Repair Protocol. Per Dr. French Ana No abduction for 6 weeks. Week 0-5 (06/10/16): P/ROM within following limits: Flexion (no limits), er 0-30, IR (no limits). pendulums, wrist A/ROM, scapular A/ROM. In 2 weeks (05/20/16) Begin glides and scapular distraction. Week  5-6 (06/17/16): P/ROM to following limits: er (0-45); Abduction A/ROM prone shoulder extension to neutral, start isometric IR and extension. Week 6-7 (06/24/16): ROM limits: er (0-80) and Abduction (80 degrees). All isometrics, scapular strengthening with theraband. Week 7-8 (07/01/16): No limits for ROM. AA/ROM exercises standing. Week 8-9 (07/08/16): UBE. Prone PREs. A/ROM exercies. Week 9+: strengthening exercises.                   OT Treatments/Exercises (OP) - 07/16/16 0941      Exercises   Exercises Shoulder     Shoulder Exercises: Supine   Protraction PROM;5 reps   Horizontal ABduction PROM;5 reps   External Rotation PROM;5 reps   Internal Rotation PROM;5 reps   Flexion PROM;5 reps   ABduction PROM;5 reps     Shoulder Exercises: Prone   Retraction Strengthening;12 reps   Retraction Weight (lbs) 2   Extension Strengthening;12 reps   Extension Weight (lbs) 2   Horizontal ABduction 1 AROM;15 reps   Horizontal ABduction 2 AROM;12 reps  added external rotation at end of horizontal abduction   Other Prone Exercises Y/scaption; A/ROM 12X     Shoulder Exercises: Standing   Extension Theraband;12 reps   Theraband Level (Shoulder Extension) Level 3 (Green)   Row [employer] reps   Theraband Level (Shoulder Row) Level 3 (Green)   Retraction Theraband;12 reps   Theraband Level (Shoulder Retraction) Level 3 (Green)     Shoulder Exercises: ROM/Strengthening   UBE (Upper Arm Bike) level 2 2' reverse 2' forward   Over Head Lace 2'   Ball on Wall 1' flexion 1' abduction green ball  physical cueing for positioning.     Manual Therapy   Manual Therapy Myofascial release   Manual therapy comments Manual therapy completed prior to exercises   Myofascial Release myofascial release and manual stretching completed to left upper arm, trapeziu, and scapularis region to decrease fascial restrctions and increase joint                   OT Short Term Goals - 07/14/16 1312       OT SHORT TERM GOAL #1   Title Patient will be educated and independent with HEP to increase functional use of LUE during daily tasks.    Time 6   Period Weeks     OT SHORT TERM GOAL #2   Title Patient will increase P/ROM to WNL to increase ability to get shirts on and off easier.    Time 6   Period Weeks     OT SHORT TERM GOAL #3   Title Patient will decrease pain to 5/10 when completing daily tasks with LUE.    Time 6   Period Weeks     OT SHORT TERM GOAL #4   Title Patient will increase strength to 3+/5 to increase ability to complete daily  tasks at waist level.   Time 6   Period Weeks   Status Partially Met     OT SHORT TERM GOAL #5   Title Patient will decrease fascial restrictions to Mod amount in LUE to increase functional mobility needed for reaching tasks.    Time 6   Period Weeks           OT Long Term Goals - 07/14/16 1312      OT LONG TERM GOAL #1   Title Patient will return to highest level of independence with all daily tasks using LUE.   Time 12   Period Weeks   Status On-going     OT LONG TERM GOAL #2   Title Patient will increase LUE strength to 4/5 to increase ability to lift normal household items.   Time 12   Period Weeks   Status On-going     OT LONG TERM GOAL #3   Title Patient will increase LUE A/ROM to WNL to increase ability to complete reaching tasks overhead.   Time 12   Period Weeks   Status On-going     OT LONG TERM GOAL #4   Title Patient will decrease fascial restrictions to min amount or less to increase functional mobility needed for overhead reaching tasks.    Time 12   Period Weeks     OT LONG TERM GOAL #5   Title Patient will decrease pain level to 2/10 or less when completing daily tasks.   Time 12   Period Weeks               Plan - 07/16/16 1010    Clinical Impression Statement A: Continues to have swelling in left shoulder although no pain is present. Patient continues to require VC for form and  technique more so to depress shoulder during exercises requiring physical cues as well when needed.   Plan P; Measure for MD appointment and provide patient with measurements to take with him. Continue to work on less trapezius activation during exercises and functional reaching. Instead of using a ball for Ball on the wall try just using a washcloth for better form.      Patient will benefit from skilled therapeutic intervention in order to improve the following deficits and impairments:  Pain, Impaired UE functional use, Increased fascial restricitons, Decreased range of motion, Increased edema  Visit Diagnosis: Other symptoms and signs involving the musculoskeletal system  Stiffness of left shoulder, not elsewhere classified  Acute pain of left shoulder    Problem List Patient Active Problem List   Diagnosis Date Noted  . Depression 03/10/2016  . Right arm pain 10/31/2014  . Overuse injury 10/31/2014  . Cervical disc disorder with radiculopathy of cervical region 10/31/2014  . IBS (irritable bowel syndrome) 10/19/2013  . Back pain 01/02/2013  . Hyperlipidemia LDL goal <130 09/20/2012  . Essential hypertension, benign 09/20/2012  . GERD (gastroesophageal reflux disease) 12/30/2011  . GRANULOMA 08/27/2009  . Dysphagia 06/17/2009  . PROSTATITIS, HX OF 06/17/2009   Ailene Ravel, OTR/L,CBIS  [phone]  07/16/2016, 10:13 AM  [facility] [address] [place], [place], [address] Phone: [phone]   Fax:  [phone]  Name: IGNACE MANDIGO MRN: [id number] Date of Birth: Nov 01, 1950

## 2016-07-20 ENCOUNTER — Ambulatory Visit (HOSPITAL_COMMUNITY): Payer: BLUE CROSS/BLUE SHIELD | Admitting: Specialist

## 2016-07-20 ENCOUNTER — Encounter (HOSPITAL_COMMUNITY): Payer: Self-pay | Admitting: Specialist

## 2016-07-20 DIAGNOSIS — R29898 Other symptoms and signs involving the musculoskeletal system: Secondary | ICD-10-CM

## 2016-07-20 DIAGNOSIS — M25512 Pain in left shoulder: Secondary | ICD-10-CM

## 2016-07-20 DIAGNOSIS — M25612 Stiffness of left shoulder, not elsewhere classified: Secondary | ICD-10-CM | POA: Diagnosis not present

## 2016-07-20 NOTE — Therapy (Signed)
[place], [place], [address] Phone: [phone]   Fax:  [phone]  Occupational Therapy Treatment  Patient Details  Name: Adam [name] MRN: [id number] Date of Birth: [birth year] Referring Provider: Dr. Earlie Server  Encounter Date: 07/20/2016      OT End of Session - 07/20/16 1014    Visit Number 20   Number of Visits 24   Date for OT Re-Evaluation 07/29/16   Authorization Type 1) Medicare 2) BSBC   Authorization Time Period before 28th visit   Authorization - Visit Number 56   Authorization - Number of Visits 28   OT Start Time [phone]   OT Stop Time 1016   OT Time Calculation (min) 53 min      Past Medical History:  Diagnosis Date  . Allergy   . BMI 31.0-31.9,adult JUNE 2011 180 LBS   . Chronic back pain   . Depression   . Eosinophilic granuloma ([place]) JAN 2011 GASTRIC, followed by Dr. Eugenia Pancoast   EGD JAN 2011, JUN 2011, NOV 2011  . Gastric ulcer 08/31/2012  . GERD (gastroesophageal reflux disease)   . HTN (hypertension)   . Hyperlipemia   . IBS (irritable bowel syndrome)   . Prostate hypertrophy     Past Surgical History:  Procedure Laterality Date  . BACK SURGERY    . COLONOSCOPY  JAN 2011 COMPLETE   hypoTN and bradycardia w/ Demerol ,Phenergan and Versed, [place] TICS, SML IH  . COLONOSCOPY  2003   NUR-normal  . COLONOSCOPY  06/21/09   Fields-sigmoid diverticulosis, sm int hemorrhoids  . COLONOSCOPY N/A 10/24/2013   Normal mucosa in the terminal ileum Moderate diverticulosis noted in the sigmoid colonModerate sized internal hemorrhoids. negative random colon bx.. next TCS 10/2023  . Cyst Removed     from Left leg and knee  . ESOPHAGOGASTRODUODENOSCOPY  05/20/2012   SLF: MILD ESOPHAGITIS.NO BARRETT'S/Multiple ulcers ranging between 3-5 mm/MILD Duodenal inflammation was found in the duodenal bulb  . ESOPHAGOGASTRODUODENOSCOPY N/A 09/05/2012   SLF: MILD Non-erosive gastritis (inflammation) in  the gastric antrum. Biopsies benign, no H. pylori.  . ESOPHAGOGASTRODUODENOSCOPY N/A 10/24/2013   JEH:UDJSHFWY DISTAL ESOPHAGEAL WEBSmall hiatal herniaMILD Non-erosive gastritis. SB bx benign. gastric bx, minimal inflammation.   Marland Kitchen GANGLION CYST EXCISION     from L wrist and pins placed for Fx involving L thumb  . HAND SURGERY  at the age of 7   Brother accidentally cut it  . MALONEY DILATION N/A 10/24/2013   Procedure: MALONEY DILATION;  Surgeon: Danie Binder, MD;  [facility];  Service: Endoscopy;  Laterality: N/A;  . MASS EXCISION  04/29/2012   Procedure: EXCISION MASS;  Surgeon: Donato Heinz, MD;  Location: [facility];  Service: General;  Laterality: Right;  Excision of Vascular Mass Right Arm  . SAVORY DILATION N/A 10/24/2013   Procedure: SAVORY DILATION;  Surgeon: Danie Binder, MD;  [facility];  Service: Endoscopy;  Laterality: N/A;  . TUMOR REMOVAL  1994   Benign [facility] brain at [facility]  . UPPER GASTROINTESTINAL ENDOSCOPY  JAN 2011   GASTRIC ULCER-EOS GRANULOMA  . UPPER GASTROINTESTINAL ENDOSCOPY  JUN 2011   GASTRIC ULCER- EOS, NO H. PYLORI  . UPPER GASTROINTESTINAL ENDOSCOPY  NOV 2011   GASTRIC NODULE, no ulcer- EOS    There were no vitals filed for this visit.      Subjective Assessment - 07/20/16 1013    Subjective  S:  I do good at waist height.  above shoulder height its a bit difficult and my arm gets tired very easily.   Currently in Pain? No/denies            [facility] OT Assessment - 07/20/16 0001      Assessment   Diagnosis Left shoulder RTC repair   Referring Provider Dr. Frederico Hamman   Onset Date 05/06/16     Precautions   Precautions Shoulder   Type of Shoulder Precautions Non-agressive RTC Repair Protocol. Per Dr. Madelon Lips No abduction for 6 weeks. Week 0-5 (06/10/16): P/ROM within following limits: Flexion (no limits), er 0-30, IR (no limits). pendulums, wrist A/ROM, scapular A/ROM. In 2 weeks (05/20/16) Begin glides and  scapular distraction. Week 5-6 (06/17/16): P/ROM to following limits: er (0-45); Abduction A/ROM prone shoulder extension to neutral, start isometric IR and extension. Week 6-7 (06/24/16): ROM limits: er (0-80) and Abduction (80 degrees). All isometrics, scapular strengthening with theraband. Week 7-8 (07/01/16): No limits for ROM. AA/ROM exercises standing. Week 8-9 (07/08/16): UBE. Prone PREs. A/ROM exercies. Week 9+: strengthening exercises.      Observation/Other Assessments   Observations minimal swelling in anterior shoulder.  min mod fascial restrictions noted in scapular region    Focus on Therapeutic Outcomes (FOTO)  63%     AROM   Overall AROM Comments assessed in seated (07/09/16)   Right/Left Shoulder Left   Left Shoulder Flexion 163 Degrees  143   Left Shoulder ABduction 158 Degrees  160   Left Shoulder Internal Rotation 90 Degrees  90   Left Shoulder External Rotation 58 Degrees  50     Strength   Overall Strength Comments Assessed seated. IR/er adducted. (07/09/16)   Strength Assessment Site Shoulder   Right/Left Shoulder Left   Left Shoulder Flexion 3+/5  4-/5   Left Shoulder ABduction 3+/5  3/5   Left Shoulder Internal Rotation 4/5  4+/5   Left Shoulder External Rotation 4-/5  3/5                  OT Treatments/Exercises (OP) - 07/20/16 0001      Exercises   Exercises Shoulder     Shoulder Exercises: Supine   Protraction PROM;5 reps   Horizontal ABduction PROM;5 reps   External Rotation PROM;5 reps   Internal Rotation PROM;5 reps   Flexion PROM;5 reps   ABduction PROM;5 reps     Shoulder Exercises: Standing   Protraction Strengthening;10 reps   Protraction Weight (lbs) 1   Protraction Limitations min tactile cues   Horizontal ABduction Strengthening;10 reps   Horizontal ABduction Weight (lbs) 1   Horizontal ABduction Limitations mod tactile ques    External Rotation Strengthening;10 reps;Theraband;12 reps   Theraband Level (Shoulder External  Rotation) Level 3 (Green)   External Rotation Weight (lbs) 1   Internal Rotation Strengthening;10 reps;Theraband;12 reps   Theraband Level (Shoulder Internal Rotation) Level 3 (Green)   Internal Rotation Weight (lbs) 1   Flexion Strengthening;10 reps   Shoulder Flexion Weight (lbs) 1   Flexion Limitations min tactile cues   ABduction Strengthening;10 reps   Shoulder ABduction Weight (lbs) 1   ABduction Limitations min tactile cues    Extension Theraband;12 reps   Theraband Level (Shoulder Extension) Level 3 (Green)   Row [employer] reps   Theraband Level (Shoulder Row) Level 3 (Green)   Retraction Theraband;12 reps   Theraband Level (Shoulder Retraction) Level 3 (Green)     Shoulder Exercises: ROM/Strengthening   UBE (Upper Arm Bike)  level 2 reverse at 2.0    Wall Wash 1'   Wall Pushups 10 reps;Limitations   Wall Pushups Limitations with therapist providing cueing at shoulder and at elbow for support and form    Proximal Shoulder Strengthening, Seated 10 times with 1# resist no rest breaks    Ball on Wall wrote alphabet with washcloth at 90 degrees flexion a-o then rest, p-z wtih therapist providing tactile cues to depress shoulder    Other ROM/Strengthening Exercises finger ladder to 13 release hold bring down 5 times - effortless at beginning, max difficulty by 5th trial      Manual Therapy   Manual Therapy Myofascial release   Manual therapy comments Manual therapy completed prior to exercises   Myofascial Release myofascial release and manual stretching completed to left upper arm, trapeziu, and scapularis region to decrease fascial restrctions and increase joint                   OT Short Term Goals - 07/14/16 1312      OT SHORT TERM GOAL #1   Title Patient will be educated and independent with HEP to increase functional use of LUE during daily tasks.    Time 6   Period Weeks     OT SHORT TERM GOAL #2   Title Patient will increase P/ROM to WNL to increase  ability to get shirts on and off easier.    Time 6   Period Weeks     OT SHORT TERM GOAL #3   Title Patient will decrease pain to 5/10 when completing daily tasks with LUE.    Time 6   Period Weeks     OT SHORT TERM GOAL #4   Title Patient will increase strength to 3+/5 to increase ability to complete daily tasks at waist level.   Time 6   Period Weeks   Status Partially Met     OT SHORT TERM GOAL #5   Title Patient will decrease fascial restrictions to Mod amount in LUE to increase functional mobility needed for reaching tasks.    Time 6   Period Weeks           OT Long Term Goals - 07/14/16 1312      OT LONG TERM GOAL #1   Title Patient will return to highest level of independence with all daily tasks using LUE.   Time 12   Period Weeks   Status On-going     OT LONG TERM GOAL #2   Title Patient will increase LUE strength to 4/5 to increase ability to lift normal household items.   Time 12   Period Weeks   Status On-going     OT LONG TERM GOAL #3   Title Patient will increase LUE A/ROM to WNL to increase ability to complete reaching tasks overhead.   Time 12   Period Weeks   Status On-going     OT LONG TERM GOAL #4   Title Patient will decrease fascial restrictions to min amount or less to increase functional mobility needed for overhead reaching tasks.    Time 12   Period Weeks     OT LONG TERM GOAL #5   Title Patient will decrease pain level to 2/10 or less when completing daily tasks.   Time 12   Period Weeks               Plan - 07/20/16 1014    Clinical Impression Statement A:  Patient has  made significant gains in A/ROM in seated. Proximal shoulder strength is weak and patient compensates with scapular elevation.  Patient will benefit from continued skilled OT intervention to improve shoulder strength needed to complete functional activities with his left arm at shoulder height and above.     OT Frequency 2x / week   OT Duration 4 weeks   OT  Treatment/Interventions Self-care/ADL training;Therapeutic exercise;Dry needling;Patient/family education;Manual Therapy;Ultrasound;Cryotherapy;Electrical Stimulation;Moist Heat;Passive range of motion;Therapeutic activities;DME and/or AE instruction   Plan P:  continue seated strengthening with less cuing as patient becomes stronger and decreases compensatory movements.       Patient will benefit from skilled therapeutic intervention in order to improve the following deficits and impairments:  Pain, Impaired UE functional use, Increased fascial restricitons, Decreased range of motion, Increased edema  Visit Diagnosis: Other symptoms and signs involving the musculoskeletal system  Stiffness of left shoulder, not elsewhere classified  Acute pain of left shoulder    Problem List Patient Active Problem List   Diagnosis Date Noted  . Depression 03/10/2016  . Right arm pain 10/31/2014  . Overuse injury 10/31/2014  . Cervical disc disorder with radiculopathy of cervical region 10/31/2014  . IBS (irritable bowel syndrome) 10/19/2013  . Back pain 01/02/2013  . Hyperlipidemia LDL goal <130 09/20/2012  . Essential hypertension, benign 09/20/2012  . GERD (gastroesophageal reflux disease) 12/30/2011  . GRANULOMA 08/27/2009  . Dysphagia 06/17/2009  . Hope Budds OF 06/17/2009    Vangie Bicker, [place], OTR/L [phone]  07/20/2016, 10:17 AM  [place] [address] [place], [place], [address] Phone: [phone]   Fax:  [phone]  Name: ROBBIE RIDEAUX MRN: [id number] Date of Birth: 1951-03-25

## 2016-07-23 ENCOUNTER — Ambulatory Visit (HOSPITAL_COMMUNITY): Payer: BLUE CROSS/BLUE SHIELD

## 2016-07-24 ENCOUNTER — Encounter (HOSPITAL_COMMUNITY): Payer: Self-pay

## 2016-07-24 ENCOUNTER — Ambulatory Visit (HOSPITAL_COMMUNITY): Payer: BLUE CROSS/BLUE SHIELD

## 2016-07-24 DIAGNOSIS — M25512 Pain in left shoulder: Secondary | ICD-10-CM

## 2016-07-24 DIAGNOSIS — R29898 Other symptoms and signs involving the musculoskeletal system: Secondary | ICD-10-CM

## 2016-07-24 DIAGNOSIS — M25612 Stiffness of left shoulder, not elsewhere classified: Secondary | ICD-10-CM | POA: Diagnosis not present

## 2016-07-24 NOTE — Therapy (Signed)
[place], [place], [address] Phone: [phone]   Fax:  [phone]  Occupational Therapy Treatment  Patient Details  Name: Adam [name] MRN: [id number] Date of Birth: November 28, 1950 Referring Provider: Dr. Earlie Server  Encounter Date: 07/24/2016      OT End of Session - 07/24/16 1042    Visit Number 21   Number of Visits 24   Date for OT Re-Evaluation 07/29/16   Authorization Type 1) Medicare 2) BSBC   Authorization Time Period before 28th visit   Authorization - Visit Number 21   Authorization - Number of Visits 28   OT Start Time [phone]   OT Stop Time 1005   OT Time Calculation (min) 39 min   Activity Tolerance Patient tolerated treatment well   Behavior During Therapy [facility] for tasks assessed/performed      Past Medical History:  Diagnosis Date  . Allergy   . BMI 31.0-31.9,adult JUNE 2011 180 LBS   . Chronic back pain   . Depression   . Eosinophilic granuloma ([place]) JAN 2011 GASTRIC, followed by Dr. Eugenia Pancoast   EGD JAN 2011, JUN 2011, NOV 2011  . Gastric ulcer 08/31/2012  . GERD (gastroesophageal reflux disease)   . HTN (hypertension)   . Hyperlipemia   . IBS (irritable bowel syndrome)   . Prostate hypertrophy     Past Surgical History:  Procedure Laterality Date  . BACK SURGERY    . COLONOSCOPY  JAN 2011 COMPLETE   hypoTN and bradycardia w/ Demerol ,Phenergan and Versed, [place] TICS, SML IH  . COLONOSCOPY  2003   NUR-normal  . COLONOSCOPY  06/21/09   Fields-sigmoid diverticulosis, sm int hemorrhoids  . COLONOSCOPY N/A 10/24/2013   Normal mucosa in the terminal ileum Moderate diverticulosis noted in the sigmoid colonModerate sized internal hemorrhoids. negative random colon bx.. next TCS 10/2023  . Cyst Removed     from Left leg and knee  . ESOPHAGOGASTRODUODENOSCOPY  05/20/2012   SLF: MILD ESOPHAGITIS.NO BARRETT'S/Multiple ulcers ranging between 3-5 mm/MILD Duodenal inflammation was found in  the duodenal bulb  . ESOPHAGOGASTRODUODENOSCOPY N/A 09/05/2012   SLF: MILD Non-erosive gastritis (inflammation) in the gastric antrum. Biopsies benign, no H. pylori.  . ESOPHAGOGASTRODUODENOSCOPY N/A 10/24/2013   [facility]:ENIDPOEU DISTAL ESOPHAGEAL WEBSmall hiatal herniaMILD Non-erosive gastritis. SB bx benign. gastric bx, minimal inflammation.   Marland Kitchen GANGLION CYST EXCISION     from L wrist and pins placed for Fx involving L thumb  . HAND SURGERY  at the age of 32   Brother accidentally cut it  . MALONEY DILATION N/A 10/24/2013   Procedure: MALONEY DILATION;  Surgeon: Danie Binder, MD;  [facility];  Service: Endoscopy;  Laterality: N/A;  . MASS EXCISION  04/29/2012   Procedure: EXCISION MASS;  Surgeon: Donato Heinz, MD;  Location: [facility];  Service: General;  Laterality: Right;  Excision of Vascular Mass Right Arm  . SAVORY DILATION N/A 10/24/2013   Procedure: SAVORY DILATION;  Surgeon: Danie Binder, MD;  [facility];  Service: Endoscopy;  Laterality: N/A;  . TUMOR REMOVAL  1994   Benign from brain at [facility]  . UPPER GASTROINTESTINAL ENDOSCOPY  JAN 2011   GASTRIC ULCER-EOS GRANULOMA  . UPPER GASTROINTESTINAL ENDOSCOPY  JUN 2011   GASTRIC ULCER- EOS, NO H. PYLORI  . UPPER GASTROINTESTINAL ENDOSCOPY  NOV 2011   GASTRIC NODULE, no ulcer- EOS    There were no vitals filed for this visit.  Subjective Assessment - 07/24/16 0938    Subjective  S: The PA saw me and he said everything looked good. I don't have to see them again until Match 15th.   Currently in Pain? No/denies            [facility] OT Assessment - 07/24/16 0939      Assessment   Diagnosis Left shoulder RTC repair     Precautions   Precautions Shoulder   Type of Shoulder Precautions Progress as tolerated                  OT Treatments/Exercises (OP) - 07/24/16 0939      Exercises   Exercises Shoulder     Shoulder Exercises: Supine   Protraction PROM;5 reps   Horizontal  ABduction PROM;5 reps   External Rotation PROM;5 reps   Internal Rotation PROM;5 reps   Flexion PROM;5 reps   ABduction PROM;5 reps     Shoulder Exercises: Prone   Flexion Strengthening;12 reps   Flexion Weight (lbs) 1   Extension Strengthening;12 reps   Extension Weight (lbs) 1   Horizontal ABduction 1 Strengthening;12 reps   Horizontal ABduction 1 Weight (lbs) 1   Horizontal ABduction 2 Strengthening;12 reps   Horizontal ABduction 2 Weight (lbs) 1   Other Prone Exercises Y/scaption; 1# 12X     Shoulder Exercises: Standing   Protraction Strengthening;10 reps   Protraction Weight (lbs) 1   Horizontal ABduction Strengthening;10 reps   Horizontal ABduction Weight (lbs) 1   Horizontal ABduction Limitations mod tactile cues    External Rotation Strengthening;10 reps;Theraband;12 reps   Theraband Level (Shoulder External Rotation) Level 3 (Green)   External Rotation Weight (lbs) 1   Internal Rotation Strengthening;10 reps;Theraband;12 reps   Theraband Level (Shoulder Internal Rotation) Level 3 (Green)   Internal Rotation Weight (lbs) 1   Flexion Strengthening;10 reps   Shoulder Flexion Weight (lbs) 1   ABduction Strengthening;10 reps   Shoulder ABduction Weight (lbs) 1   ABduction Limitations min tactile cues    Extension Theraband;12 reps   Theraband Level (Shoulder Extension) Level 3 (Green)   Row [employer] reps   Theraband Level (Shoulder Row) Level 3 (Green)   Retraction Theraband;12 reps   Theraband Level (Shoulder Retraction) Level 3 (Green)     Shoulder Exercises: ROM/Strengthening   UBE (Upper Arm Bike) Level 2 2' forward 2' reverse   Wall Wash 1'   Wall Pushups 10 reps;Limitations   Wall Pushups Limitations tricep pushups with elbows by side.   Proximal Shoulder Strengthening, Seated 10 times with 1# resist no rest breaks    Ball on Wall Completed alphabet with shoulder at 90 degrees flexion then completed the alphabet again with shoulder abducted slightly lower  than 90 degrees .     Manual Therapy   Manual Therapy Myofascial release   Manual therapy comments Manual therapy completed prior to exercises   Myofascial Release myofascial release and manual stretching completed to left upper arm, trapeziu, and scapularis region to decrease fascial restrctions and increase joint                   OT Short Term Goals - 07/14/16 1312      OT SHORT TERM GOAL #1   Title Patient will be educated and independent with HEP to increase functional use of LUE during daily tasks.    Time 6   Period Weeks     OT SHORT TERM GOAL #2   Title Patient will increase P/ROM  to WNL to increase ability to get shirts on and off easier.    Time 6   Period Weeks     OT SHORT TERM GOAL #3   Title Patient will decrease pain to 5/10 when completing daily tasks with LUE.    Time 6   Period Weeks     OT SHORT TERM GOAL #4   Title Patient will increase strength to 3+/5 to increase ability to complete daily tasks at waist level.   Time 6   Period Weeks   Status Partially Met     OT SHORT TERM GOAL #5   Title Patient will decrease fascial restrictions to Mod amount in LUE to increase functional mobility needed for reaching tasks.    Time 6   Period Weeks           OT Long Term Goals - 07/14/16 1312      OT LONG TERM GOAL #1   Title Patient will return to highest level of independence with all daily tasks using LUE.   Time 12   Period Weeks   Status On-going     OT LONG TERM GOAL #2   Title Patient will increase LUE strength to 4/5 to increase ability to lift normal household items.   Time 12   Period Weeks   Status On-going     OT LONG TERM GOAL #3   Title Patient will increase LUE A/ROM to WNL to increase ability to complete reaching tasks overhead.   Time 12   Period Weeks   Status On-going     OT LONG TERM GOAL #4   Title Patient will decrease fascial restrictions to min amount or less to increase functional mobility needed for overhead  reaching tasks.    Time 12   Period Weeks     OT LONG TERM GOAL #5   Title Patient will decrease pain level to 2/10 or less when completing daily tasks.   Time 12   Period Weeks               Plan - 07/24/16 1043    Clinical Impression Statement A: Patient reports that MD appointment went well and he does not have to return to the MD until March 15th and then he will more than likely be released. PA informed patient that he might not gain his full strength.    Plan P: Continue to work on strengthening with less cueing to depress shoulder during functional reaching. Continue with sidelying with 1# weight.      Patient will benefit from skilled therapeutic intervention in order to improve the following deficits and impairments:  Pain, Impaired UE functional use, Increased fascial restricitons, Decreased range of motion, Increased edema  Visit Diagnosis: Other symptoms and signs involving the musculoskeletal system  Stiffness of left shoulder, not elsewhere classified  Acute pain of left shoulder    Problem List Patient Active Problem List   Diagnosis Date Noted  . Depression 03/10/2016  . Right arm pain 10/31/2014  . Overuse injury 10/31/2014  . Cervical disc disorder with radiculopathy of cervical region 10/31/2014  . IBS (irritable bowel syndrome) 10/19/2013  . Back pain 01/02/2013  . Hyperlipidemia LDL goal <130 09/20/2012  . Essential hypertension, benign 09/20/2012  . GERD (gastroesophageal reflux disease) 12/30/2011  . GRANULOMA 08/27/2009  . Dysphagia 06/17/2009  . PROSTATITIS, HX OF 06/17/2009   Ailene Ravel, OTR/L,CBIS  [phone]  07/24/2016, 10:49 AM  [place] [address]  [place], [place], [address] Phone: [phone]   Fax:  [phone]  Name: JONELL KRONTZ MRN: [id number] Date of Birth: 1951-04-12

## 2016-07-28 ENCOUNTER — Encounter (HOSPITAL_COMMUNITY): Payer: Self-pay

## 2016-07-28 ENCOUNTER — Ambulatory Visit (HOSPITAL_COMMUNITY): Payer: BLUE CROSS/BLUE SHIELD

## 2016-07-28 DIAGNOSIS — M25512 Pain in left shoulder: Secondary | ICD-10-CM | POA: Diagnosis not present

## 2016-07-28 DIAGNOSIS — M25612 Stiffness of left shoulder, not elsewhere classified: Secondary | ICD-10-CM

## 2016-07-28 DIAGNOSIS — R29898 Other symptoms and signs involving the musculoskeletal system: Secondary | ICD-10-CM

## 2016-07-28 NOTE — Therapy (Signed)
[facility] [place], [place], [address] Phone: [phone]   Fax:  [phone]  Occupational Therapy Treatment And reassessment Patient Details  Name: Adam [name] MRN: [id number] Date of Birth: 06/09/1950 Referring Provider: Dr. Earlie Server  Encounter Date: 07/28/2016      OT End of Session - 07/28/16 1008    Visit Number 22   Number of Visits 24   Date for OT Re-Evaluation 08/07/16   Authorization Type 1) Medicare 2) BSBC   Authorization Time Period before 28th visit   Authorization - Visit Number 22   Authorization - Number of Visits 28   OT Start Time [phone]  reassessment   OT Stop Time 1030   OT Time Calculation (min) 43 min   Activity Tolerance Patient tolerated treatment well   Behavior During Therapy [facility] for tasks assessed/performed      Past Medical History:  Diagnosis Date  . Allergy   . BMI 31.0-31.9,adult JUNE 2011 180 LBS   . Chronic back pain   . Depression   . Eosinophilic granuloma ([place]) JAN 2011 GASTRIC, followed by Dr. Eugenia Pancoast   EGD JAN 2011, JUN 2011, NOV 2011  . Gastric ulcer 08/31/2012  . GERD (gastroesophageal reflux disease)   . HTN (hypertension)   . Hyperlipemia   . IBS (irritable bowel syndrome)   . Prostate hypertrophy     Past Surgical History:  Procedure Laterality Date  . BACK SURGERY    . COLONOSCOPY  JAN 2011 COMPLETE   hypoTN and bradycardia w/ Demerol ,Phenergan and Versed, [place] TICS, SML IH  . COLONOSCOPY  2003   NUR-normal  . COLONOSCOPY  06/21/09   Fields-sigmoid diverticulosis, sm int hemorrhoids  . COLONOSCOPY N/A 10/24/2013   Normal mucosa in the terminal ileum Moderate diverticulosis noted in the sigmoid colonModerate sized internal hemorrhoids. negative random colon bx.. next TCS 10/2023  . Cyst Removed     from Left leg and knee  . ESOPHAGOGASTRODUODENOSCOPY  05/20/2012   SLF: MILD ESOPHAGITIS.NO BARRETT'S/Multiple ulcers ranging between 3-5 mm/MILD  Duodenal inflammation was found in the duodenal bulb  . ESOPHAGOGASTRODUODENOSCOPY N/A 09/05/2012   SLF: MILD Non-erosive gastritis (inflammation) in the gastric antrum. Biopsies benign, no H. pylori.  . ESOPHAGOGASTRODUODENOSCOPY N/A 10/24/2013   QJJ:HERDEYCX DISTAL ESOPHAGEAL WEBSmall hiatal herniaMILD Non-erosive gastritis. SB bx benign. gastric bx, minimal inflammation.   Marland Kitchen GANGLION CYST EXCISION     from L wrist and pins placed for Fx involving L thumb  . HAND SURGERY  at the age of 5   Brother accidentally cut it  . MALONEY DILATION N/A 10/24/2013   Procedure: MALONEY DILATION;  Surgeon: Danie Binder, MD;  [facility];  Service: Endoscopy;  Laterality: N/A;  . MASS EXCISION  04/29/2012   Procedure: EXCISION MASS;  Surgeon: Donato Heinz, MD;  Location: [facility];  Service: General;  Laterality: Right;  Excision of Vascular Mass Right Arm  . SAVORY DILATION N/A 10/24/2013   Procedure: SAVORY DILATION;  Surgeon: Danie Binder, MD;  [facility];  Service: Endoscopy;  Laterality: N/A;  . TUMOR REMOVAL  1994   Benign [facility] brain at [facility]  . UPPER GASTROINTESTINAL ENDOSCOPY  JAN 2011   GASTRIC ULCER-EOS GRANULOMA  . UPPER GASTROINTESTINAL ENDOSCOPY  JUN 2011   GASTRIC ULCER- EOS, NO H. PYLORI  . UPPER GASTROINTESTINAL ENDOSCOPY  NOV 2011   GASTRIC NODULE, no ulcer- EOS    There were no vitals filed  for this visit.      Subjective Assessment - 07/28/16 1007    Subjective  S: I feel like I can discharge after my next two sessions.   Currently in Pain? No/denies            [name] OT Assessment - 07/28/16 0951      Assessment   Diagnosis Left shoulder RTC repair     Precautions   Precautions Shoulder   Type of Shoulder Precautions Progress as tolerated     AROM   Overall AROM Comments Assessed seated. IR/er adducted   AROM Assessment Site Shoulder   Right/Left Shoulder Left   Left Shoulder Flexion 165 Degrees  previous: 163   Left  Shoulder ABduction 167 Degrees  previous: 158   Left Shoulder Internal Rotation 90 Degrees  previous: 90   Left Shoulder External Rotation 60 Degrees  previous: 58     Strength   Overall Strength Comments Assessed seated. IR/er adducted.   Strength Assessment Site Shoulder   Right/Left Shoulder Left   Left Shoulder Flexion 4-/5  previous: 4-/5   Left Shoulder ABduction 3+/5  previous: same   Left Shoulder Internal Rotation 5/5  previous: 4/5   Left Shoulder External Rotation 3+/5  previous: 4-/5                  OT Treatments/Exercises (OP) - 07/28/16 1004      Exercises   Exercises Shoulder     Shoulder Exercises: Supine   Protraction PROM;5 reps   Horizontal ABduction PROM;5 reps   External Rotation PROM;5 reps   Internal Rotation PROM;5 reps   Flexion PROM;5 reps   ABduction PROM;5 reps     Shoulder Exercises: Sidelying   External Rotation Strengthening;12 reps   External Rotation Weight (lbs) 2   Internal Rotation Strengthening;12 reps   Internal Rotation Weight (lbs) 2   Flexion Strengthening;12 reps   Flexion Weight (lbs) 2   ABduction Strengthening;12 reps   ABduction Weight (lbs) 2   Other Sidelying Exercises Protraction; 12X; 2#     Shoulder Exercises: Standing   External Rotation Theraband;12 reps   Theraband Level (Shoulder External Rotation) Level 3 (Green)   Internal Rotation Theraband;12 reps   Theraband Level (Shoulder Internal Rotation) Level 3 (Green)   Extension [employer] reps   Theraband Level (Shoulder Extension) Level 3 (Green)   Row [employer] reps   Theraband Level (Shoulder Row) Level 3 (Green)   Retraction Theraband;12 reps   Theraband Level (Shoulder Retraction) Level 3 (Green)     Shoulder Exercises: ROM/Strengthening   Cybex Press 2 plate;10 reps  1plate 10X, 1.5 plate 10X   Wall Wash 1'   Over Head Lace 2'   Wall Pushups Limitations  12X   Wall Pushups Limitations tricep pushups with elbows by side.   "W"  Arms 10X A/ROM   X to V Arms 10X A/ROM    Ball on Wall Completed alphabet with shoulder at 90 degrees flexion then completed the alphabet again with shoulder abducted slightly lower than 90 degrees .                  OT Short Term Goals - 07/28/16 1000      OT SHORT TERM GOAL #1   Title Patient will be educated and independent with HEP to increase functional use of LUE during daily tasks.    Time 6   Period Weeks     OT SHORT TERM GOAL #2   Title Patient  will increase P/ROM to WNL to increase ability to get shirts on and off easier.    Time 6   Period Weeks     OT SHORT TERM GOAL #3   Title Patient will decrease pain to 5/10 when completing daily tasks with LUE.    Time 6   Period Weeks     OT SHORT TERM GOAL #4   Title Patient will increase strength to 3+/5 to increase ability to complete daily tasks at waist level.   Time 6   Period Weeks   Status Achieved     OT SHORT TERM GOAL #5   Title Patient will decrease fascial restrictions to Mod amount in LUE to increase functional mobility needed for reaching tasks.    Time 6   Period Weeks           OT Long Term Goals - 07/28/16 1000      OT LONG TERM GOAL #1   Title Patient will return to highest level of independence with all daily tasks using LUE.   Time 12   Period Weeks   Status On-going     OT LONG TERM GOAL #2   Title Patient will increase LUE strength to 4/5 to increase ability to lift normal household items.   Time 12   Period Weeks   Status On-going     OT LONG TERM GOAL #3   Title Patient will increase LUE A/ROM to WNL to increase ability to complete reaching tasks overhead.   Time 12   Period Weeks   Status Achieved     OT LONG TERM GOAL #4   Title Patient will decrease fascial restrictions to min amount or less to increase functional mobility needed for overhead reaching tasks.    Time 12   Period Weeks     OT LONG TERM GOAL #5   Title Patient will decrease pain level to 2/10 or  less when completing daily tasks.   Time 12   Period Weeks               Plan - 07/28/16 1150    Clinical Impression Statement A: Patient states that he has noticed an improvement overall with strength and ROM abilities. He is not using his LUE for all daily tasks. Continues to be limited with shoulder strength and stability. Patient has met 3/5 long term goals. Discussed progress in therapy and patient would like to finish with last two session before discharging with HEP.   Plan P: Continue with therapy and complete next two sessions before discharge. Provide patient with HEP next session that will be needed at discharge.      Patient will benefit from skilled therapeutic intervention in order to improve the following deficits and impairments:  Pain, Impaired UE functional use, Increased fascial restricitons, Decreased range of motion, Increased edema  Visit Diagnosis: Other symptoms and signs involving the musculoskeletal system - Plan: Ot plan of care cert/re-cert  Stiffness of left shoulder, not elsewhere classified - Plan: Ot plan of care cert/re-cert  Acute pain of left shoulder - Plan: Ot plan of care cert/re-cert    Problem List Patient Active Problem List   Diagnosis Date Noted  . Depression 03/10/2016  . Right arm pain 10/31/2014  . Overuse injury 10/31/2014  . Cervical disc disorder with radiculopathy of cervical region 10/31/2014  . IBS (irritable bowel syndrome) 10/19/2013  . Back pain 01/02/2013  . Hyperlipidemia LDL goal <130 09/20/2012  . Essential hypertension, benign 09/20/2012  .  GERD (gastroesophageal reflux disease) 12/30/2011  . GRANULOMA 08/27/2009  . Dysphagia 06/17/2009  . PROSTATITIS, HX OF 06/17/2009    Ailene Ravel, OTR/L,CBIS  [phone]   07/28/2016, 11:55 AM  [facility] [address] [place], [place], [address] Phone: [phone]   Fax:  [phone]  Name: BUNYAN BRIER MRN: [id number] Date of Birth: 1950/10/30

## 2016-07-30 ENCOUNTER — Encounter (HOSPITAL_COMMUNITY): Payer: Self-pay

## 2016-07-30 ENCOUNTER — Ambulatory Visit (HOSPITAL_COMMUNITY): Payer: BLUE CROSS/BLUE SHIELD

## 2016-07-30 DIAGNOSIS — M25512 Pain in left shoulder: Secondary | ICD-10-CM | POA: Diagnosis not present

## 2016-07-30 DIAGNOSIS — M25612 Stiffness of left shoulder, not elsewhere classified: Secondary | ICD-10-CM

## 2016-07-30 DIAGNOSIS — R29898 Other symptoms and signs involving the musculoskeletal system: Secondary | ICD-10-CM | POA: Diagnosis not present

## 2016-07-30 NOTE — Patient Instructions (Signed)
EXTERNAL ROTATION STRETCH  Place arm along edge of door as pictured.  Keep elbow and shoulder at 90 degree angles.  Gently lean the upper torso forward until a stretch is felt in the shoulder. Hold _10__ seconds; relax; repeat 2 times    Wall Slide Shoulder Flexion  Stand facing wall, walk fingers up the wall until you feel a good stretch. Hold for 10 seconds. Slowly walk your fingers back down the wall. Hold for 10 seconds and repeat 2 times.     ELASTIC BAND SHOULDER EXTENSION  While holding an elastic band in front of you with your elbows straight, pull the band down and back towards your side.  10-15 times.    Shoulder Adduction  With arm extended and the elbow locked pull the band toward the hip and squeeze the arm into the body.  10-15 times     Horizontal Abduction. Shoulder  Hold resistive band keeping shoulders down. Slowly pull band laterally engaging scapular musculature until feeling the squeeze between shoulder blade.   10-15 times.    ELASTIC BAND SHOULDER EXTERNAL ROTATION - ER  While holding an elastic band at your side with your elbow bent, start with your hand near your stomach and then pull the band away. Keep your elbow at your side the entire time.  10-15 times    ELASTIC BAND SHOULDER FLEXION  While holding an elastic band at your side, draw up your arm up in front of you keeping your elbow straight. 10-15 times.     ELASTIC BAND SHOULDER ABDUCTION  While holding an elastic band at your side, draw up your arm to the side keeping your elbow straight.  10-15 times

## 2016-07-30 NOTE — Therapy (Signed)
[place], [place], [address] Phone: [phone]   Fax:  [phone]  Occupational Therapy Treatment  Patient Details  Name: Adam [name] MRN: [id number] Date of Birth: [birth year] Referring Provider: Dr. Earlie Server  Encounter Date: 07/30/2016      OT End of Session - 07/30/16 1021    Visit Number 23   Number of Visits 24   Date for OT Re-Evaluation 08/07/16   Authorization Type 1) Medicare 2) BSBC   Authorization Time Period before 28th visit   Authorization - Visit Number 23   Authorization - Number of Visits 28   OT Start Time [phone]   OT Stop Time 1030   OT Time Calculation (min) 44 min   Activity Tolerance Patient tolerated treatment well   Behavior During Therapy [facility] for tasks assessed/performed      Past Medical History:  Diagnosis Date  . Allergy   . BMI 31.0-31.9,adult JUNE 2011 180 LBS   . Chronic back pain   . Depression   . Eosinophilic granuloma ([place]) JAN 2011 GASTRIC, followed by Dr. Eugenia Pancoast   EGD JAN 2011, JUN 2011, NOV 2011  . Gastric ulcer 08/31/2012  . GERD (gastroesophageal reflux disease)   . HTN (hypertension)   . Hyperlipemia   . IBS (irritable bowel syndrome)   . Prostate hypertrophy     Past Surgical History:  Procedure Laterality Date  . BACK SURGERY    . COLONOSCOPY  JAN 2011 COMPLETE   hypoTN and bradycardia w/ Demerol ,Phenergan and Versed, [place] TICS, SML IH  . COLONOSCOPY  2003   NUR-normal  . COLONOSCOPY  06/21/09   Fields-sigmoid diverticulosis, sm int hemorrhoids  . COLONOSCOPY N/A 10/24/2013   Normal mucosa in the terminal ileum Moderate diverticulosis noted in the sigmoid colonModerate sized internal hemorrhoids. negative random colon bx.. next TCS 10/2023  . Cyst Removed     from Left leg and knee  . ESOPHAGOGASTRODUODENOSCOPY  05/20/2012   SLF: MILD ESOPHAGITIS.NO BARRETT'S/Multiple ulcers ranging between 3-5 mm/MILD Duodenal inflammation was found in  the duodenal bulb  . ESOPHAGOGASTRODUODENOSCOPY N/A 09/05/2012   SLF: MILD Non-erosive gastritis (inflammation) in the gastric antrum. Biopsies benign, no H. pylori.  . ESOPHAGOGASTRODUODENOSCOPY N/A 10/24/2013   [id number] DISTAL ESOPHAGEAL WEBSmall hiatal herniaMILD Non-erosive gastritis. SB bx benign. gastric bx, minimal inflammation.   Marland Kitchen GANGLION CYST EXCISION     from L wrist and pins placed for Fx involving L thumb  . HAND SURGERY  at the age of 51   Brother accidentally cut it  . MALONEY DILATION N/A 10/24/2013   Procedure: MALONEY DILATION;  Surgeon: Danie Binder, MD;  [facility];  Service: Endoscopy;  Laterality: N/A;  . MASS EXCISION  04/29/2012   Procedure: EXCISION MASS;  Surgeon: Donato Heinz, MD;  Location: [facility];  Service: General;  Laterality: Right;  Excision of Vascular Mass Right Arm  . SAVORY DILATION N/A 10/24/2013   Procedure: SAVORY DILATION;  Surgeon: Danie Binder, MD;  [facility];  Service: Endoscopy;  Laterality: N/A;  . TUMOR REMOVAL  1994   Benign from brain at [facility]  . UPPER GASTROINTESTINAL ENDOSCOPY  JAN 2011   GASTRIC ULCER-EOS GRANULOMA  . UPPER GASTROINTESTINAL ENDOSCOPY  JUN 2011   GASTRIC ULCER- EOS, NO H. PYLORI  . UPPER GASTROINTESTINAL ENDOSCOPY  NOV 2011   GASTRIC NODULE, no ulcer- EOS    There were no vitals filed for this visit.  Subjective Assessment - 07/30/16 0948    Currently in Pain? No/denies                      OT Treatments/Exercises (OP) - 07/30/16 0954      Exercises   Exercises Shoulder     Shoulder Exercises: Supine   Protraction PROM;5 reps   Horizontal ABduction PROM;5 reps   External Rotation PROM;5 reps   Internal Rotation PROM;5 reps   Flexion PROM;5 reps   ABduction PROM;5 reps     Shoulder Exercises: Sidelying   External Rotation Strengthening;12 reps   External Rotation Weight (lbs) 2   Internal Rotation Strengthening;12 reps   Internal Rotation  Weight (lbs) 2   Flexion Strengthening;12 reps   Flexion Weight (lbs) 2   ABduction Strengthening;12 reps   ABduction Weight (lbs) 2   Other Sidelying Exercises Protraction; 15X; 2#     Shoulder Exercises: ROM/Strengthening   UBE (Upper Arm Bike) Level 3 2' forward 2' reverse   Cybex Press 2 plate;10 reps   Cybex Row 2 plate;10 reps   "W" Arms 12X A/ROM   X to V Arms 12X A/ROM                 OT Education - 07/30/16 1013    Education provided Yes   Education Details Red theraband strengthening and stretches to prepare for discharge   Person(s) Educated Patient   Methods Explanation;Demonstration;Verbal cues;Handout   Comprehension Returned demonstration;Verbalized understanding          OT Short Term Goals - 07/28/16 1000      OT SHORT TERM GOAL #1   Title Patient will be educated and independent with HEP to increase functional use of LUE during daily tasks.    Time 6   Period Weeks     OT SHORT TERM GOAL #2   Title Patient will increase P/ROM to WNL to increase ability to get shirts on and off easier.    Time 6   Period Weeks     OT SHORT TERM GOAL #3   Title Patient will decrease pain to 5/10 when completing daily tasks with LUE.    Time 6   Period Weeks     OT SHORT TERM GOAL #4   Title Patient will increase strength to 3+/5 to increase ability to complete daily tasks at waist level.   Time 6   Period Weeks   Status Achieved     OT SHORT TERM GOAL #5   Title Patient will decrease fascial restrictions to Mod amount in LUE to increase functional mobility needed for reaching tasks.    Time 6   Period Weeks           OT Long Term Goals - 07/28/16 1000      OT LONG TERM GOAL #1   Title Patient will return to highest level of independence with all daily tasks using LUE.   Time 12   Period Weeks   Status On-going     OT LONG TERM GOAL #2   Title Patient will increase LUE strength to 4/5 to increase ability to lift normal household items.    Time 12   Period Weeks   Status On-going     OT LONG TERM GOAL #3   Title Patient will increase LUE A/ROM to WNL to increase ability to complete reaching tasks overhead.   Time 12   Period Weeks   Status Achieved     OT  LONG TERM GOAL #4   Title Patient will decrease fascial restrictions to min amount or less to increase functional mobility needed for overhead reaching tasks.    Time 12   Period Weeks     OT LONG TERM GOAL #5   Title Patient will decrease pain level to 2/10 or less when completing daily tasks.   Time 12   Period Weeks               Plan - 07/30/16 1022    Clinical Impression Statement A: Patient was given an updated HEP to prepare for discharge. Exercises reviewed and VC were given for form and technique.   Plan P: Complete FOTO and discharge next session. Follow up on HEP.      Patient will benefit from skilled therapeutic intervention in order to improve the following deficits and impairments:  Pain, Impaired UE functional use, Increased fascial restricitons, Decreased range of motion, Increased edema  Visit Diagnosis: Other symptoms and signs involving the musculoskeletal system  Stiffness of left shoulder, not elsewhere classified  Acute pain of left shoulder    Problem List Patient Active Problem List   Diagnosis Date Noted  . Depression 03/10/2016  . Right arm pain 10/31/2014  . Overuse injury 10/31/2014  . Cervical disc disorder with radiculopathy of cervical region 10/31/2014  . IBS (irritable bowel syndrome) 10/19/2013  . Back pain 01/02/2013  . Hyperlipidemia LDL goal <130 09/20/2012  . Essential hypertension, benign 09/20/2012  . GERD (gastroesophageal reflux disease) 12/30/2011  . GRANULOMA 08/27/2009  . Dysphagia 06/17/2009  . PROSTATITIS, HX OF 06/17/2009   Ailene Ravel, OTR/L,CBIS  [phone]  07/30/2016, 10:25 AM  [facility] [address] [place], [place],  [address] Phone: [phone]   Fax:  [phone]  Name: Adam [name] MRN: [id number] Date of Birth: 1950-12-27

## 2016-08-04 ENCOUNTER — Telehealth (HOSPITAL_COMMUNITY): Payer: Self-pay | Admitting: Family Medicine

## 2016-08-04 ENCOUNTER — Ambulatory Visit (HOSPITAL_COMMUNITY): Payer: BLUE CROSS/BLUE SHIELD

## 2016-08-04 NOTE — Telephone Encounter (Signed)
08/04/16 pt came in and we cx today's appt because he had a drs appt and rescheduled for 2/28

## 2016-08-05 ENCOUNTER — Encounter (HOSPITAL_COMMUNITY): Payer: Self-pay

## 2016-08-05 ENCOUNTER — Ambulatory Visit (HOSPITAL_COMMUNITY): Payer: BLUE CROSS/BLUE SHIELD

## 2016-08-05 DIAGNOSIS — M25612 Stiffness of left shoulder, not elsewhere classified: Secondary | ICD-10-CM | POA: Diagnosis not present

## 2016-08-05 DIAGNOSIS — R29898 Other symptoms and signs involving the musculoskeletal system: Secondary | ICD-10-CM

## 2016-08-05 DIAGNOSIS — M25512 Pain in left shoulder: Secondary | ICD-10-CM

## 2016-08-05 NOTE — Therapy (Signed)
[facility] [address] [place], [place], [address] Phone: [phone]   Fax:  [phone]  Occupational Therapy Treatment  Patient Details  Name: Adam [name] MRN: [id number] Date of Birth: 09-26-1950 Referring Provider: Dr. Earlie Server  Encounter Date: 08/05/2016      OT End of Session - 08/05/16 1023    Visit Number 24   Number of Visits 24   Authorization Type 1) Medicare 2) BSBC   Authorization Time Period before 28th visit   Authorization - Visit Number 24   Authorization - Number of Visits 28   OT Start Time [phone]  discharge today   OT Stop Time 1015   OT Time Calculation (min) 28 min   Activity Tolerance Patient tolerated treatment well   Behavior During Therapy [facility] for tasks assessed/performed      Past Medical History:  Diagnosis Date  . Allergy   . BMI 31.0-31.9,adult JUNE 2011 180 LBS   . Chronic back pain   . Depression   . Eosinophilic granuloma ([place]) JAN 2011 GASTRIC, followed by Dr. Eugenia Pancoast   EGD JAN 2011, JUN 2011, NOV 2011  . Gastric ulcer 08/31/2012  . GERD (gastroesophageal reflux disease)   . HTN (hypertension)   . Hyperlipemia   . IBS (irritable bowel syndrome)   . Prostate hypertrophy     Past Surgical History:  Procedure Laterality Date  . BACK SURGERY    . COLONOSCOPY  JAN 2011 COMPLETE   hypoTN and bradycardia w/ Demerol ,Phenergan and Versed, [place] TICS, S[facility] IH  . COLONOSCOPY  2003   NUR-normal  . COLONOSCOPY  06/21/09   Fields-sigmoid diverticulosis, sm int hemorrhoids  . COLONOSCOPY N/A 10/24/2013   Normal mucosa in the terminal ileum Moderate diverticulosis noted in the sigmoid colonModerate sized internal hemorrhoids. negative random colon bx.. next TCS 10/2023  . Cyst Removed     from Left leg and knee  . ESOPHAGOGASTRODUODENOSCOPY  05/20/2012   SLF: MILD ESOPHAGITIS.NO BARRETT'S/Multiple ulcers ranging between 3-5 mm/MILD Duodenal inflammation was found in the duodenal bulb  .  ESOPHAGOGASTRODUODENOSCOPY N/A 09/05/2012   SLF: MILD Non-erosive gastritis (inflammation) in the gastric antrum. Biopsies benign, no H. pylori.  . ESOPHAGOGASTRODUODENOSCOPY N/A 10/24/2013   CLE:[id number] DISTAL ESOPHAGEAL WEBSmall hiatal herniaMILD Non-erosive gastritis. SB bx benign. gastric bx, minimal inflammation.   Marland Kitchen GANGLION CYST EXCISION     from L wrist and pins placed for Fx involving L thumb  . HAND SURGERY  at the age of 89   Brother accidentally cut it  . MALONEY DILATION N/A 10/24/2013   Procedure: MALONEY DILATION;  Surgeon: Danie Binder, MD;  [facility];  Service: Endoscopy;  Laterality: N/A;  . MASS EXCISION  04/29/2012   Procedure: EXCISION MASS;  Surgeon: Donato Heinz, MD;  Location: [facility];  Service: General;  Laterality: Right;  Excision of Vascular Mass Right Arm  . SAVORY DILATION N/A 10/24/2013   Procedure: SAVORY DILATION;  Surgeon: Danie Binder, MD;  Location: [facility];  Service: Endoscopy;  Laterality: N/A;  . TUMOR REMOVAL  1994   Benign [facility]  . UPPER GASTROINTESTINAL ENDOSCOPY  JAN 2011   GASTRIC ULCER-EOS GRANULOMA  . UPPER GASTROINTESTINAL ENDOSCOPY  JUN 2011   GASTRIC ULCER- EOS, NO H. PYLORI  . UPPER GASTROINTESTINAL ENDOSCOPY  NOV 2011   GASTRIC NODULE, no ulcer- EOS    There were no vitals filed for this visit.  Subjective Assessment - 08/05/16 1022    Subjective  S: I think I was doing the shoulder stretch wrong.   Special Tests FOTO score: 62/100   Currently in Pain? No/denies            [facility] OT Assessment - 08/05/16 7622      Assessment   Diagnosis Left shoulder RTC repair     Precautions   Precautions Shoulder   Type of Shoulder Precautions Progress as tolerated      These measurements were taken on 07/28/16 during reassessment:  AROM    Overall AROM Comments Assessed seated. IR/er adducted   AROM Assessment Site Shoulder   Right/Left Shoulder Left   Left Shoulder Flexion  165 Degrees  previous: 163   Left Shoulder ABduction 167 Degrees  previous: 158   Left Shoulder Internal Rotation 90 Degrees  previous: 90   Left Shoulder External Rotation 60 Degrees  previous: 58       Strength   Overall Strength Comments Assessed seated. IR/er adducted.   Strength Assessment Site Shoulder   Right/Left Shoulder Left   Left Shoulder Flexion 4-/5  previous: 4-/5   Left Shoulder ABduction 3+/5  previous: same   Left Shoulder Internal Rotation 5/5  previous: 4/5   Left Shoulder External Rotation 3+/5  previous: 4-/5                OT Treatments/Exercises (OP) - 08/05/16 1009      Exercises   Exercises Shoulder     Shoulder Exercises: Supine   Protraction PROM;5 reps   Horizontal ABduction PROM;5 reps   External Rotation PROM;5 reps   Internal Rotation PROM;5 reps   Flexion PROM;5 reps   ABduction PROM;5 reps     Shoulder Exercises: Standing   Horizontal ABduction Theraband;10 reps   Theraband Level (Shoulder Horizontal ABduction) Level 2 (Red)   External Rotation Theraband;10 reps   Theraband Level (Shoulder External Rotation) Level 2 (Red)   Flexion Theraband;10 reps   Theraband Level (Shoulder Flexion) Level 2 (Red)   ABduction Theraband;10 reps   Theraband Level (Shoulder ABduction) Level 2 (Red)   Extension Theraband;10 reps   Theraband Level (Shoulder Extension) Level 2 (Red)                OT Education - 08/05/16 1022    Education provided Yes   Education Details Patient was given green theraband to progress to once red is too easy. Discussed frequnecy of HEP and VC were given for exercisess and stretches in relation to form and technique.   Person(s) Educated Patient   Methods Explanation;Verbal cues;Demonstration   Comprehension Verbalized understanding;Returned demonstration          OT Short Term Goals - 08/05/16 1025      OT SHORT TERM GOAL #1   Title Patient will be educated and independent with  HEP to increase functional use of LUE during daily tasks.    Time 6   Period Weeks     OT SHORT TERM GOAL #2   Title Patient will increase P/ROM to WNL to increase ability to get shirts on and off easier.    Time 6   Period Weeks     OT SHORT TERM GOAL #3   Title Patient will decrease pain to 5/10 when completing daily tasks with LUE.    Time 6   Period Weeks     OT SHORT TERM GOAL #4   Title Patient will increase strength to 3+/5 to increase  ability to complete daily tasks at waist level.   Time 6   Period Weeks     OT SHORT TERM GOAL #5   Title Patient will decrease fascial restrictions to Mod amount in LUE to increase functional mobility needed for reaching tasks.    Time 6   Period Weeks           OT Long Term Goals - August 09, 2016 1025      OT LONG TERM GOAL #1   Title Patient will return to highest level of independence with all daily tasks using LUE.   Time 12   Period Weeks   Status Achieved     OT LONG TERM GOAL #2   Title Patient will increase LUE strength to 4/5 to increase ability to lift normal household items.   Time 12   Period Weeks   Status Not Met     OT LONG TERM GOAL #3   Title Patient will increase LUE A/ROM to WNL to increase ability to complete reaching tasks overhead.   Time 12   Period Weeks     OT LONG TERM GOAL #4   Title Patient will decrease fascial restrictions to min amount or less to increase functional mobility needed for overhead reaching tasks.    Time 12   Period Weeks     OT LONG TERM GOAL #5   Title Patient will decrease pain level to 2/10 or less when completing daily tasks.   Time 12   Period Weeks               Plan - Aug 09, 2016 1023    Clinical Impression Statement A: Reviewed HEP that was given last session. patient required some VC for form and technique during exercises and shoulder stretches. Discussed any precautions and informed patient that he is able to do what whatever he is able to tolerate. Pt is in  agreement of discharge.    Plan P: Discharge from therapy with HEP.       Patient will benefit from skilled therapeutic intervention in order to improve the following deficits and impairments:  Pain, Impaired UE functional use, Increased fascial restricitons, Decreased range of motion, Increased edema  Visit Diagnosis: Other symptoms and signs involving the musculoskeletal system  Stiffness of left shoulder, not elsewhere classified  Acute pain of left shoulder      G-Codes - 08/09/2016 1025    Functional Assessment Tool Used (Outpatient only) FOTO score: 62/100 (38% impaired)   Functional Limitation Carrying, moving and handling objects   Carrying, Moving and Handling Objects Goal Status ([id number]) At least 20 percent but less than 40 percent impaired, limited or restricted   Carrying, Moving and Handling Objects Discharge Status [phone]) At least 20 percent but less than 40 percent impaired, limited or restricted      Problem List Patient Active Problem List   Diagnosis Date Noted  . Depression 03/10/2016  . Right arm pain 10/31/2014  . Overuse injury 10/31/2014  . Cervical disc disorder with radiculopathy of cervical region 10/31/2014  . IBS (irritable bowel syndrome) 10/19/2013  . Back pain 01/02/2013  . Hyperlipidemia LDL goal <130 09/20/2012  . Essential hypertension, benign 09/20/2012  . GERD (gastroesophageal reflux disease) 12/30/2011  . GRANULOMA 08/27/2009  . Dysphagia 06/17/2009  . PROSTATITIS, HX OF 06/17/2009    OCCUPATIONAL THERAPY DISCHARGE SUMMARY  Visits from Start of Care: 24  Current functional level related to goals / functional outcomes: See above   Remaining deficits: Pt has  deficits during shoulder flexion and abduction as he is unable to complete full range without utilizing compensatory movements in shoulder and/or bending elbow to off load arm. Patient did not achieve his long term strength goal due to this.    Education / Equipment: See  above Plan: Patient agrees to discharge.  Patient goals were met. Patient is being discharged due to meeting the stated rehab goals.  ?????        Ailene Ravel, OTR/L,CBIS  [phone]  08/05/2016, 10:26 AM  [place], [place], [address] Phone: [phone]   Fax:  [phone]  Name: AZARYAH OLEKSY MRN: [id number] Date of Birth: 09/30/1950

## 2016-08-06 ENCOUNTER — Ambulatory Visit (INDEPENDENT_AMBULATORY_CARE_PROVIDER_SITE_OTHER): Payer: BLUE CROSS/BLUE SHIELD | Admitting: Family Medicine

## 2016-08-06 ENCOUNTER — Encounter: Payer: Self-pay | Admitting: Family Medicine

## 2016-08-06 VITALS — BP 144/94 | Temp 98.8°F | Ht 67.0 in | Wt 180.0 lb

## 2016-08-06 DIAGNOSIS — L03317 Cellulitis of buttock: Secondary | ICD-10-CM | POA: Diagnosis not present

## 2016-08-06 MED ORDER — AMOXICILLIN-POT CLAVULANATE 875-125 MG PO TABS: 1.0000 | ORAL_TABLET | Freq: Two times a day (BID) | ORAL | 0 refills | Status: DC

## 2016-08-06 NOTE — Progress Notes (Signed)
   Subjective:    Patient ID: Adam [name], male    DOB: [birth year], 66 y.o.   MRN: [id number]  HPI Cyst on right buttock. Came up 2 days ago.  The patient relates that he had an area on his right buttock that just came up over the past couple days he relates it is painful and tender he has paying more attention to it over the past couple days as it's become a little bit larger and more painful he denies having this problem in the past. Denies sweats chills fevers. No vomiting.  Review of Systems    please see above Objective:   Physical Exam Lungs are clear hearts regular pulse normal patient with what appears to be a developing abscess on the right buttock right now it is in a firm stage consistent with a localized cellulitis no abscess is noted at this point       Assessment & Plan:  Warm compresses 20 minutes at a time every 2 hours Augmentin 875 twice a day for the next 10 days If this area in large and's and becomes fluctuant he needs to follow-up for drainage he understands this

## 2016-08-10 ENCOUNTER — Telehealth: Payer: Self-pay | Admitting: Family Medicine

## 2016-08-10 MED ORDER — DOXYCYCLINE HYCLATE 100 MG PO TABS: ORAL_TABLET | ORAL | 0 refills | Status: DC

## 2016-08-10 NOTE — Telephone Encounter (Signed)
Spoke with patient and informed him per Dr.Steve Luking- We are changing medication to Doxycycline 100 mg twice a day for 10 days. Add otc probiotic. Patient verbalized understanding. Medication sent into pharmacy.

## 2016-08-10 NOTE — Telephone Encounter (Signed)
Patient was seen on 08/06/16 for cellulitis of buttock by Dr. Nicki Reaper.  The amoxicillin he was prescribed is giving him really bad diarrhea.  Please advise.  [facility]

## 2016-08-10 NOTE — Telephone Encounter (Signed)
Change to doxy 100 bid ten d add otc probiotic

## 2016-08-20 DIAGNOSIS — M19012 Primary osteoarthritis, left shoulder: Secondary | ICD-10-CM | POA: Diagnosis not present

## 2016-09-08 ENCOUNTER — Ambulatory Visit (INDEPENDENT_AMBULATORY_CARE_PROVIDER_SITE_OTHER): Payer: BLUE CROSS/BLUE SHIELD | Admitting: Urology

## 2016-09-08 DIAGNOSIS — N401 Enlarged prostate with lower urinary tract symptoms: Secondary | ICD-10-CM

## 2016-09-17 ENCOUNTER — Other Ambulatory Visit: Payer: Self-pay | Admitting: Family Medicine

## 2016-10-17 ENCOUNTER — Other Ambulatory Visit: Payer: Self-pay | Admitting: Family Medicine

## 2016-11-19 ENCOUNTER — Other Ambulatory Visit: Payer: Self-pay | Admitting: Family Medicine

## 2016-12-01 ENCOUNTER — Other Ambulatory Visit: Payer: Self-pay | Admitting: Family Medicine

## 2016-12-17 ENCOUNTER — Other Ambulatory Visit: Payer: Self-pay

## 2016-12-18 ENCOUNTER — Other Ambulatory Visit: Payer: Self-pay | Admitting: Family Medicine

## 2016-12-18 MED ORDER — ENALAPRIL MALEATE 20 MG PO TABS: 20.0000 mg | ORAL_TABLET | Freq: Every day | ORAL | 0 refills | Status: DC

## 2016-12-18 MED ORDER — PAROXETINE HCL 20 MG PO TABS: 20.0000 mg | ORAL_TABLET | Freq: Every day | ORAL | 0 refills | Status: DC

## 2016-12-18 NOTE — Telephone Encounter (Signed)
Call pt, sched appt, thirty days both no refill

## 2016-12-18 NOTE — Telephone Encounter (Signed)
See other

## 2016-12-21 ENCOUNTER — Other Ambulatory Visit: Payer: Self-pay | Admitting: Gastroenterology

## 2016-12-21 NOTE — Telephone Encounter (Signed)
Please tell the patient I will send in a limited refill to last a couple months. However, we have not seen him in about 2 years and he will need an office visit in order to obtain further refills.

## 2016-12-21 NOTE — Telephone Encounter (Signed)
PT is aware and Erline Levine is scheduling appt for him.

## 2016-12-31 ENCOUNTER — Other Ambulatory Visit: Payer: Self-pay | Admitting: Family Medicine

## 2016-12-31 NOTE — Telephone Encounter (Signed)
Call pt to sched wellness plus chronic(make sure front schedules both), once they do thirty d worth of meds

## 2017-01-15 ENCOUNTER — Other Ambulatory Visit: Payer: Self-pay | Admitting: Family Medicine

## 2017-01-20 ENCOUNTER — Ambulatory Visit (INDEPENDENT_AMBULATORY_CARE_PROVIDER_SITE_OTHER): Payer: BLUE CROSS/BLUE SHIELD | Admitting: Family Medicine

## 2017-01-20 ENCOUNTER — Encounter: Payer: Self-pay | Admitting: Family Medicine

## 2017-01-20 VITALS — BP 142/92 | Ht 67.0 in | Wt 175.0 lb

## 2017-01-20 DIAGNOSIS — Z125 Encounter for screening for malignant neoplasm of prostate: Secondary | ICD-10-CM | POA: Diagnosis not present

## 2017-01-20 DIAGNOSIS — F320 Major depressive disorder, single episode, mild: Secondary | ICD-10-CM

## 2017-01-20 DIAGNOSIS — F329 Major depressive disorder, single episode, unspecified: Secondary | ICD-10-CM | POA: Diagnosis not present

## 2017-01-20 DIAGNOSIS — E7800 Pure hypercholesterolemia, unspecified: Secondary | ICD-10-CM | POA: Diagnosis not present

## 2017-01-20 DIAGNOSIS — Z79899 Other long term (current) drug therapy: Secondary | ICD-10-CM | POA: Diagnosis not present

## 2017-01-20 DIAGNOSIS — Z0000 Encounter for general adult medical examination without abnormal findings: Secondary | ICD-10-CM

## 2017-01-20 DIAGNOSIS — Z13220 Encounter for screening for lipoid disorders: Secondary | ICD-10-CM | POA: Diagnosis not present

## 2017-01-20 DIAGNOSIS — F32A Depression, unspecified: Secondary | ICD-10-CM

## 2017-01-20 DIAGNOSIS — E785 Hyperlipidemia, unspecified: Secondary | ICD-10-CM | POA: Diagnosis not present

## 2017-01-20 DIAGNOSIS — I10 Essential (primary) hypertension: Secondary | ICD-10-CM

## 2017-01-20 MED ORDER — PAROXETINE HCL 20 MG PO TABS: 20.0000 mg | ORAL_TABLET | Freq: Every day | ORAL | 5 refills | Status: DC

## 2017-01-20 MED ORDER — ENALAPRIL MALEATE 20 MG PO TABS: 20.0000 mg | ORAL_TABLET | Freq: Every day | ORAL | 5 refills | Status: DC

## 2017-01-20 MED ORDER — DOXAZOSIN MESYLATE 8 MG PO TABS: ORAL_TABLET | ORAL | 5 refills | Status: DC

## 2017-01-20 MED ORDER — PANTOPRAZOLE SODIUM 40 MG PO TBEC: 40.0000 mg | DELAYED_RELEASE_TABLET | Freq: Two times a day (BID) | ORAL | 5 refills | Status: DC

## 2017-01-20 MED ORDER — VERAPAMIL HCL ER 180 MG PO TBCR: 180.0000 mg | EXTENDED_RELEASE_TABLET | Freq: Two times a day (BID) | ORAL | 5 refills | Status: DC

## 2017-01-20 MED ORDER — PRAVASTATIN SODIUM 20 MG PO TABS: 20.0000 mg | ORAL_TABLET | Freq: Every day | ORAL | 5 refills | Status: DC

## 2017-01-20 NOTE — Progress Notes (Signed)
Subjective:    Patient ID: Adam [name], male    DOB: Jul 07, 1950, 66 y.o.   MRN: [id number] Patient arrives office for wellness exam plus numerous chronic difficulties HPI  The patient comes in today for a wellness visit.    A review of their health history was completed.  A review of medications was also completed.  Any needed refills; Not sure  Eating habits: [place]/  MVA accidents in past few months: None  Regular exercise: Yes Specialist pt sees on regular basis: None  Preventative health issues were discussed.   Additional concerns: trouble w knee pain  Blood pressure medicine and blood pressure levels reviewed today with patient. Compliant with blood pressure medicine. States does not miss a dose. No obvious side effects. Blood pressure generally good when checked elsewhere. Watching salt intake.  Patient continues to take lipid medication regularly. No obvious side effects from it. Generally does not miss a dose. Prior blood work results are reviewed with patient. Patient continues to work on fat intake in diet  Right knee pain progresive, swelling at times, off and on more of a rpblem lately  Patient notes ongoing compliance with antidepressant medication. No obvious side effects. Reports does not miss a dose. Overall continues to help depression substantially. No thoughts of homicide or suicide. Would like to maintain medication. See [place] 9 result  Up to date on colonoscopy. Has not had shingles vaccine  Review of Systems  Constitutional: Negative for activity change, appetite change and fever.  HENT: Negative for congestion and rhinorrhea.   Eyes: Negative for discharge.  Respiratory: Negative for cough and wheezing.   Cardiovascular: Negative for chest pain.  Gastrointestinal: Negative for abdominal pain, blood in stool and vomiting.  Genitourinary: Negative for difficulty urinating and frequency.  Musculoskeletal: Negative for neck pain.  Skin:  Negative for rash.  Allergic/Immunologic: Negative for environmental allergies and food allergies.  Neurological: Negative for weakness and headaches.  Psychiatric/Behavioral: Negative for agitation.  All other systems reviewed and are negative.      Objective:   Physical Exam  Constitutional: He appears well-developed and well-nourished.  HENT:  Head: Normocephalic and atraumatic.  Right Ear: External ear normal.  Left Ear: External ear normal.  Nose: Nose normal.  Mouth/Throat: Oropharynx is clear and moist.  Eyes: Pupils are equal, round, and reactive to light. EOM are normal.  Neck: Normal range of motion. Neck supple. No thyromegaly present.  Cardiovascular: Normal rate, regular rhythm and normal heart sounds.   No murmur heard. Pulmonary/Chest: Effort normal and breath sounds normal. No respiratory distress. He has no wheezes.  Abdominal: Soft. Bowel sounds are normal. He exhibits no distension and no mass. There is no tenderness.  Genitourinary: Penis normal.  Musculoskeletal: Normal range of motion. He exhibits no edema.  Lymphadenopathy:    He has no cervical adenopathy.  Neurological: He is alert. He exhibits normal muscle tone.  Skin: Skin is warm and dry. No erythema.  Psychiatric: He has a normal mood and affect. His behavior is normal. Judgment normal.  Vitals reviewed.         Assessment & Plan:  Impression wellness exam. Up-to-date on colonoscopy. Diet discussed. Exercise discussed. Shingles vaccine encouraged and prescribed #2 hypertension discuss good control maintain same meds #3 hyperlipidemia prior blood work reviewed good control discussed maintain same #4. Depression long discussion held. Relatively mild at this point per patient he would prefer to stay on the Paxil. Helps appropriate blood work. Further recommendations based results.  Chronic medications refilled [facility]

## 2017-01-24 ENCOUNTER — Encounter: Payer: Self-pay | Admitting: Family Medicine

## 2017-01-24 LAB — LIPID PANEL
Chol/HDL Ratio: 2.3 ratio (ref 0.0–5.0)
Cholesterol, Total: 198 mg/dL (ref 100–199)
HDL: 87 mg/dL (ref >39)
LDL CALC: 101 mg/dL — AB (ref 0–99)
Triglycerides: 49 mg/dL (ref 0–149)
VLDL CHOLESTEROL CAL: 10 mg/dL (ref 5–40)

## 2017-01-24 LAB — HEPATIC FUNCTION PANEL
ALBUMIN: 4.4 g/dL (ref 3.6–4.8)
ALT: 19 IU/L (ref 0–44)
AST: 22 IU/L (ref 0–40)
Alkaline Phosphatase: 50 IU/L (ref 39–117)
Bilirubin Total: 1 mg/dL (ref 0.0–1.2)
Bilirubin, Direct: 0.21 mg/dL (ref 0.00–0.40)
TOTAL PROTEIN: 6.6 g/dL (ref 6.0–8.5)

## 2017-01-24 LAB — BASIC METABOLIC PANEL
BUN/Creatinine Ratio: 13 (ref 10–24)
BUN: 17 mg/dL (ref 8–27)
CALCIUM: 9.4 mg/dL (ref 8.6–10.2)
CO2: 22 mmol/L (ref 20–29)
CREATININE: 1.3 mg/dL — AB (ref 0.76–1.27)
Chloride: 103 mmol/L (ref 96–106)
GFR calc Af Amer: 65 mL/min/{1.73_m2} (ref >59)
GFR, EST NON AFRICAN AMERICAN: 56 mL/min/{1.73_m2} — AB (ref >59)
Glucose: 95 mg/dL (ref 65–99)
POTASSIUM: 4.3 mmol/L (ref 3.5–5.2)
Sodium: 143 mmol/L (ref 134–144)

## 2017-01-24 LAB — PSA: Prostate Specific Ag, Serum: 1.4 ng/mL (ref 0.0–4.0)

## 2017-02-11 ENCOUNTER — Encounter: Payer: Self-pay | Admitting: Nurse Practitioner

## 2017-02-11 ENCOUNTER — Ambulatory Visit (INDEPENDENT_AMBULATORY_CARE_PROVIDER_SITE_OTHER): Payer: BLUE CROSS/BLUE SHIELD | Admitting: Nurse Practitioner

## 2017-02-11 DIAGNOSIS — K581 Irritable bowel syndrome with constipation: Secondary | ICD-10-CM

## 2017-02-11 DIAGNOSIS — K219 Gastro-esophageal reflux disease without esophagitis: Secondary | ICD-10-CM

## 2017-02-11 NOTE — Progress Notes (Signed)
Referring Provider: [name], MD Primary Care Physician:  [name], MD Primary GI:  Dr. Oneida Alar  Chief Complaint  Patient presents with  . Medication Refill/Follow-up    HPI:   Adam [name] is a 66 y.o. male who presents for check in/follow-up office visit for medication refills. The patient was last seen in our office 04/17/2014 for GERD, IBS, dysphagia. Just prior to his visit he had an EGD with dilation and no recurrent dysphagia. EGD and colonoscopy up-to-date. He only has GERD symptoms if he eats something and appropriate. No other GI concerns. On Protonix once daily, occasional second dose at bedtime. Next due for colonoscopy in 2025.  Today he states he's doing well overall. Still on Protonix twice a day which controls his GERD symptoms for him. Only has breakthrough if he "eats the wrong thing." Denies abdominal pain, hematochezia, melena, fever, chills, unintentional weight loss, acute bowel habit changes. Does have occasional constipation. Denies chest pain, dyspnea, dizziness, lightheadedness, syncope, near syncope. Denies any other upper or lower GI symptoms.  Past Medical History:  Diagnosis Date  . Allergy   . BMI 31.0-31.9,adult JUNE 2011 180 LBS   . Chronic back pain   . Depression   . Eosinophilic granuloma ([place]) JAN 2011 GASTRIC, followed by Dr. Eugenia Pancoast   EGD JAN 2011, JUN 2011, NOV 2011  . Gastric ulcer 08/31/2012  . GERD (gastroesophageal reflux disease)   . HTN (hypertension)   . Hyperlipemia   . IBS (irritable bowel syndrome)   . Prostate hypertrophy     Past Surgical History:  Procedure Laterality Date  . BACK SURGERY    . COLONOSCOPY  JAN 2011 COMPLETE   hypoTN and bradycardia w/ Demerol ,Phenergan and Versed, [place] TICS, SML IH  . COLONOSCOPY  2003   NUR-normal  . COLONOSCOPY  06/21/09   Fields-sigmoid diverticulosis, sm int hemorrhoids  . COLONOSCOPY N/A 10/24/2013   Normal mucosa in the terminal ileum Moderate diverticulosis  noted in the sigmoid colonModerate sized internal hemorrhoids. negative random colon bx.. next TCS 10/2023  . Cyst Removed     from Left leg and knee  . ESOPHAGOGASTRODUODENOSCOPY  05/20/2012   SLF: MILD ESOPHAGITIS.NO BARRETT'S/Multiple ulcers ranging between 3-5 mm/MILD Duodenal inflammation was found in the duodenal bulb  . ESOPHAGOGASTRODUODENOSCOPY N/A 09/05/2012   SLF: MILD Non-erosive gastritis (inflammation) in the gastric antrum. Biopsies benign, no H. pylori.  . ESOPHAGOGASTRODUODENOSCOPY N/A 10/24/2013   SEG:BTDVVOHY DISTAL ESOPHAGEAL WEBSmall hiatal herniaMILD Non-erosive gastritis. SB bx benign. gastric bx, minimal inflammation.   Marland Kitchen GANGLION CYST EXCISION     from L wrist and pins placed for Fx involving L thumb  . HAND SURGERY  at the age of 50   Brother accidentally cut it  . MALONEY DILATION N/A 10/24/2013   Procedure: MALONEY DILATION;  Surgeon: Danie Binder, MD;  [facility];  Service: Endoscopy;  Laterality: N/A;  . MASS EXCISION  04/29/2012   Procedure: EXCISION MASS;  Surgeon: Donato Heinz, MD;  Location: [facility];  Service: General;  Laterality: Right;  Excision of Vascular Mass Right Arm  . SAVORY DILATION N/A 10/24/2013   Procedure: SAVORY DILATION;  Surgeon: Danie Binder, MD;  [facility];  Service: Endoscopy;  Laterality: N/A;  . SHOULDER SURGERY Left 2017  . TUMOR REMOVAL  1994   Benign [facility] brain at [facility]  . UPPER GASTROINTESTINAL ENDOSCOPY  JAN 2011   GASTRIC ULCER-EOS GRANULOMA  . UPPER GASTROINTESTINAL  ENDOSCOPY  JUN 2011   GASTRIC ULCER- EOS, NO H. PYLORI  . UPPER GASTROINTESTINAL ENDOSCOPY  NOV 2011   GASTRIC NODULE, no ulcer- EOS    Current Outpatient Prescriptions  Medication Sig Dispense Refill  . alfuzosin (UROXATRAL) 10 MG 24 hr tablet Take 10 mg by mouth daily with breakfast.    . doxazosin (CARDURA) 8 MG tablet TAKE 1 TABLET BY MOUTH EVERYDAY AT BEDTIME 30 tablet 5  . enalapril (VASOTEC) 20 MG tablet Take 1  tablet (20 mg total) by mouth daily. 30 tablet 5  . loratadine (CLARITIN) 10 MG tablet Take 10 mg by mouth daily. PRN    . pantoprazole (PROTONIX) 40 MG tablet Take 1 tablet (40 mg total) by mouth 2 (two) times daily. 60 tablet 5  . PARoxetine (PAXIL) 20 MG tablet Take 1 tablet (20 mg total) by mouth daily. 30 tablet 5  . pravastatin (PRAVACHOL) 20 MG tablet Take 1 tablet (20 mg total) by mouth at bedtime. 30 tablet 5  . verapamil (CALAN-SR) 180 MG CR tablet Take 1 tablet (180 mg total) by mouth 2 (two) times daily. 30 tablet 5   Current Facility-Administered Medications  Medication Dose Route Frequency Provider Last Rate Last Dose  . methylPREDNISolone acetate (DEPO-MEDROL) injection 40 mg  40 mg Intra-articular Once [name], MD        Allergies as of 02/11/2017 - Review Complete 02/11/2017  Allergen Reaction Noted  . Sulfonamide derivatives      Family History  Problem Relation Age of Onset  . Hypertension Mother   . Diabetes Mother   . Hyperlipidemia Mother   . Thyroid disease Mother   . Hypertension Father   . Diabetes Father   . Stroke Father   . Colon cancer Maternal Grandmother        OVER 75    Social History   Social History  . Marital status: Married    Spouse name: [name]  . Number of children: 1  . Years of education: 40   Occupational History  . disabled    Social History Main Topics  . Smoking status: Former Smoker    Packs/day: 0.25    Years: 15.00    Types: Cigars    Quit date: 04/29/2002  . Smokeless tobacco: Never Used  . Alcohol use Yes     Comment: mixed drink 1-2 per mo  . Drug use: No  . Sexual activity: Yes    Birth control/ protection: None   Other Topics Concern  . Not on file   Social History Narrative   Lives w/ wife   Caffeine use: 1 cup coffee every morning       Review of Systems: Complete ROS negative except as per HPI.   Physical Exam: There were no vitals taken for this visit. General:   Alert and  oriented. Pleasant and cooperative. Well-nourished and well-developed.  Eyes:  Without icterus, sclera clear and conjunctiva pink.  Ears:  Normal auditory acuity. Cardiovascular:  S1, S2 present without murmurs appreciated. Extremities without clubbing or edema. Respiratory:  Clear to auscultation bilaterally. No wheezes, rales, or rhonchi. No distress.  Gastrointestinal:  +BS, soft, non-tender and non-distended. No HSM noted. No guarding or rebound. No masses appreciated.  Rectal:  Deferred  Musculoskalatal:  Symmetrical without gross deformities.  Neurologic:  Alert and oriented x4;  grossly normal neurologically. Psych:  Alert and cooperative. Normal mood and affect. Heme/Lymph/Immune: No excessive bruising noted.    02/11/2017 8:32 AM   Disclaimer:  This note was dictated with voice recognition software. Similar sounding words can inadvertently be transcribed and may not be corrected upon review.

## 2017-02-11 NOTE — Assessment & Plan Note (Signed)
Symptoms generally well controlled. Has occasional constipation but not every day. Stools are generally normal. Recommend MiraLAX as needed for mild constipation. Notify us if symptoms become more persistent. Return for follow-up as needed otherwise.

## 2017-02-11 NOTE — Assessment & Plan Note (Signed)
Reflex generally well-controlled on twice a day Protonix. Only breakthrough symptoms if he eats the and appropriate setting. Recommended continue Protonix, avoid trigger foods, avoid NSAIDs. Call with any worsening symptoms. Return for follow-up as needed.

## 2017-02-11 NOTE — Patient Instructions (Signed)
1. Continue taking Protonix twice a day. 2. If you have occasional constipation you can use MiraLAX as needed. 3. Call with any questions or concerns. 4. Return for follow-up as needed.

## 2017-02-11 NOTE — Progress Notes (Signed)
cc'ed to pcp °

## 2017-02-15 ENCOUNTER — Telehealth: Payer: Self-pay | Admitting: Family Medicine

## 2017-02-15 MED ORDER — VERAPAMIL HCL ER 180 MG PO TBCR: 180.0000 mg | EXTENDED_RELEASE_TABLET | Freq: Two times a day (BID) | ORAL | 5 refills | Status: DC

## 2017-02-15 NOTE — Telephone Encounter (Signed)
Requesting Rx for Verapamil to [facility].

## 2017-03-16 ENCOUNTER — Ambulatory Visit (INDEPENDENT_AMBULATORY_CARE_PROVIDER_SITE_OTHER): Payer: BLUE CROSS/BLUE SHIELD | Admitting: Urology

## 2017-03-16 DIAGNOSIS — R351 Nocturia: Secondary | ICD-10-CM | POA: Diagnosis not present

## 2017-03-16 DIAGNOSIS — N401 Enlarged prostate with lower urinary tract symptoms: Secondary | ICD-10-CM | POA: Diagnosis not present

## 2017-04-07 ENCOUNTER — Telehealth: Payer: Self-pay | Admitting: *Deleted

## 2017-04-07 NOTE — Telephone Encounter (Signed)
Huh?

## 2017-04-08 NOTE — Telephone Encounter (Signed)
error 

## 2017-04-09 ENCOUNTER — Other Ambulatory Visit: Payer: Self-pay | Admitting: *Deleted

## 2017-04-09 MED ORDER — PAROXETINE HCL 20 MG PO TABS: 20.0000 mg | ORAL_TABLET | Freq: Every day | ORAL | 0 refills | Status: DC

## 2017-05-06 ENCOUNTER — Encounter: Payer: Self-pay | Admitting: Family Medicine

## 2017-05-06 ENCOUNTER — Ambulatory Visit (INDEPENDENT_AMBULATORY_CARE_PROVIDER_SITE_OTHER): Payer: BLUE CROSS/BLUE SHIELD | Admitting: Family Medicine

## 2017-05-06 VITALS — BP 144/90 | Ht 64.0 in | Wt 181.0 lb

## 2017-05-06 DIAGNOSIS — Z23 Encounter for immunization: Secondary | ICD-10-CM

## 2017-05-06 DIAGNOSIS — M25561 Pain in right knee: Secondary | ICD-10-CM | POA: Diagnosis not present

## 2017-05-06 MED ORDER — ETODOLAC 400 MG PO TABS: 400.0000 mg | ORAL_TABLET | Freq: Two times a day (BID) | ORAL | 1 refills | Status: DC

## 2017-05-06 NOTE — Progress Notes (Signed)
   Subjective:    Patient ID: Adam [name], male    DOB: 1950/06/10, 66 y.o.   MRN: [id number]  HPI  Patient is here today with a right knee pain with edema for a week now, does not remember doing anything to it.Has not taken anything for this.  Aching day and night worse in the morning  Worse on the floor tries to bend   Recalls no sudden injury  No otc meds   Doesn't hurt that bad, cretain movement give pain    Review of Systems No headache, no major weight loss or weight gain, no chest pain no back pain abdominal pain no change in bowel habits complete ROS otherwise negative     Objective:   Physical Exam   Right knee mild effusion noAlert vitals stable, NAD. Blood pressure good on repeat. HEENT normal. Lungs clear. Heart regular rate and rhythm.  No effusion Point tenderness no joint line tenderness     Assessment & Plan:  Impression strain right knee plan Lodine twice daily with food local measures discussed if does not improve call back

## 2017-05-25 ENCOUNTER — Other Ambulatory Visit: Payer: Self-pay | Admitting: Family Medicine

## 2017-06-06 ENCOUNTER — Other Ambulatory Visit: Payer: Self-pay | Admitting: Family Medicine

## 2017-07-04 ENCOUNTER — Other Ambulatory Visit: Payer: Self-pay | Admitting: Family Medicine

## 2017-07-08 ENCOUNTER — Other Ambulatory Visit: Payer: Self-pay | Admitting: *Deleted

## 2017-07-08 MED ORDER — ENALAPRIL MALEATE 20 MG PO TABS: 20.0000 mg | ORAL_TABLET | Freq: Every day | ORAL | 0 refills | Status: DC

## 2017-07-08 MED ORDER — PANTOPRAZOLE SODIUM 40 MG PO TBEC: 40.0000 mg | DELAYED_RELEASE_TABLET | Freq: Two times a day (BID) | ORAL | 0 refills | Status: DC

## 2017-07-23 ENCOUNTER — Ambulatory Visit: Payer: BLUE CROSS/BLUE SHIELD | Admitting: Family Medicine

## 2017-07-26 ENCOUNTER — Encounter: Payer: Self-pay | Admitting: Family Medicine

## 2017-07-26 ENCOUNTER — Ambulatory Visit (INDEPENDENT_AMBULATORY_CARE_PROVIDER_SITE_OTHER): Payer: BLUE CROSS/BLUE SHIELD | Admitting: Family Medicine

## 2017-07-26 VITALS — BP 130/80 | Ht 64.0 in | Wt 184.0 lb

## 2017-07-26 DIAGNOSIS — Z13220 Encounter for screening for lipoid disorders: Secondary | ICD-10-CM | POA: Diagnosis not present

## 2017-07-26 DIAGNOSIS — F329 Major depressive disorder, single episode, unspecified: Secondary | ICD-10-CM

## 2017-07-26 DIAGNOSIS — Z79899 Other long term (current) drug therapy: Secondary | ICD-10-CM

## 2017-07-26 DIAGNOSIS — E785 Hyperlipidemia, unspecified: Secondary | ICD-10-CM | POA: Diagnosis not present

## 2017-07-26 DIAGNOSIS — I10 Essential (primary) hypertension: Secondary | ICD-10-CM

## 2017-07-26 DIAGNOSIS — F32A Depression, unspecified: Secondary | ICD-10-CM

## 2017-07-26 MED ORDER — PAROXETINE HCL 20 MG PO TABS: 20.0000 mg | ORAL_TABLET | Freq: Every day | ORAL | 5 refills | Status: DC

## 2017-07-26 MED ORDER — PANTOPRAZOLE SODIUM 40 MG PO TBEC: 40.0000 mg | DELAYED_RELEASE_TABLET | Freq: Two times a day (BID) | ORAL | 1 refills | Status: DC

## 2017-07-26 MED ORDER — DOXAZOSIN MESYLATE 8 MG PO TABS: ORAL_TABLET | ORAL | 5 refills | Status: DC

## 2017-07-26 MED ORDER — PRAVASTATIN SODIUM 20 MG PO TABS: ORAL_TABLET | ORAL | 5 refills | Status: DC

## 2017-07-26 MED ORDER — VERAPAMIL HCL ER 180 MG PO TBCR: 180.0000 mg | EXTENDED_RELEASE_TABLET | Freq: Two times a day (BID) | ORAL | 5 refills | Status: DC

## 2017-07-26 MED ORDER — ENALAPRIL MALEATE 20 MG PO TABS: 20.0000 mg | ORAL_TABLET | Freq: Every day | ORAL | 1 refills | Status: DC

## 2017-07-26 NOTE — Progress Notes (Signed)
Subjective:    Patient ID: Adam [name], male    DOB: Aug 01, 1950, 67 y.o.   MRN: [id number]  HPI Patient is here today to follow up on HTN. He takes cardura 8 mg QHS,enalapril 20 mg daily,veramil 180 mg one Bid.  Results for orders placed or performed in visit on 01/20/17  Lipid panel  Result Value Ref Range   Cholesterol, Total 198 100 - 199 mg/dL   Triglycerides 49 0 - 149 mg/dL   HDL 87 >39 mg/dL   VLDL Cholesterol Cal 10 5 - 40 mg/dL   LDL Calculated 101 (H) 0 - 99 mg/dL   Chol/HDL Ratio 2.3 0.0 - 5.0 ratio  Hepatic function panel  Result Value Ref Range   Total Protein 6.6 6.0 - 8.5 g/dL   Albumin 4.4 3.6 - 4.8 g/dL   Bilirubin Total 1.0 0.0 - 1.2 mg/dL   Bilirubin, Direct 0.21 0.00 - 0.40 mg/dL   Alkaline Phosphatase 50 39 - 117 IU/L   AST 22 0 - 40 IU/L   ALT 19 0 - 44 IU/L  Basic metabolic panel  Result Value Ref Range   Glucose 95 65 - 99 mg/dL   BUN 17 8 - 27 mg/dL   Creatinine, Ser 1.30 (H) 0.76 - 1.27 mg/dL   GFR calc non Af Amer 56 (L) >59 mL/min/1.73   GFR calc Af Amer 65 >59 mL/min/1.73   BUN/Creatinine Ratio 13 10 - 24   Sodium 143 134 - 144 mmol/L   Potassium 4.3 3.5 - 5.2 mmol/L   Chloride 103 96 - 106 mmol/L   CO2 22 20 - 29 mmol/L   Calcium 9.4 8.6 - 10.2 mg/dL  PSA  Result Value Ref Range   Prostate Specific Ag, Serum 1.4 0.0 - 4.0 ng/mL    .htnm Blood pressure medicine and blood pressure levels reviewed today with patient. Compliant with blood pressure medicine. States does not miss a dose. No obvious side effects. Blood pressure generally good when checked elsewhere. Watching salt intake.   Patient continues to take lipid medication regularly. No obvious side effects from it. Generally does not miss a dose. Prior blood work results are reviewed with patient. Patient continues to work on fat intake in diet  Patient notes ongoing compliance with antidepressant medication. No obvious side effects. Reports does not miss a dose. Overall  continues to help depression substantially. No thoughts of homicide or suicide. Would like to maintain medication.  Numbers ovrall good, was up th other day    ususlly nubers re god     Review of Systems No headache, no major weight loss or weight gain, no chest pain no back pain abdominal pain no change in bowel habits complete ROS otherwise negative     Objective:   Physical Exam  Alert and oriented, vitals reviewed and stable, NAD ENT-TM's and ext canals WNL bilat via otoscopic exam Soft palate, tonsils and post pharynx WNL via oropharyngeal exam Neck-symmetric, no masses; thyroid nonpalpable and nontender Pulmonary-no tachypnea or accessory muscle use; Clear without wheezes via auscultation Card--no abnrml murmurs, rhythm reg and rate WNL Carotid pulses symmetric, without bruits   Impression hypertension      Assessment & Plan:  Controlled.  Discussed maintain same meds diet exercise discussed  2.  Hyperlipidemia prior blood work reviewed were discussed maintain same meds diet discussed Follow-up in 6 months for chronic concerns plus wellness.  Appropriate blood work ordered await results 3.  Depression clinically stable to maintain same intervention  symptom care discussed

## 2017-07-27 ENCOUNTER — Other Ambulatory Visit: Payer: Self-pay | Admitting: *Deleted

## 2017-07-27 LAB — LIPID PANEL
Chol/HDL Ratio: 2.5 ratio (ref 0.0–5.0)
Cholesterol, Total: 203 mg/dL — ABNORMAL HIGH (ref 100–199)
HDL: 80 mg/dL (ref >39)
LDL CALC: 104 mg/dL — AB (ref 0–99)
TRIGLYCERIDES: 93 mg/dL (ref 0–149)
VLDL Cholesterol Cal: 19 mg/dL (ref 5–40)

## 2017-07-27 LAB — HEPATIC FUNCTION PANEL
ALBUMIN: 4.9 g/dL — AB (ref 3.6–4.8)
ALK PHOS: 55 IU/L (ref 39–117)
ALT: 18 IU/L (ref 0–44)
AST: 19 IU/L (ref 0–40)
Bilirubin Total: 1.2 mg/dL (ref 0.0–1.2)
Bilirubin, Direct: 0.25 mg/dL (ref 0.00–0.40)
Total Protein: 7.2 g/dL (ref 6.0–8.5)

## 2017-07-27 MED ORDER — DOXAZOSIN MESYLATE 8 MG PO TABS: ORAL_TABLET | ORAL | 1 refills | Status: DC

## 2017-08-02 ENCOUNTER — Encounter: Payer: Self-pay | Admitting: Family Medicine

## 2017-08-16 ENCOUNTER — Encounter: Payer: Self-pay | Admitting: Family Medicine

## 2017-08-16 ENCOUNTER — Ambulatory Visit (INDEPENDENT_AMBULATORY_CARE_PROVIDER_SITE_OTHER): Payer: BLUE CROSS/BLUE SHIELD | Admitting: Family Medicine

## 2017-08-16 VITALS — BP 146/90 | Temp 98.5°F | Ht 64.0 in | Wt 184.0 lb

## 2017-08-16 DIAGNOSIS — J329 Chronic sinusitis, unspecified: Secondary | ICD-10-CM | POA: Diagnosis not present

## 2017-08-16 MED ORDER — AMOXICILLIN 500 MG PO CAPS: 500.0000 mg | ORAL_CAPSULE | Freq: Three times a day (TID) | ORAL | 0 refills | Status: DC

## 2017-08-16 NOTE — Progress Notes (Signed)
   Subjective:    Patient ID: Adam [name], male    DOB: April 24, 1951, 67 y.o.   MRN: [id number]  Sinusitis  This is a new problem. Episode onset: 5 days. Associated symptoms include congestion, coughing, headaches and a sore throat.   Abdominal pain and diarrhea. Started 2 -3 days ago.   Low back pain for about 5 days.   Started feeling bad around five d ago  No sig fever  Got a bad ough   BP went u quite a bit  Got to hurting in the low back  Stools real loose      Frontal h a   Energy o k   Appetite  Ok  Pos exposure  Pos exp to pneumonia, and to other viruses and the flu  Got a fl shot    Review of Systems  HENT: Positive for congestion and sore throat.   Respiratory: Positive for cough.   Neurological: Positive for headaches.       Objective:   Physical Exam  Alert, mild malaise. Hydration good Vitals stable. frontal/ maxillary tenderness evident positive nasal congestion. pharynx normal neck supple  lungs clear/no crackles or wheezes. heart regular in rhythm       Assessment & Plan:  Impression rhinosinusitis/ maybe even post flu, disc likely post viral, discussed with patient. plan antibiotics prescribed. Questions answered. Symptomatic care discussed. warning signs discussed. WSL

## 2017-10-02 ENCOUNTER — Other Ambulatory Visit: Payer: Self-pay | Admitting: Family Medicine

## 2017-10-29 ENCOUNTER — Ambulatory Visit (INDEPENDENT_AMBULATORY_CARE_PROVIDER_SITE_OTHER): Payer: BLUE CROSS/BLUE SHIELD | Admitting: Family Medicine

## 2017-10-29 ENCOUNTER — Encounter: Payer: Self-pay | Admitting: Family Medicine

## 2017-10-29 VITALS — BP 136/78 | Ht 64.0 in | Wt 176.8 lb

## 2017-10-29 DIAGNOSIS — Z125 Encounter for screening for malignant neoplasm of prostate: Secondary | ICD-10-CM

## 2017-10-29 DIAGNOSIS — Z79899 Other long term (current) drug therapy: Secondary | ICD-10-CM

## 2017-10-29 DIAGNOSIS — Z13220 Encounter for screening for lipoid disorders: Secondary | ICD-10-CM

## 2017-10-29 DIAGNOSIS — I10 Essential (primary) hypertension: Secondary | ICD-10-CM | POA: Diagnosis not present

## 2017-10-29 MED ORDER — CIPROFLOXACIN HCL 500 MG PO TABS
500.0000 mg | ORAL_TABLET | Freq: Two times a day (BID) | ORAL | 0 refills | Status: DC
Start: 2017-10-29 — End: 2018-09-09

## 2017-10-29 NOTE — Patient Instructions (Signed)
Prostatitis Prostatitis is swelling or inflammation of the prostate gland. The prostate is a walnut-sized gland that is involved in the production of semen. It is located below a man's bladder, in front of the rectum. There are four types of prostatitis:  Chronic nonbacterial prostatitis. This is the most common type of prostatitis. It may be associated with a viral infection or autoimmune disorder.  Acute bacterial prostatitis. This is the least common type of prostatitis. It starts quickly and is usually associated with a bladder infection, high fever, and shaking chills. It can occur at any age.  Chronic bacterial prostatitis. This type usually results from acute bacterial prostatitis that happens repeatedly (is recurrent) or has not been treated properly. It can occur in men of any age but is most common among middle-aged men whose prostate has begun to get larger. The symptoms are not as severe as symptoms caused by acute bacterial prostatitis.  Prostatodynia or chronic pelvic pain syndrome (CPPS). This type is also called pelvic floor disorder. It is associated with increased muscular tone in the pelvis surrounding the prostate. What are the causes? Bacterial prostatitis is caused by infection from bacteria. Chronic nonbacterial prostatitis may be caused by:  Urinary tract infections (UTIs).  Nerve damage.  A response by the body's disease-fighting system (autoimmune response).  Chemicals in the urine. The causes of the other types of prostatitis are usually not known. What are the signs or symptoms? Symptoms of this condition vary depending upon the type of prostatitis. If you have acute bacterial prostatitis, you may experience:  Urinary symptoms, such as:  Painful urination.  Burning during urination.  Frequent and sudden urges to urinate.  Inability to start urinating.  A weak or interrupted stream of urine.  Vomiting.  Nausea.  Fever.  Chills.  Inability to  empty the bladder completely.  Pain in the:  Muscles or joints.  Lower back.  Lower abdomen. If you have any of the other types of prostatitis, you may experience:  Urinary symptoms, such as:  Sudden urges to urinate.  Frequent urination.  Difficulty starting urination.  Weak urine stream.  Dribbling after urination.  Discharge from the urethra. The urethra is a tube that opens at the end of the penis.  Pain in the:  Testicles.  Penis or tip of the penis.  Rectum.  Area in front of the rectum and below the scrotum (perineum).  Problems with sexual function.  Painful ejaculation.  Bloody semen. How is this diagnosed? This condition may be diagnosed based on:  A physical and medical exam.  Your symptoms.  A urine test to check for bacteria.  An exam in which a health care provider uses a finger to feel the prostate (digital rectal exam).  A test of a sample of semen.  Blood tests.  Ultrasound.  Removal of prostate tissue to be examined under a microscope (biopsy).  Tests to check how your body handles urine (urodynamic tests).  A test to look inside your bladder or urethra (cystoscopy). How is this treated? Treatment for this condition depends on the type of prostatitis. Treatment may involve:  Medicines to relieve pain or inflammation.  Medicines to help relax your muscles.  Physical therapy.  Heat therapy.  Techniques to help you control certain body functions (biofeedback).  Relaxation exercises.  Antibiotic medicine, if your condition is caused by bacteria.  Warm water baths (sitz baths). Sitz baths help with relaxing your pelvic floor muscles, which helps to relieve pressure on the prostate. Follow   these instructions at home:  Take over-the-counter and prescription medicines only as told by your health care provider.  If you were prescribed an antibiotic, take it as told by your health care provider. Do not stop taking the  antibiotic even if you start to feel better.  If physical therapy, biofeedback, or relaxation exercises were prescribed, do exercises as instructed.  Take sitz baths as directed by your health care provider. For a sitz bath, sit in warm water that is deep enough to cover your hips and buttocks.  Keep all follow-up visits as told by your health care provider. This is important. Contact a health care provider if:  Your symptoms get worse.  You have a fever. Get help right away if:  You have chills.  You feel nauseous.  You vomit.  You feel light-headed or feel like you are going to faint.  You are unable to urinate.  You have blood or blood clots in your urine. This information is not intended to replace advice given to you by your health care provider. Make sure you discuss any questions you have with your health care provider. Document Released: 05/22/2000 Document Revised: 02/13/2016 Document Reviewed: 02/13/2016 Elsevier Interactive Patient Education  2017 [employer]  

## 2017-10-29 NOTE — Progress Notes (Signed)
   Subjective:    Patient ID: Adam [name], male    DOB: [birth year], 67 y.o.   MRN: [id number]  HPI  Patient arrives with lower back pain for about a week. , Significant lower back pain present for about a week does radiate into the lower abdomen denies high fever chills sweats.  Relates slight dysuria.  No hematuria.  No vomiting or diarrhea. Review of Systems  Constitutional: Negative for activity change, fatigue and fever.  HENT: Negative for congestion and rhinorrhea.   Respiratory: Negative for cough and shortness of breath.   Cardiovascular: Negative for chest pain and leg swelling.  Gastrointestinal: Negative for abdominal pain, diarrhea and nausea.  Genitourinary: Negative for dysuria and hematuria.  Neurological: Negative for weakness and headaches.  Psychiatric/Behavioral: Negative for behavioral problems.       Objective:   Physical Exam  Constitutional: He appears well-nourished.  HENT:  Head: Normocephalic and atraumatic.  Eyes: Right eye exhibits no discharge. Left eye exhibits no discharge.  Cardiovascular: Normal rate, regular rhythm and normal heart sounds.  No murmur heard. Pulmonary/Chest: Effort normal and breath sounds normal.  Abdominal: Soft. He exhibits no distension. There is tenderness. There is no guarding.  Musculoskeletal: He exhibits no edema.  Lymphadenopathy:    He has no cervical adenopathy.  Neurological: He is alert.  Psychiatric: His behavior is normal.  Vitals reviewed.   prostate is enlarged and tender minimal lower abdominal tenderness       Assessment & Plan:  Prostatitis Antibiotics for 3 weeks but if not totally gone away continue it for 30 days Continue Cardura If high fevers severe pain or worse follow-up  Patient to do lab work before his office visit in August

## 2017-12-15 ENCOUNTER — Ambulatory Visit (INDEPENDENT_AMBULATORY_CARE_PROVIDER_SITE_OTHER): Payer: BLUE CROSS/BLUE SHIELD | Admitting: Family Medicine

## 2017-12-15 ENCOUNTER — Encounter: Payer: Self-pay | Admitting: Family Medicine

## 2017-12-15 VITALS — BP 130/80 | Temp 98.7°F | Ht 64.0 in | Wt 170.0 lb

## 2017-12-15 DIAGNOSIS — R300 Dysuria: Secondary | ICD-10-CM | POA: Diagnosis not present

## 2017-12-15 DIAGNOSIS — N410 Acute prostatitis: Secondary | ICD-10-CM | POA: Diagnosis not present

## 2017-12-15 LAB — POCT URINALYSIS DIPSTICK
Protein, UA: POSITIVE — AB
Spec Grav, UA: >=1.03 — AB (ref 1.010–1.025)
pH, UA: 5 (ref 5.0–8.0)

## 2017-12-15 MED ORDER — CIPROFLOXACIN HCL 750 MG PO TABS: 750.0000 mg | ORAL_TABLET | Freq: Two times a day (BID) | ORAL | 0 refills | Status: DC

## 2017-12-15 NOTE — Progress Notes (Signed)
   Subjective:    Patient ID: Adam [name], male    DOB: July 19, 1950, 67 y.o.   MRN: [id number]  Back Pain  This is a new problem. The current episode started 1 to 4 weeks ago. Associated symptoms include abdominal pain and a fever. (Painful urination, runny nose, cough, diarrhea, hard to get stream started, urgency)   Results for orders placed or performed in visit on 12/15/17  POCT Urinalysis Dipstick  Result Value Ref Range   Color, UA     Clarity, UA     Glucose, UA  Negative   Bilirubin, UA     Ketones, UA     Spec Grav, UA >=1.030 (A) 1.010 - 1.025   Blood, UA     pH, UA 5.0 5.0 - 8.0   Protein, UA Positive (A) Negative   Urobilinogen, UA  0.2 or 1.0 E.U./dL   Nitrite, UA     Leukocytes, UA  Negative   Appearance     Odor      Pos incr freq, pow hesitancey   Diffuse low back discomfor   Diffuse lmild abd discomfort   Feeling achey and a it of fever    Review of Systems  Constitutional: Positive for fever.  Gastrointestinal: Positive for abdominal pain.  Musculoskeletal: Positive for back pain.       Objective:   Physical Exam  Alert active good hydration HEENT normal lungs clear heart negative CVA tenderness positive enlarged boggy soft tender prostate gland  Urinalysis occasional white blood cell      Assessment & Plan:  1 1 impression acute prostatitis discussed.  Second flare in recent months.  Need to hold off on blood work for now.  Rationale discussed.  Cipro 750 twice daily 21 days symptom care discussed

## 2017-12-23 IMAGING — DX DG SHOULDER 2+V*L*
3 series · 3 of 3 positions shown · non-contrast
Comparison: 02/06/2009.

CLINICAL DATA: Pain.  No prior injury .

EXAM:
LEFT SHOULDER - 2+ VIEW

[shoulder grashey]
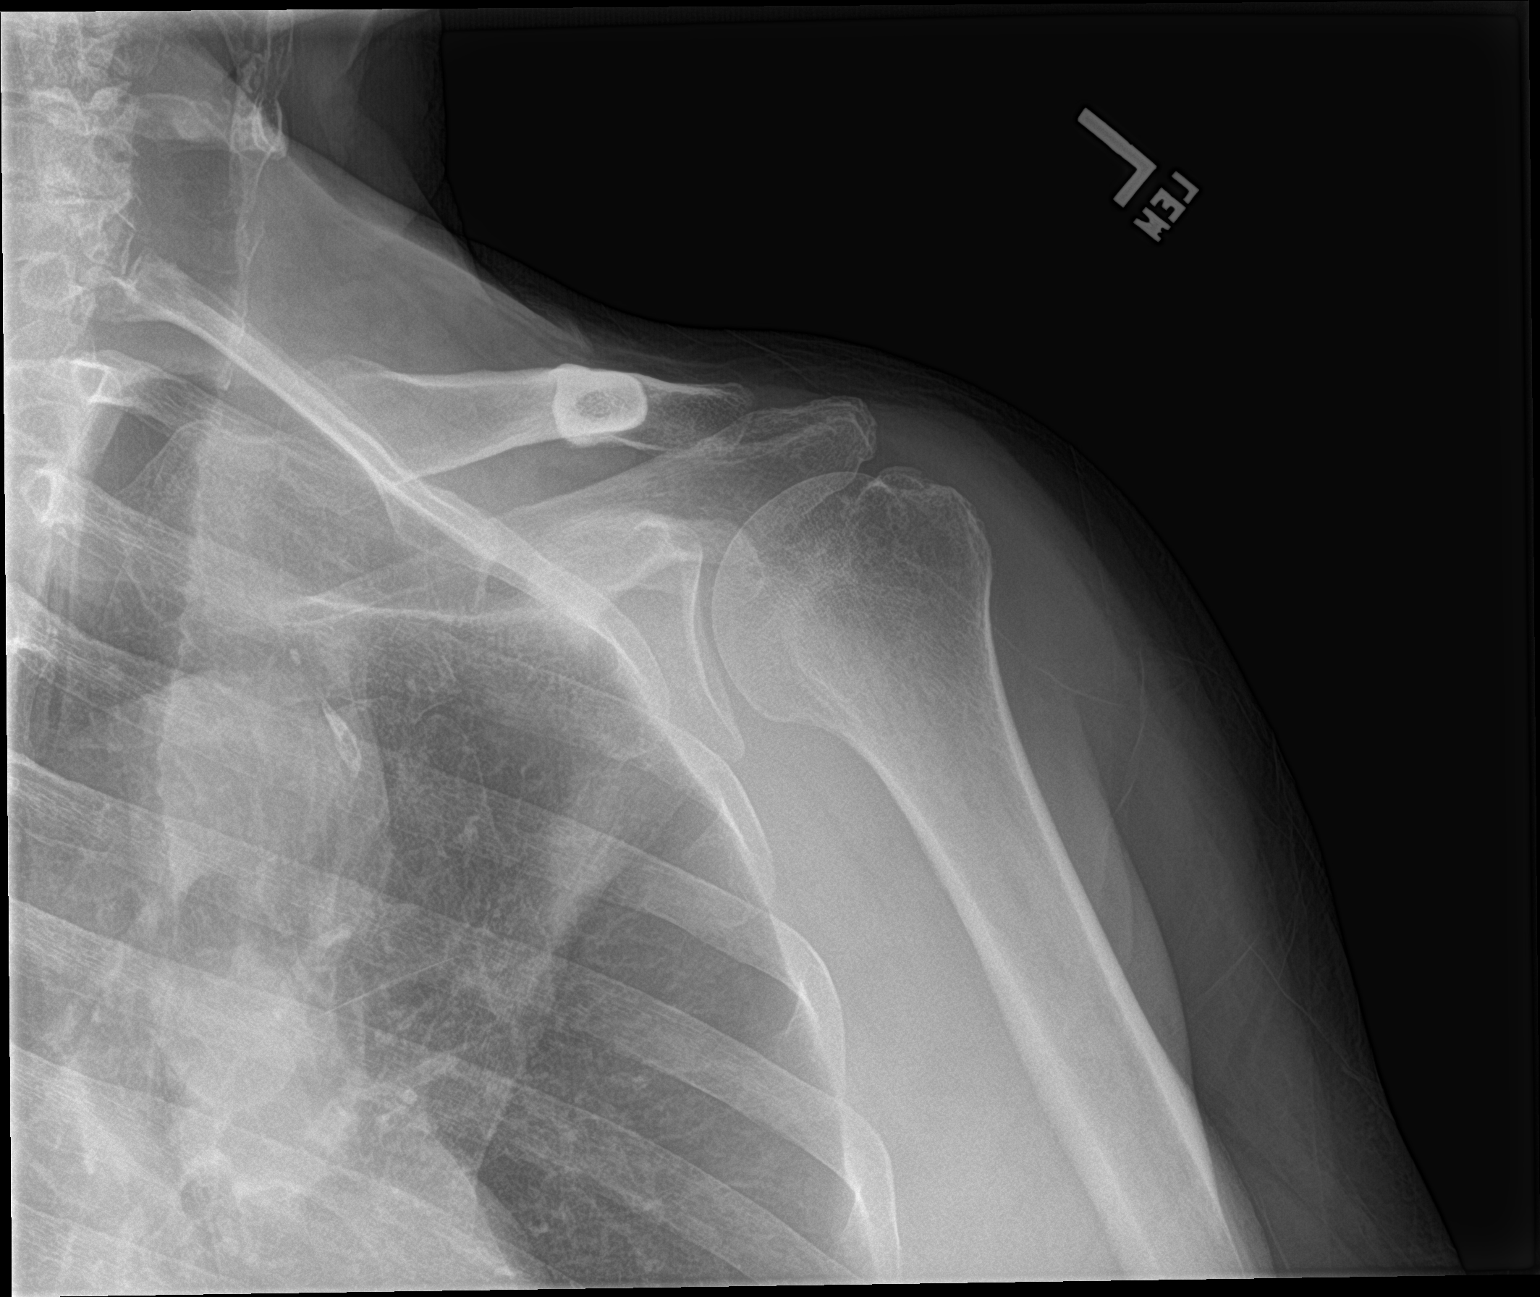

[shoulder y view]
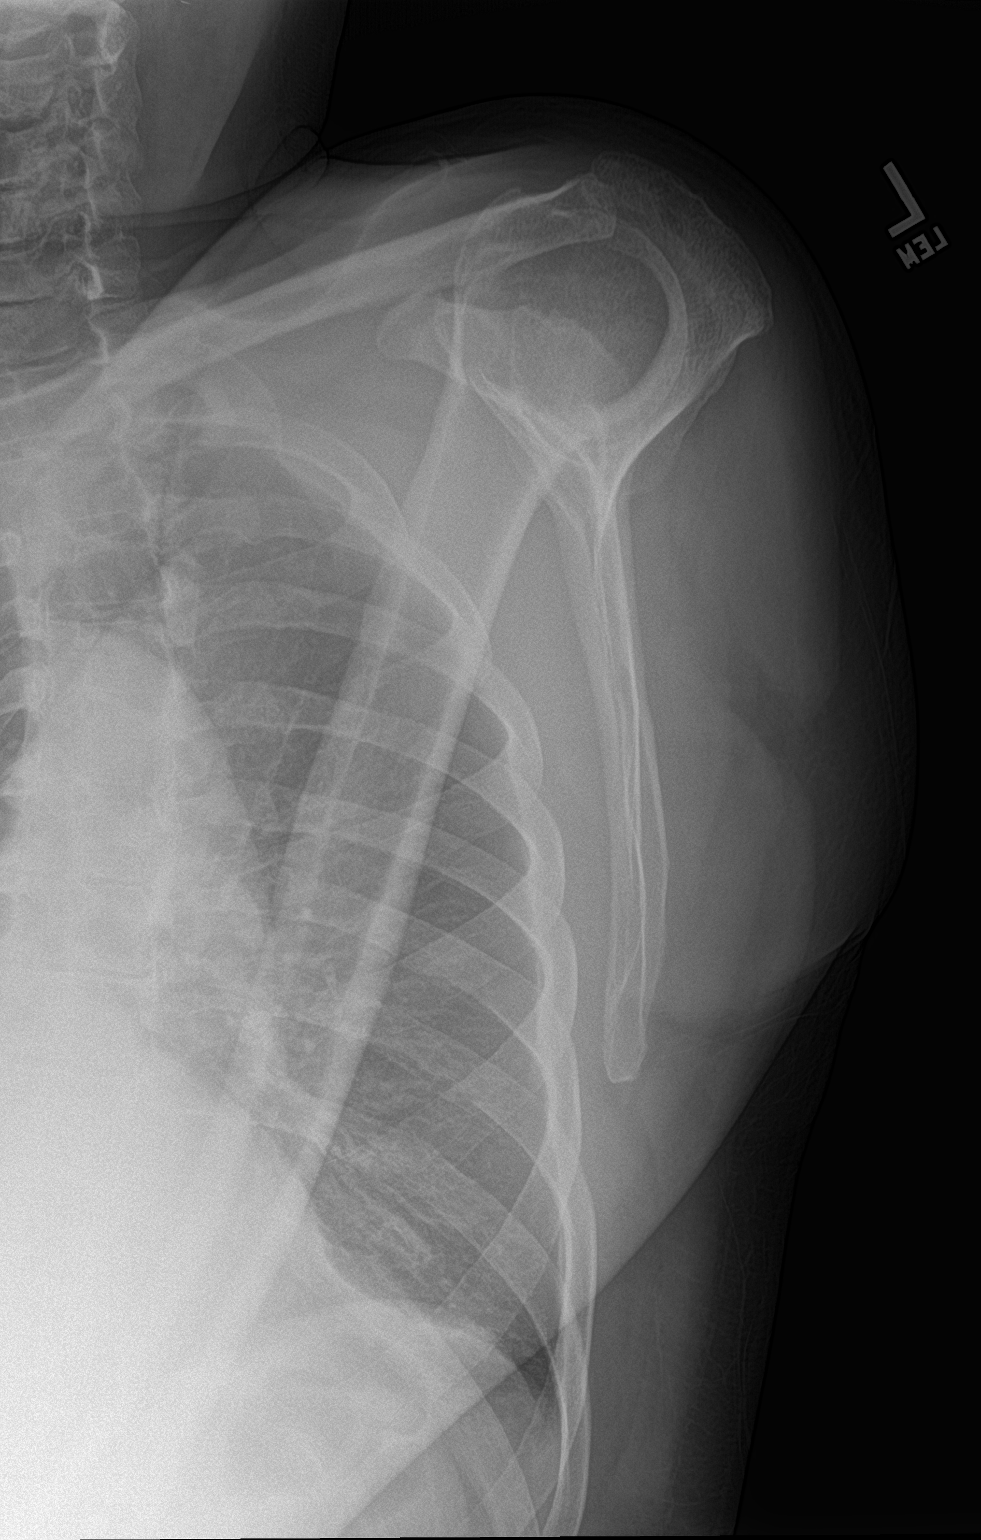

[shoulder axillary]
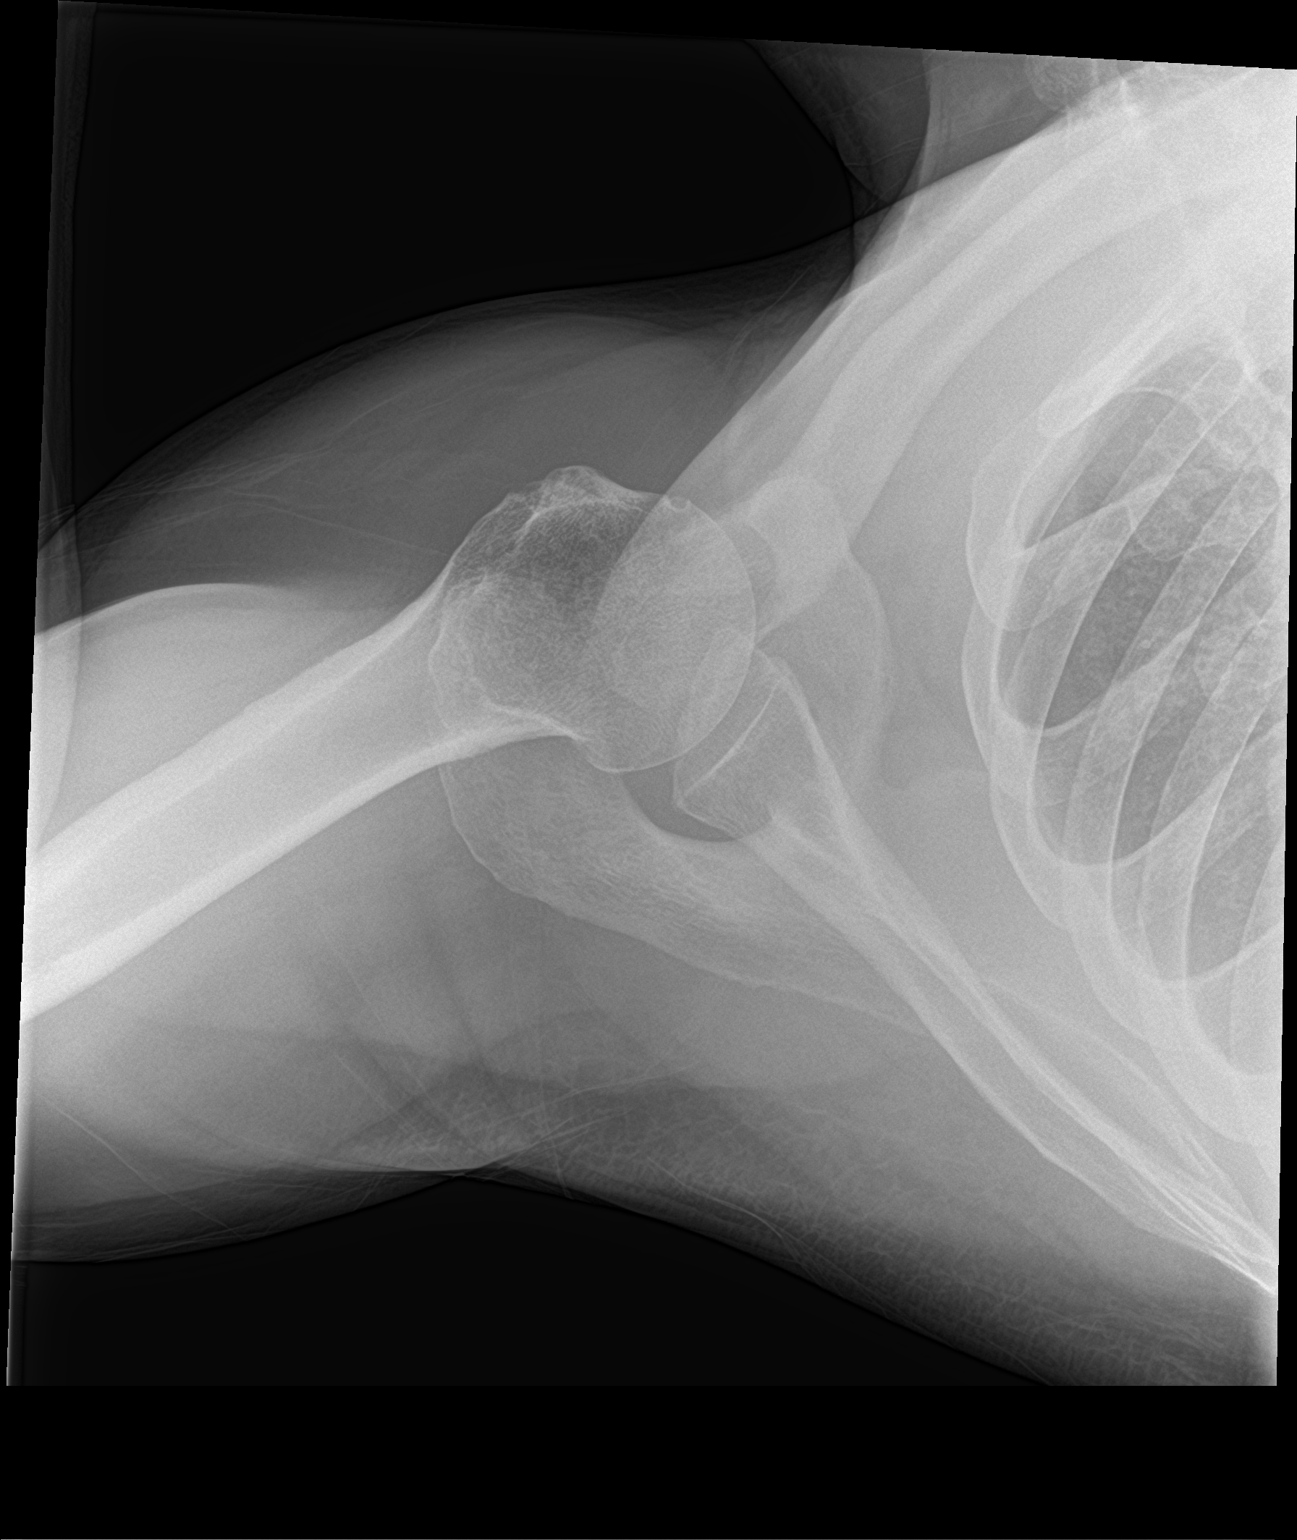

[3 of 3 positions shown; findings below may reference images not displayed]

FINDINGS: Acromioclavicular and glenohumeral degenerative change. No evidence
of fracture or dislocation. No acute abnormality identified.
IMPRESSION: Degenerative changes left shoulder.  No acute abnormality.

## 2017-12-26 ENCOUNTER — Other Ambulatory Visit: Payer: Self-pay | Admitting: Family Medicine

## 2018-01-10 ENCOUNTER — Ambulatory Visit (INDEPENDENT_AMBULATORY_CARE_PROVIDER_SITE_OTHER): Payer: BLUE CROSS/BLUE SHIELD | Admitting: Family Medicine

## 2018-01-10 ENCOUNTER — Encounter: Payer: Self-pay | Admitting: Family Medicine

## 2018-01-10 VITALS — BP 140/84 | Temp 98.6°F | Ht 64.0 in | Wt 168.0 lb

## 2018-01-10 DIAGNOSIS — M545 Low back pain: Secondary | ICD-10-CM | POA: Diagnosis not present

## 2018-01-10 DIAGNOSIS — N410 Acute prostatitis: Secondary | ICD-10-CM

## 2018-01-10 LAB — POCT URINALYSIS DIPSTICK
Blood, UA: NEGATIVE
Leukocytes, UA: NEGATIVE
Protein, UA: POSITIVE — AB
pH, UA: 5 (ref 5.0–8.0)

## 2018-01-10 MED ORDER — DOXYCYCLINE HYCLATE 100 MG PO TABS: ORAL_TABLET | ORAL | 0 refills | Status: DC

## 2018-01-10 NOTE — Progress Notes (Signed)
   Subjective:    Patient ID: Adam [name], male    DOB: 1950-08-26, 67 y.o.   MRN: [id number]  HPI Patient is here today with complaints of lower back pain. Ongoing for the last two months.Diarrhea and sweating.   Review of Systems Results for orders placed or performed in visit on 01/10/18  POCT urinalysis dipstick  Result Value Ref Range   Color, UA     Clarity, UA     Glucose, UA  Negative   Bilirubin, UA     Ketones, UA     Spec Grav, UA >=1.030 (A) 1.010 - 1.025   Blood, UA negative    pH, UA 5.0 5.0 - 8.0   Protein, UA Positive (A) Negative   Urobilinogen, UA  0.2 or 1.0 E.U./dL   Nitrite, UA     Leukocytes, UA Negative Negative   Appearance     Odor     Diffuse low back pain, sweats at times urinary hesitancy  Took all his antibiotics  Up  Feeling dizzy at times, some trouble  With lightheadedness  Took all the ax                   +                   +     Objective:   Physical Exam  Alert vitals stable, NAD. Blood pressure good on repeat. HEENT normal. Lungs clear. Heart regular rate and rhythm. Prostate exam swollen boggy tender      Assessment & Plan:  Impression persistent prostatitis.  Discussed.  Antibiotic prescribed for 30 days.

## 2018-01-16 ENCOUNTER — Other Ambulatory Visit: Payer: Self-pay | Admitting: Family Medicine

## 2018-01-21 ENCOUNTER — Other Ambulatory Visit: Payer: Self-pay | Admitting: Urology

## 2018-01-21 ENCOUNTER — Ambulatory Visit (HOSPITAL_COMMUNITY)
Admission: RE | Admit: 2018-01-21 | Discharge: 2018-01-21 | Disposition: A | Discharge status: Home / Self Care | Payer: BLUE CROSS/BLUE SHIELD | Source: Ambulatory Visit | Attending: Urology | Admitting: Urology

## 2018-01-21 ENCOUNTER — Ambulatory Visit (INDEPENDENT_AMBULATORY_CARE_PROVIDER_SITE_OTHER): Payer: BLUE CROSS/BLUE SHIELD | Admitting: Urology

## 2018-01-21 DIAGNOSIS — M545 Low back pain: Secondary | ICD-10-CM | POA: Diagnosis not present

## 2018-01-21 DIAGNOSIS — M47816 Spondylosis without myelopathy or radiculopathy, lumbar region: Secondary | ICD-10-CM | POA: Diagnosis not present

## 2018-01-21 DIAGNOSIS — M4316 Spondylolisthesis, lumbar region: Secondary | ICD-10-CM | POA: Diagnosis not present

## 2018-01-21 DIAGNOSIS — I700 Atherosclerosis of aorta: Secondary | ICD-10-CM | POA: Diagnosis not present

## 2018-01-21 DIAGNOSIS — R3912 Poor urinary stream: Secondary | ICD-10-CM

## 2018-01-21 DIAGNOSIS — M5136 Other intervertebral disc degeneration, lumbar region: Secondary | ICD-10-CM | POA: Insufficient documentation

## 2018-01-21 DIAGNOSIS — N401 Enlarged prostate with lower urinary tract symptoms: Secondary | ICD-10-CM | POA: Diagnosis not present

## 2018-01-21 DIAGNOSIS — R3915 Urgency of urination: Secondary | ICD-10-CM

## 2018-01-25 ENCOUNTER — Encounter: Payer: BLUE CROSS/BLUE SHIELD | Admitting: Family Medicine

## 2018-02-01 LAB — LIPID PANEL
CHOL/HDL RATIO: 2.8 ratio (ref 0.0–5.0)
Cholesterol, Total: 189 mg/dL (ref 100–199)
HDL: 68 mg/dL (ref >39)
LDL CALC: 113 mg/dL — AB (ref 0–99)
Triglycerides: 42 mg/dL (ref 0–149)
VLDL Cholesterol Cal: 8 mg/dL (ref 5–40)

## 2018-02-01 LAB — HEPATIC FUNCTION PANEL
ALBUMIN: 4.1 g/dL (ref 3.6–4.8)
ALT: 18 IU/L (ref 0–44)
AST: 21 IU/L (ref 0–40)
Alkaline Phosphatase: 47 IU/L (ref 39–117)
BILIRUBIN TOTAL: 0.5 mg/dL (ref 0.0–1.2)
BILIRUBIN, DIRECT: 0.15 mg/dL (ref 0.00–0.40)
TOTAL PROTEIN: 6.5 g/dL (ref 6.0–8.5)

## 2018-02-01 LAB — BASIC METABOLIC PANEL
BUN / CREAT RATIO: 12 (ref 10–24)
BUN: 15 mg/dL (ref 8–27)
CO2: 24 mmol/L (ref 20–29)
CREATININE: 1.21 mg/dL (ref 0.76–1.27)
Calcium: 9.5 mg/dL (ref 8.6–10.2)
Chloride: 103 mmol/L (ref 96–106)
GFR, EST AFRICAN AMERICAN: 71 mL/min/{1.73_m2} (ref >59)
GFR, EST NON AFRICAN AMERICAN: 61 mL/min/{1.73_m2} (ref >59)
Glucose: 93 mg/dL (ref 65–99)
POTASSIUM: 4.3 mmol/L (ref 3.5–5.2)
SODIUM: 142 mmol/L (ref 134–144)

## 2018-02-01 LAB — PSA: Prostate Specific Ag, Serum: 2 ng/mL (ref 0.0–4.0)

## 2018-02-04 ENCOUNTER — Other Ambulatory Visit: Payer: Self-pay | Admitting: Family Medicine

## 2018-02-11 ENCOUNTER — Encounter: Payer: BLUE CROSS/BLUE SHIELD | Admitting: Family Medicine

## 2018-02-16 ENCOUNTER — Encounter: Payer: Self-pay | Admitting: Family Medicine

## 2018-02-16 ENCOUNTER — Ambulatory Visit (INDEPENDENT_AMBULATORY_CARE_PROVIDER_SITE_OTHER): Payer: BLUE CROSS/BLUE SHIELD | Admitting: Family Medicine

## 2018-02-16 VITALS — BP 130/78 | Ht 64.0 in | Wt 171.0 lb

## 2018-02-16 DIAGNOSIS — E785 Hyperlipidemia, unspecified: Secondary | ICD-10-CM

## 2018-02-16 DIAGNOSIS — G8929 Other chronic pain: Secondary | ICD-10-CM

## 2018-02-16 DIAGNOSIS — F320 Major depressive disorder, single episode, mild: Secondary | ICD-10-CM

## 2018-02-16 DIAGNOSIS — F329 Major depressive disorder, single episode, unspecified: Secondary | ICD-10-CM | POA: Diagnosis not present

## 2018-02-16 DIAGNOSIS — M545 Low back pain: Secondary | ICD-10-CM | POA: Diagnosis not present

## 2018-02-16 DIAGNOSIS — Z23 Encounter for immunization: Secondary | ICD-10-CM

## 2018-02-16 DIAGNOSIS — I10 Essential (primary) hypertension: Secondary | ICD-10-CM | POA: Diagnosis not present

## 2018-02-16 DIAGNOSIS — Z0000 Encounter for general adult medical examination without abnormal findings: Secondary | ICD-10-CM | POA: Diagnosis not present

## 2018-02-16 DIAGNOSIS — F32A Depression, unspecified: Secondary | ICD-10-CM

## 2018-02-16 MED ORDER — VERAPAMIL HCL ER 180 MG PO TBCR: 180.0000 mg | EXTENDED_RELEASE_TABLET | Freq: Two times a day (BID) | ORAL | 5 refills | Status: DC

## 2018-02-16 MED ORDER — PRAVASTATIN SODIUM 20 MG PO TABS: ORAL_TABLET | ORAL | 1 refills | Status: DC

## 2018-02-16 MED ORDER — PANTOPRAZOLE SODIUM 40 MG PO TBEC: 40.0000 mg | DELAYED_RELEASE_TABLET | Freq: Two times a day (BID) | ORAL | 1 refills | Status: DC

## 2018-02-16 MED ORDER — DOXAZOSIN MESYLATE 8 MG PO TABS: ORAL_TABLET | ORAL | 1 refills | Status: DC

## 2018-02-16 MED ORDER — PAROXETINE HCL 20 MG PO TABS: 20.0000 mg | ORAL_TABLET | Freq: Every day | ORAL | 1 refills | Status: DC

## 2018-02-16 MED ORDER — ENALAPRIL MALEATE 20 MG PO TABS: 20.0000 mg | ORAL_TABLET | Freq: Every day | ORAL | 1 refills | Status: DC

## 2018-02-16 NOTE — Progress Notes (Signed)
   Subjective:    Patient ID: Adam [name], male    DOB: [birth year], 67 y.o.   MRN: [id number]  HPI  The patient comes in today for a wellness visit.    A review of their health history was completed.  A review of medications was also completed.  Any needed refills; yes  Eating habits: trying to eat good  Falls/  MVA accidents in past few months: none  Regular exercise: yes  Specialist pt sees on regular basis: Urology   Preventative health issues were discussed.   Additional concerns: follow up on blood pressure  Results for orders placed or performed in visit on 01/10/18  POCT urinalysis dipstick  Result Value Ref Range   Color, UA     Clarity, UA     Glucose, UA  Negative   Bilirubin, UA     Ketones, UA     Spec Grav, UA >=1.030 (A) 1.010 - 1.025   Blood, UA negative    pH, UA 5.0 5.0 - 8.0   Protein, UA Positive (A) Negative   Urobilinogen, UA  0.2 or 1.0 E.U./dL   Nitrite, UA     Leukocytes, UA Negative Negative   Appearance     Odor     Low back pain uncomfortsble achey, nagging in nature, first thing in morn ok, then start to aching after moving around and worse with movement,, dose no go down into the legs  Takes couple tyoenol prn, and that helps,  Blood pressure medicine and blood pressure levels reviewed today with patient. Compliant with blood pressure medicine. States does not miss a dose. No obvious side effects. Blood pressure generally good when checked elsewhere. Watching salt intake.  Patient continues to take lipid medication regularly. No obvious side effects from it. Generally does not miss a dose. Prior blood work results are reviewed with patient. Patient continues to work on fat intake in diet  Patient notes ongoing compliance with antidepressant medication. No obvious side effects. Reports does not miss a dose. Overall continues to help depression substantially. No thoughts of homicide or suicide. Would like to maintain  medication.      Review of Systems     Objective:   Physical Exam        Assessment & Plan:  Impression wellness exam.  Vaccines discussed.  Up-to-date on all except for flu shot and Prevnar.  Will administer today.  Colonoscopy results reviewed 5 years ago complete Next colon due in 2025 diet discussed.  Exercise discussed.  2.  Hypertension blood pressure excellent.  Compliant with meds salt intake discussed maintain same dose.  3.  Hyperlipidemia.  Prior blood work reviewed.  Liver fine cholesterol excellent to maintain same dose  4.  Depression long-term history.  Stable.  Patient wishes to maintain medicine will maintain  5.  Low back pain.  No red flag signs.  X-ray revealed substantial degenerative disc disease and arthritis.  Does greatly Tylenol.  Stretching exercises discussed.  Medications refilled.  Vaccines given.  Questions answered.  Follow-up in 6 months for chronic follow-up

## 2018-03-01 ENCOUNTER — Ambulatory Visit (INDEPENDENT_AMBULATORY_CARE_PROVIDER_SITE_OTHER): Payer: BLUE CROSS/BLUE SHIELD | Admitting: Urology

## 2018-03-01 DIAGNOSIS — N401 Enlarged prostate with lower urinary tract symptoms: Secondary | ICD-10-CM

## 2018-03-01 DIAGNOSIS — R3915 Urgency of urination: Secondary | ICD-10-CM

## 2018-04-15 ENCOUNTER — Other Ambulatory Visit: Payer: Self-pay

## 2018-04-15 MED ORDER — VERAPAMIL HCL ER 180 MG PO TBCR: 180.0000 mg | EXTENDED_RELEASE_TABLET | Freq: Two times a day (BID) | ORAL | 5 refills | Status: DC

## 2018-04-15 MED ORDER — PRAVASTATIN SODIUM 20 MG PO TABS: ORAL_TABLET | ORAL | 1 refills | Status: DC

## 2018-04-18 ENCOUNTER — Other Ambulatory Visit: Payer: Self-pay

## 2018-04-18 MED ORDER — PANTOPRAZOLE SODIUM 40 MG PO TBEC: 40.0000 mg | DELAYED_RELEASE_TABLET | Freq: Two times a day (BID) | ORAL | 1 refills | Status: DC

## 2018-04-18 MED ORDER — ENALAPRIL MALEATE 20 MG PO TABS: 20.0000 mg | ORAL_TABLET | Freq: Every day | ORAL | 1 refills | Status: DC

## 2018-04-18 MED ORDER — PAROXETINE HCL 20 MG PO TABS: 20.0000 mg | ORAL_TABLET | Freq: Every day | ORAL | 1 refills | Status: DC

## 2018-04-18 NOTE — Telephone Encounter (Signed)
Patient last seen 02/16/2018 Needs rx per [facility] for Paroxetine 20 mg # 90 day. Please advise.

## 2018-04-18 NOTE — Telephone Encounter (Signed)
Ok six mo worth 

## 2018-05-03 ENCOUNTER — Ambulatory Visit: Payer: BLUE CROSS/BLUE SHIELD | Admitting: Urology

## 2018-06-30 ENCOUNTER — Ambulatory Visit (INDEPENDENT_AMBULATORY_CARE_PROVIDER_SITE_OTHER): Payer: Medicare HMO | Admitting: Family Medicine

## 2018-06-30 ENCOUNTER — Encounter: Payer: Self-pay | Admitting: Family Medicine

## 2018-06-30 VITALS — BP 150/80 | Temp 98.2°F | Ht 64.0 in | Wt 181.0 lb

## 2018-06-30 DIAGNOSIS — J329 Chronic sinusitis, unspecified: Secondary | ICD-10-CM | POA: Diagnosis not present

## 2018-06-30 DIAGNOSIS — J029 Acute pharyngitis, unspecified: Secondary | ICD-10-CM | POA: Diagnosis not present

## 2018-06-30 MED ORDER — FIRST-DUKES MOUTHWASH MT SUSP: OROMUCOSAL | 0 refills | Status: DC

## 2018-06-30 MED ORDER — CEPHALEXIN 500 MG PO CAPS: ORAL_CAPSULE | ORAL | 0 refills | Status: DC

## 2018-06-30 NOTE — Progress Notes (Signed)
   Subjective:    Patient ID: Adam [name], male    DOB: 1951/01/06, 68 y.o.   MRN: [id number]  HPI  Patient is here today with complaints of sore throat,dry mouth,cough,bilateral ear pain and jaw pain,headache,fever,on going for the last two weeks.  He also states he is having a problem with swollowing pills and small items like popcorn they seem to be getting stuck on the way down, on going for the last week.   firt tartedsinus headache and fdrontal  Gums were tender and even a bit swollen   Throat still sore, when swallows painful   dertain foods seem to hang up briefly at the throat level  No true solid food dysphagia  He has not been taking any medications for this.  Review of Systems No vomiting no diarrhea no rash    Objective:   Physical Exam  Alert no distress.  HEENT TMs normal pharynx slight erythema mild tender anterior glands neck supple.  No thyromegaly.  Lungs clear heart rate and rhythm.  Impression      Assessment & Plan:  Impression status post rhinosinusitis/pharyngitis with persistent throat symptoms.  Will give trial of antibiotics.  Also 4 times daily menopause.  Hopefully this will calm down symptoms if not call us back and will work on ENT referral

## 2018-07-08 ENCOUNTER — Telehealth: Payer: Self-pay | Admitting: *Deleted

## 2018-07-08 NOTE — Telephone Encounter (Signed)
Insurance approved Verapmil ER 180 mg #180. Approval good 07/08/2018-06/08/2019

## 2018-07-08 NOTE — Telephone Encounter (Signed)
Patient is aware the medication was approved and should be able to pick up.

## 2018-07-12 ENCOUNTER — Ambulatory Visit: Payer: Medicare HMO | Admitting: Urology

## 2018-07-12 DIAGNOSIS — R972 Elevated prostate specific antigen [PSA]: Secondary | ICD-10-CM

## 2018-07-12 DIAGNOSIS — R3915 Urgency of urination: Secondary | ICD-10-CM

## 2018-07-12 DIAGNOSIS — N401 Enlarged prostate with lower urinary tract symptoms: Secondary | ICD-10-CM

## 2018-07-18 ENCOUNTER — Other Ambulatory Visit: Payer: Self-pay | Admitting: Family Medicine

## 2018-07-18 ENCOUNTER — Telehealth: Payer: Self-pay | Admitting: Family Medicine

## 2018-07-18 DIAGNOSIS — H9209 Otalgia, unspecified ear: Secondary | ICD-10-CM

## 2018-07-18 DIAGNOSIS — R42 Dizziness and giddiness: Secondary | ICD-10-CM

## 2018-07-18 NOTE — Telephone Encounter (Signed)
Please advise. Thank you

## 2018-07-18 NOTE — Telephone Encounter (Signed)
Contacted wife to inform her that we would be putting a referral in for ENT. Pt wife states that his through is giving him a lot of problems. Pt wife wanted to know if we could do this referral as urgent so pt could be seen sooner. Pt wife states she has saw Dr.Teoh and believes that patient would like him also. Can we place referral as urgent? Please advise. Thank you

## 2018-07-18 NOTE — Telephone Encounter (Signed)
Referral placed. Pt wife verbalized understanding.

## 2018-07-18 NOTE — Telephone Encounter (Signed)
sure

## 2018-07-18 NOTE — Telephone Encounter (Signed)
Per prior office note will do ent ref

## 2018-07-18 NOTE — Telephone Encounter (Signed)
Patient would like to inform Dr.Steve that medication of Diphenhyd-Hydrocort-Nystatin (FIRST-DUKES MOUTHWASH) SUSP is not helping patient, what would like to know what Dr.Steve advises further.    Wife on DPR

## 2018-07-25 ENCOUNTER — Ambulatory Visit (INDEPENDENT_AMBULATORY_CARE_PROVIDER_SITE_OTHER): Payer: Medicare HMO | Admitting: Otolaryngology

## 2018-07-25 DIAGNOSIS — R1312 Dysphagia, oropharyngeal phase: Secondary | ICD-10-CM | POA: Diagnosis not present

## 2018-07-25 DIAGNOSIS — H6123 Impacted cerumen, bilateral: Secondary | ICD-10-CM | POA: Diagnosis not present

## 2018-07-25 DIAGNOSIS — K219 Gastro-esophageal reflux disease without esophagitis: Secondary | ICD-10-CM | POA: Diagnosis not present

## 2018-07-29 ENCOUNTER — Other Ambulatory Visit: Payer: Self-pay | Admitting: Otolaryngology

## 2018-07-29 ENCOUNTER — Other Ambulatory Visit (HOSPITAL_COMMUNITY): Payer: Self-pay | Admitting: Otolaryngology

## 2018-07-29 DIAGNOSIS — R1312 Dysphagia, oropharyngeal phase: Secondary | ICD-10-CM

## 2018-08-05 ENCOUNTER — Ambulatory Visit (HOSPITAL_COMMUNITY)
Admission: RE | Admit: 2018-08-05 | Discharge: 2018-08-05 | Disposition: A | Discharge status: Home / Self Care | Payer: Medicare HMO | Source: Ambulatory Visit | Attending: Otolaryngology | Admitting: Otolaryngology

## 2018-08-05 ENCOUNTER — Other Ambulatory Visit (HOSPITAL_COMMUNITY): Payer: Self-pay | Admitting: Otolaryngology

## 2018-08-05 DIAGNOSIS — R1310 Dysphagia, unspecified: Secondary | ICD-10-CM | POA: Diagnosis not present

## 2018-08-05 DIAGNOSIS — R1312 Dysphagia, oropharyngeal phase: Secondary | ICD-10-CM | POA: Insufficient documentation

## 2018-09-06 ENCOUNTER — Other Ambulatory Visit: Payer: Self-pay | Admitting: Family Medicine

## 2018-09-07 NOTE — Telephone Encounter (Signed)
Pt transferred up front to schedule phone visit for Friday morning.

## 2018-09-07 NOTE — Telephone Encounter (Signed)
Phone visit fri morn

## 2018-09-09 ENCOUNTER — Other Ambulatory Visit: Payer: Self-pay

## 2018-09-09 ENCOUNTER — Ambulatory Visit (INDEPENDENT_AMBULATORY_CARE_PROVIDER_SITE_OTHER): Payer: Medicare HMO | Admitting: Family Medicine

## 2018-09-09 DIAGNOSIS — E785 Hyperlipidemia, unspecified: Secondary | ICD-10-CM | POA: Diagnosis not present

## 2018-09-09 DIAGNOSIS — I10 Essential (primary) hypertension: Secondary | ICD-10-CM

## 2018-09-09 DIAGNOSIS — F329 Major depressive disorder, single episode, unspecified: Secondary | ICD-10-CM

## 2018-09-09 DIAGNOSIS — F32A Depression, unspecified: Secondary | ICD-10-CM

## 2018-09-09 MED ORDER — PANTOPRAZOLE SODIUM 40 MG PO TBEC: 40.0000 mg | DELAYED_RELEASE_TABLET | Freq: Two times a day (BID) | ORAL | 1 refills | Status: DC

## 2018-09-09 MED ORDER — PAROXETINE HCL 20 MG PO TABS: 20.0000 mg | ORAL_TABLET | Freq: Every day | ORAL | 1 refills | Status: DC

## 2018-09-09 MED ORDER — DOXAZOSIN MESYLATE 8 MG PO TABS: ORAL_TABLET | ORAL | 1 refills | Status: DC

## 2018-09-09 MED ORDER — VERAPAMIL HCL ER 180 MG PO TBCR: 180.0000 mg | EXTENDED_RELEASE_TABLET | Freq: Two times a day (BID) | ORAL | 1 refills | Status: DC

## 2018-09-09 MED ORDER — ENALAPRIL MALEATE 20 MG PO TABS: 20.0000 mg | ORAL_TABLET | Freq: Every day | ORAL | 1 refills | Status: DC

## 2018-09-09 MED ORDER — PRAVASTATIN SODIUM 20 MG PO TABS: ORAL_TABLET | ORAL | 1 refills | Status: DC

## 2018-09-09 NOTE — Progress Notes (Signed)
   Subjective:    Patient ID: Adam [name], male    DOB: 16-Mar-1951, 68 y.o.   MRN: [id number] Visit done via video and audio Hyperlipidemia  This is a chronic problem. The current episode started more than 1 year ago. There are no compliance problems (takes meds every day, tries to eat healthy, exercises).    Pt states no concerns today.   Virtual Visit via Telephone Note  I connected with Adam [name] on 09/09/18 at 10:00 AM EDT by telephone and verified that I am speaking with the correct person using two identifiers.   I discussed the limitations, risks, security and privacy concerns of performing an evaluation and management service by telephone and the availability of in person appointments. I also discussed with the patient that there may be a patient responsible charge related to this service. The patient expressed understanding and agreed to proceed.  Blood pressure medicine and blood pressure levels reviewed today with patient. Compliant with blood pressure medicine. States does not miss a dose. No obvious side effects. Blood pressure generally good when checked elsewhere. Watching salt intake.   Patient continues to take lipid medication regularly. No obvious side effects from it. Generally does not miss a dose. Prior blood work results are reviewed with patient. Patient continues to work on fat intake in diet  Patient notes ongoing compliance with antidepressant medication. No obvious side effects. Reports does not miss a dose. Overall continues to help depression substantially. No thoughts of homicide or suicide. Would like to maintain medication.  bp running good  Diet fair  Pandemic not affecting depr  workin History of Present Illness:    Observations/Objective:   Assessment and Plan:   Follow Up Instructions:    I discussed the assessment and treatment plan with the patient. The patient was provided an opportunity to ask questions and all were  answered. The patient agreed with the plan and demonstrated an understanding of the instructions.   The patient was advised to call back or seek an in-person evaluation if the symptoms worsen or if the condition fails to improve as anticipated.  I provided 25  minutes of non-face-to-face time during this encounter.   Dayton Bailiff, LPN    Review of Systems     Objective:   Physical Exam        Assessment & Plan:  Impression 1 hypertension good control discussed maintain  2.  Hyperlipidemia.  Prior blood work reviewed reviewed maintain  3.  Depression.  Ongoing.  States no major difficulties at this time will maintain

## 2019-01-19 ENCOUNTER — Other Ambulatory Visit: Payer: Self-pay | Admitting: Family Medicine

## 2019-02-21 ENCOUNTER — Other Ambulatory Visit: Payer: Self-pay

## 2019-02-21 ENCOUNTER — Ambulatory Visit (INDEPENDENT_AMBULATORY_CARE_PROVIDER_SITE_OTHER): Payer: Medicare HMO | Admitting: Family Medicine

## 2019-02-21 ENCOUNTER — Encounter: Payer: Self-pay | Admitting: Family Medicine

## 2019-02-21 VITALS — BP 138/90 | Temp 97.4°F | Ht 64.0 in | Wt 167.2 lb

## 2019-02-21 DIAGNOSIS — Z79899 Other long term (current) drug therapy: Secondary | ICD-10-CM | POA: Diagnosis not present

## 2019-02-21 DIAGNOSIS — I10 Essential (primary) hypertension: Secondary | ICD-10-CM

## 2019-02-21 DIAGNOSIS — Z23 Encounter for immunization: Secondary | ICD-10-CM

## 2019-02-21 DIAGNOSIS — Z0000 Encounter for general adult medical examination without abnormal findings: Secondary | ICD-10-CM

## 2019-02-21 DIAGNOSIS — Z125 Encounter for screening for malignant neoplasm of prostate: Secondary | ICD-10-CM

## 2019-02-21 DIAGNOSIS — E785 Hyperlipidemia, unspecified: Secondary | ICD-10-CM | POA: Diagnosis not present

## 2019-02-21 MED ORDER — PAROXETINE HCL 20 MG PO TABS: 20.0000 mg | ORAL_TABLET | Freq: Every day | ORAL | 1 refills | Status: DC

## 2019-02-21 MED ORDER — VERAPAMIL HCL ER 180 MG PO TBCR: 180.0000 mg | EXTENDED_RELEASE_TABLET | Freq: Two times a day (BID) | ORAL | 1 refills | Status: DC

## 2019-02-21 MED ORDER — SHINGRIX 50 MCG/0.5ML IM SUSR: 0.5000 mL | Freq: Once | INTRAMUSCULAR | 1 refills | Status: AC

## 2019-02-21 MED ORDER — PRAVASTATIN SODIUM 20 MG PO TABS: ORAL_TABLET | ORAL | 1 refills | Status: DC

## 2019-02-21 MED ORDER — ENALAPRIL MALEATE 20 MG PO TABS: 20.0000 mg | ORAL_TABLET | Freq: Every day | ORAL | 1 refills | Status: DC

## 2019-02-21 MED ORDER — DOXAZOSIN MESYLATE 8 MG PO TABS: ORAL_TABLET | ORAL | 1 refills | Status: DC

## 2019-02-21 MED ORDER — PANTOPRAZOLE SODIUM 40 MG PO TBEC: 40.0000 mg | DELAYED_RELEASE_TABLET | Freq: Two times a day (BID) | ORAL | 1 refills | Status: DC

## 2019-02-21 MED ORDER — DICLOFENAC SODIUM 75 MG PO TBEC: DELAYED_RELEASE_TABLET | ORAL | 2 refills | Status: DC

## 2019-02-21 NOTE — Progress Notes (Signed)
Subjective:    Patient ID: Adam [name], male    DOB: 1950/07/27, 68 y.o.   MRN: [id number]  HPI AWV- Annual Wellness Visit  The patient was seen for their annual wellness visit. The patient's past medical history, surgical history, and family history were reviewed. Pertinent vaccines were reviewed ( tetanus, pneumonia, shingles, flu) The patient's medication list was reviewed and updated.  The height and weight were entered.  BMI recorded in electronic record elsewhere  Cognitive screening was completed. Outcome of Mini - Cog: pass   Falls /depression screening electronically recorded within record elsewhere  Current tobacco usage:none (All patients who use tobacco were given written and verbal information on quitting)  Recent listing of emergency department/hospitalizations over the past year were reviewed.  current specialist the patient sees on a regular basis: nonr   Medicare annual wellness visit patient questionnaire was reviewed.  A written screening schedule for the patient for the next 5-10 years was given. Appropriate discussion of followup regarding next visit was discussed.  Pt states he is having some right knee pain. Pt states that he has had trouble with knee for a while. Knee is swollen also; sometimes it does hurt patient to walk on it.   Results for orders placed or performed in visit on 01/10/18  POCT urinalysis dipstick  Result Value Ref Range   Color, UA     Clarity, UA     Glucose, UA  Negative   Bilirubin, UA     Ketones, UA     Spec Grav, UA >=1.030 (A) 1.010 - 1.025   Blood, UA negative    pH, UA 5.0 5.0 - 8.0   Protein, UA Positive (A) Negative   Urobilinogen, UA  0.2 or 1.0 E.U./dL   Nitrite, UA     Leukocytes, UA Negative Negative   Appearance     Odor     Blood pressure medicine and blood pressure levels reviewed today with patient. Compliant with blood pressure medicine. States does not miss a dose. No obvious side effects.  Blood pressure generally good when checked elsewhere. Watching salt intake.   Patient continues to take lipid medication regularly. No obvious side effects from it. Generally does not miss a dose. Prior blood work results are reviewed with patient. Patient continues to work on fat intake in diet    Review of Systems  Constitutional: Negative for activity change, appetite change and fever.  HENT: Negative for congestion and rhinorrhea.   Eyes: Negative for discharge.  Respiratory: Negative for cough and wheezing.   Cardiovascular: Negative for chest pain.  Gastrointestinal: Negative for abdominal pain, blood in stool and vomiting.  Genitourinary: Negative for difficulty urinating and frequency.  Musculoskeletal: Negative for neck pain.  Skin: Negative for rash.  Allergic/Immunologic: Negative for environmental allergies and food allergies.  Neurological: Negative for weakness and headaches.  Psychiatric/Behavioral: Negative for agitation.  All other systems reviewed and are negative.      Objective:   Physical Exam Vitals signs reviewed.  Constitutional:      Appearance: He is well-developed.  HENT:     Head: Normocephalic and atraumatic.     Right Ear: External ear normal.     Left Ear: External ear normal.     Nose: Nose normal.  Eyes:     Pupils: Pupils are equal, round, and reactive to light.  Neck:     Musculoskeletal: Normal range of motion and neck supple.     Thyroid: No thyromegaly.  Cardiovascular:  Rate and Rhythm: Normal rate and regular rhythm.     Heart sounds: Normal heart sounds. No murmur.  Pulmonary:     Effort: Pulmonary effort is normal. No respiratory distress.     Breath sounds: Normal breath sounds. No wheezing.  Abdominal:     General: Bowel sounds are normal. There is no distension.     Palpations: Abdomen is soft. There is no mass.     Tenderness: There is no abdominal tenderness.  Genitourinary:    Penis: Normal.   Musculoskeletal:  Normal range of motion.  Lymphadenopathy:     Cervical: No cervical adenopathy.  Skin:    General: Skin is warm and dry.     Findings: No erythema.  Neurological:     Mental Status: He is alert.     Motor: No abnormal muscle tone.  Psychiatric:        Behavior: Behavior normal.        Judgment: Judgment normal.           Assessment & Plan:  Impression wellness exam.  Colon done 12015 next due in firve yrs.  Diet discussed.  Exercise discussed.  2.  Hypertension good control discussed maintain same meds  3.  Hyperlipidemia.  Status uncertain.  Check blood work.  Compliance discussed  4.  History of stress and mood irritability Paxil definitely helps.  Wants to maintain  Flu shot today diet exercise discussed appropriate blood work follow-up in 6 months

## 2019-02-23 DIAGNOSIS — H52209 Unspecified astigmatism, unspecified eye: Secondary | ICD-10-CM | POA: Diagnosis not present

## 2019-02-27 DIAGNOSIS — Z125 Encounter for screening for malignant neoplasm of prostate: Secondary | ICD-10-CM | POA: Diagnosis not present

## 2019-02-27 DIAGNOSIS — Z79899 Other long term (current) drug therapy: Secondary | ICD-10-CM | POA: Diagnosis not present

## 2019-02-27 DIAGNOSIS — I10 Essential (primary) hypertension: Secondary | ICD-10-CM | POA: Diagnosis not present

## 2019-02-27 DIAGNOSIS — E785 Hyperlipidemia, unspecified: Secondary | ICD-10-CM | POA: Diagnosis not present

## 2019-02-28 LAB — HEPATIC FUNCTION PANEL
ALT: 23 IU/L (ref 0–44)
AST: 27 IU/L (ref 0–40)
Albumin: 4.6 g/dL (ref 3.8–4.8)
Alkaline Phosphatase: 60 IU/L (ref 39–117)
Bilirubin Total: 0.6 mg/dL (ref 0.0–1.2)
Bilirubin, Direct: 0.17 mg/dL (ref 0.00–0.40)
Total Protein: 6.7 g/dL (ref 6.0–8.5)

## 2019-02-28 LAB — LIPID PANEL
Chol/HDL Ratio: 2.3 ratio (ref 0.0–5.0)
Cholesterol, Total: 210 mg/dL — ABNORMAL HIGH (ref 100–199)
HDL: 90 mg/dL (ref >39)
LDL Chol Calc (NIH): 113 mg/dL — ABNORMAL HIGH (ref 0–99)
Triglycerides: 39 mg/dL (ref 0–149)
VLDL Cholesterol Cal: 7 mg/dL (ref 5–40)

## 2019-02-28 LAB — BASIC METABOLIC PANEL
BUN/Creatinine Ratio: 12 (ref 10–24)
BUN: 15 mg/dL (ref 8–27)
CO2: 24 mmol/L (ref 20–29)
Calcium: 10.1 mg/dL (ref 8.6–10.2)
Chloride: 101 mmol/L (ref 96–106)
Creatinine, Ser: 1.26 mg/dL (ref 0.76–1.27)
GFR calc Af Amer: 66 mL/min/{1.73_m2} (ref >59)
GFR calc non Af Amer: 57 mL/min/{1.73_m2} — ABNORMAL LOW (ref >59)
Glucose: 95 mg/dL (ref 65–99)
Potassium: 4.6 mmol/L (ref 3.5–5.2)
Sodium: 140 mmol/L (ref 134–144)

## 2019-02-28 LAB — PSA: Prostate Specific Ag, Serum: 1.8 ng/mL (ref 0.0–4.0)

## 2019-03-05 ENCOUNTER — Encounter: Payer: Self-pay | Admitting: Family Medicine

## 2019-03-30 ENCOUNTER — Other Ambulatory Visit: Payer: Self-pay | Admitting: Family Medicine

## 2019-04-07 ENCOUNTER — Other Ambulatory Visit: Payer: Self-pay

## 2019-04-07 DIAGNOSIS — Z20822 Contact with and (suspected) exposure to covid-19: Secondary | ICD-10-CM

## 2019-04-07 DIAGNOSIS — Z20828 Contact with and (suspected) exposure to other viral communicable diseases: Secondary | ICD-10-CM | POA: Diagnosis not present

## 2019-04-09 ENCOUNTER — Telehealth: Payer: Self-pay

## 2019-04-09 NOTE — Telephone Encounter (Signed)
Patient's wife, [name], called in requesting [place] lab results- obtained verbal consent - DOB/Address verified - results pending, reviewed testing process with spouse, refused MyChart set up, no further questions.

## 2019-04-10 ENCOUNTER — Telehealth: Payer: Self-pay | Admitting: *Deleted

## 2019-04-10 ENCOUNTER — Telehealth: Payer: Self-pay | Admitting: Family Medicine

## 2019-04-10 LAB — NOVEL CORONAVIRUS, NAA: SARS-CoV-2, NAA: NOT DETECTED

## 2019-04-10 NOTE — Telephone Encounter (Signed)
good

## 2019-04-10 NOTE — Telephone Encounter (Signed)
Please advise. Thank you

## 2019-04-10 NOTE — Telephone Encounter (Signed)
Pt's wife called to notify Dr.Steve that patient had his Covid test Friday & it was negative

## 2019-04-10 NOTE — Telephone Encounter (Signed)
See Result note 

## 2019-04-15 ENCOUNTER — Encounter (HOSPITAL_COMMUNITY): Payer: Self-pay | Admitting: Emergency Medicine

## 2019-04-15 ENCOUNTER — Other Ambulatory Visit: Payer: Self-pay

## 2019-04-15 ENCOUNTER — Emergency Department (HOSPITAL_COMMUNITY)
Admission: EM | Admit: 2019-04-15 | Discharge: 2019-04-16 | Disposition: A | Discharge status: Home / Self Care | Payer: Medicare HMO | Attending: Emergency Medicine | Admitting: Emergency Medicine

## 2019-04-15 DIAGNOSIS — I10 Essential (primary) hypertension: Secondary | ICD-10-CM | POA: Insufficient documentation

## 2019-04-15 DIAGNOSIS — Z87891 Personal history of nicotine dependence: Secondary | ICD-10-CM | POA: Insufficient documentation

## 2019-04-15 DIAGNOSIS — Y9389 Activity, other specified: Secondary | ICD-10-CM | POA: Insufficient documentation

## 2019-04-15 DIAGNOSIS — Y929 Unspecified place or not applicable: Secondary | ICD-10-CM | POA: Insufficient documentation

## 2019-04-15 DIAGNOSIS — X58XXXA Exposure to other specified factors, initial encounter: Secondary | ICD-10-CM | POA: Diagnosis not present

## 2019-04-15 DIAGNOSIS — T1592XA Foreign body on external eye, part unspecified, left eye, initial encounter: Secondary | ICD-10-CM

## 2019-04-15 DIAGNOSIS — Y999 Unspecified external cause status: Secondary | ICD-10-CM | POA: Diagnosis not present

## 2019-04-15 DIAGNOSIS — Z79899 Other long term (current) drug therapy: Secondary | ICD-10-CM | POA: Diagnosis not present

## 2019-04-15 DIAGNOSIS — T1502XA Foreign body in cornea, left eye, initial encounter: Secondary | ICD-10-CM | POA: Insufficient documentation

## 2019-04-15 MED ORDER — ERYTHROMYCIN 5 MG/GM OP OINT: TOPICAL_OINTMENT | OPHTHALMIC | 0 refills | Status: DC

## 2019-04-15 MED ORDER — ERYTHROMYCIN 5 MG/GM OP OINT
TOPICAL_OINTMENT | Freq: Once | OPHTHALMIC | Status: AC
  Administered 2019-04-16: via OPHTHALMIC
  Filled 2019-04-15: qty 3.5

## 2019-04-15 MED ORDER — TETRACAINE HCL 0.5 % OP SOLN
2.0000 [drp] | Freq: Once | OPHTHALMIC | Status: AC
  Administered 2019-04-16: 2 [drp] via OPHTHALMIC
  Filled 2019-04-15: qty 4

## 2019-04-15 MED ORDER — HYDROCODONE-ACETAMINOPHEN 5-325 MG PO TABS
2.0000 | ORAL_TABLET | Freq: Once | ORAL | Status: AC
  Administered 2019-04-16: 2 via ORAL
  Filled 2019-04-15: qty 2

## 2019-04-15 MED ORDER — FLUORESCEIN SODIUM 1 MG OP STRP
1.0000 | ORAL_STRIP | Freq: Once | OPHTHALMIC | Status: DC
  Filled 2019-04-15: qty 1

## 2019-04-15 NOTE — ED Triage Notes (Signed)
Pt C/o left eye pain. Pt states he was hammering metal with no safety glasses on tonight. Pt stating "it feels like something is scratching my eye."

## 2019-04-15 NOTE — ED Provider Notes (Signed)
[facility] EMERGENCY DEPARTMENT Provider Note   CSN: [id number] Arrival date & time: 04/15/19  2111     History   Chief Complaint Chief Complaint  Patient presents with   Eye Pain    HPI Adam [name] is a 68 y.o. male presents today for foreign body sensation of the left eye.  Patient reports that he was using a hammer to shave a metal flowerpot for his wife today and not using safety glasses.  He reports that he felt a piece of metal fly and hit him in the left eye, he reports this happened shortly after noon.  Patient reports he has had continued foreign body sensation since that time he describes a mild scratchy sensation of the left eye constant worsened with palpation and without alleviating factors, non-radiating.  Patient denies vision changes, drainage, erythema, swelling, headache, nausea/vomiting or any other injury or concern today.  Of note patient denies contact lens use.     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 Eugenia Pancoast   EGD JAN 2011, JUN 2011, NOV 2011   Gastric ulcer 08/31/2012   GERD (gastroesophageal reflux disease)    HTN (hypertension)    Hyperlipemia    IBS (irritable bowel syndrome)    Prostate hypertrophy     Patient Active Problem List   Diagnosis Date Noted   Depression 03/10/2016   Right arm pain 10/31/2014   Overuse injury 10/31/2014   Cervical disc disorder with radiculopathy of cervical region 10/31/2014   IBS (irritable bowel syndrome) 10/19/2013   Back pain 01/02/2013   Hyperlipidemia LDL goal <130 09/20/2012   Essential hypertension, benign 09/20/2012   GERD (gastroesophageal reflux disease) 12/30/2011   GRANULOMA 08/27/2009   Dysphagia 06/17/2009   PROSTATITIS, HX OF 06/17/2009    Past Surgical History:  Procedure Laterality Date   BACK  SURGERY     COLONOSCOPY  JAN 2011 COMPLETE   hypoTN and bradycardia w/ Demerol ,Phenergan and Versed,  TICS, SML IH   COLONOSCOPY  2003   NUR-normal   COLONOSCOPY  06/21/09   Fields-sigmoid diverticulosis, sm int hemorrhoids   COLONOSCOPY N/A 10/24/2013   Normal mucosa in the terminal ileum Moderate diverticulosis noted in the sigmoid colonModerate sized internal hemorrhoids. negative random colon bx.. next TCS 10/2023   Cyst Removed     from Left leg and knee   ESOPHAGOGASTRODUODENOSCOPY  05/20/2012   SLF: MILD ESOPHAGITIS.NO BARRETT'S/Multiple ulcers ranging between 3-5 mm/MILD Duodenal inflammation was found in the duodenal bulb   ESOPHAGOGASTRODUODENOSCOPY N/A 09/05/2012   SLF: MILD Non-erosive gastritis (inflammation) in the gastric antrum. Biopsies benign, no H. pylori.   ESOPHAGOGASTRODUODENOSCOPY N/A 10/24/2013   [id number] DISTAL ESOPHAGEAL WEBSmall hiatal herniaMILD Non-erosive gastritis. SB bx benign. gastric bx, minimal inflammation.    GANGLION CYST EXCISION     from L wrist and pins placed for Fx involving L thumb   HAND SURGERY  at the age of 65   Brother accidentally cut it   MALONEY DILATION N/A 10/24/2013   Procedure: Venia Minks DILATION;  Surgeon: Danie Binder, MD;  Location: [facility];  Service: Endoscopy;  Laterality: N/A;   MASS EXCISION  04/29/2012   Procedure: EXCISION MASS;  Surgeon: Donato Heinz, MD;  Location: [facility] ORS;  Service: General;  Laterality: Right;  Excision of Vascular  Mass Right Arm   SAVORY DILATION N/A 10/24/2013   Procedure: SAVORY DILATION;  Surgeon: Danie Binder, MD;  Location: [facility];  Service: Endoscopy;  Laterality: N/A;   SHOULDER SURGERY Left 2017   TUMOR REMOVAL  1994   Benign from brain at [place]   GASTRIC ULCER-EOS GRANULOMA   UPPER GASTROINTESTINAL ENDOSCOPY  JUN 2011   GASTRIC ULCER- EOS, NO H. PYLORI   UPPER GASTROINTESTINAL ENDOSCOPY  NOV 2011    GASTRIC NODULE, no ulcer- EOS        Home Medications    Prior to Admission medications   Medication Sig Start Date End Date Taking? Authorizing Provider  alfuzosin (UROXATRAL) 10 MG 24 hr tablet Take 10 mg by mouth daily with breakfast.    [provider]  diclofenac (VOLTAREN) 75 MG EC tablet TAKE ONE TABLET BY MOUTH TWICE DAILY WITH FOOD AS NEEDED PAIN 03/30/19   [name], MD  doxazosin (CARDURA) 8 MG tablet TAKE 1 TABLET BY MOUTH EVERYDAY AT BEDTIME 02/21/19   [name], MD  enalapril (VASOTEC) 20 MG tablet Take 1 tablet (20 mg total) by mouth daily. 02/21/19   [name], MD  loratadine (CLARITIN) 10 MG tablet Take 10 mg by mouth daily. PRN    [provider]  pantoprazole (PROTONIX) 40 MG tablet Take 1 tablet (40 mg total) by mouth 2 (two) times daily. 02/21/19   [name], MD  PARoxetine (PAXIL) 20 MG tablet Take 1 tablet (20 mg total) by mouth daily. 02/21/19   [name], MD  pravastatin (PRAVACHOL) 20 MG tablet TAKE ONE TABLET DAILY AT BEDTIME 02/21/19   [name], MD  verapamil (CALAN-SR) 180 MG CR tablet Take 1 tablet (180 mg total) by mouth 2 (two) times daily. 02/21/19   [name], MD    Family History Family History  Problem Relation Age of Onset   Hypertension Mother    Diabetes Mother    Hyperlipidemia Mother    Thyroid disease Mother    Hypertension Father    Diabetes Father    Stroke Father    Colon cancer Maternal Grandmother        OVER 35    Social History Social History   Tobacco Use   Smoking status: Former Smoker    Packs/day: 0.25    Years: 15.00    Pack years: 3.75    Types: Cigars    Quit date: 04/29/2002    Years since quitting: 16.9   Smokeless tobacco: Never Used  Substance Use Topics   Alcohol use: Yes    Comment: mixed drink 1-2 per mo   Drug use: No     Allergies   Sulfonamide derivatives   Review of Systems Review of Systems Ten systems are  reviewed and are negative for acute change except as noted in the HPI   Physical Exam Updated Vital Signs BP (!) 156/84 (BP Location: Right Arm)    Pulse (!) 55    Temp 98.4 F (36.9 C) (Oral)    Resp 16    SpO2 98%   Physical Exam Constitutional:      General: He is not in acute distress.    Appearance: Normal appearance. He is well-developed. He is not ill-appearing or diaphoretic.  HENT:     Head: Normocephalic and atraumatic.     Jaw: There is normal jaw occlusion.     Right  Ear: External ear normal.     Left Ear: External ear normal.     Nose: Nose normal. No rhinorrhea.     Right Nostril: No epistaxis.     Left Nostril: No epistaxis.  Eyes:     General: Vision grossly intact. Gaze aligned appropriately.     Extraocular Movements: Extraocular movements intact.     Conjunctiva/sclera: Conjunctivae normal.     Pupils: Pupils are equal, round, and reactive to light.     Comments: Left Eye: Appears grossly normal however on close examination a metallic foreign body is present in the center of the cornea.  No erythema or scleral icterus.  Conjunctive are clear.  Pupils equal round reactive to light and accommodating.  Extraocular motion intact without pain.  No photophobia or consensual photophobia.  No drainage.  Corneal Abrasion Exam Verbal Consent Obtained. Risks, benefits and alternatives explained. 2 drops of tetracaine (PONTOCAINE) 0.5 % ophthalmic solution were applied to the eye. Fluorescein 1 MG ophthalmic strip applied the the surface of the eye Wood's lamp used to screen for abrasion.  Aside from where the small foreign body is no fluorescein uptake.  No corneal ulcer. Negative Seidel sign. No visible hyphema or hypopyon. Eye flushed with sterile saline. Patient tolerated the procedure well   Neck:     Musculoskeletal: Normal range of motion.     Trachea: Trachea and phonation normal. No tracheal deviation.  Pulmonary:     Effort: Pulmonary effort is normal. No  respiratory distress.  Abdominal:     General: There is no distension.     Palpations: Abdomen is soft.     Tenderness: There is no abdominal tenderness. There is no guarding or rebound.  Musculoskeletal: Normal range of motion.  Skin:    General: Skin is warm and dry.  Neurological:     Mental Status: He is alert.     GCS: GCS eye subscore is 4. GCS verbal subscore is 5. GCS motor subscore is 6.     Comments: Speech is clear and goal oriented, follows commands Major Cranial nerves without deficit, no facial droop Moves extremities without ataxia, coordination intact  Psychiatric:        Behavior: Behavior normal.      ED Treatments / Results  Labs (all labs ordered are listed, but only abnormal results are displayed) Labs Reviewed - No data to display  EKG None  Radiology No results found.  Procedures .Foreign Body Removal  Date/Time: 04/15/2019 11:17 PM Performed by: Deliah Boston, PA-C Authorized by: Deliah Boston, PA-C  Consent: Verbal consent obtained. Risks and benefits: risks, benefits and alternatives were discussed Consent given by: patient Patient understanding: patient states understanding of the procedure being performed Patient consent: the patient's understanding of the procedure matches consent given Procedure consent: procedure consent matches procedure scheduled Relevant documents: relevant documents present and verified Required items: required blood products, implants, devices, and special equipment available Patient identity confirmed: verbally with patient and arm band Time out: Immediately prior to procedure a "time out" was called to verify the correct patient, procedure, equipment, support staff and site/side marked as required. Body area: eye Location details: left cornea  Sedation: Patient sedated: no  Patient restrained: no Patient cooperative: yes Localization method: visualized and eyelid eversion Removal mechanism:  irrigation and moist cotton swab Eye examined with fluorescein. Post-procedure assessment: foreign body not removed Patient tolerance: patient tolerated the procedure well with no immediate complications Comments: Unable to remove small metallic foreign body.   (  including critical care time)  Medications Ordered in ED Medications  tetracaine (PONTOCAINE) 0.5 % ophthalmic solution 2 drop (has no administration in time range)  fluorescein ophthalmic strip 1 strip (has no administration in time range)  erythromycin ophthalmic ointment (has no administration in time range)  HYDROcodone-acetaminophen (NORCO/VICODIN) 5-325 MG per tablet 2 tablet (has no administration in time range)     Initial Impression / Assessment and Plan / ED Course  I have reviewed the triage vital signs and the nursing notes.  Pertinent labs & imaging results that were available during my care of the patient were reviewed by me and considered in my medical decision making (see chart for details).  Clinical Course as of Apr 15 2323  Sat Apr 15, 2019  2249 Consult to ophthalmology   [BM]  2259 Dr. Eulas Post; erythromycin ointment; 10a   [BM]    Clinical Course User Index [BM] Deliah Boston, PA-C     68 year old male with a superficial metallic foreign body of the left cornea that happened when he was banging metal today around noon while fixing a flower pot for his wife.  He denies any visual changes, no pain with extraocular motion, no photophobia.  There is no evidence of hyphema, hypopyon, no Seidel sign, no other foreign body seen, no corneal ulcer. Eyelid clear.  He is not a contact lens user.  There is no evidence of globe rupture, ulcer, hematoma or other acute pathologies at this time.  Unable to remove foreign body today with cotton swab or irrigation, consult was called to ophthalmologist Dr. Eulas Post who advised placing patient on erythromycin ointment and for patient to go to his office tomorrow morning at  10 AM for treatment.  Patient given 2 Norco here for his acute pain due to foreign body.  Patient's wife is to drive home today, I discussed narcotic precautions with both patient and his wife and they state understanding.  At this time there does not appear to be any evidence of an acute emergency medical condition and the patient appears stable for discharge with appropriate outpatient follow up. Diagnosis was discussed with patient who verbalizes understanding of care plan and is agreeable to discharge. I have discussed return precautions with patient and wife who verbalizes understanding of return precautions. Patient encouraged to follow-up with their PCP and ophthalmology tomorrow. All questions answered.  Patient's case discussed with Dr. Rogene Houston who agrees with plan to discharge with follow-up.   Note: Portions of this report may have been transcribed using voice recognition software. Every effort was made to ensure accuracy; however, inadvertent computerized transcription errors may still be present. Final Clinical Impressions(s) / ED Diagnoses   Final diagnoses:  Foreign body of left eye, initial encounter    ED Discharge Orders    None       Gari Crown 04/15/19 2346    Fredia Sorrow, MD 04/19/19 1826

## 2019-04-15 NOTE — Discharge Instructions (Signed)
You have been diagnosed today with foreign body of the left eye.  At this time there does not appear to be the presence of an emergent medical condition, however there is always the potential for conditions to change. Please read and follow the below instructions.  Please return to the Emergency Department immediately for any new or worsening symptoms. Please be sure to follow up with your Primary Care Provider within one week regarding your visit today; please call their office to schedule an appointment even if you are feeling better for a follow-up visit. The ophthalmologist Dr. Eulas Post is expecting to see you in his office tomorrow morning at 10 AM.  Please show up 15 minutes before your appointment.  His office is located at the [facility] office. Please continue to use antibiotic ointment erythromycin every 4 hours as prescribed to avoid infection. You have been given pain medication today called Norco, this medication may make you drowsy.  Do not drive or perform any potentially dangerous activities for the rest of the day.  Do not drink any alcohol or take other sedating medications as these may worsen side effects.  Get help right away if: Your ability to see (vision) gets worse. You have more redness and swelling in or around your eye. You have any new/concerning or worsening of symptoms.  Please read the additional information packets attached to your discharge summary.  Do not take your medicine if  develop an itchy rash, swelling in your mouth or lips, or difficulty breathing; call 911 and seek immediate emergency medical attention if this occurs.  Note: Portions of this text may have been transcribed using voice recognition software. Every effort was made to ensure accuracy; however, inadvertent computerized transcription errors may still be present.

## 2019-04-16 DIAGNOSIS — D4981 Neoplasm of unspecified behavior of retina and choroid: Secondary | ICD-10-CM | POA: Diagnosis not present

## 2019-04-16 DIAGNOSIS — H2513 Age-related nuclear cataract, bilateral: Secondary | ICD-10-CM | POA: Diagnosis not present

## 2019-04-16 DIAGNOSIS — T1502XA Foreign body in cornea, left eye, initial encounter: Secondary | ICD-10-CM | POA: Diagnosis not present

## 2019-04-18 DIAGNOSIS — T1502XD Foreign body in cornea, left eye, subsequent encounter: Secondary | ICD-10-CM | POA: Diagnosis not present

## 2019-04-18 DIAGNOSIS — H2513 Age-related nuclear cataract, bilateral: Secondary | ICD-10-CM | POA: Diagnosis not present

## 2019-05-19 ENCOUNTER — Other Ambulatory Visit: Payer: Self-pay

## 2019-05-19 DIAGNOSIS — Z20822 Contact with and (suspected) exposure to covid-19: Secondary | ICD-10-CM

## 2019-05-20 LAB — NOVEL CORONAVIRUS, NAA: SARS-CoV-2, NAA: NOT DETECTED

## 2019-07-13 ENCOUNTER — Encounter: Payer: Self-pay | Admitting: Family Medicine

## 2019-07-16 ENCOUNTER — Ambulatory Visit: Payer: Medicare HMO | Attending: Internal Medicine

## 2019-07-16 ENCOUNTER — Other Ambulatory Visit: Payer: Self-pay

## 2019-07-16 DIAGNOSIS — Z23 Encounter for immunization: Secondary | ICD-10-CM

## 2019-07-16 NOTE — Progress Notes (Signed)
   Covid-19 Vaccination Clinic  Name:  Adam [name]    MRN: [id number] DOB: 1951/04/26  07/16/2019  [name] was observed post Covid-19 immunization for 15 minutes without incidence. He was provided with Vaccine Information Sheet and instruction to access the V-Safe system.   [name] was instructed to call 911 with any severe reactions post vaccine: Marland Kitchen Difficulty breathing  . Swelling of your face and throat  . A fast heartbeat  . A bad rash all over your body  . Dizziness and weakness    Immunizations Administered    Name Date Dose VIS Date Route   Moderna COVID-19 Vaccine 07/16/2019  3:04 PM 0.5 mL 05/09/2019 Intramuscular   Manufacturer: Moderna   Lot: [id number]   [id number]

## 2019-07-29 ENCOUNTER — Other Ambulatory Visit: Payer: Self-pay | Admitting: Family Medicine

## 2019-08-16 ENCOUNTER — Ambulatory Visit: Payer: Medicare HMO | Attending: Internal Medicine

## 2019-08-16 DIAGNOSIS — Z23 Encounter for immunization: Secondary | ICD-10-CM

## 2019-08-16 NOTE — Progress Notes (Signed)
   Covid-19 Vaccination Clinic  Name:  SEKAI BIGBIE    MRN: [id number] DOB: [birth year]  08/16/2019  Mr. Varney was observed post Covid-19 immunization for 15 minutes without incident. He was provided with Vaccine Information Sheet and instruction to access the V-Safe system.   Mr. Abbot was instructed to call 911 with any severe reactions post vaccine: Marland Kitchen Difficulty breathing  . Swelling of face and throat  . A fast heartbeat  . A bad rash all over body  . Dizziness and weakness   Immunizations Administered    Name Date Dose VIS Date Route   Moderna COVID-19 Vaccine 08/16/2019  2:34 PM 0.5 mL 05/09/2019 Intramuscular   Manufacturer: Moderna   Lot: [id number]   [id number]

## 2019-08-22 ENCOUNTER — Ambulatory Visit (INDEPENDENT_AMBULATORY_CARE_PROVIDER_SITE_OTHER): Payer: Medicare HMO | Admitting: Family Medicine

## 2019-08-22 ENCOUNTER — Other Ambulatory Visit: Payer: Self-pay

## 2019-08-22 DIAGNOSIS — I10 Essential (primary) hypertension: Secondary | ICD-10-CM

## 2019-08-22 DIAGNOSIS — F32A Depression, unspecified: Secondary | ICD-10-CM

## 2019-08-22 DIAGNOSIS — F329 Major depressive disorder, single episode, unspecified: Secondary | ICD-10-CM

## 2019-08-22 DIAGNOSIS — E785 Hyperlipidemia, unspecified: Secondary | ICD-10-CM | POA: Diagnosis not present

## 2019-08-22 MED ORDER — ENALAPRIL MALEATE 20 MG PO TABS: 20.0000 mg | ORAL_TABLET | Freq: Every day | ORAL | 1 refills | Status: DC

## 2019-08-22 MED ORDER — DOXAZOSIN MESYLATE 8 MG PO TABS: ORAL_TABLET | ORAL | 1 refills | Status: DC

## 2019-08-22 MED ORDER — PAROXETINE HCL 20 MG PO TABS: 20.0000 mg | ORAL_TABLET | Freq: Every day | ORAL | 1 refills | Status: DC

## 2019-08-22 MED ORDER — VERAPAMIL HCL ER 180 MG PO TBCR: 180.0000 mg | EXTENDED_RELEASE_TABLET | Freq: Two times a day (BID) | ORAL | 1 refills | Status: DC

## 2019-08-22 MED ORDER — DICLOFENAC SODIUM 75 MG PO TBEC: DELAYED_RELEASE_TABLET | ORAL | 5 refills | Status: DC

## 2019-08-22 MED ORDER — PANTOPRAZOLE SODIUM 40 MG PO TBEC: 40.0000 mg | DELAYED_RELEASE_TABLET | Freq: Two times a day (BID) | ORAL | 1 refills | Status: DC

## 2019-08-22 MED ORDER — PRAVASTATIN SODIUM 20 MG PO TABS: ORAL_TABLET | ORAL | 1 refills | Status: DC

## 2019-08-22 NOTE — Progress Notes (Signed)
   Subjective:    Patient ID: Adam [name], male    DOB: 01-04-1951, 69 y.o.   MRN: [id number]  Hypertension This is a chronic problem. The current episode started more than 1 year ago. Risk factors for coronary artery disease include dyslipidemia and male gender. Treatments tried: Cardura, Verapamil, Enalapril.  Patient requests refill for the Voltaren for knee pain.    Review of Systems Virtual Visit via Video Note  I connected with Adam [name] on 08/22/19 at  9:00 AM EDT by a video enabled telemedicine application and verified that I am speaking with the correct person using two identifiers.  Location: Patient: home Provider: office   I discussed the limitations of evaluation and management by telemedicine and the availability of in person appointments. The patient expressed understanding and agreed to proceed.  History of Present Illness:    Observations/Objective:   Assessment and Plan:   Follow Up Instructions:    I discussed the assessment and treatment plan with the patient. The patient was provided an opportunity to ask questions and all were answered. The patient agreed with the plan and demonstrated an understanding of the instructions.   The patient was advised to call back or seek an in-person evaluation if the symptoms worsen or if the condition fails to improve as anticipated.  I provided 23 minutes of non-face-to-face time during this encounter.  Blood pressure medicine and blood pressure levels reviewed today with patient. Compliant with blood pressure medicine. States does not miss a dose. No obvious side effects. Blood pressure generally good when checked elsewhere. Watching salt intake.  Patient takes Paxil daily for chronic mild depression. States it definitely helps and wants to stay on. No suicidal or homicidal thoughts.  Compliant with lipid medication. Numbers excellent last time checked    Objective:   Physical  Exam  Virtual      Assessment & Plan:  Impression 1 hypertension. Currently controlled discussed maintain same  2. Hyperlipidemia. Recent blood work showed maintain same patient prefers to hold off on blood work at this time and we will monitor that.  3. Chronic depression mild stable in good control. Will maintain medication  All meds for 6 months. Diet exercise discussed follow-up in

## 2019-09-12 NOTE — Progress Notes (Signed)
REVIEWED-NO ADDITIONAL RECOMMENDATIONS. 

## 2019-09-22 ENCOUNTER — Encounter (HOSPITAL_COMMUNITY): Payer: Self-pay

## 2019-09-22 ENCOUNTER — Emergency Department (HOSPITAL_COMMUNITY)
Admission: EM | Admit: 2019-09-22 | Discharge: 2019-09-23 | Disposition: A | Discharge status: Home / Self Care | Payer: Medicare HMO | Attending: Emergency Medicine | Admitting: Emergency Medicine

## 2019-09-22 DIAGNOSIS — I10 Essential (primary) hypertension: Secondary | ICD-10-CM | POA: Diagnosis not present

## 2019-09-22 DIAGNOSIS — Y999 Unspecified external cause status: Secondary | ICD-10-CM | POA: Insufficient documentation

## 2019-09-22 DIAGNOSIS — S30811A Abrasion of abdominal wall, initial encounter: Secondary | ICD-10-CM | POA: Diagnosis not present

## 2019-09-22 DIAGNOSIS — S80811A Abrasion, right lower leg, initial encounter: Secondary | ICD-10-CM | POA: Diagnosis not present

## 2019-09-22 DIAGNOSIS — R4182 Altered mental status, unspecified: Secondary | ICD-10-CM | POA: Insufficient documentation

## 2019-09-22 DIAGNOSIS — Y92410 Unspecified street and highway as the place of occurrence of the external cause: Secondary | ICD-10-CM | POA: Diagnosis not present

## 2019-09-22 DIAGNOSIS — S40811A Abrasion of right upper arm, initial encounter: Secondary | ICD-10-CM | POA: Insufficient documentation

## 2019-09-22 DIAGNOSIS — S0101XA Laceration without foreign body of scalp, initial encounter: Secondary | ICD-10-CM | POA: Diagnosis not present

## 2019-09-22 DIAGNOSIS — S80812A Abrasion, left lower leg, initial encounter: Secondary | ICD-10-CM | POA: Diagnosis not present

## 2019-09-22 DIAGNOSIS — S0081XA Abrasion of other part of head, initial encounter: Secondary | ICD-10-CM | POA: Diagnosis not present

## 2019-09-22 DIAGNOSIS — Y9389 Activity, other specified: Secondary | ICD-10-CM | POA: Insufficient documentation

## 2019-09-22 DIAGNOSIS — Y906 Blood alcohol level of 120-199 mg/100 ml: Secondary | ICD-10-CM | POA: Diagnosis not present

## 2019-09-22 DIAGNOSIS — S3991XA Unspecified injury of abdomen, initial encounter: Secondary | ICD-10-CM | POA: Diagnosis present

## 2019-09-22 DIAGNOSIS — Z23 Encounter for immunization: Secondary | ICD-10-CM | POA: Diagnosis not present

## 2019-09-22 DIAGNOSIS — F10929 Alcohol use, unspecified with intoxication, unspecified: Secondary | ICD-10-CM | POA: Diagnosis not present

## 2019-09-22 DIAGNOSIS — S301XXA Contusion of abdominal wall, initial encounter: Secondary | ICD-10-CM | POA: Diagnosis not present

## 2019-09-22 DIAGNOSIS — S40812A Abrasion of left upper arm, initial encounter: Secondary | ICD-10-CM | POA: Insufficient documentation

## 2019-09-22 DIAGNOSIS — T07XXXA Unspecified multiple injuries, initial encounter: Secondary | ICD-10-CM

## 2019-09-22 DIAGNOSIS — S299XXA Unspecified injury of thorax, initial encounter: Secondary | ICD-10-CM | POA: Diagnosis not present

## 2019-09-22 DIAGNOSIS — S199XXA Unspecified injury of neck, initial encounter: Secondary | ICD-10-CM | POA: Diagnosis not present

## 2019-09-22 LAB — I-STAT CHEM 8, ED
BUN: 23 mg/dL (ref 8–23)
Calcium, Ion: 1.04 mmol/L — ABNORMAL LOW (ref 1.15–1.40)
Chloride: 104 mmol/L (ref 98–111)
Creatinine, Ser: 1.2 mg/dL (ref 0.61–1.24)
Glucose, Bld: 69 mg/dL — ABNORMAL LOW (ref 70–99)
HCT: 42 % (ref 39.0–52.0)
Hemoglobin: 14.3 g/dL (ref 13.0–17.0)
Potassium: 3.4 mmol/L — ABNORMAL LOW (ref 3.5–5.1)
Sodium: 139 mmol/L (ref 135–145)
TCO2: 25 mmol/L (ref 22–32)

## 2019-09-22 LAB — CBC
HCT: 42.7 % (ref 39.0–52.0)
Hemoglobin: 14.2 g/dL (ref 13.0–17.0)
MCH: 29.3 pg (ref 26.0–34.0)
MCHC: 33.3 g/dL (ref 30.0–36.0)
MCV: 88.2 fL (ref 80.0–100.0)
Platelets: 309 10*3/uL (ref 150–400)
RBC: 4.84 MIL/uL (ref 4.22–5.81)
RDW: 13.4 % (ref 11.5–15.5)
WBC: 9.2 10*3/uL (ref 4.0–10.5)
nRBC: 0 % (ref 0.0–0.2)

## 2019-09-22 LAB — ETHANOL: Alcohol, Ethyl (B): 178 mg/dL — ABNORMAL HIGH (ref <10)

## 2019-09-22 MED ORDER — SODIUM CHLORIDE 0.9 % IV BOLUS
1000.0000 mL | Freq: Once | INTRAVENOUS | Status: AC
  Administered 2019-09-22: 1000 mL via INTRAVENOUS

## 2019-09-22 MED ORDER — SODIUM CHLORIDE 0.9 % IV SOLN: INTRAVENOUS | Status: DC

## 2019-09-22 MED ORDER — TETANUS-DIPHTH-ACELL PERTUSSIS 5-2.5-18.5 LF-MCG/0.5 IM SUSP
0.5000 mL | Freq: Once | INTRAMUSCULAR | Status: AC
  Administered 2019-09-22: 0.5 mL via INTRAMUSCULAR
  Filled 2019-09-22: qty 0.5
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
## 2019-09-23 ENCOUNTER — Emergency Department (HOSPITAL_COMMUNITY): Payer: Medicare HMO

## 2019-09-23 DIAGNOSIS — S199XXA Unspecified injury of neck, initial encounter: Secondary | ICD-10-CM | POA: Diagnosis not present

## 2019-09-23 DIAGNOSIS — S299XXA Unspecified injury of thorax, initial encounter: Secondary | ICD-10-CM | POA: Diagnosis not present

## 2019-09-23 DIAGNOSIS — S0101XA Laceration without foreign body of scalp, initial encounter: Secondary | ICD-10-CM | POA: Diagnosis not present

## 2019-09-23 DIAGNOSIS — S301XXA Contusion of abdominal wall, initial encounter: Secondary | ICD-10-CM | POA: Diagnosis not present

## 2019-09-23 LAB — URINALYSIS, ROUTINE W REFLEX MICROSCOPIC
Bilirubin Urine: NEGATIVE
Glucose, UA: NEGATIVE mg/dL
Ketones, ur: NEGATIVE mg/dL
Leukocytes,Ua: NEGATIVE
Nitrite: NEGATIVE
Protein, ur: 30 mg/dL — AB
Specific Gravity, Urine: 1.009 (ref 1.005–1.030)
pH: 6 (ref 5.0–8.0)

## 2019-09-23 LAB — COMPREHENSIVE METABOLIC PANEL
ALT: 37 U/L (ref 0–44)
AST: 48 U/L — ABNORMAL HIGH (ref 15–41)
Albumin: 3.9 g/dL (ref 3.5–5.0)
Alkaline Phosphatase: 45 U/L (ref 38–126)
Anion gap: 11 (ref 5–15)
BUN: 20 mg/dL (ref 8–23)
CO2: 23 mmol/L (ref 22–32)
Calcium: 8.9 mg/dL (ref 8.9–10.3)
Chloride: 106 mmol/L (ref 98–111)
Creatinine, Ser: 1.11 mg/dL (ref 0.61–1.24)
GFR calc Af Amer: >60 mL/min (ref >60)
GFR calc non Af Amer: >60 mL/min (ref >60)
Glucose, Bld: 78 mg/dL (ref 70–99)
Potassium: 3.6 mmol/L (ref 3.5–5.1)
Sodium: 140 mmol/L (ref 135–145)
Total Bilirubin: 0.8 mg/dL (ref 0.3–1.2)
Total Protein: 6.2 g/dL — ABNORMAL LOW (ref 6.5–8.1)

## 2019-09-23 MED ORDER — IOHEXOL 300 MG/ML  SOLN
100.0000 mL | Freq: Once | INTRAMUSCULAR | Status: AC | PRN
  Administered 2019-09-23: 100 mL via INTRAVENOUS

## 2019-09-23 NOTE — Discharge Instructions (Signed)
During the workup we noted incidental findings on your imaging that would require you to follow-up with your regular doctor for further evaluation/management:   IMPRESSION:  1. Contusion along the left flank. Small fat containing inferior  lumbar hernia on the left is likely traumatic in nature given some  adjacent soft tissue stranding/hemorrhage in the left posterior  pararenal space. No associated diaphragmatic discontinuity is  evident at this time.  2. Mild edematous thickening of the left adrenal gland is  nonspecific but could be seen with either a stress response or  adrenal hemorrhage or contusion though no other features of blunt  injury in the direct vicinity are present.  3. No other acute traumatic injury seen in the chest, abdomen or  pelvis.  4. Colonic diverticulosis without evidence of diverticulitis.  5. Bilateral fat containing inguinal hernias, enlarged from prior  exam with a small bladder ear protruding into the right inguinal  canal. No bowel containing hernias.  6. Aortic Atherosclerosis (ICD10-I70.0).  7. Coronary artery calcifications are present. Please note that the  presence of coronary artery calcium documents the presence of  coronary artery disease, the severity of this disease and any  potential stenosis cannot be assessed on this non-gated CT  examination. Assessment for potential risk factor modification,  dietary therapy or pharmacologic therapy may be warranted.

## 2019-09-25 ENCOUNTER — Ambulatory Visit (INDEPENDENT_AMBULATORY_CARE_PROVIDER_SITE_OTHER): Payer: Medicare HMO | Admitting: Family Medicine

## 2019-09-25 ENCOUNTER — Encounter: Payer: Self-pay | Admitting: Family Medicine

## 2019-09-25 ENCOUNTER — Other Ambulatory Visit: Payer: Self-pay

## 2019-09-25 DIAGNOSIS — S0101XD Laceration without foreign body of scalp, subsequent encounter: Secondary | ICD-10-CM | POA: Diagnosis not present

## 2019-09-25 DIAGNOSIS — T07XXXA Unspecified multiple injuries, initial encounter: Secondary | ICD-10-CM | POA: Diagnosis not present

## 2019-09-25 DIAGNOSIS — M79605 Pain in left leg: Secondary | ICD-10-CM

## 2019-09-25 DIAGNOSIS — I10 Essential (primary) hypertension: Secondary | ICD-10-CM | POA: Diagnosis not present

## 2019-09-25 DIAGNOSIS — M79601 Pain in right arm: Secondary | ICD-10-CM

## 2019-09-25 DIAGNOSIS — F1010 Alcohol abuse, uncomplicated: Secondary | ICD-10-CM | POA: Diagnosis not present
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
## 2019-09-25 NOTE — Patient Instructions (Signed)
Call or return if having elevated bp over 160/90.

## 2019-10-16 ENCOUNTER — Other Ambulatory Visit: Payer: Self-pay

## 2019-10-16 ENCOUNTER — Ambulatory Visit (INDEPENDENT_AMBULATORY_CARE_PROVIDER_SITE_OTHER): Payer: Medicare HMO | Admitting: Family Medicine

## 2019-10-16 ENCOUNTER — Encounter: Payer: Self-pay | Admitting: Family Medicine

## 2019-10-16 VITALS — BP 140/76 | Temp 96.9°F | Wt 169.4 lb

## 2019-10-16 DIAGNOSIS — I10 Essential (primary) hypertension: Secondary | ICD-10-CM | POA: Diagnosis not present

## 2019-10-16 DIAGNOSIS — E785 Hyperlipidemia, unspecified: Secondary | ICD-10-CM

## 2019-10-16 DIAGNOSIS — F329 Major depressive disorder, single episode, unspecified: Secondary | ICD-10-CM

## 2019-10-16 DIAGNOSIS — Z79899 Other long term (current) drug therapy: Secondary | ICD-10-CM | POA: Diagnosis not present

## 2019-10-16 DIAGNOSIS — F32A Depression, unspecified: Secondary | ICD-10-CM

## 2019-10-16 MED ORDER — VERAPAMIL HCL ER 180 MG PO TBCR: 180.0000 mg | EXTENDED_RELEASE_TABLET | Freq: Two times a day (BID) | ORAL | 1 refills | Status: DC

## 2019-10-16 MED ORDER — PANTOPRAZOLE SODIUM 40 MG PO TBEC: 40.0000 mg | DELAYED_RELEASE_TABLET | Freq: Two times a day (BID) | ORAL | 1 refills | Status: DC

## 2019-10-16 MED ORDER — PAROXETINE HCL 20 MG PO TABS: 20.0000 mg | ORAL_TABLET | Freq: Every day | ORAL | 1 refills | Status: DC

## 2019-10-16 MED ORDER — DOXAZOSIN MESYLATE 8 MG PO TABS: ORAL_TABLET | ORAL | 1 refills | Status: DC

## 2019-10-16 MED ORDER — PRAVASTATIN SODIUM 20 MG PO TABS: 20.0000 mg | ORAL_TABLET | Freq: Every day | ORAL | 1 refills | Status: DC

## 2019-10-16 MED ORDER — ENALAPRIL MALEATE 20 MG PO TABS: 20.0000 mg | ORAL_TABLET | Freq: Every day | ORAL | 1 refills | Status: DC
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
## 2019-10-23 ENCOUNTER — Other Ambulatory Visit: Payer: Self-pay | Admitting: Family Medicine

## 2019-10-23 DIAGNOSIS — E785 Hyperlipidemia, unspecified: Secondary | ICD-10-CM | POA: Diagnosis not present

## 2019-10-23 DIAGNOSIS — I10 Essential (primary) hypertension: Secondary | ICD-10-CM | POA: Diagnosis not present

## 2019-10-23 DIAGNOSIS — Z79899 Other long term (current) drug therapy: Secondary | ICD-10-CM | POA: Diagnosis not present

## 2019-10-24 LAB — LIPID PANEL
Cholesterol: 189 mg/dL (ref <200)
HDL: 62 mg/dL (ref >=40)
LDL Cholesterol (Calc): 113 mg/dL (calc) — ABNORMAL HIGH
Non-HDL Cholesterol (Calc): 127 mg/dL (calc) (ref <130)
Total CHOL/HDL Ratio: 3 (calc) (ref <5.0)
Triglycerides: 58 mg/dL (ref <150)

## 2019-10-24 LAB — HEPATIC FUNCTION PANEL
AG Ratio: 2.1 (calc) (ref 1.0–2.5)
ALT: 26 U/L (ref 9–46)
AST: 25 U/L (ref 10–35)
Albumin: 4.1 g/dL (ref 3.6–5.1)
Alkaline phosphatase (APISO): 49 U/L (ref 35–144)
Bilirubin, Direct: 0.1 mg/dL (ref 0.0–0.2)
Globulin: 2 g/dL (calc) (ref 1.9–3.7)
Indirect Bilirubin: 0.7 mg/dL (calc) (ref 0.2–1.2)
Total Bilirubin: 0.8 mg/dL (ref 0.2–1.2)
Total Protein: 6.1 g/dL (ref 6.1–8.1)

## 2019-10-26 ENCOUNTER — Encounter: Payer: Self-pay | Admitting: Family Medicine

## 2019-10-27 ENCOUNTER — Telehealth: Payer: Self-pay | Admitting: Family Medicine

## 2019-10-27 DIAGNOSIS — M25519 Pain in unspecified shoulder: Secondary | ICD-10-CM

## 2019-10-27 DIAGNOSIS — G5600 Carpal tunnel syndrome, unspecified upper limb: Secondary | ICD-10-CM

## 2019-10-27 NOTE — Telephone Encounter (Signed)
Pt contacted and verbalized understanding. Referral placed for hand specialist. Pt is also wanting a referral to [facility] for shoulder pain. Please advise. Thank you

## 2019-10-27 NOTE — Telephone Encounter (Signed)
sure

## 2019-10-27 NOTE — Telephone Encounter (Signed)
Patient was here on 5/10 and said Dr. Richardson Landry told him to call back if the pain and numbness in his arms was getting worse.  He said it has and both hands are numb, more than before. He didn't know what the next step was? Referral?  Patient home # [phone] or cell # [phone]

## 2019-10-27 NOTE — Telephone Encounter (Signed)
Referral to [facility] placed also. Pt is aware

## 2019-10-27 NOTE — Telephone Encounter (Signed)
Hand specialist for cts

## 2019-11-08 ENCOUNTER — Other Ambulatory Visit: Payer: Self-pay | Admitting: Family Medicine

## 2019-11-14 DIAGNOSIS — M19012 Primary osteoarthritis, left shoulder: Secondary | ICD-10-CM | POA: Diagnosis not present

## 2019-11-14 DIAGNOSIS — M542 Cervicalgia: Secondary | ICD-10-CM | POA: Diagnosis not present

## 2019-11-15 ENCOUNTER — Encounter: Payer: Self-pay | Admitting: Family Medicine

## 2019-11-28 DIAGNOSIS — M542 Cervicalgia: Secondary | ICD-10-CM | POA: Diagnosis not present

## 2019-12-15 ENCOUNTER — Other Ambulatory Visit: Payer: Self-pay | Admitting: Family Medicine

## 2019-12-15 DIAGNOSIS — G5600 Carpal tunnel syndrome, unspecified upper limb: Secondary | ICD-10-CM | POA: Diagnosis not present

## 2019-12-15 DIAGNOSIS — Z6828 Body mass index (BMI) 28.0-28.9, adult: Secondary | ICD-10-CM | POA: Insufficient documentation

## 2019-12-15 DIAGNOSIS — M5412 Radiculopathy, cervical region: Secondary | ICD-10-CM | POA: Diagnosis not present

## 2019-12-15 DIAGNOSIS — G959 Disease of spinal cord, unspecified: Secondary | ICD-10-CM | POA: Diagnosis not present

## 2019-12-19 NOTE — Telephone Encounter (Signed)
Med check up on 10/16/19

## 2020-01-12 DIAGNOSIS — G5600 Carpal tunnel syndrome, unspecified upper limb: Secondary | ICD-10-CM | POA: Insufficient documentation

## 2020-01-12 DIAGNOSIS — G5603 Carpal tunnel syndrome, bilateral upper limbs: Secondary | ICD-10-CM | POA: Diagnosis not present

## 2020-01-12 DIAGNOSIS — M5412 Radiculopathy, cervical region: Secondary | ICD-10-CM | POA: Diagnosis not present

## 2020-03-07 ENCOUNTER — Other Ambulatory Visit: Payer: Self-pay | Admitting: *Deleted

## 2020-03-07 MED ORDER — ENALAPRIL MALEATE 20 MG PO TABS: 20.0000 mg | ORAL_TABLET | Freq: Every day | ORAL | 0 refills | Status: DC

## 2020-03-07 MED ORDER — PRAVASTATIN SODIUM 20 MG PO TABS: 20.0000 mg | ORAL_TABLET | Freq: Every day | ORAL | 0 refills | Status: DC

## 2020-03-07 MED ORDER — DOXAZOSIN MESYLATE 8 MG PO TABS: ORAL_TABLET | ORAL | 0 refills | Status: DC

## 2020-03-07 MED ORDER — PANTOPRAZOLE SODIUM 40 MG PO TBEC: 40.0000 mg | DELAYED_RELEASE_TABLET | Freq: Two times a day (BID) | ORAL | 0 refills | Status: DC

## 2020-03-12 ENCOUNTER — Telehealth: Payer: Self-pay | Admitting: *Deleted

## 2020-03-13 NOTE — Telephone Encounter (Signed)
error 

## 2020-03-19 ENCOUNTER — Telehealth: Payer: Self-pay

## 2020-03-19 NOTE — Telephone Encounter (Signed)
Lipid and liver done in May. Please advise if labs are needed.

## 2020-03-19 NOTE — Telephone Encounter (Signed)
Patient has physical on 11/23 and needing labs done

## 2020-03-25 ENCOUNTER — Other Ambulatory Visit: Payer: Self-pay | Admitting: *Deleted

## 2020-03-25 DIAGNOSIS — E785 Hyperlipidemia, unspecified: Secondary | ICD-10-CM

## 2020-03-25 DIAGNOSIS — Z79899 Other long term (current) drug therapy: Secondary | ICD-10-CM

## 2020-03-25 DIAGNOSIS — I10 Essential (primary) hypertension: Secondary | ICD-10-CM

## 2020-03-25 NOTE — Telephone Encounter (Signed)
Labs ordered, lm notifying patient.

## 2020-04-11 ENCOUNTER — Other Ambulatory Visit: Payer: Self-pay | Admitting: Family Medicine

## 2020-04-19 DIAGNOSIS — E785 Hyperlipidemia, unspecified: Secondary | ICD-10-CM | POA: Diagnosis not present

## 2020-04-19 DIAGNOSIS — I10 Essential (primary) hypertension: Secondary | ICD-10-CM | POA: Diagnosis not present

## 2020-04-19 DIAGNOSIS — Z79899 Other long term (current) drug therapy: Secondary | ICD-10-CM | POA: Diagnosis not present

## 2020-04-20 LAB — CBC WITH DIFFERENTIAL/PLATELET
Basophils Absolute: 0.1 10*3/uL (ref 0.0–0.2)
Basos: 1 %
EOS (ABSOLUTE): 0.3 10*3/uL (ref 0.0–0.4)
Eos: 4 %
Hematocrit: 43.2 % (ref 37.5–51.0)
Hemoglobin: 14.2 g/dL (ref 13.0–17.7)
Immature Grans (Abs): 0 10*3/uL (ref 0.0–0.1)
Immature Granulocytes: 0 %
Lymphocytes Absolute: 1.6 10*3/uL (ref 0.7–3.1)
Lymphs: 28 %
MCH: 28.3 pg (ref 26.6–33.0)
MCHC: 32.9 g/dL (ref 31.5–35.7)
MCV: 86 fL (ref 79–97)
Monocytes Absolute: 0.6 10*3/uL (ref 0.1–0.9)
Monocytes: 9 %
Neutrophils Absolute: 3.4 10*3/uL (ref 1.4–7.0)
Neutrophils: 58 %
Platelets: 285 10*3/uL (ref 150–450)
RBC: 5.02 x10E6/uL (ref 4.14–5.80)
RDW: 13.5 % (ref 11.6–15.4)
WBC: 5.9 10*3/uL (ref 3.4–10.8)

## 2020-04-20 LAB — CMP14+EGFR
ALT: 24 IU/L (ref 0–44)
AST: 25 IU/L (ref 0–40)
Albumin/Globulin Ratio: 2 (ref 1.2–2.2)
Albumin: 4.4 g/dL (ref 3.7–4.7)
Alkaline Phosphatase: 55 IU/L (ref 44–121)
BUN/Creatinine Ratio: 12 (ref 10–24)
BUN: 14 mg/dL (ref 8–27)
Bilirubin Total: 0.6 mg/dL (ref 0.0–1.2)
CO2: 24 mmol/L (ref 20–29)
Calcium: 9.4 mg/dL (ref 8.6–10.2)
Chloride: 105 mmol/L (ref 96–106)
Creatinine, Ser: 1.17 mg/dL (ref 0.76–1.27)
GFR calc Af Amer: 72 mL/min/{1.73_m2} (ref >59)
GFR calc non Af Amer: 62 mL/min/{1.73_m2} (ref >59)
Globulin, Total: 2.2 g/dL (ref 1.5–4.5)
Glucose: 86 mg/dL (ref 65–99)
Potassium: 4 mmol/L (ref 3.5–5.2)
Sodium: 143 mmol/L (ref 134–144)
Total Protein: 6.6 g/dL (ref 6.0–8.5)

## 2020-04-20 LAB — LIPID PANEL
Chol/HDL Ratio: 2.6 ratio (ref 0.0–5.0)
Cholesterol, Total: 195 mg/dL (ref 100–199)
HDL: 76 mg/dL (ref >39)
LDL Chol Calc (NIH): 111 mg/dL — ABNORMAL HIGH (ref 0–99)
Triglycerides: 40 mg/dL (ref 0–149)
VLDL Cholesterol Cal: 8 mg/dL (ref 5–40)

## 2020-04-30 ENCOUNTER — Encounter: Payer: Self-pay | Admitting: Family Medicine

## 2020-04-30 ENCOUNTER — Other Ambulatory Visit: Payer: Self-pay

## 2020-04-30 ENCOUNTER — Ambulatory Visit (INDEPENDENT_AMBULATORY_CARE_PROVIDER_SITE_OTHER): Payer: Medicare HMO | Admitting: Family Medicine

## 2020-04-30 VITALS — BP 138/84 | HR 83 | Temp 97.6°F | Ht 63.5 in | Wt 169.0 lb

## 2020-04-30 DIAGNOSIS — E785 Hyperlipidemia, unspecified: Secondary | ICD-10-CM | POA: Diagnosis not present

## 2020-04-30 DIAGNOSIS — Z23 Encounter for immunization: Secondary | ICD-10-CM | POA: Diagnosis not present

## 2020-04-30 DIAGNOSIS — I10 Essential (primary) hypertension: Secondary | ICD-10-CM | POA: Diagnosis not present

## 2020-04-30 DIAGNOSIS — Z0000 Encounter for general adult medical examination without abnormal findings: Secondary | ICD-10-CM | POA: Diagnosis not present

## 2020-04-30 DIAGNOSIS — F320 Major depressive disorder, single episode, mild: Secondary | ICD-10-CM

## 2020-04-30 DIAGNOSIS — K219 Gastro-esophageal reflux disease without esophagitis: Secondary | ICD-10-CM | POA: Diagnosis not present

## 2020-04-30 MED ORDER — PANTOPRAZOLE SODIUM 40 MG PO TBEC: DELAYED_RELEASE_TABLET | ORAL | 1 refills | Status: DC

## 2020-04-30 MED ORDER — PRAVASTATIN SODIUM 20 MG PO TABS: ORAL_TABLET | ORAL | 1 refills | Status: DC

## 2020-04-30 MED ORDER — PAROXETINE HCL 20 MG PO TABS: 20.0000 mg | ORAL_TABLET | Freq: Every day | ORAL | 1 refills | Status: DC

## 2020-04-30 MED ORDER — ENALAPRIL MALEATE 20 MG PO TABS: ORAL_TABLET | ORAL | 1 refills | Status: DC

## 2020-04-30 MED ORDER — VERAPAMIL HCL ER 180 MG PO TBCR: 180.0000 mg | EXTENDED_RELEASE_TABLET | Freq: Two times a day (BID) | ORAL | 1 refills | Status: DC

## 2020-04-30 MED ORDER — DOXAZOSIN MESYLATE 8 MG PO TABS: ORAL_TABLET | ORAL | 1 refills | Status: DC

## 2020-04-30 NOTE — Progress Notes (Signed)
Patient ID: Adam [name], male    DOB: 04/23/1951, 69 y.o.   MRN: [id number]   Chief Complaint  Patient presents with  . Annual Exam  . Hypertension  . Hyperlipidemia   Subjective:    HPI AWV- Annual Wellness Visit  The patient was seen for their annual wellness visit. The patient's past medical history, surgical history, and family history were reviewed. Pertinent vaccines were reviewed ( tetanus, pneumonia, shingles, flu) The patient's medication list was reviewed and updated.  The height and weight were entered.  BMI recorded in electronic record elsewhere  Cognitive screening was completed. Outcome of Mini - Cog: pass   Falls /depression screening electronically recorded within record elsewhere  Current tobacco usage: none (All patients who use tobacco were given written and verbal information on quitting)  Recent listing of emergency department/hospitalizations over the past year were reviewed.  current specialist the patient sees on a regular basis: none   Medicare annual wellness visit patient questionnaire was reviewed.  A written screening schedule for the patient for the next 5-10 years was given. Appropriate discussion of followup regarding next visit was discussed.   gerd-Doing well with gerd and protonix 40mg  bid.  bp meds- doing well.  Doesn't have them yet today. No cp, sob, leg swelling, ha or blurry vision.  paxil for anxiety/depression.- doing well, no new concerns.  hld- doing well with meds. No muscle aches or joint pains.    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 Eugenia Pancoast), Gastric ulcer (08/31/2012), GERD (gastroesophageal reflux disease), HTN (hypertension), Hyperlipemia, IBS (irritable bowel syndrome), and Prostate hypertrophy.   Outpatient Encounter Medications as  of 04/30/2020  Medication Sig  . doxazosin (CARDURA) 8 MG tablet TAKE 1 TABLET BY MOUTH EVERYDAY AT BEDTIME  . enalapril (VASOTEC) 20 MG tablet TAKE 1 TABLET EVERY DAY  . loratadine (CLARITIN) 10 MG tablet Take 10 mg by mouth daily as needed for allergies.   . pantoprazole (PROTONIX) 40 MG tablet TAKE 1 TABLET TWICE DAILY  . PARoxetine (PAXIL) 20 MG tablet Take 1 tablet (20 mg total) by mouth daily.  . pravastatin (PRAVACHOL) 20 MG tablet TAKE 1 TABLET AT BEDTIME (NEED MD APPOINTMENT)  . verapamil (CALAN-SR) 180 MG CR tablet Take 1 tablet (180 mg total) by mouth 2 (two) times daily.  . [DISCONTINUED] doxazosin (CARDURA) 8 MG tablet TAKE 1 TABLET BY MOUTH EVERYDAY AT BEDTIME (NEED MD APPOINTMENT)  . [DISCONTINUED] enalapril (VASOTEC) 20 MG tablet TAKE 1 TABLET EVERY DAY (NEED MD APPOINTMENT)  . [DISCONTINUED] oxybutynin (DITROPAN) 5 MG tablet Take 5 mg by mouth daily.  . [DISCONTINUED] pantoprazole (PROTONIX) 40 MG tablet TAKE 1 TABLET TWICE DAILY (NEED MD APPOINTMENT)  . [DISCONTINUED] PARoxetine (PAXIL) 20 MG tablet Take 1 tablet (20 mg total) by mouth daily.  . [DISCONTINUED] pravastatin (PRAVACHOL) 20 MG tablet TAKE 1 TABLET AT BEDTIME (NEED MD APPOINTMENT)  . [DISCONTINUED] verapamil (CALAN-SR) 180 MG CR tablet Take 1 tablet (180 mg total) by mouth 2 (two) times daily.  . [DISCONTINUED] diclofenac (VOLTAREN) 75 MG EC tablet TAKE 1 TABLET TWICE DAILY WITH FOOD AS NEEDED FOR MILD PAIN (Patient not taking: Reported on 04/30/2020)   Facility-Administered Encounter Medications as of 04/30/2020  Medication  . methylPREDNISolone acetate (DEPO-MEDROL) injection 40 mg     Review of Systems  Constitutional: Negative for chills and fever.  HENT:  Negative for congestion, rhinorrhea and sore throat.   Respiratory: Negative for cough, shortness of breath and wheezing.   Cardiovascular: Negative for chest pain and leg swelling.  Gastrointestinal: Negative for abdominal pain, diarrhea, nausea and  vomiting.  Genitourinary: Negative for dysuria and frequency.  Skin: Negative for rash.  Neurological: Negative for dizziness, weakness and headaches.     Vitals BP 138/84   Pulse 83   Temp 97.6 F (36.4 C)   Ht 5' 3.5" (1.613 m)   Wt 169 lb (76.7 kg)   SpO2 99%   BMI 29.47 kg/m   Objective:   Physical Exam Vitals and nursing note reviewed.  Constitutional:      General: He is not in acute distress.    Appearance: Normal appearance. He is not ill-appearing.  HENT:     Head: Normocephalic.     Nose: Nose normal. No congestion.     Mouth/Throat:     Mouth: Mucous membranes are moist.     Pharynx: No oropharyngeal exudate.  Eyes:     Extraocular Movements: Extraocular movements intact.     Conjunctiva/sclera: Conjunctivae normal.     Pupils: Pupils are equal, round, and reactive to light.  Cardiovascular:     Rate and Rhythm: Normal rate and regular rhythm.     Pulses: Normal pulses.     Heart sounds: Normal heart sounds. No murmur heard.   Pulmonary:     Effort: Pulmonary effort is normal.     Breath sounds: Normal breath sounds. No wheezing, rhonchi or rales.  Musculoskeletal:        General: Normal range of motion.     Right lower leg: No edema.     Left lower leg: No edema.  Skin:    General: Skin is warm and dry.     Findings: No rash.  Neurological:     General: No focal deficit present.     Mental Status: He is alert and oriented to person, place, and time.     Cranial Nerves: No cranial nerve deficit.  Psychiatric:        Mood and Affect: Mood normal.        Behavior: Behavior normal.        Thought Content: Thought content normal.        Judgment: Judgment normal.      Assessment and Plan   1. Medicare annual wellness visit, subsequent  2. Need for vaccination - Flu Vaccine QUAD High Dose(Fluad)  3. Essential hypertension, benign - doxazosin (CARDURA) 8 MG tablet; TAKE 1 TABLET BY MOUTH EVERYDAY AT BEDTIME  Dispense: 90 tablet; Refill: 1 -  enalapril (VASOTEC) 20 MG tablet; TAKE 1 TABLET EVERY DAY  Dispense: 90 tablet; Refill: 1 - verapamil (CALAN-SR) 180 MG CR tablet; Take 1 tablet (180 mg total) by mouth 2 (two) times daily.  Dispense: 180 tablet; Refill: 1  4. Hyperlipidemia LDL goal <130 - pravastatin (PRAVACHOL) 20 MG tablet; TAKE 1 TABLET AT BEDTIME (NEED MD APPOINTMENT)  Dispense: 90 tablet; Refill: 1  5. Current mild episode of major depressive disorder without prior episode (HCC) - PARoxetine (PAXIL) 20 MG tablet; Take 1 tablet (20 mg total) by mouth daily.  Dispense: 90 tablet; Refill: 1  6. Gastroesophageal reflux disease without esophagitis - pantoprazole (PROTONIX) 40 MG tablet; TAKE 1 TABLET TWICE DAILY  Dispense: 180 tablet; Refill: 1   htn- suboptimal today. Pt stating hasn't had meds yet this am. Hasn't had the enalapril yet.  Will cont meds.  Check labs today.  Seeing eye doctor soon, 2/22.  Not taking oxybutinin. Wanting to get flu vaccine.  hld- stable, cont meds. Depression- stable, cont meds.  F/u 42mo or prn.

## 2020-07-12 DIAGNOSIS — Z0100 Encounter for examination of eyes and vision without abnormal findings: Secondary | ICD-10-CM | POA: Diagnosis not present

## 2020-07-12 DIAGNOSIS — H5210 Myopia, unspecified eye: Secondary | ICD-10-CM | POA: Diagnosis not present

## 2020-12-02 ENCOUNTER — Encounter: Payer: Self-pay | Admitting: Family Medicine

## 2020-12-02 ENCOUNTER — Other Ambulatory Visit: Payer: Self-pay

## 2020-12-02 ENCOUNTER — Ambulatory Visit (INDEPENDENT_AMBULATORY_CARE_PROVIDER_SITE_OTHER): Payer: Medicare HMO | Admitting: Family Medicine

## 2020-12-02 VITALS — BP 160/94 | HR 59 | Temp 97.2°F | Ht 63.5 in | Wt 168.0 lb

## 2020-12-02 DIAGNOSIS — E785 Hyperlipidemia, unspecified: Secondary | ICD-10-CM

## 2020-12-02 DIAGNOSIS — I10 Essential (primary) hypertension: Secondary | ICD-10-CM | POA: Diagnosis not present

## 2020-12-02 DIAGNOSIS — K219 Gastro-esophageal reflux disease without esophagitis: Secondary | ICD-10-CM | POA: Diagnosis not present

## 2020-12-02 DIAGNOSIS — F320 Major depressive disorder, single episode, mild: Secondary | ICD-10-CM

## 2020-12-02 DIAGNOSIS — Z125 Encounter for screening for malignant neoplasm of prostate: Secondary | ICD-10-CM

## 2020-12-02 MED ORDER — PRAVASTATIN SODIUM 20 MG PO TABS: ORAL_TABLET | ORAL | 1 refills | Status: DC

## 2020-12-02 MED ORDER — VERAPAMIL HCL ER 180 MG PO TBCR: 180.0000 mg | EXTENDED_RELEASE_TABLET | Freq: Two times a day (BID) | ORAL | 1 refills | Status: DC

## 2020-12-02 MED ORDER — ENALAPRIL MALEATE 20 MG PO TABS: ORAL_TABLET | ORAL | 1 refills | Status: DC

## 2020-12-02 MED ORDER — DOXAZOSIN MESYLATE 8 MG PO TABS: ORAL_TABLET | ORAL | 1 refills | Status: DC

## 2020-12-02 MED ORDER — PAROXETINE HCL 20 MG PO TABS: 20.0000 mg | ORAL_TABLET | Freq: Every day | ORAL | 1 refills | Status: DC

## 2020-12-02 MED ORDER — PANTOPRAZOLE SODIUM 40 MG PO TBEC: DELAYED_RELEASE_TABLET | ORAL | 1 refills | Status: DC

## 2020-12-02 NOTE — Progress Notes (Signed)
Patient ID: Adam [name], male    DOB: 1951/04/11, 70 y.o.   MRN: [id number]   Chief Complaint  Patient presents with   Hypertension    Subjective:    HPI Bp med. Pt did not take bp med this morning because he thought he needed to get bw first.   Feeling well.   Working in garden and didn't take meds.   Growing veggies.  HTN Pt compliant with BP meds.  No SEs Denies chest pain, sob, LE swelling, or blurry vision.    Medical History [place] has a past medical history of Allergy, BMI 31.0-31.9,adult (JUNE 2011 180 LBS ), Chronic back pain, Depression, Eosinophilic granuloma ([place]) (JAN 2011 GASTRIC, followed by Dr. Eugenia Pancoast), Gastric ulcer (08/31/2012), GERD (gastroesophageal reflux disease), HTN (hypertension), Hyperlipemia, IBS (irritable bowel syndrome), and Prostate hypertrophy.   Outpatient Encounter Medications as of 12/02/2020  Medication Sig   loratadine (CLARITIN) 10 MG tablet Take 10 mg by mouth daily as needed for allergies.    [DISCONTINUED] doxazosin (CARDURA) 8 MG tablet TAKE 1 TABLET BY MOUTH EVERYDAY AT BEDTIME   [DISCONTINUED] enalapril (VASOTEC) 20 MG tablet TAKE 1 TABLET EVERY DAY   [DISCONTINUED] pantoprazole (PROTONIX) 40 MG tablet TAKE 1 TABLET TWICE DAILY   [DISCONTINUED] PARoxetine (PAXIL) 20 MG tablet Take 1 tablet (20 mg total) by mouth daily.   [DISCONTINUED] pravastatin (PRAVACHOL) 20 MG tablet TAKE 1 TABLET AT BEDTIME (NEED MD APPOINTMENT)   [DISCONTINUED] verapamil (CALAN-SR) 180 MG CR tablet Take 1 tablet (180 mg total) by mouth 2 (two) times daily.   doxazosin (CARDURA) 8 MG tablet TAKE 1 TABLET BY MOUTH EVERYDAY AT BEDTIME   enalapril (VASOTEC) 20 MG tablet TAKE 1 TABLET EVERY DAY   pantoprazole (PROTONIX) 40 MG tablet TAKE 1 TABLET TWICE DAILY   PARoxetine (PAXIL) 20 MG tablet Take 1 tablet (20 mg total) by mouth daily.   pravastatin (PRAVACHOL) 20 MG tablet TAKE 1 TABLET AT BEDTIME (NEED MD APPOINTMENT)   verapamil (CALAN-SR) 180  MG CR tablet Take 1 tablet (180 mg total) by mouth 2 (two) times daily.   Facility-Administered Encounter Medications as of 12/02/2020  Medication   methylPREDNISolone acetate (DEPO-MEDROL) injection 40 mg     Review of Systems  Constitutional:  Negative for chills and fever.  HENT:  Negative for congestion, rhinorrhea and sore throat.   Respiratory:  Negative for cough, shortness of breath and wheezing.   Cardiovascular:  Negative for chest pain and leg swelling.  Gastrointestinal:  Negative for abdominal pain, diarrhea, nausea and vomiting.  Genitourinary:  Negative for dysuria and frequency.  Skin:  Negative for rash.  Neurological:  Negative for dizziness, weakness and headaches.    Vitals BP (!) 160/94   Pulse (!) 59   Temp (!) 97.2 F (36.2 C)   Ht 5' 3.5" (1.613 m)   Wt 168 lb (76.2 kg)   SpO2 98%   BMI 29.29 kg/m   Objective:   Physical Exam Vitals and nursing note reviewed.  Constitutional:      General: He is not in acute distress.    Appearance: Normal appearance. He is not ill-appearing.  HENT:     Head: Normocephalic.     Nose: Nose normal. No congestion.     Mouth/Throat:     Mouth: Mucous membranes are moist.     Pharynx: No oropharyngeal exudate.  Eyes:     Extraocular Movements: Extraocular movements intact.     Conjunctiva/sclera: Conjunctivae normal.     Pupils:  Pupils are equal, round, and reactive to light.  Cardiovascular:     Rate and Rhythm: Normal rate and regular rhythm.     Pulses: Normal pulses.     Heart sounds: Normal heart sounds. No murmur heard. Pulmonary:     Effort: Pulmonary effort is normal.     Breath sounds: Normal breath sounds. No wheezing, rhonchi or rales.  Musculoskeletal:        General: Normal range of motion.     Right lower leg: No edema.     Left lower leg: No edema.  Skin:    General: Skin is warm and dry.     Findings: No rash.  Neurological:     General: No focal deficit present.     Mental Status: He is  alert and oriented to person, place, and time.     Cranial Nerves: No cranial nerve deficit.  Psychiatric:        Mood and Affect: Mood normal.        Behavior: Behavior normal.        Thought Content: Thought content normal.        Judgment: Judgment normal.     Assessment and Plan   1. Essential hypertension, benign - CBC - CMP14+EGFR - doxazosin (CARDURA) 8 MG tablet; TAKE 1 TABLET BY MOUTH EVERYDAY AT BEDTIME  Dispense: 90 tablet; Refill: 1 - enalapril (VASOTEC) 20 MG tablet; TAKE 1 TABLET EVERY DAY  Dispense: 90 tablet; Refill: 1 - verapamil (CALAN-SR) 180 MG CR tablet; Take 1 tablet (180 mg total) by mouth 2 (two) times daily.  Dispense: 180 tablet; Refill: 1  2. Hyperlipidemia LDL goal <130 - CMP14+EGFR - Lipid panel - pravastatin (PRAVACHOL) 20 MG tablet; TAKE 1 TABLET AT BEDTIME (NEED MD APPOINTMENT)  Dispense: 90 tablet; Refill: 1  3. Screening PSA (prostate specific antigen) - PSA; Future  4. Gastroesophageal reflux disease without esophagitis - pantoprazole (PROTONIX) 40 MG tablet; TAKE 1 TABLET TWICE DAILY  Dispense: 180 tablet; Refill: 1  5. Current mild episode of major depressive disorder without prior episode (HCC) - PARoxetine (PAXIL) 20 MG tablet; Take 1 tablet (20 mg total) by mouth daily.  Dispense: 90 tablet; Refill: 1   Pt bp not controlled, pt didn't take meds this am due to pt thought needing labs, reviewed with pt okay to take meds before labs.    No follow-ups on file.   12/21/2020

## 2020-12-04 ENCOUNTER — Other Ambulatory Visit: Payer: Self-pay

## 2020-12-04 DIAGNOSIS — Z125 Encounter for screening for malignant neoplasm of prostate: Secondary | ICD-10-CM

## 2020-12-04 LAB — CBC
Hematocrit: 43.3 % (ref 37.5–51.0)
Hemoglobin: 14.6 g/dL (ref 13.0–17.7)
MCH: 28.3 pg (ref 26.6–33.0)
MCHC: 33.7 g/dL (ref 31.5–35.7)
MCV: 84 fL (ref 79–97)
Platelets: 303 10*3/uL (ref 150–450)
RBC: 5.15 x10E6/uL (ref 4.14–5.80)
RDW: 13.2 % (ref 11.6–15.4)
WBC: 6.4 10*3/uL (ref 3.4–10.8)

## 2020-12-04 LAB — LIPID PANEL
Chol/HDL Ratio: 2.8 ratio (ref 0.0–5.0)
Cholesterol, Total: 194 mg/dL (ref 100–199)
HDL: 69 mg/dL (ref >39)
LDL Chol Calc (NIH): 117 mg/dL — ABNORMAL HIGH (ref 0–99)
Triglycerides: 42 mg/dL (ref 0–149)
VLDL Cholesterol Cal: 8 mg/dL (ref 5–40)

## 2020-12-04 LAB — CMP14+EGFR
ALT: 18 IU/L (ref 0–44)
AST: 24 IU/L (ref 0–40)
Albumin/Globulin Ratio: 2 (ref 1.2–2.2)
Albumin: 4.1 g/dL (ref 3.7–4.7)
Alkaline Phosphatase: 60 IU/L (ref 44–121)
BUN/Creatinine Ratio: 14 (ref 10–24)
BUN: 17 mg/dL (ref 8–27)
Bilirubin Total: 0.6 mg/dL (ref 0.0–1.2)
CO2: 23 mmol/L (ref 20–29)
Calcium: 9.7 mg/dL (ref 8.6–10.2)
Chloride: 103 mmol/L (ref 96–106)
Creatinine, Ser: 1.21 mg/dL (ref 0.76–1.27)
Globulin, Total: 2.1 g/dL (ref 1.5–4.5)
Glucose: 92 mg/dL (ref 65–99)
Potassium: 4.3 mmol/L (ref 3.5–5.2)
Sodium: 144 mmol/L (ref 134–144)
Total Protein: 6.2 g/dL (ref 6.0–8.5)
eGFR: 64 mL/min/{1.73_m2} (ref >59)

## 2020-12-11 LAB — SPECIMEN STATUS REPORT

## 2021-02-11 ENCOUNTER — Ambulatory Visit
Admission: EM | Admit: 2021-02-11 | Discharge: 2021-02-11 | Disposition: A | Discharge status: Home / Self Care | Payer: Medicare HMO | Attending: Family Medicine | Admitting: Family Medicine

## 2021-02-11 ENCOUNTER — Other Ambulatory Visit: Payer: Self-pay

## 2021-02-11 ENCOUNTER — Encounter: Payer: Self-pay | Admitting: Emergency Medicine

## 2021-02-11 DIAGNOSIS — H60391 Other infective otitis externa, right ear: Secondary | ICD-10-CM | POA: Diagnosis not present

## 2021-02-11 DIAGNOSIS — H8110 Benign paroxysmal vertigo, unspecified ear: Secondary | ICD-10-CM

## 2021-02-11 MED ORDER — NEOMYCIN-POLYMYXIN-HC 3.5-10000-1 OT SUSP: 4.0000 [drp] | Freq: Three times a day (TID) | OTIC | 0 refills | Status: DC

## 2021-02-11 MED ORDER — PREDNISONE 20 MG PO TABS: 40.0000 mg | ORAL_TABLET | Freq: Every day | ORAL | 0 refills | Status: DC

## 2021-02-11 NOTE — Discharge Instructions (Addendum)
Follow up with your ENT doctor if not improving as anticipated.

## 2021-02-11 NOTE — ED Triage Notes (Signed)
RT ear feels clogged and pt has felt dizzy when bending over and standing up fast x 3 weeks

## 2021-02-11 NOTE — ED Provider Notes (Signed)
[facility]   [id number] 02/11/21 Arrival Time: 1033  ASSESSMENT & PLAN:  1. Other infective acute otitis externa of right ear   2. Benign paroxysmal positional vertigo, unspecified laterality    Normal neurologic exam. No suspicion for ICH or SAH. No indication for neurodiagnostic imaging at this time. Discussed.  Begin: Meds ordered this encounter  Medications   predniSONE (DELTASONE) 20 MG tablet    Sig: Take 2 tablets (40 mg total) by mouth daily.    Dispense:  10 tablet    Refill:  0   neomycin-polymyxin-hydrocortisone (CORTISPORIN) 3.5-10000-1 OTIC suspension    Sig: Place 4 drops into the right ear 3 (three) times daily.    Dispense:  10 mL    Refill:  0   Reassured that these symptoms do not appear to represent a serious or threatening condition. This is generally a self-limited temporary but uncomfortable situation. Rest, avoid potentially dangerous activities (such as driving or working with machinery or at heights). Use OTC Meclizine prn. Will proceed to the ED if he develops other symptoms such as alterations of speech, swallowing, vision, motor/sensory systems, or if dizziness worsens.  Reviewed expectations re: course of current medical issues. Questions answered. Outlined signs and symptoms indicating need for more acute intervention. Patient verbalized understanding. After Visit Summary given.   SUBJECTIVE:  Adam [name] is a 70 y.o. male who reports gradual onset of dizziness described as vertigo. Current symptoms first noted  on/off over past three weeks  and have stabilized. Aggravating factors: head movements. Denies coordination problems, gait problems, headaches, paresthesia, seizures, speech problems, and weakness as well as otalgia and tinnitus. Recent infections: none but does report R otalgia over past several days; "ear feels clogged"; no drainage or bleeding. Head trauma:  denied . Noise exposure:  none . No associated SOB, CP, or  palpatations reported. Recent travel: none. Reports normal bowel/bladder habits. H/O similar: no. No tx PTA.  Social History   Substance and Sexual Activity  Alcohol Use Yes   Comment: mixed drink 1-2 per mo   Social History   Tobacco Use  Smoking Status Former   Packs/day: 0.25   Years: 15.00   Pack years: 3.75   Types: Cigars, Cigarettes   Quit date: 04/29/2002   Years since quitting: 18.8  Smokeless Tobacco Never   Denies recreational drug use.  OBJECTIVE:  Vitals:   02/11/21 1153  BP: (!) 188/77  Pulse: (!) 56  Resp: 18  Temp: 98.3 F (36.8 C)  TempSrc: Oral  SpO2: 95%    General appearance: alert; no distress Eyes: PERRLA; EOMI; conjunctiva normal HENT: normocephalic; atraumatic; TMs normal; nasal mucosa normal; oral mucosa normal; R EAC erythematous/swollen Neck: supple with FROM Lungs: unlabored Heart: regular Extremities: no cyanosis or edema; symmetrical with no gross deformities Skin: warm and dry Neurologic: normal gait; DTR's normal and symmetric; CN 2-12 grossly intact; rapid changes in position during the exam do precipitate brief and mild vertigo Psychological: alert and cooperative; normal mood and affect   Allergies  Allergen Reactions   Sulfonamide Derivatives     REACTION: causes hives    Past Medical History:  Diagnosis Date   Allergy    BMI 31.0-31.9,adult JUNE 2011 180 LBS    Chronic back pain    Depression    Eosinophilic granuloma ([place]) JAN 2011 GASTRIC, followed by Dr. Eugenia Pancoast   EGD JAN 2011, JUN 2011, NOV 2011   Gastric ulcer 08/31/2012   GERD (gastroesophageal reflux disease)  HTN (hypertension)    Hyperlipemia    IBS (irritable bowel syndrome)    Prostate hypertrophy    Social History   Socioeconomic History   Marital status: Married    Spouse name: [name]   Number of children: 1   Years of education: 12   Highest education level: Not on file  Occupational History   Occupation: disabled  Tobacco Use    Smoking status: Former    Packs/day: 0.25    Years: 15.00    Pack years: 3.75    Types: Cigars, Cigarettes    Quit date: 04/29/2002    Years since quitting: 18.8   Smokeless tobacco: Never  Substance and Sexual Activity   Alcohol use: Yes    Comment: mixed drink 1-2 per mo   Drug use: No   Sexual activity: Yes    Birth control/protection: None  Other Topics Concern   Not on file  Social History Narrative   Lives w/ wife   Caffeine use: 1 cup coffee every morning   Social Determinants of Health   Financial Resource Strain: Not on file  Food Insecurity: Not on file  Transportation Needs: Not on file  Physical Activity: Not on file  Stress: Not on file  Social Connections: Not on file  Intimate Partner Violence: Not on file   Family History  Problem Relation Age of Onset   Hypertension Mother    Diabetes Mother    Hyperlipidemia Mother    Thyroid disease Mother    Hypertension Father    Diabetes Father    Stroke Father    Colon cancer Maternal Grandmother        OVER 60   Past Surgical History:  Procedure Laterality Date   BACK SURGERY     COLONOSCOPY  JAN 2011 COMPLETE   hypoTN and bradycardia w/ Demerol ,Phenergan and Versed, [place] TICS, SML IH   COLONOSCOPY  2003   NUR-normal   COLONOSCOPY  06/21/09   Fields-sigmoid diverticulosis, sm int hemorrhoids   COLONOSCOPY N/A 10/24/2013   Normal mucosa in the terminal ileum Moderate diverticulosis noted in the sigmoid colonModerate sized internal hemorrhoids. negative random colon bx.. next TCS 10/2023   Cyst Removed     from Left leg and knee   ESOPHAGOGASTRODUODENOSCOPY  05/20/2012   SLF: MILD ESOPHAGITIS.NO BARRETT'S/Multiple ulcers ranging between 3-5 mm/MILD Duodenal inflammation was found in the duodenal bulb   ESOPHAGOGASTRODUODENOSCOPY N/A 09/05/2012   SLF: MILD Non-erosive gastritis (inflammation) in the gastric antrum. Biopsies benign, no H. pylori.   ESOPHAGOGASTRODUODENOSCOPY N/A 10/24/2013   [id number]  DISTAL ESOPHAGEAL WEBSmall hiatal herniaMILD Non-erosive gastritis. SB bx benign. gastric bx, minimal inflammation.    GANGLION CYST EXCISION     from L wrist and pins placed for Fx involving L thumb   HAND SURGERY  at the age of 89   Brother accidentally cut it   MALONEY DILATION N/A 10/24/2013   Procedure: Venia Minks DILATION;  Surgeon: Danie Binder, MD;  [facility];  Service: Endoscopy;  Laterality: N/A;   MASS EXCISION  04/29/2012   Procedure: EXCISION MASS;  Surgeon: Donato Heinz, MD;  Location: [facility];  Service: General;  Laterality: Right;  Excision of Vascular Mass Right Arm   SAVORY DILATION N/A 10/24/2013   Procedure: SAVORY DILATION;  Surgeon: Danie Binder, MD;  Location: [facility];  Service: Endoscopy;  Laterality: N/A;   SHOULDER SURGERY Left 2017   TUMOR REMOVAL  1994   Benign from brain at [facility]   UPPER GASTROINTESTINAL ENDOSCOPY  JAN 2011   GASTRIC ULCER-EOS GRANULOMA   UPPER GASTROINTESTINAL ENDOSCOPY  JUN 2011   GASTRIC ULCER- EOS, NO H. PYLORI   UPPER GASTROINTESTINAL ENDOSCOPY  NOV 2011   GASTRIC NODULE, no ulcer- EOS       Vanessa Kick, MD 02/11/21 1218

## 2021-02-24 ENCOUNTER — Telehealth: Payer: Self-pay | Admitting: Family Medicine

## 2021-02-24 NOTE — Telephone Encounter (Signed)
Patient called and stated he needed refills for his meds:  Doxazosin Enalapril Pravastatin Verapamil   CB#  [phone]

## 2021-02-24 NOTE — Telephone Encounter (Signed)
Patient has appointment tomorrow morning to establish care with new PCP. Patient states he has enough medication till his appointment in the morning and doesn't need anything at this time.

## 2021-02-25 ENCOUNTER — Ambulatory Visit (INDEPENDENT_AMBULATORY_CARE_PROVIDER_SITE_OTHER): Payer: Medicare HMO | Admitting: Internal Medicine

## 2021-02-25 ENCOUNTER — Other Ambulatory Visit: Payer: Self-pay

## 2021-02-25 ENCOUNTER — Encounter: Payer: Self-pay | Admitting: Internal Medicine

## 2021-02-25 VITALS — BP 136/74 | HR 63 | Temp 98.3°F | Resp 18 | Ht 64.0 in | Wt 170.0 lb

## 2021-02-25 DIAGNOSIS — K219 Gastro-esophageal reflux disease without esophagitis: Secondary | ICD-10-CM | POA: Diagnosis not present

## 2021-02-25 DIAGNOSIS — F325 Major depressive disorder, single episode, in full remission: Secondary | ICD-10-CM | POA: Diagnosis not present

## 2021-02-25 DIAGNOSIS — Z7689 Persons encountering health services in other specified circumstances: Secondary | ICD-10-CM | POA: Diagnosis not present

## 2021-02-25 DIAGNOSIS — H6501 Acute serous otitis media, right ear: Secondary | ICD-10-CM

## 2021-02-25 DIAGNOSIS — Z23 Encounter for immunization: Secondary | ICD-10-CM

## 2021-02-25 DIAGNOSIS — I10 Essential (primary) hypertension: Secondary | ICD-10-CM | POA: Diagnosis not present

## 2021-02-25 DIAGNOSIS — E782 Mixed hyperlipidemia: Secondary | ICD-10-CM | POA: Diagnosis not present

## 2021-02-25 DIAGNOSIS — R011 Cardiac murmur, unspecified: Secondary | ICD-10-CM | POA: Diagnosis not present

## 2021-02-25 DIAGNOSIS — N400 Enlarged prostate without lower urinary tract symptoms: Secondary | ICD-10-CM | POA: Diagnosis not present

## 2021-02-25 DIAGNOSIS — E785 Hyperlipidemia, unspecified: Secondary | ICD-10-CM

## 2021-02-25 MED ORDER — OFLOXACIN 0.3 % OT SOLN: 5.0000 [drp] | Freq: Every day | OTIC | 0 refills | Status: DC

## 2021-02-25 NOTE — Progress Notes (Addendum)
New Patient Office Visit  Subjective:  Patient ID: Adam [name], male    DOB: 1950/06/17  Age: 70 y.o. MRN: [id number]  CC:  Chief Complaint  Patient presents with   New Patient (Initial Visit)    New patient was seeing dr Wolfgang Phoenix has had trouble out of inner ear was seen at urgent care on 02-11-21 was given prednisone and ear drops     HPI Adam [name] is a 70 y.o. male with PMH of HTN, GERD, eosinophilic gastritis, BPH, HLD and depression who presents for establishing care. He is a former patient of Dr Luking/Dr Lovena Le.  HTN: BP is well-controlled. Takes medications - Enalapril and Verapamil regularly. Patient denies headache, dizziness, chest pain, dyspnea or palpitations.  GERD: He takes Pantoprazole for it. He denies any dysphagia or odynophagia currently.  BPH: He takes Doxazosin. Denies any dysuria or hematuria.  Depression: He takes Paxil for it.  He denies any anhedonia, anxiety, SI or HI currently.  He wants to stop taking Paxil.  He is willing to taper it slowly and then discontinue it.  He complains of right ear pain for last weeks.  He went to Urgent care for it.  He was given neomycin polymyxin eardrops, but he still has left ear pain.  Denies any ear discharge currently.  He had dizziness initially, which has improved now.  He denies any fever, chills, nasal congestion, sore throat or sinus pain.  He has had 2 doses of COVID-vaccine. He received flu vaccine in the office today.   Past Medical History:  Diagnosis Date   Allergy    BMI 31.0-31.9,adult JUNE 2011 180 LBS    Chronic back pain    Depression    Eosinophilic granuloma ([place]) JAN 2011 GASTRIC, followed by Dr. Eugenia Pancoast   EGD JAN 2011, JUN 2011, NOV 2011   Gastric ulcer 08/31/2012   GERD (gastroesophageal reflux disease)    HTN (hypertension)    Hyperlipemia    IBS (irritable bowel syndrome)    Prostate hypertrophy     Past Surgical History:  Procedure Laterality Date   BACK SURGERY      COLONOSCOPY  JAN 2011 COMPLETE   hypoTN and bradycardia w/ Demerol ,Phenergan and Versed, [place] TICS, SML IH   COLONOSCOPY  2003   NUR-normal   COLONOSCOPY  06/21/09   Fields-sigmoid diverticulosis, sm int hemorrhoids   COLONOSCOPY N/A 10/24/2013   Normal mucosa in the terminal ileum Moderate diverticulosis noted in the sigmoid colonModerate sized internal hemorrhoids. negative random colon bx.. next TCS 10/2023   Cyst Removed     from Left leg and knee   ESOPHAGOGASTRODUODENOSCOPY  05/20/2012   SLF: MILD ESOPHAGITIS.NO BARRETT'S/Multiple ulcers ranging between 3-5 mm/MILD Duodenal inflammation was found in the duodenal bulb   ESOPHAGOGASTRODUODENOSCOPY N/A 09/05/2012   SLF: MILD Non-erosive gastritis (inflammation) in the gastric antrum. Biopsies benign, no H. pylori.   ESOPHAGOGASTRODUODENOSCOPY N/A 10/24/2013   OYD:XAJOINOM DISTAL ESOPHAGEAL WEBSmall hiatal herniaMILD Non-erosive gastritis. SB bx benign. gastric bx, minimal inflammation.    GANGLION CYST EXCISION     from L wrist and pins placed for Fx involving L thumb   HAND SURGERY  at the age of 13   Brother accidentally cut it   MALONEY DILATION N/A 10/24/2013   Procedure: Venia Minks DILATION;  Surgeon: Danie Binder, MD;  [facility];  Service: Endoscopy;  Laterality: N/A;   MASS EXCISION  04/29/2012   Procedure: EXCISION MASS;  Surgeon: Donato Heinz, MD;  Location: [facility];  Service: General;  Laterality: Right;  Excision of Vascular Mass Right Arm   SAVORY DILATION N/A 10/24/2013   Procedure: SAVORY DILATION;  Surgeon: Danie Binder, MD;  [facility];  Service: Endoscopy;  Laterality: N/A;   SHOULDER SURGERY Left 2017   TUMOR REMOVAL  1994   Benign from brain at [place]   GASTRIC ULCER-EOS GRANULOMA   UPPER GASTROINTESTINAL ENDOSCOPY  JUN 2011   GASTRIC ULCER- EOS, NO H. PYLORI   UPPER GASTROINTESTINAL ENDOSCOPY  NOV 2011   GASTRIC NODULE, no ulcer- EOS     Family History  Problem Relation Age of Onset   Hypertension Mother    Diabetes Mother    Hyperlipidemia Mother    Thyroid disease Mother    Hypertension Father    Diabetes Father    Stroke Father    Colon cancer Maternal Grandmother        OVER 51    Social History   Socioeconomic History   Marital status: Married    Spouse name: [name]   Number of children: 1   Years of education: 12   Highest education level: Not on file  Occupational History   Occupation: disabled  Tobacco Use   Smoking status: Former    Packs/day: 0.25    Years: 15.00    Pack years: 3.75    Types: Cigars, Cigarettes    Quit date: 04/29/2002    Years since quitting: 18.8   Smokeless tobacco: Never  Substance and Sexual Activity   Alcohol use: Yes    Comment: mixed drink 1-2 per mo   Drug use: No   Sexual activity: Yes    Birth control/protection: None  Other Topics Concern   Not on file  Social History Narrative   Lives w/ wife   Caffeine use: 1 cup coffee every morning   Social Determinants of Health   Financial Resource Strain: Not on file  Food Insecurity: Not on file  Transportation Needs: Not on file  Physical Activity: Not on file  Stress: Not on file  Social Connections: Not on file  Intimate Partner Violence: Not on file    ROS Review of Systems  Constitutional:  Negative for chills and fever.  HENT:  Positive for ear pain (Left). Negative for congestion and sore throat.   Eyes:  Negative for pain and discharge.  Respiratory:  Negative for cough and shortness of breath.   Cardiovascular:  Negative for chest pain and palpitations.  Gastrointestinal:  Negative for constipation, diarrhea, nausea and vomiting.  Endocrine: Negative for polydipsia and polyuria.  Genitourinary:  Negative for dysuria and hematuria.  Musculoskeletal:  Negative for neck pain and neck stiffness.  Skin:  Negative for rash.  Neurological:  Negative for dizziness, weakness, numbness and  headaches.  Psychiatric/Behavioral:  Negative for agitation and behavioral problems.    Objective:   Today's Vitals: BP 136/74 (BP Location: Left Arm, Patient Position: Sitting, Cuff Size: Normal)   Pulse 63   Temp 98.3 F (36.8 C) (Oral)   Resp 18   Ht 5\' 4"  (1.626 m)   Wt 170 lb 0.6 oz (77.1 kg)   SpO2 96%   BMI 29.19 kg/m   Physical Exam Vitals reviewed.  Constitutional:      General: He is not in acute distress.    Appearance: He is not diaphoretic.  HENT:     Head: Normocephalic and atraumatic.  Right Ear: Tympanic membrane and ear canal normal.     Left Ear: A middle ear effusion is present. Tympanic membrane is erythematous.     Nose: Nose normal.     Mouth/Throat:     Mouth: Mucous membranes are moist.  Eyes:     General: No scleral icterus.    Extraocular Movements: Extraocular movements intact.     Pupils: Pupils are equal, round, and reactive to light.  Cardiovascular:     Rate and Rhythm: Normal rate and regular rhythm.     Pulses: Normal pulses.     Heart sounds: Murmur heard.  Pulmonary:     Breath sounds: Normal breath sounds. No wheezing or rales.  Abdominal:     Palpations: Abdomen is soft.     Tenderness: There is no abdominal tenderness.  Musculoskeletal:     Cervical back: Neck supple. No tenderness.     Right lower leg: No edema.     Left lower leg: No edema.  Skin:    General: Skin is warm.     Findings: No rash.  Neurological:     General: No focal deficit present.     Mental Status: He is alert and oriented to person, place, and time.  Psychiatric:        Mood and Affect: Mood normal.        Behavior: Behavior normal.    Assessment & Plan:   Problem List Items Addressed This Visit       Encounter to establish care    -  Primary Care established History and medications reviewed with the patient  Cardiovascular and Mediastinum   Essential hypertension, benign    BP Readings from Last 1 Encounters:  02/25/21 136/74   Well-controlled with Verapamil and Enalapril Counseled for compliance with the medications Advised DASH diet and moderate exercise/walking, at least 150 mins/week      Relevant Orders   ECHOCARDIOGRAM COMPLETE     Digestive   GERD (gastroesophageal reflux disease)    On Pantoprazole        Genitourinary   BPH (benign prostatic hyperplasia)    On Doxazosin        Other   Hyperlipidemia    On Pravastatin      Depression    On Paxil Wants to discontinue it as he denies any anhedonia or anxiety symptoms currently Will taper slowly and discontinue - advised to take Paxil 10 mg for 2 weeks and then discontinue      Systolic murmur    Check Echo Asymptomatic currently      Relevant Orders   ECHOCARDIOGRAM COMPLETE   Other Visit Diagnoses        Non-recurrent acute serous otitis media of right ear       Relevant Medications   ofloxacin (FLOXIN OTIC) 0.3 % OTIC solution   Need for immunization against influenza       Relevant Orders   Flu Vaccine QUAD High Dose(Fluad) (Completed)       Outpatient Encounter Medications as of 02/25/2021  Medication Sig   doxazosin (CARDURA) 8 MG tablet TAKE 1 TABLET BY MOUTH EVERYDAY AT BEDTIME   enalapril (VASOTEC) 20 MG tablet TAKE 1 TABLET EVERY DAY   loratadine (CLARITIN) 10 MG tablet Take 10 mg by mouth daily as needed for allergies.    ofloxacin (FLOXIN OTIC) 0.3 % OTIC solution Place 5 drops into the right ear daily.   pantoprazole (PROTONIX) 40 MG tablet TAKE 1 TABLET TWICE DAILY  PARoxetine (PAXIL) 20 MG tablet Take 1 tablet (20 mg total) by mouth daily.   pravastatin (PRAVACHOL) 20 MG tablet TAKE 1 TABLET AT BEDTIME (NEED MD APPOINTMENT)   verapamil (CALAN-SR) 180 MG CR tablet Take 1 tablet (180 mg total) by mouth 2 (two) times daily.   [DISCONTINUED] neomycin-polymyxin-hydrocortisone (CORTISPORIN) 3.5-10000-1 OTIC suspension Place 4 drops into the right ear 3 (three) times daily. (Patient not taking: Reported on  02/25/2021)   [DISCONTINUED] predniSONE (DELTASONE) 20 MG tablet Take 2 tablets (40 mg total) by mouth daily. (Patient not taking: Reported on 02/25/2021)   No facility-administered encounter medications on file as of 02/25/2021.    Follow-up: Return in about 5 months (around 07/28/2021) for HTN and HLD.   Lindell Spar, MD

## 2021-02-25 NOTE — Patient Instructions (Signed)
Please start using Ofloxacin ear drops for ear infection.  Please start taking Paxil 1/2 tablet once daily for 2 weeks and then discontinue.  Please continue to take other medications as prescribed.  Continue to follow low salt diet and perform moderate exercise/walking at least 150 mins/week.  You are being scheduled to get Echo done at [facility].

## 2021-02-26 DIAGNOSIS — N400 Enlarged prostate without lower urinary tract symptoms: Secondary | ICD-10-CM | POA: Insufficient documentation

## 2021-02-26 DIAGNOSIS — R011 Cardiac murmur, unspecified: Secondary | ICD-10-CM | POA: Insufficient documentation

## 2021-02-26 NOTE — Assessment & Plan Note (Signed)
On Doxazosin

## 2021-02-26 NOTE — Assessment & Plan Note (Signed)
On Pantoprazole 

## 2021-02-26 NOTE — Assessment & Plan Note (Signed)
Check Echo Asymptomatic currently

## 2021-02-26 NOTE — Assessment & Plan Note (Addendum)
On Pravastatin 

## 2021-02-26 NOTE — Assessment & Plan Note (Signed)
BP Readings from Last 1 Encounters:  02/25/21 136/74   Well-controlled with Verapamil and Enalapril Counseled for compliance with the medications Advised DASH diet and moderate exercise/walking, at least 150 mins/week

## 2021-02-26 NOTE — Assessment & Plan Note (Addendum)
On Paxil Wants to discontinue it as he denies any anhedonia or anxiety symptoms currently Will taper slowly and discontinue - advised to take Paxil 10 mg for 2 weeks and then discontinue

## 2021-02-28 ENCOUNTER — Telehealth: Payer: Self-pay

## 2021-02-28 NOTE — Telephone Encounter (Signed)
Checking percert on the following patient for testing scheduled at [facility].    ECHO   03/05/2021

## 2021-03-05 ENCOUNTER — Other Ambulatory Visit: Payer: Self-pay

## 2021-03-05 ENCOUNTER — Ambulatory Visit (HOSPITAL_COMMUNITY)
Admission: RE | Admit: 2021-03-05 | Discharge: 2021-03-05 | Disposition: A | Discharge status: Home / Self Care | Payer: Medicare HMO | Source: Ambulatory Visit | Attending: Internal Medicine | Admitting: Internal Medicine

## 2021-03-05 DIAGNOSIS — R011 Cardiac murmur, unspecified: Secondary | ICD-10-CM | POA: Insufficient documentation

## 2021-03-05 DIAGNOSIS — I10 Essential (primary) hypertension: Secondary | ICD-10-CM | POA: Insufficient documentation

## 2021-03-05 LAB — ECHOCARDIOGRAM COMPLETE
AR max vel: 1.44 cm2
AV Area VTI: 1.63 cm2
AV Area mean vel: 1.42 cm2
AV Mean grad: 23 mmHg
AV Peak grad: 45.3 mmHg
Ao pk vel: 3.37 m/s
Area-P 1/2: 2.76 cm2
P 1/2 time: 630 msec
S' Lateral: 2.5 cm

## 2021-03-05 NOTE — Progress Notes (Signed)
*  PRELIMINARY RESULTS* Echocardiogram 2D Echocardiogram has been performed.  Adam [name] 03/05/2021, 2:48 PM

## 2021-03-06 ENCOUNTER — Telehealth: Payer: Self-pay | Admitting: Internal Medicine

## 2021-03-06 ENCOUNTER — Other Ambulatory Visit: Payer: Self-pay | Admitting: *Deleted

## 2021-03-06 DIAGNOSIS — I10 Essential (primary) hypertension: Secondary | ICD-10-CM

## 2021-03-06 DIAGNOSIS — E785 Hyperlipidemia, unspecified: Secondary | ICD-10-CM

## 2021-03-06 DIAGNOSIS — F320 Major depressive disorder, single episode, mild: Secondary | ICD-10-CM

## 2021-03-06 DIAGNOSIS — K219 Gastro-esophageal reflux disease without esophagitis: Secondary | ICD-10-CM

## 2021-03-06 MED ORDER — PANTOPRAZOLE SODIUM 40 MG PO TBEC: DELAYED_RELEASE_TABLET | ORAL | 1 refills | Status: DC

## 2021-03-06 MED ORDER — PRAVASTATIN SODIUM 20 MG PO TABS: ORAL_TABLET | ORAL | 1 refills | Status: DC

## 2021-03-06 MED ORDER — VERAPAMIL HCL ER 180 MG PO TBCR
180.0000 mg | EXTENDED_RELEASE_TABLET | Freq: Two times a day (BID) | ORAL | 1 refills | Status: DC
Start: 2021-03-06 — End: 2021-08-08

## 2021-03-06 MED ORDER — PAROXETINE HCL 20 MG PO TABS: 20.0000 mg | ORAL_TABLET | Freq: Every day | ORAL | 1 refills | Status: DC

## 2021-03-06 MED ORDER — DOXAZOSIN MESYLATE 8 MG PO TABS: ORAL_TABLET | ORAL | 1 refills | Status: DC

## 2021-03-06 MED ORDER — ENALAPRIL MALEATE 20 MG PO TABS: ORAL_TABLET | ORAL | 1 refills | Status: DC

## 2021-03-06 NOTE — Telephone Encounter (Signed)
Pt medication sent to [facility] as that is the company that took over [employer]

## 2021-03-06 NOTE — Telephone Encounter (Signed)
Pts spouse is calling   Pt is out of all medicine, please send all to [facility]

## 2021-05-06 ENCOUNTER — Other Ambulatory Visit: Payer: Self-pay

## 2021-05-06 NOTE — Progress Notes (Signed)
Subjective:   Adam [name] is a 70 y.o. male who presents for Medicare Annual/Subsequent preventive examination.  Review of Systems           Objective:    There were no vitals filed for this visit. There is no height or weight on file to calculate BMI.  Advanced Directives 04/15/2019 05/11/2016 06/10/2015 11/22/2014 06/17/2014 10/24/2013 09/05/2012  Does Patient Have a Medical Advance Directive? No No No Yes No Patient does not have advance directive;Patient would not like information Patient does not have advance directive  Type of Advance Directive - - - [place]  Would patient like information on creating a medical advance directive? - No - Patient declined - - No - patient declined information - -  Pre-existing out of facility DNR order (yellow form or pink MOST form) - - - - - No -    Current Medications (verified) Outpatient Encounter Medications as of 05/06/2021  Medication Sig   doxazosin (CARDURA) 8 MG tablet TAKE 1 TABLET BY MOUTH EVERYDAY AT BEDTIME   enalapril (VASOTEC) 20 MG tablet TAKE 1 TABLET EVERY DAY   loratadine (CLARITIN) 10 MG tablet Take 10 mg by mouth daily as needed for allergies.    ofloxacin (FLOXIN OTIC) 0.3 % OTIC solution Place 5 drops into the right ear daily.   pantoprazole (PROTONIX) 40 MG tablet TAKE 1 TABLET TWICE DAILY   PARoxetine (PAXIL) 20 MG tablet Take 1 tablet (20 mg total) by mouth daily.   pravastatin (PRAVACHOL) 20 MG tablet TAKE 1 TABLET AT BEDTIME (NEED MD APPOINTMENT)   verapamil (CALAN-SR) 180 MG CR tablet Take 1 tablet (180 mg total) by mouth 2 (two) times daily.   No facility-administered encounter medications on file as of 05/06/2021.    Allergies (verified) Sulfonamide derivatives   History: Past Medical History:  Diagnosis Date   Allergy    BMI 31.0-31.9,adult JUNE 2011 180 LBS    Chronic back pain    Depression    Eosinophilic granuloma ([place]) JAN 2011 GASTRIC, followed by Dr. Eugenia Pancoast    EGD JAN 2011, JUN 2011, NOV 2011   Gastric ulcer 08/31/2012   GERD (gastroesophageal reflux disease)    HTN (hypertension)    Hyperlipemia    IBS (irritable bowel syndrome)    Prostate hypertrophy    Past Surgical History:  Procedure Laterality Date   BACK SURGERY     COLONOSCOPY  JAN 2011 COMPLETE   hypoTN and bradycardia w/ Demerol ,Phenergan and Versed, [place] TICS, SML IH   COLONOSCOPY  2003   NUR-normal   COLONOSCOPY  06/21/09   Fields-sigmoid diverticulosis, sm int hemorrhoids   COLONOSCOPY N/A 10/24/2013   Normal mucosa in the terminal ileum Moderate diverticulosis noted in the sigmoid colonModerate sized internal hemorrhoids. negative random colon bx.. next TCS 10/2023   Cyst Removed     from Left leg and knee   ESOPHAGOGASTRODUODENOSCOPY  05/20/2012   SLF: MILD ESOPHAGITIS.NO BARRETT'S/Multiple ulcers ranging between 3-5 mm/MILD Duodenal inflammation was found in the duodenal bulb   ESOPHAGOGASTRODUODENOSCOPY N/A 09/05/2012   SLF: MILD Non-erosive gastritis (inflammation) in the gastric antrum. Biopsies benign, no H. pylori.   ESOPHAGOGASTRODUODENOSCOPY N/A 10/24/2013   RSW:NIOEVOJJ DISTAL ESOPHAGEAL WEBSmall hiatal herniaMILD Non-erosive gastritis. SB bx benign. gastric bx, minimal inflammation.    GANGLION CYST EXCISION     from L wrist and pins placed for Fx involving L thumb   HAND SURGERY  at the age of 34   Brother  accidentally cut it   MALONEY DILATION N/A 10/24/2013   Procedure: MALONEY DILATION;  Surgeon: Danie Binder, MD;  Location: [facility];  Service: Endoscopy;  Laterality: N/A;   MASS EXCISION  04/29/2012   Procedure: EXCISION MASS;  Surgeon: Donato Heinz, MD;  Location: [facility];  Service: General;  Laterality: Right;  Excision of Vascular Mass Right Arm   SAVORY DILATION N/A 10/24/2013   Procedure: SAVORY DILATION;  Surgeon: Danie Binder, MD;  Location: [facility];  Service: Endoscopy;  Laterality: N/A;   SHOULDER SURGERY Left 2017   TUMOR REMOVAL  1994    Benign from brain at [place]   GASTRIC ULCER-EOS GRANULOMA   UPPER GASTROINTESTINAL ENDOSCOPY  JUN 2011   GASTRIC ULCER- EOS, NO H. PYLORI   UPPER GASTROINTESTINAL ENDOSCOPY  NOV 2011   GASTRIC NODULE, no ulcer- EOS   Family History  Problem Relation Age of Onset   Hypertension Mother    Diabetes Mother    Hyperlipidemia Mother    Thyroid disease Mother    Hypertension Father    Diabetes Father    Stroke Father    Colon cancer Maternal Grandmother        OVER 52   Social History   Socioeconomic History   Marital status: Married    Spouse name: [name]   Number of children: 1   Years of education: 12   Highest education level: Not on file  Occupational History   Occupation: disabled  Tobacco Use   Smoking status: Former    Packs/day: 0.25    Years: 15.00    Pack years: 3.75    Types: Cigars, Cigarettes    Quit date: 04/29/2002    Years since quitting: 19.0   Smokeless tobacco: Never  Substance and Sexual Activity   Alcohol use: Yes    Comment: mixed drink 1-2 per mo   Drug use: No   Sexual activity: Yes    Birth control/protection: None  Other Topics Concern   Not on file  Social History Narrative   Lives w/ wife   Caffeine use: 1 cup coffee every morning   Social Determinants of Health   Financial Resource Strain: Not on file  Food Insecurity: Not on file  Transportation Needs: Not on file  Physical Activity: Not on file  Stress: Not on file  Social Connections: Not on file    Tobacco Counseling Counseling given: Not Answered   Clinical Intake:                 Diabetic?         Activities of Daily Living In your present state of health, do you have any difficulty performing the following activities: 02/25/2021  Hearing? N  Vision? N  Difficulty concentrating or making decisions? N  Walking or climbing stairs? N  Dressing or bathing? N  Doing errands, shopping? N  Some  recent data might be hidden    Patient Care Team: Lindell Spar, MD as PCP - General (Internal Medicine) Danie Binder, MD (Inactive) (Gastroenterology)  Indicate any recent Medical Services you may have received from other than [facility] providers in the past year (date may be approximate).     Assessment:   This is a routine wellness examination for [place].  Hearing/Vision screen No results found.  Dietary issues and exercise activities discussed:     Goals Addressed   None   Depression Screen  PHQ 2/9 Scores 02/25/2021 12/02/2020 04/30/2020 08/22/2019 02/16/2018 01/20/2017 01/20/2017  PHQ - 2 Score 0 0 0 3 1 2  0  PHQ- 9 Score 0 - - 4 - 4 -    Fall Risk Fall Risk  02/25/2021 12/02/2020 04/30/2020 10/16/2019 02/16/2018  Falls in the past year? 0 0 0 0 No  Number falls in past yr: 0 - - 0 -  Injury with Fall? 0 - - 0 -  Risk for fall due to : No Fall Risks - - - -  Follow up Falls evaluation completed Falls evaluation completed Falls evaluation completed Falls evaluation completed -    FALL RISK PREVENTION PERTAINING TO THE HOME:  Any stairs in or around the home?    If so, are there any without handrails?    Home free of loose throw rugs in walkways, pet beds, electrical cords, etc?  Adequate lighting in your home to reduce risk of falls?   ASSISTIVE DEVICES UTILIZED TO PREVENT FALLS:  Life alert?  Use of a cane, walker or w/c?  Grab bars in the bathroom?  Shower chair or bench in shower?  Elevated toilet seat or a handicapped toilet?   TIMED UP AND GO:  Was the test performed? .  Length of time to ambulate 10 feet:  sec.     Cognitive Function:        Immunizations Immunization History  Administered Date(s) Administered   Fluad Quad(high Dose 65+) 04/30/2020, 02/25/2021   Influenza Split 03/28/2013   Influenza,inj,Quad PF,6+ Mos 03/19/2014, 02/26/2015, 03/10/2016, 05/06/2017, 02/16/2018, 02/21/2019   Influenza-Unspecified 05/07/2012   Moderna  Sars-Covid-2 Vaccination 07/16/2019, 08/16/2019   Pneumococcal Conjugate-13 02/16/2018   Pneumococcal Polysaccharide-23 03/19/2014   Td 06/07/2010   Tdap 09/22/2019   Zoster Recombinat (Shingrix) 03/20/2019            Qualifies for Shingles Vaccine?   Zostavax completed     Screening Tests Health Maintenance  Topic Date Due   Hepatitis C Screening  Never done   Zoster Vaccines- Shingrix (2 of 2) 05/15/2019   COLONOSCOPY (Pts 45-51yrs Insurance coverage will need to be confirmed)  10/25/2023   TETANUS/TDAP  09/21/2029   Pneumonia Vaccine 51+ Years old  Completed   INFLUENZA VACCINE  Completed   HPV VACCINES  Aged Out   COVID-19 Vaccine  Discontinued    Health Maintenance  Health Maintenance Due  Topic Date Due   Hepatitis C Screening  Never done   Zoster Vaccines- Shingrix (2 of 2) 05/15/2019      Lung Cancer Screening: (Low Dose CT Chest recommended if Age 15-80 years, 30 pack-year currently smoking OR have quit w/in 15years.)  qualify.   Lung Cancer Screening Referral:   Additional Screening:  Hepatitis C Screening:  qualify; Completed   Vision Screening: Recommended annual ophthalmology exams for early detection of glaucoma and other disorders of the eye. Is the patient up to date with their annual eye exam?   Who is the provider or what is the name of the office in which the patient attends annual eye exams?  If pt is not established with a provider, would they like to be referred to a provider to establish care? .   Dental Screening: Recommended annual dental exams for proper oral hygiene  Community Resource Referral / Chronic Care Management: CRR required this visit?    CCM required this visit?       Plan:     I have personally reviewed and noted the following in the  patient's chart:   Medical and social history Use of alcohol, tobacco or illicit drugs  Current medications and supplements including opioid prescriptions.  Functional ability  and status Nutritional status Physical activity Advanced directives List of other physicians Hospitalizations, surgeries, and ER visits in previous 12 months Vitals Screenings to include cognitive, depression, and falls Referrals and appointments  In addition, I have reviewed and discussed with patient certain preventive protocols, quality metrics, and best practice recommendations. A written personalized care plan for preventive services as well as general preventive health recommendations were provided to patient.     Quentin Angst, [place]   05/06/2021   Nurse Notes: ERROR

## 2021-05-07 ENCOUNTER — Other Ambulatory Visit: Payer: Self-pay

## 2021-05-07 ENCOUNTER — Ambulatory Visit (INDEPENDENT_AMBULATORY_CARE_PROVIDER_SITE_OTHER): Payer: Medicare HMO

## 2021-05-07 DIAGNOSIS — Z0000 Encounter for general adult medical examination without abnormal findings: Secondary | ICD-10-CM

## 2021-05-07 NOTE — Progress Notes (Signed)
Subjective:   Adam [name] is a 70 y.o. male who presents for Medicare Annual/Subsequent preventive examination.  I connected with  Adam [name] on 05/07/21 by a audio enabled telemedicine application and verified that I am speaking with the correct person using two identifiers.  Patient Location: Home  Provider Location: Office/Clinic  I discussed the limitations of evaluation and management by telemedicine. The patient expressed understanding and agreed to proceed.   Review of Systems     Adam [name] , Thank you for taking time to come for your Medicare Wellness Visit. I appreciate your ongoing commitment to your health goals. Please review the following plan we discussed and let me know if I can assist you in the future.   These are the goals we discussed:  Goals      Patient Stated     Keep living a healthy life.        This is a list of the screening recommended for you and due dates:  Health Maintenance  Topic Date Due   Hepatitis C Screening: USPSTF Recommendation to screen - Ages 75-79 yo.  Never done   Zoster (Shingles) Vaccine (2 of 2) 05/15/2019   Colon Cancer Screening  10/25/2023   Tetanus Vaccine  09/21/2029   Pneumonia Vaccine  Completed   Flu Shot  Completed   HPV Vaccine  Aged Out   COVID-19 Vaccine  Discontinued    Cardiac Risk Factors include: none     Objective:    Today's Vitals   05/07/21 1327  PainSc: 0-No pain   There is no height or weight on file to calculate BMI.  Advanced Directives 05/07/2021 04/15/2019 05/11/2016 06/10/2015 11/22/2014 06/17/2014 10/24/2013  Does Patient Have a Medical Advance Directive? No No No No Yes No Patient does not have advance directive;Patient would not like information  Type of Advance Directive - - - - [place]  Would patient like information on creating a medical advance directive? Yes (ED - Information included in AVS) - No - Patient declined - - No - patient declined  information -  Pre-existing out of facility DNR order (yellow form or pink MOST form) - - - - - - No    Current Medications (verified) Outpatient Encounter Medications as of 05/07/2021  Medication Sig   doxazosin (CARDURA) 8 MG tablet TAKE 1 TABLET BY MOUTH EVERYDAY AT BEDTIME   enalapril (VASOTEC) 20 MG tablet TAKE 1 TABLET EVERY DAY   loratadine (CLARITIN) 10 MG tablet Take 10 mg by mouth daily as needed for allergies.    ofloxacin (FLOXIN OTIC) 0.3 % OTIC solution Place 5 drops into the right ear daily.   pantoprazole (PROTONIX) 40 MG tablet TAKE 1 TABLET TWICE DAILY   PARoxetine (PAXIL) 20 MG tablet Take 1 tablet (20 mg total) by mouth daily.   pravastatin (PRAVACHOL) 20 MG tablet TAKE 1 TABLET AT BEDTIME (NEED MD APPOINTMENT)   verapamil (CALAN-SR) 180 MG CR tablet Take 1 tablet (180 mg total) by mouth 2 (two) times daily.   No facility-administered encounter medications on file as of 05/07/2021.    Allergies (verified) Sulfonamide derivatives   History: Past Medical History:  Diagnosis Date   Allergy    BMI 31.0-31.9,adult JUNE 2011 180 LBS    Chronic back pain    Depression    Eosinophilic granuloma ([place]) JAN 2011 GASTRIC, followed by Dr. Eugenia Pancoast   EGD JAN 2011, JUN 2011, NOV 2011   Gastric ulcer 08/31/2012  GERD (gastroesophageal reflux disease)    HTN (hypertension)    Hyperlipemia    IBS (irritable bowel syndrome)    Prostate hypertrophy    Past Surgical History:  Procedure Laterality Date   BACK SURGERY     COLONOSCOPY  JAN 2011 COMPLETE   hypoTN and bradycardia w/ Demerol ,Phenergan and Versed, [place] TICS, SML IH   COLONOSCOPY  2003   NUR-normal   COLONOSCOPY  06/21/09   Fields-sigmoid diverticulosis, sm int hemorrhoids   COLONOSCOPY N/A 10/24/2013   Normal mucosa in the terminal ileum Moderate diverticulosis noted in the sigmoid colonModerate sized internal hemorrhoids. negative random colon bx.. next TCS 10/2023   Cyst Removed     from Left leg and knee    ESOPHAGOGASTRODUODENOSCOPY  05/20/2012   SLF: MILD ESOPHAGITIS.NO BARRETT'S/Multiple ulcers ranging between 3-5 mm/MILD Duodenal inflammation was found in the duodenal bulb   ESOPHAGOGASTRODUODENOSCOPY N/A 09/05/2012   SLF: MILD Non-erosive gastritis (inflammation) in the gastric antrum. Biopsies benign, no H. pylori.   ESOPHAGOGASTRODUODENOSCOPY N/A 10/24/2013   ATF:TDDUKGUR DISTAL ESOPHAGEAL WEBSmall hiatal herniaMILD Non-erosive gastritis. SB bx benign. gastric bx, minimal inflammation.    GANGLION CYST EXCISION     from L wrist and pins placed for Fx involving L thumb   HAND SURGERY  at the age of 89   Brother accidentally cut it   MALONEY DILATION N/A 10/24/2013   Procedure: Venia Minks DILATION;  Surgeon: Danie Binder, MD;  [facility];  Service: Endoscopy;  Laterality: N/A;   MASS EXCISION  04/29/2012   Procedure: EXCISION MASS;  Surgeon: Donato Heinz, MD;  Location: [facility];  Service: General;  Laterality: Right;  Excision of Vascular Mass Right Arm   SAVORY DILATION N/A 10/24/2013   Procedure: SAVORY DILATION;  Surgeon: Danie Binder, MD;  Location: [facility];  Service: Endoscopy;  Laterality: N/A;   SHOULDER SURGERY Left 2017   TUMOR REMOVAL  1994   Benign from brain at [place]   GASTRIC ULCER-EOS GRANULOMA   UPPER GASTROINTESTINAL ENDOSCOPY  JUN 2011   GASTRIC ULCER- EOS, NO H. PYLORI   UPPER GASTROINTESTINAL ENDOSCOPY  NOV 2011   GASTRIC NODULE, no ulcer- EOS   Family History  Problem Relation Age of Onset   Hypertension Mother    Diabetes Mother    Hyperlipidemia Mother    Thyroid disease Mother    Hypertension Father    Diabetes Father    Stroke Father    Colon cancer Maternal Grandmother        OVER 58   Social History   Socioeconomic History   Marital status: Married    Spouse name: [name]   Number of children: 1   Years of education: 12   Highest education level: Not on file  Occupational  History   Occupation: disabled  Tobacco Use   Smoking status: Former    Packs/day: 0.25    Years: 15.00    Pack years: 3.75    Types: Cigars, Cigarettes    Quit date: 04/29/2002    Years since quitting: 19.0   Smokeless tobacco: Never  Substance and Sexual Activity   Alcohol use: Not Currently   Drug use: No   Sexual activity: Yes    Birth control/protection: None  Other Topics Concern   Not on file  Social History Narrative   Lives w/ wife   Caffeine use: 1 cup coffee every morning   Social Determinants of  Health   Financial Resource Strain: Low Risk    Difficulty of Paying Living Expenses: Not hard at all  Food Insecurity: No Food Insecurity   Worried About [place] in the Last Year: Never true   Ran Out of Food in the Last Year: Never true  Transportation Needs: No Transportation Needs   Lack of Transportation (Medical): No   Lack of Transportation (Non-Medical): No  Physical Activity: Sufficiently Active   Days of Exercise per Week: 5 days   Minutes of Exercise per Session: 30 min  Stress: No Stress Concern Present   Feeling of Stress : Not at all  Social Connections: Moderately Integrated   Frequency of Communication with Friends and Family: Once a week   Frequency of Social Gatherings with Friends and Family: More than three times a week   Attends Religious Services: More than 4 times per year   Active Member of [employer] or Organizations: No   Attends Music therapist: Never   Marital Status: Married    Tobacco Counseling Counseling given: Not Answered   Clinical Intake:  Pre-visit preparation completed: Yes  Pain : No/denies pain Pain Score: 0-No pain     BMI - recorded: 29.17 Nutritional Status: BMI 25 -29 Overweight Nutritional Risks: None Diabetes: No  How often do you need to have someone help you when you read instructions, pamphlets, or other written materials from your doctor or pharmacy?: 1 - Never What is the last  grade level you completed in school?: 12  Diabetic?No  Interpreter Needed?: No      Activities of Daily Living In your present state of health, do you have any difficulty performing the following activities: 05/07/2021 02/25/2021  Hearing? N N  Vision? Y N  Difficulty concentrating or making decisions? N N  Walking or climbing stairs? N N  Dressing or bathing? N N  Doing errands, shopping? N N  Preparing Food and eating ? N -  Using the Toilet? N -  In the past six months, have you accidently leaked urine? N -  Do you have problems with loss of bowel control? N -  Managing your Medications? N -  Managing your Finances? N -  Housekeeping or managing your Housekeeping? N -  Some recent data might be hidden    Patient Care Team: Lindell Spar, MD as PCP - General (Internal Medicine) Danie Binder, MD (Inactive) (Gastroenterology)  Indicate any recent Medical Services you may have received from other than [facility] providers in the past year (date may be approximate).     Assessment:   This is a routine wellness examination for [employer].  Hearing/Vision screen No results found.  Dietary issues and exercise activities discussed: Current Exercise Habits: Home exercise routine, Time (Minutes): 30, Frequency (Times/Week): 5, Weekly Exercise (Minutes/Week): 150, Intensity: Mild, Exercise limited by: None identified   Goals Addressed             This Visit's Progress    Patient Stated       Keep living a healthy life.      Depression Screen PHQ 2/9 Scores 05/07/2021 05/07/2021 02/25/2021 12/02/2020 04/30/2020 08/22/2019 02/16/2018  PHQ - 2 Score 0 0 0 0 0 3 1  PHQ- 9 Score - - 0 - - 4 -    Fall Risk Fall Risk  05/07/2021 02/25/2021 12/02/2020 04/30/2020 10/16/2019  Falls in the past year? 0 0 0 0 0  Number falls in past yr: 0 0 - -  0  Injury with Fall? 0 0 - - 0  Risk for fall due to : No Fall Risks No Fall Risks - - -  Follow up Falls evaluation completed Falls  evaluation completed Falls evaluation completed Falls evaluation completed Falls evaluation completed    [place]:  Any stairs in or around the home? Yes  If so, are there any without handrails? Yes  Home free of loose throw rugs in walkways, pet beds, electrical cords, etc? Yes  Adequate lighting in your home to reduce risk of falls? Yes   ASSISTIVE DEVICES UTILIZED TO PREVENT FALLS:  Life alert? No  Use of a cane, walker or w/c? No  Grab bars in the bathroom? No  Shower chair or bench in shower? No  Elevated toilet seat or a handicapped toilet? No   MMSE - Mini Mental State Exam 05/07/2021  Not completed: Unable to complete     6CIT Screen 05/07/2021  What Year? 0 points  What month? 0 points  What time? 0 points  Count back from 20 0 points  Months in reverse 0 points  Repeat phrase 0 points  Total Score 0    Immunizations Immunization History  Administered Date(s) Administered   Fluad Quad(high Dose 65+) 04/30/2020, 02/25/2021   Influenza Split 03/28/2013   Influenza,inj,Quad PF,6+ Mos 03/19/2014, 02/26/2015, 03/10/2016, 05/06/2017, 02/16/2018, 02/21/2019   Influenza-Unspecified 05/07/2012   Moderna Sars-Covid-2 Vaccination 07/16/2019, 08/16/2019   Pneumococcal Conjugate-13 02/16/2018   Pneumococcal Polysaccharide-23 03/19/2014   Td 06/07/2010   Tdap 09/22/2019   Zoster Recombinat (Shingrix) 03/20/2019    TDAP status: Up to date  Flu Vaccine status: Up to date  Pneumococcal vaccine status: Up to date  Covid-19 vaccine status: Completed vaccines  Qualifies for Shingles Vaccine? Yes   Zostavax completed Yes   Shingrix Completed?: No.    Education has been provided regarding the importance of this vaccine. Patient has been advised to call insurance company to determine out of pocket expense if they have not yet received this vaccine. Advised may also receive vaccine at local pharmacy or Health Dept. Verbalized acceptance and  understanding.  Screening Tests Health Maintenance  Topic Date Due   Hepatitis C Screening  Never done   Zoster Vaccines- Shingrix (2 of 2) 05/15/2019   COLONOSCOPY (Pts 45-32yrs Insurance coverage will need to be confirmed)  10/25/2023   TETANUS/TDAP  09/21/2029   Pneumonia Vaccine 20+ Years old  Completed   INFLUENZA VACCINE  Completed   HPV VACCINES  Aged Out   COVID-19 Vaccine  Discontinued    Health Maintenance  Health Maintenance Due  Topic Date Due   Hepatitis C Screening  Never done   Zoster Vaccines- Shingrix (2 of 2) 05/15/2019    Colorectal cancer screening: Type of screening: Colonoscopy. Completed 10/24/2013. Repeat every 10 years  Lung Cancer Screening: (Low Dose CT Chest recommended if Age 17-80 years, 30 pack-year currently smoking OR have quit w/in 15years.) does not qualify.   Lung Cancer Screening Referral: No  Additional Screening:  Hepatitis C Screening: does qualify; Completed ordered  Vision Screening: Recommended annual ophthalmology exams for early detection of glaucoma and other disorders of the eye. Is the patient up to date with their annual eye exam?  Yes  Who is the provider or what is the name of the office in which the patient attends annual eye exams? Dr Jorja Loa My eye doctor Linna Hoff  If pt is not established with a provider, would they like  to be referred to a provider to establish care? No .   Dental Screening: Recommended annual dental exams for proper oral hygiene  Community Resource Referral / Chronic Care Management: CRR required this visit?  No   CCM required this visit?  No      Plan:     I have personally reviewed and noted the following in the patient's chart:   Medical and social history Use of alcohol, tobacco or illicit drugs  Current medications and supplements including opioid prescriptions. Patient is not currently taking opioid prescriptions. Functional ability and status Nutritional status Physical  activity Advanced directives List of other physicians Hospitalizations, surgeries, and ER visits in previous 12 months Vitals Screenings to include cognitive, depression, and falls Referrals and appointments  In addition, I have reviewed and discussed with patient certain preventive protocols, quality metrics, and best practice recommendations. A written personalized care plan for preventive services as well as general preventive health recommendations were provided to patient.  Mr. Wojtaszek , Thank you for taking time to come for your Medicare Wellness Visit. I appreciate your ongoing commitment to your health goals. Please review the following plan we discussed and let me know if I can assist you in the future.   These are the goals we discussed:  Goals      Patient Stated     Keep living a healthy life.        This is a list of the screening recommended for you and due dates:  Health Maintenance  Topic Date Due   Hepatitis C Screening: USPSTF Recommendation to screen - Ages 3-79 yo.  Never done   Zoster (Shingles) Vaccine (2 of 2) 05/15/2019   Colon Cancer Screening  10/25/2023   Tetanus Vaccine  09/21/2029   Pneumonia Vaccine  Completed   Flu Shot  Completed   HPV Vaccine  Aged Out   COVID-19 Vaccine  Discontinued        Quentin Angst, CMA   05/07/2021   Nurse Notes:

## 2021-05-07 NOTE — Patient Instructions (Signed)
Adam [name] , Thank you for taking time to come for your Medicare Wellness Visit. I appreciate your ongoing commitment to your health goals. Please review the following plan we discussed and let me know if I can assist you in the future.   These are the goals we discussed:  Goals      Patient Stated     Keep living a healthy life.        This is a list of the screening recommended for you and due dates:  Health Maintenance  Topic Date Due   Hepatitis C Screening: USPSTF Recommendation to screen - Ages 75-79 yo.  Never done   Zoster (Shingles) Vaccine (2 of 2) 05/15/2019   Colon Cancer Screening  10/25/2023   Tetanus Vaccine  09/21/2029   Pneumonia Vaccine  Completed   Flu Shot  Completed   HPV Vaccine  Aged Out   COVID-19 Vaccine  Discontinued    Adam [name] , Thank you for taking time to come for your Medicare Wellness Visit. I appreciate your ongoing commitment to your health goals. Please review the following plan we discussed and let me know if I can assist you in the future.   Screening recommendations/referrals: Colonoscopy: Complete  Mammogram: N/A Bone Density: N/A Recommended yearly ophthalmology/optometry visit for glaucoma screening and checkup Recommended yearly dental visit for hygiene and checkup  Vaccinations: Influenza vaccine: Complete Pneumococcal vaccine: Complete Tdap vaccine: Complete Shingles vaccine: Due now     Advanced directives: Mailed patient information  Conditions/risks identified: Hypertension   Next appointment: 1 year    Preventive Care 10 Years and Older, Male Preventive care refers to lifestyle choices and visits with your health care provider that can promote health and wellness. What does preventive care include? A yearly physical exam. This is also called an annual well check. Dental exams once or twice a year. Routine eye exams. Ask your health care provider how often you should have your eyes checked. Personal lifestyle  choices, including: Daily care of your teeth and gums. Regular physical activity. Eating a healthy diet. Avoiding tobacco and drug use. Limiting alcohol use. Practicing safe sex. Taking low-dose aspirin every day. Taking vitamin and mineral supplements as recommended by your health care provider. What happens during an annual well check? The services and screenings done by your health care provider during your annual well check will depend on your age, overall health, lifestyle risk factors, and family history of disease. Counseling  Your health care provider may ask you questions about your: Alcohol use. Tobacco use. Drug use. Emotional well-being. Home and relationship well-being. Sexual activity. Eating habits. History of falls. Memory and ability to understand (cognition). Work and work Statistician. Reproductive health. Screening  You may have the following tests or measurements: Height, weight, and BMI. Blood pressure. Lipid and cholesterol levels. These may be checked every 5 years, or more frequently if you are over 33 years old. Skin check. Lung cancer screening. You may have this screening every year starting at age 70 if you have a 30-pack-year history of smoking and currently smoke or have quit within the past 15 years. Fecal occult blood test (FOBT) of the stool. You may have this test every year starting at age 58. Flexible sigmoidoscopy or colonoscopy. You may have a sigmoidoscopy every 5 years or a colonoscopy every 10 years starting at age 74. Hepatitis C blood test. Hepatitis B blood test. Sexually transmitted disease (STD) testing. Diabetes screening. This is done by checking your blood sugar (  glucose) after you have not eaten for a while (fasting). You may have this done every 1-3 years. Bone density scan. This is done to screen for osteoporosis. You may have this done starting at age 55. Mammogram. This may be done every 1-2 years. Talk to your health care  provider about how often you should have regular mammograms. Talk with your health care provider about your test results, treatment options, and if necessary, the need for more tests. Vaccines  Your health care provider may recommend certain vaccines, such as: Influenza vaccine. This is recommended every year. Tetanus, diphtheria, and acellular pertussis (Tdap, Td) vaccine. You may need a Td booster every 10 years. Zoster vaccine. You may need this after age 95. Pneumococcal 13-valent conjugate (PCV13) vaccine. One dose is recommended after age 34. Pneumococcal polysaccharide (PPSV23) vaccine. One dose is recommended after age 43. Talk to your health care provider about which screenings and vaccines you need and how often you need them. This information is not intended to replace advice given to you by your health care provider. Make sure you discuss any questions you have with your health care provider. Document Released: 06/21/2015 Document Revised: 02/12/2016 Document Reviewed: 03/26/2015 Elsevier Interactive Patient Education  2017 [place] Prevention in the Home Falls can cause injuries. They can happen to people of all ages. There are many things you can do to make your home safe and to help prevent falls. What can I do on the outside of my home? Regularly fix the edges of walkways and driveways and fix any cracks. Remove anything that might make you trip as you walk through a door, such as a raised step or threshold. Trim any bushes or trees on the path to your home. Use bright outdoor lighting. Clear any walking paths of anything that might make someone trip, such as rocks or tools. Regularly check to see if handrails are loose or broken. Make sure that both sides of any steps have handrails. Any raised decks and porches should have guardrails on the edges. Have any leaves, snow, or ice cleared regularly. Use sand or salt on walking paths during winter. Clean up any  spills in your garage right away. This includes oil or grease spills. What can I do in the bathroom? Use night lights. Install grab bars by the toilet and in the tub and shower. Do not use towel bars as grab bars. Use non-skid mats or decals in the tub or shower. If you need to sit down in the shower, use a plastic, non-slip stool. Keep the floor dry. Clean up any water that spills on the floor as soon as it happens. Remove soap buildup in the tub or shower regularly. Attach bath mats securely with double-sided non-slip rug tape. Do not have throw rugs and other things on the floor that can make you trip. What can I do in the bedroom? Use night lights. Make sure that you have a light by your bed that is easy to reach. Do not use any sheets or blankets that are too big for your bed. They should not hang down onto the floor. Have a firm chair that has side arms. You can use this for support while you get dressed. Do not have throw rugs and other things on the floor that can make you trip. What can I do in the kitchen? Clean up any spills right away. Avoid walking on wet floors. Keep items that you use a lot in easy-to-reach places.  If you need to reach something above you, use a strong step stool that has a grab bar. Keep electrical cords out of the way. Do not use floor polish or wax that makes floors slippery. If you must use wax, use non-skid floor wax. Do not have throw rugs and other things on the floor that can make you trip. What can I do with my stairs? Do not leave any items on the stairs. Make sure that there are handrails on both sides of the stairs and use them. Fix handrails that are broken or loose. Make sure that handrails are as long as the stairways. Check any carpeting to make sure that it is firmly attached to the stairs. Fix any carpet that is loose or worn. Avoid having throw rugs at the top or bottom of the stairs. If you do have throw rugs, attach them to the floor  with carpet tape. Make sure that you have a light switch at the top of the stairs and the bottom of the stairs. If you do not have them, ask someone to add them for you. What else can I do to help prevent falls? Wear shoes that: Do not have high heels. Have rubber bottoms. Are comfortable and fit you well. Are closed at the toe. Do not wear sandals. If you use a stepladder: Make sure that it is fully opened. Do not climb a closed stepladder. Make sure that both sides of the stepladder are locked into place. Ask someone to hold it for you, if possible. Clearly mark and make sure that you can see: Any grab bars or handrails. First and last steps. Where the edge of each step is. Use tools that help you move around (mobility aids) if they are needed. These include: Canes. Walkers. Scooters. Crutches. Turn on the lights when you go into a dark area. Replace any light bulbs as soon as they burn out. Set up your furniture so you have a clear path. Avoid moving your furniture around. If any of your floors are uneven, fix them. If there are any pets around you, be aware of where they are. Review your medicines with your doctor. Some medicines can make you feel dizzy. This can increase your chance of falling. Ask your doctor what other things that you can do to help prevent falls. This information is not intended to replace advice given to you by your health care provider. Make sure you discuss any questions you have with your health care provider. Document Released: 03/21/2009 Document Revised: 10/31/2015 Document Reviewed: 06/29/2014 Elsevier Interactive Patient Education  2017 [employer].

## 2021-05-09 NOTE — Progress Notes (Signed)
ErrorThis encounter was created in error - please disregard. This encounter was created in error - please disregard.

## 2021-05-15 ENCOUNTER — Other Ambulatory Visit: Payer: Self-pay

## 2021-05-15 ENCOUNTER — Encounter (INDEPENDENT_AMBULATORY_CARE_PROVIDER_SITE_OTHER): Payer: Self-pay

## 2021-05-15 ENCOUNTER — Ambulatory Visit (INDEPENDENT_AMBULATORY_CARE_PROVIDER_SITE_OTHER): Payer: Medicare HMO | Admitting: Internal Medicine

## 2021-05-15 ENCOUNTER — Encounter: Payer: Self-pay | Admitting: Internal Medicine

## 2021-05-15 VITALS — BP 172/98 | HR 64 | Resp 16 | Ht 64.0 in | Wt 170.0 lb

## 2021-05-15 DIAGNOSIS — H6504 Acute serous otitis media, recurrent, right ear: Secondary | ICD-10-CM

## 2021-05-15 MED ORDER — AMOXICILLIN-POT CLAVULANATE 875-125 MG PO TABS
1.0000 | ORAL_TABLET | Freq: Two times a day (BID) | ORAL | 0 refills | Status: DC
Start: 2021-05-15 — End: 2021-08-28

## 2021-05-15 NOTE — Progress Notes (Signed)
Acute Office Visit  Subjective:    Patient ID: Adam [name], male    DOB: [birth year], 70 y.o.   MRN: [id number]  Chief Complaint  Patient presents with   Ear Pain    Pt having right ear pain since 02-25-21    HPI Patient is in today for c/o right ear pain, which is constant when he doesn't have denture. He was given Ofloxacin ear drops 3 months ago, and had improved initially, but has been getting worse for last 2 months or so. He reports foreign body sensation in the ear. Denies any ear discharge. Denies any fever, chills, tinnitus. He has difficulty opening mouth, has jaw pain referring from right ear. Denies any recent injury. Has not used any sharp objects for cleaning purposes.  Past Medical History:  Diagnosis Date   Allergy    BMI 31.0-31.9,adult JUNE 2011 180 LBS    Chronic back pain    Depression    Eosinophilic granuloma ([place]) JAN 2011 GASTRIC, followed by Dr. Eugenia Pancoast   EGD JAN 2011, JUN 2011, NOV 2011   Gastric ulcer 08/31/2012   GERD (gastroesophageal reflux disease)    HTN (hypertension)    Hyperlipemia    IBS (irritable bowel syndrome)    Prostate hypertrophy     Past Surgical History:  Procedure Laterality Date   BACK SURGERY     COLONOSCOPY  JAN 2011 COMPLETE   hypoTN and bradycardia w/ Demerol ,Phenergan and Versed, [place] TICS, SML IH   COLONOSCOPY  2003   NUR-normal   COLONOSCOPY  06/21/09   Fields-sigmoid diverticulosis, sm int hemorrhoids   COLONOSCOPY N/A 10/24/2013   Normal mucosa in the terminal ileum Moderate diverticulosis noted in the sigmoid colonModerate sized internal hemorrhoids. negative random colon bx.. next TCS 10/2023   Cyst Removed     from Left leg and knee   ESOPHAGOGASTRODUODENOSCOPY  05/20/2012   SLF: MILD ESOPHAGITIS.NO BARRETT'S/Multiple ulcers ranging between 3-5 mm/MILD Duodenal inflammation was found in the duodenal bulb   ESOPHAGOGASTRODUODENOSCOPY N/A 09/05/2012   SLF: MILD Non-erosive gastritis (inflammation) in the  gastric antrum. Biopsies benign, no H. pylori.   ESOPHAGOGASTRODUODENOSCOPY N/A 10/24/2013   KCL:EXNTZGYF DISTAL ESOPHAGEAL WEBSmall hiatal herniaMILD Non-erosive gastritis. SB bx benign. gastric bx, minimal inflammation.    GANGLION CYST EXCISION     from L wrist and pins placed for Fx involving L thumb   HAND SURGERY  at the age of 35   Brother accidentally cut it   MALONEY DILATION N/A 10/24/2013   Procedure: Venia Minks DILATION;  Surgeon: Danie Binder, MD;  [facility];  Service: Endoscopy;  Laterality: N/A;   MASS EXCISION  04/29/2012   Procedure: EXCISION MASS;  Surgeon: Donato Heinz, MD;  Location: [facility];  Service: General;  Laterality: Right;  Excision of Vascular Mass Right Arm   SAVORY DILATION N/A 10/24/2013   Procedure: SAVORY DILATION;  Surgeon: Danie Binder, MD;  [facility];  Service: Endoscopy;  Laterality: N/A;   SHOULDER SURGERY Left 2017   TUMOR REMOVAL  1994   Benign from brain at [place]   GASTRIC ULCER-EOS GRANULOMA   UPPER GASTROINTESTINAL ENDOSCOPY  JUN 2011   GASTRIC ULCER- EOS, NO H. PYLORI   UPPER GASTROINTESTINAL ENDOSCOPY  NOV 2011   GASTRIC NODULE, no ulcer- EOS    Family History  Problem Relation Age of Onset   Hypertension Mother    Diabetes Mother  Hyperlipidemia Mother    Thyroid disease Mother    Hypertension Father    Diabetes Father    Stroke Father    Colon cancer Maternal Grandmother        OVER 32    Social History   Socioeconomic History   Marital status: Married    Spouse name: [name]   Number of children: 1   Years of education: 12   Highest education level: Not on file  Occupational History   Occupation: disabled  Tobacco Use   Smoking status: Former    Packs/day: 0.25    Years: 15.00    Pack years: 3.75    Types: 44, Cigarettes    Quit date: 04/29/2002    Years since quitting: 19.0   Smokeless tobacco: Never  Substance and Sexual  Activity   Alcohol use: Not Currently   Drug use: No   Sexual activity: Yes    Birth control/protection: None  Other Topics Concern   Not on file  Social History Narrative   Lives w/ wife   Caffeine use: 1 cup coffee every morning   Social Determinants of Health   Financial Resource Strain: Low Risk    Difficulty of Paying Living Expenses: Not hard at all  Food Insecurity: No Food Insecurity   Worried About Charity fundraiser in the Last Year: Never true   [place] in the Last Year: Never true  Transportation Needs: No Transportation Needs   Lack of Transportation (Medical): No   Lack of Transportation (Non-Medical): No  Physical Activity: Sufficiently Active   Days of Exercise per Week: 5 days   Minutes of Exercise per Session: 30 min  Stress: No Stress Concern Present   Feeling of Stress : Not at all  Social Connections: Moderately Integrated   Frequency of Communication with Friends and Family: Once a week   Frequency of Social Gatherings with Friends and Family: More than three times a week   Attends Religious Services: More than 4 times per year   Active Member of [employer] or Organizations: No   Attends Archivist Meetings: Never   Marital Status: Married  Human resources officer Violence: Not At Risk   Fear of Current or Ex-Partner: No   Emotionally Abused: No   Physically Abused: No   Sexually Abused: No    Outpatient Medications Prior to Visit  Medication Sig Dispense Refill   doxazosin (CARDURA) 8 MG tablet TAKE 1 TABLET BY MOUTH EVERYDAY AT BEDTIME 90 tablet 1   enalapril (VASOTEC) 20 MG tablet TAKE 1 TABLET EVERY DAY 90 tablet 1   loratadine (CLARITIN) 10 MG tablet Take 10 mg by mouth daily as needed for allergies.      ofloxacin (FLOXIN OTIC) 0.3 % OTIC solution Place 5 drops into the right ear daily. 5 mL 0   pantoprazole (PROTONIX) 40 MG tablet TAKE 1 TABLET TWICE DAILY 180 tablet 1   PARoxetine (PAXIL) 20 MG tablet Take 1 tablet (20 mg total) by  mouth daily. 90 tablet 1   pravastatin (PRAVACHOL) 20 MG tablet TAKE 1 TABLET AT BEDTIME (NEED MD APPOINTMENT) 90 tablet 1   verapamil (CALAN-SR) 180 MG CR tablet Take 1 tablet (180 mg total) by mouth 2 (two) times daily. 180 tablet 1   No facility-administered medications prior to visit.    Allergies  Allergen Reactions   Sulfonamide Derivatives     REACTION: causes hives    Review of Systems  Constitutional:  Negative for chills  and fever.  HENT:  Positive for ear pain (Right). Negative for congestion and sore throat.   Eyes:  Negative for pain and discharge.  Respiratory:  Negative for cough and shortness of breath.   Cardiovascular:  Negative for chest pain and palpitations.  Gastrointestinal:  Negative for constipation, diarrhea, nausea and vomiting.  Endocrine: Negative for polydipsia and polyuria.  Genitourinary:  Negative for dysuria and hematuria.  Musculoskeletal:  Negative for neck pain and neck stiffness.  Skin:  Negative for rash.  Neurological:  Negative for dizziness, weakness, numbness and headaches.  Psychiatric/Behavioral:  Negative for agitation and behavioral problems.       Objective:    Physical Exam Vitals reviewed.  Constitutional:      General: He is not in acute distress.    Appearance: He is not diaphoretic.  HENT:     Head: Normocephalic and atraumatic.     Right Ear: Ear canal normal. A middle ear effusion is present. Tympanic membrane is erythematous.     Left Ear:  No middle ear effusion. Tympanic membrane is not erythematous.     Nose: Nose normal.     Mouth/Throat:     Mouth: Mucous membranes are moist.  Eyes:     General: No scleral icterus.    Extraocular Movements: Extraocular movements intact.     Pupils: Pupils are equal, round, and reactive to light.  Cardiovascular:     Rate and Rhythm: Normal rate and regular rhythm.     Pulses: Normal pulses.     Heart sounds: Murmur heard.  Pulmonary:     Breath sounds: Normal breath  sounds. No wheezing or rales.  Abdominal:     Palpations: Abdomen is soft.     Tenderness: There is no abdominal tenderness.  Musculoskeletal:     Cervical back: Neck supple. No tenderness.     Right lower leg: No edema.     Left lower leg: No edema.  Skin:    General: Skin is warm.     Findings: No rash.  Neurological:     General: No focal deficit present.     Mental Status: He is alert and oriented to person, place, and time.  Psychiatric:        Mood and Affect: Mood normal.        Behavior: Behavior normal.    BP (!) 172/98 (BP Location: Left Arm, Patient Position: Sitting, Cuff Size: Normal)   Pulse 64   Resp 16   Ht 5\' 4"  (1.626 m)   Wt 170 lb 0.6 oz (77.1 kg)   SpO2 99%   BMI 29.19 kg/m  Wt Readings from Last 3 Encounters:  05/15/21 170 lb 0.6 oz (77.1 kg)  02/25/21 170 lb 0.6 oz (77.1 kg)  12/02/20 168 lb (76.2 kg)        Assessment & Plan:   Problem List Items Addressed This Visit       Nervous and Auditory   Recurrent acute serous otitis media of right ear - Primary    Has completed ofloxacin eardrops Has persistent right ear pain referring to right jaw Has mild erythema in ear canal Started Augmentin Referred to ENT       Relevant Medications   amoxicillin-clavulanate (AUGMENTIN) 875-125 MG tablet   Other Relevant Orders   Ambulatory referral to ENT     Meds ordered this encounter  Medications   amoxicillin-clavulanate (AUGMENTIN) 875-125 MG tablet    Sig: Take 1 tablet by mouth 2 (two) times daily.  Dispense:  14 tablet    Refill:  0     Corwyn Vora Keith Rake, MD

## 2021-05-15 NOTE — Assessment & Plan Note (Signed)
Has completed ofloxacin eardrops Has persistent right ear pain referring to right jaw Has mild erythema in ear canal Started Augmentin Referred to ENT

## 2021-07-02 DIAGNOSIS — H9201 Otalgia, right ear: Secondary | ICD-10-CM | POA: Diagnosis not present

## 2021-07-02 DIAGNOSIS — H9041 Sensorineural hearing loss, unilateral, right ear, with unrestricted hearing on the contralateral side: Secondary | ICD-10-CM | POA: Diagnosis not present

## 2021-07-02 DIAGNOSIS — R221 Localized swelling, mass and lump, neck: Secondary | ICD-10-CM | POA: Diagnosis not present

## 2021-07-02 DIAGNOSIS — R42 Dizziness and giddiness: Secondary | ICD-10-CM | POA: Diagnosis not present

## 2021-07-03 ENCOUNTER — Other Ambulatory Visit: Payer: Self-pay | Admitting: Otolaryngology

## 2021-07-03 DIAGNOSIS — H9201 Otalgia, right ear: Secondary | ICD-10-CM

## 2021-07-03 DIAGNOSIS — H9041 Sensorineural hearing loss, unilateral, right ear, with unrestricted hearing on the contralateral side: Secondary | ICD-10-CM

## 2021-07-07 ENCOUNTER — Other Ambulatory Visit: Payer: Self-pay | Admitting: Otolaryngology

## 2021-07-07 DIAGNOSIS — R221 Localized swelling, mass and lump, neck: Secondary | ICD-10-CM

## 2021-07-08 ENCOUNTER — Ambulatory Visit
Admission: RE | Admit: 2021-07-08 | Discharge: 2021-07-08 | Disposition: A | Discharge status: Home / Self Care | Payer: Medicare HMO | Source: Ambulatory Visit | Attending: Otolaryngology | Admitting: Otolaryngology

## 2021-07-08 DIAGNOSIS — G9389 Other specified disorders of brain: Secondary | ICD-10-CM | POA: Diagnosis not present

## 2021-07-08 DIAGNOSIS — H9041 Sensorineural hearing loss, unilateral, right ear, with unrestricted hearing on the contralateral side: Secondary | ICD-10-CM | POA: Diagnosis not present

## 2021-07-08 DIAGNOSIS — R221 Localized swelling, mass and lump, neck: Secondary | ICD-10-CM | POA: Diagnosis not present

## 2021-07-08 DIAGNOSIS — M26601 Right temporomandibular joint disorder, unspecified: Secondary | ICD-10-CM | POA: Diagnosis not present

## 2021-07-08 DIAGNOSIS — M47812 Spondylosis without myelopathy or radiculopathy, cervical region: Secondary | ICD-10-CM | POA: Diagnosis not present

## 2021-07-08 DIAGNOSIS — H9201 Otalgia, right ear: Secondary | ICD-10-CM

## 2021-07-08 MED ORDER — GADOBENATE DIMEGLUMINE 529 MG/ML IV SOLN
15.0000 mL | Freq: Once | INTRAVENOUS | Status: AC | PRN
  Administered 2021-07-08: 15 mL via INTRAVENOUS

## 2021-07-29 ENCOUNTER — Ambulatory Visit: Payer: Medicare HMO | Admitting: Internal Medicine

## 2021-08-08 ENCOUNTER — Other Ambulatory Visit: Payer: Self-pay | Admitting: Internal Medicine

## 2021-08-08 ENCOUNTER — Telehealth: Payer: Self-pay

## 2021-08-08 DIAGNOSIS — K219 Gastro-esophageal reflux disease without esophagitis: Secondary | ICD-10-CM

## 2021-08-08 DIAGNOSIS — I10 Essential (primary) hypertension: Secondary | ICD-10-CM

## 2021-08-08 DIAGNOSIS — F320 Major depressive disorder, single episode, mild: Secondary | ICD-10-CM

## 2021-08-28 ENCOUNTER — Ambulatory Visit (INDEPENDENT_AMBULATORY_CARE_PROVIDER_SITE_OTHER): Payer: Medicare HMO | Admitting: Internal Medicine

## 2021-08-28 ENCOUNTER — Other Ambulatory Visit: Payer: Self-pay

## 2021-08-28 ENCOUNTER — Encounter: Payer: Self-pay | Admitting: Internal Medicine

## 2021-08-28 VITALS — BP 134/66 | HR 64 | Resp 18 | Ht 64.0 in | Wt 166.8 lb

## 2021-08-28 DIAGNOSIS — Z0000 Encounter for general adult medical examination without abnormal findings: Secondary | ICD-10-CM | POA: Diagnosis not present

## 2021-08-28 DIAGNOSIS — E785 Hyperlipidemia, unspecified: Secondary | ICD-10-CM

## 2021-08-28 DIAGNOSIS — K219 Gastro-esophageal reflux disease without esophagitis: Secondary | ICD-10-CM

## 2021-08-28 DIAGNOSIS — I350 Nonrheumatic aortic (valve) stenosis: Secondary | ICD-10-CM | POA: Diagnosis not present

## 2021-08-28 DIAGNOSIS — I10 Essential (primary) hypertension: Secondary | ICD-10-CM

## 2021-08-28 DIAGNOSIS — N400 Enlarged prostate without lower urinary tract symptoms: Secondary | ICD-10-CM | POA: Diagnosis not present

## 2021-08-28 DIAGNOSIS — E559 Vitamin D deficiency, unspecified: Secondary | ICD-10-CM | POA: Diagnosis not present

## 2021-08-28 MED ORDER — ENALAPRIL MALEATE 20 MG PO TABS: ORAL_TABLET | ORAL | 1 refills | Status: DC

## 2021-08-28 MED ORDER — PRAVASTATIN SODIUM 20 MG PO TABS: 20.0000 mg | ORAL_TABLET | Freq: Every day | ORAL | 1 refills | Status: DC

## 2021-08-28 NOTE — Assessment & Plan Note (Signed)
On Doxazosin ?

## 2021-08-28 NOTE — Assessment & Plan Note (Signed)
Echo reviewed, asymptomatic currently Will refer to cardiology if he has dyspnea, palpitations, LE swelling or dizziness 

## 2021-08-28 NOTE — Progress Notes (Signed)
? ?Established Patient Office Visit ? ?Subjective:  ?Patient ID: Adam [name], male    DOB: November 15, 1950  Age: 71 y.o. MRN: [id number] ? ?CC:  ?Chief Complaint  ?Patient presents with  ? Follow-up  ?  5 month follow up   ? ? ?HPI ?[name] is a 71 y.o. male with past medical history of HTN, GERD, eosinophilic gastritis, BPH, HLD and depression who presents for f/u of his chronic medical conditions. ? ?HTN: BP is well-controlled. Takes medications - Enalapril and Verapamil regularly. Patient denies headache, dizziness, chest pain, dyspnea or palpitations. ?  ?GERD: He takes Pantoprazole for it. He denies any dysphagia or odynophagia currently. ?  ?BPH: He takes Doxazosin. Denies any dysuria or hematuria. ? ?Depression: He has been doing well without Paxil now. ? ?He has seen ENT specialist for right ear pain/jaw pain.  He had MRI of the brain and ultrasound of neck, which well benign.  It did show right TM joint arthropathy, for which he has seen dentist.  He has modified his diet and his jaw pain has improved now. ? ? ? ?Past Medical History:  ?Diagnosis Date  ? Allergy   ? BMI 31.0-31.9,adult JUNE 2011 180 LBS   ? Chronic back pain   ? Depression   ? Eosinophilic granuloma ([place]) JAN 2011 GASTRIC, followed by Dr. Eugenia Pancoast  ? EGD JAN 2011, JUN 2011, NOV 2011  ? Gastric ulcer 08/31/2012  ? GERD (gastroesophageal reflux disease)   ? HTN (hypertension)   ? Hyperlipemia   ? IBS (irritable bowel syndrome)   ? Prostate hypertrophy   ? ? ?Past Surgical History:  ?Procedure Laterality Date  ? BACK SURGERY    ? COLONOSCOPY  JAN 2011 COMPLETE  ? hypoTN and bradycardia w/ Demerol ,Phenergan and Versed, [place] TICS, SML IH  ? COLONOSCOPY  2003  ? NUR-normal  ? COLONOSCOPY  06/21/09  ? Fields-sigmoid diverticulosis, sm int hemorrhoids  ? COLONOSCOPY N/A 10/24/2013  ? Normal mucosa in the terminal ileum Moderate diverticulosis noted in the sigmoid colonModerate sized internal hemorrhoids. negative random colon bx.. next  TCS 10/2023  ? Cyst Removed    ? from Left leg and knee  ? ESOPHAGOGASTRODUODENOSCOPY  05/20/2012  ? SLF: MILD ESOPHAGITIS.NO BARRETT'S/Multiple ulcers ranging between 3-5 mm/MILD Duodenal inflammation was found in the duodenal bulb  ? ESOPHAGOGASTRODUODENOSCOPY N/A 09/05/2012  ? SLF: MILD Non-erosive gastritis (inflammation) in the gastric antrum. Biopsies benign, no H. pylori.  ? ESOPHAGOGASTRODUODENOSCOPY N/A 10/24/2013  ? INO:MVEHMCNO DISTAL ESOPHAGEAL WEBSmall hiatal herniaMILD Non-erosive gastritis. SB bx benign. gastric bx, minimal inflammation.   ? GANGLION CYST EXCISION    ? from L wrist and pins placed for Fx involving L thumb  ? HAND SURGERY  at the age of 56  ? Brother accidentally cut it  ? MALONEY DILATION N/A 10/24/2013  ? Procedure: MALONEY DILATION;  Surgeon: Danie Binder, MD;  [facility];  Service: Endoscopy;  Laterality: N/A;  ? MASS EXCISION  04/29/2012  ? Procedure: EXCISION MASS;  Surgeon: Donato Heinz, MD;  Location: [facility];  Service: General;  Laterality: Right;  Excision of Vascular Mass Right Arm  ? SAVORY DILATION N/A 10/24/2013  ? Procedure: SAVORY DILATION;  Surgeon: Danie Binder, MD;  [facility];  Service: Endoscopy;  Laterality: N/A;  ? SHOULDER SURGERY Left 2017  ? TUMOR REMOVAL  1994  ? Ben[facility]  ? UPPER GASTROINTESTINAL ENDOSCOPY  JAN 2011  ?  GASTRIC ULCER-EOS GRANULOMA  ? UPPER GASTROINTESTINAL ENDOSCOPY  JUN 2011  ? GASTRIC ULCER- EOS, NO H. PYLORI  ? UPPER GASTROINTESTINAL ENDOSCOPY  NOV 2011  ? GASTRIC NODULE, no ulcer- EOS  ? ? ?Family History  ?Problem Relation Age of Onset  ? Hypertension Mother   ? Diabetes Mother   ? Hyperlipidemia Mother   ? Thyroid disease Mother   ? Hypertension Father   ? Diabetes Father   ? Stroke Father   ? Colon cancer Maternal Grandmother   ?     OVER 60  ? ? ?Social History  ? ?Socioeconomic History  ? Marital status: Married  ?  Spouse name: [name]  ? Number of children: 1  ? Years of  education: 27  ? Highest education level: Not on file  ?Occupational History  ? Occupation: disabled  ?Tobacco Use  ? Smoking status: Former  ?  Packs/day: 0.25  ?  Years: 15.00  ?  Pack years: 3.75  ?  Types: Cigars, Cigarettes  ?  Quit date: 04/29/2002  ?  Years since quitting: 19.3  ? Smokeless tobacco: Never  ?Substance and Sexual Activity  ? Alcohol use: Not Currently  ? Drug use: No  ? Sexual activity: Yes  ?  Birth control/protection: None  ?Other Topics Concern  ? Not on file  ?Social History Narrative  ? Lives w/ wife  ? Caffeine use: 1 cup coffee every morning  ? ?Social Determinants of Health  ? ?Financial Resource Strain: Low Risk   ? Difficulty of Paying Living Expenses: Not hard at all  ?Food Insecurity: No Food Insecurity  ? Worried About Charity fundraiser in the Last Year: Never true  ? Ran Out of Food in the Last Year: Never true  ?Transportation Needs: No Transportation Needs  ? Lack of Transportation (Medical): No  ? Lack of Transportation (Non-Medical): No  ?Physical Activity: Sufficiently Active  ? Days of Exercise per Week: 5 days  ? Minutes of Exercise per Session: 30 min  ?Stress: No Stress Concern Present  ? Feeling of Stress : Not at all  ?Social Connections: Moderately Integrated  ? Frequency of Communication with Friends and Family: Once a week  ? Frequency of Social Gatherings with Friends and Family: More than three times a week  ? Attends Religious Services: More than 4 times per year  ? Active Member of Clubs or Organizations: No  ? Attends Archivist Meetings: Never  ? Marital Status: Married  ?Intimate Partner Violence: Not At Risk  ? Fear of Current or Ex-Partner: No  ? Emotionally Abused: No  ? Physically Abused: No  ? Sexually Abused: No  ? ? ?Outpatient Medications Prior to Visit  ?Medication Sig Dispense Refill  ? doxazosin (CARDURA) 8 MG tablet TAKE 1 TABLET AT BEDTIME 90 tablet 1  ? loratadine (CLARITIN) 10 MG tablet Take 10 mg by mouth daily as needed for  allergies.     ? pantoprazole (PROTONIX) 40 MG tablet TAKE 1 TABLET TWICE DAILY 180 tablet 1  ? verapamil (CALAN-SR) 180 MG CR tablet TAKE 1 TABLET TWICE DAILY 180 tablet 1  ? enalapril (VASOTEC) 20 MG tablet TAKE 1 TABLET EVERY DAY 90 tablet 1  ? ofloxacin (FLOXIN OTIC) 0.3 % OTIC solution Place 5 drops into the right ear daily. 5 mL 0  ? pravastatin (PRAVACHOL) 20 MG tablet TAKE 1 TABLET AT BEDTIME (NEED MD APPOINTMENT) 90 tablet 1  ? amoxicillin-clavulanate (AUGMENTIN) 875-125 MG tablet Take 1 tablet  by mouth 2 (two) times daily. (Patient not taking: Reported on 08/28/2021) 14 tablet 0  ? PARoxetine (PAXIL) 20 MG tablet TAKE 1 TABLET EVERY DAY (Patient not taking: Reported on 08/28/2021) 90 tablet 1  ? ?No facility-administered medications prior to visit.  ? ? ?Allergies  ?Allergen Reactions  ? Sulfonamide Derivatives   ?  REACTION: causes hives  ? ? ?ROS ?Review of Systems  ?Constitutional:  Negative for chills and fever.  ?HENT:  Negative for congestion and sore throat.   ?Eyes:  Negative for pain and discharge.  ?Respiratory:  Negative for cough and shortness of breath.   ?Cardiovascular:  Negative for chest pain and palpitations.  ?Gastrointestinal:  Negative for constipation, diarrhea, nausea and vomiting.  ?Endocrine: Negative for polydipsia and polyuria.  ?Genitourinary:  Negative for dysuria and hematuria.  ?Musculoskeletal:  Negative for neck pain and neck stiffness.  ?Skin:  Negative for rash.  ?Neurological:  Negative for dizziness, weakness, numbness and headaches.  ?Psychiatric/Behavioral:  Negative for agitation and behavioral problems.   ? ?  ?Objective:  ?  ?Physical Exam ?Vitals reviewed.  ?Constitutional:   ?   General: He is not in acute distress. ?   Appearance: He is not diaphoretic.  ?HENT:  ?   Head: Normocephalic and atraumatic.  ?   Nose: Nose normal.  ?   Mouth/Throat:  ?   Mouth: Mucous membranes are moist.  ?Eyes:  ?   General: No scleral icterus. ?   Extraocular Movements: Extraocular  movements intact.  ?Cardiovascular:  ?   Rate and Rhythm: Normal rate and regular rhythm.  ?   Pulses: Normal pulses.  ?   Heart sounds: Murmur heard.  ?Pulmonary:  ?   Breath sounds: Normal breath sounds. No wheezin

## 2021-08-28 NOTE — Assessment & Plan Note (Signed)
BP Readings from Last 1 Encounters:  ?08/28/21 134/66  ? ?Well-controlled with Verapamil and Enalapril ?Counseled for compliance with the medications ?Advised DASH diet and moderate exercise/walking, at least 150 mins/week ?

## 2021-08-28 NOTE — Patient Instructions (Signed)
Please continue taking medications as prescribed. ? ?Please continue to follow low salt diet and ambulate as tolerated. ? ?Please get fasting blood tests done before the next visit. ? ?Please consider getting Shingrix vaccine at your local pharmacy. ?

## 2021-08-28 NOTE — Assessment & Plan Note (Signed)
Well-controlled, on Pantoprazole. 

## 2021-11-04 ENCOUNTER — Other Ambulatory Visit: Payer: Self-pay | Admitting: Internal Medicine

## 2021-11-04 DIAGNOSIS — E785 Hyperlipidemia, unspecified: Secondary | ICD-10-CM

## 2021-11-04 DIAGNOSIS — I10 Essential (primary) hypertension: Secondary | ICD-10-CM

## 2021-11-05 ENCOUNTER — Ambulatory Visit (INDEPENDENT_AMBULATORY_CARE_PROVIDER_SITE_OTHER): Payer: Medicare HMO | Admitting: Family Medicine

## 2021-11-05 ENCOUNTER — Encounter: Payer: Self-pay | Admitting: Family Medicine

## 2021-11-05 ENCOUNTER — Ambulatory Visit: Payer: Medicare HMO | Admitting: Family Medicine

## 2021-11-05 VITALS — BP 164/82 | HR 58 | Ht 64.0 in | Wt 160.1 lb

## 2021-11-05 DIAGNOSIS — G8929 Other chronic pain: Secondary | ICD-10-CM | POA: Diagnosis not present

## 2021-11-05 DIAGNOSIS — M179 Osteoarthritis of knee, unspecified: Secondary | ICD-10-CM | POA: Insufficient documentation

## 2021-11-05 DIAGNOSIS — M25561 Pain in right knee: Secondary | ICD-10-CM | POA: Diagnosis not present

## 2021-11-05 DIAGNOSIS — I10 Essential (primary) hypertension: Secondary | ICD-10-CM | POA: Diagnosis not present

## 2021-11-05 DIAGNOSIS — M25569 Pain in unspecified knee: Secondary | ICD-10-CM | POA: Insufficient documentation

## 2021-11-05 NOTE — Progress Notes (Signed)
   Adam [name]     MRN: [id number]      DOB: 16-Jan-1951   HPI Adam [name] is here with a 2 day h/o increased pain and swelling of right knee, no inciting trauma, chronic h/o knee pain, for over 2 years , which has been worsening. C/o instability of the joint, has not actually fallen Pain rated at 9 today  ROS See HPI    PE  BP (!) 164/82   Pulse (!) 58   Ht '5\' 4"'$  (1.626 m)   Wt 160 lb 1.9 oz (72.6 kg)   SpO2 98%   BMI 27.48 kg/m   Patient alert and oriented and in no cardiopulmonary distress.  HEENT: No facial asymmetry, EOMI,     Neck supple .  Chest: Clear to auscultation bilaterally.  CVS: S1, S2 no murmurs, no S3.Regular rate.  Ext: No edema  MS: Swelling, deformity and reduced ROM right knees  Skin: Intact, no ulcerations or rash noted.  Psych: Good eye contact, normal affect. not anxious or depressed appearing. power,  normal throughout.no focal deficits noted.   Assessment & Plan  Knee pain approx 2 year h/o right knee pain and swelling, also has  Had instability in the knee, with near falls. Using a cane since yesterday due to uncontrolled 9 pain in knee, .sitting causes pain to radiate to right buttock, past h/o lumbar spine surgery  Essential hypertension, benign DASH diet and commitment to daily physical activity for a minimum of 30 minutes discussed and encouraged, as a part of hypertension management. The importance of attaining a healthy weight is also discussed. Elevated on several occasions, sooner appt with pCP to address this     11/05/2021   11:28 AM 11/05/2021   10:57 AM 08/28/2021    3:52 PM 08/28/2021    3:28 PM 05/15/2021    8:09 AM 02/25/2021   10:00 AM 02/11/2021   11:53 AM  BP/Weight  Systolic BP 409 811 914 782 956 213 086  Diastolic BP 82 81 66 82 98 74 77  Wt. (Lbs)  160.12  166.8 170.04 170.04   BMI  27.48 kg/m2  28.63 kg/m2 29.19 kg/m2 29.19 kg/m2

## 2021-11-05 NOTE — Assessment & Plan Note (Addendum)
approx 2 year h/o right knee pain and swelling, also has  Had instability in the knee, with near falls. Using a cane since yesterday due to uncontrolled 9 pain in knee, .sitting causes pain to radiate to right buttock, past h/o lumbar spine surgery

## 2021-11-05 NOTE — Patient Instructions (Addendum)
F/U with dr Posey Pronto in 1 to 3 weeks, re evaluate blood pressure  You are referred urgently to Orthopedics in [place], appointment is tomorrow and Nurse will provide you with the information  Take ES tylenol up to 4 per day for knee pain   Blood pressure is high at visit, please bring all medication that you are taking tpo next appointment

## 2021-11-05 NOTE — Assessment & Plan Note (Signed)
DASH diet and commitment to daily physical activity for a minimum of 30 minutes discussed and encouraged, as a part of hypertension management. The importance of attaining a healthy weight is also discussed. Elevated on several occasions, sooner appt with pCP to address this     11/05/2021   11:28 AM 11/05/2021   10:57 AM 08/28/2021    3:52 PM 08/28/2021    3:28 PM 05/15/2021    8:09 AM 02/25/2021   10:00 AM 02/11/2021   11:53 AM  BP/Weight  Systolic BP 244 975 300 511 021 117 356  Diastolic BP 82 81 66 82 98 74 77  Wt. (Lbs)  160.12  166.8 170.04 170.04   BMI  27.48 kg/m2  28.63 kg/m2 29.19 kg/m2 29.19 kg/m2

## 2021-11-06 DIAGNOSIS — M1711 Unilateral primary osteoarthritis, right knee: Secondary | ICD-10-CM | POA: Diagnosis not present

## 2021-11-26 ENCOUNTER — Encounter: Payer: Self-pay | Admitting: Internal Medicine

## 2021-11-26 ENCOUNTER — Ambulatory Visit (INDEPENDENT_AMBULATORY_CARE_PROVIDER_SITE_OTHER): Payer: Medicare HMO | Admitting: Internal Medicine

## 2021-11-26 VITALS — BP 152/78 | HR 86 | Resp 18 | Ht 64.0 in | Wt 162.2 lb

## 2021-11-26 DIAGNOSIS — I350 Nonrheumatic aortic (valve) stenosis: Secondary | ICD-10-CM | POA: Diagnosis not present

## 2021-11-26 DIAGNOSIS — I10 Essential (primary) hypertension: Secondary | ICD-10-CM

## 2021-11-26 MED ORDER — HYDRALAZINE HCL 25 MG PO TABS: 25.0000 mg | ORAL_TABLET | Freq: Two times a day (BID) | ORAL | 0 refills | Status: DC

## 2021-11-26 NOTE — Progress Notes (Unsigned)
Established Patient Office Visit  Subjective:  Patient ID: Adam [name], male    DOB: May 11, 1951  Age: 71 y.o. MRN: [id number]  CC:  Chief Complaint  Patient presents with   Follow-up    Bp check     HPI Adam [name] is a 71 y.o. male with past medical history of HTN, GERD, eosinophilic gastritis, BPH, HLD and depression who presents for f/u of his chronic medical conditions.     Past Medical History:  Diagnosis Date   Allergy    BMI 31.0-31.9,adult JUNE 2011 180 LBS    Chronic back pain    Depression    Eosinophilic granuloma ([place]) JAN 2011 GASTRIC, followed by Dr. Eugenia Pancoast   EGD JAN 2011, JUN 2011, NOV 2011   Gastric ulcer 08/31/2012   GERD (gastroesophageal reflux disease)    HTN (hypertension)    Hyperlipemia    IBS (irritable bowel syndrome)    Prostate hypertrophy     Past Surgical History:  Procedure Laterality Date   BACK SURGERY     COLONOSCOPY  JAN 2011 COMPLETE   hypoTN and bradycardia w/ Demerol ,Phenergan and Versed, [place] TICS, SML IH   COLONOSCOPY  2003   NUR-normal   COLONOSCOPY  06/21/09   Fields-sigmoid diverticulosis, sm int hemorrhoids   COLONOSCOPY N/A 10/24/2013   Normal mucosa in the terminal ileum Moderate diverticulosis noted in the sigmoid colonModerate sized internal hemorrhoids. negative random colon bx.. next TCS 10/2023   Cyst Removed     from Left leg and knee   ESOPHAGOGASTRODUODENOSCOPY  05/20/2012   SLF: MILD ESOPHAGITIS.NO BARRETT'S/Multiple ulcers ranging between 3-5 mm/MILD Duodenal inflammation was found in the duodenal bulb   ESOPHAGOGASTRODUODENOSCOPY N/A 09/05/2012   SLF: MILD Non-erosive gastritis (inflammation) in the gastric antrum. Biopsies benign, no H. pylori.   ESOPHAGOGASTRODUODENOSCOPY N/A 10/24/2013   ALP:[id number] DISTAL ESOPHAGEAL WEBSmall hiatal herniaMILD Non-erosive gastritis. SB bx benign. gastric bx, minimal inflammation.    GANGLION CYST EXCISION     from L wrist and pins placed for Fx involving L  thumb   HAND SURGERY  at the age of 35   Brother accidentally cut it   MALONEY DILATION N/A 10/24/2013   Procedure: Venia Minks DILATION;  Surgeon: Danie Binder, MD;  [facility];  Service: Endoscopy;  Laterality: N/A;   MASS EXCISION  04/29/2012   Procedure: EXCISION MASS;  Surgeon: Donato Heinz, MD;  Location: [facility];  Service: General;  Laterality: Right;  Excision of Vascular Mass Right Arm   SAVORY DILATION N/A 10/24/2013   Procedure: SAVORY DILATION;  Surgeon: Danie Binder, MD;  Location[facility];  Service: Endoscopy;  Laterality: N/A;   SHOULDER SURGERY Left 2017   TUMOR REMOVAL  1994   Benign from brain at [place]   GASTRIC ULCER-EOS GRANULOMA   UPPER GASTROINTESTINAL ENDOSCOPY  JUN 2011   GASTRIC ULCER- EOS, NO H. PYLORI   UPPER GASTROINTESTINAL ENDOSCOPY  NOV 2011   GASTRIC NODULE, no ulcer- EOS    Family History  Problem Relation Age of Onset   Hypertension Mother    Diabetes Mother    Hyperlipidemia Mother    Thyroid disease Mother    Hypertension Father    Diabetes Father    Stroke Father    Colon cancer Maternal Grandmother        OVER 82    Social History   Socioeconomic History   Marital status: Married  Spouse name: [name]   Number of children: 1   Years of education: 12   Highest education level: Not on file  Occupational History   Occupation: disabled  Tobacco Use   Smoking status: Former    Packs/day: 0.25    Years: 15.00    Total pack years: 3.75    Types: Cigars, Cigarettes    Quit date: 04/29/2002    Years since quitting: 19.5   Smokeless tobacco: Never  Substance and Sexual Activity   Alcohol use: Not Currently   Drug use: No   Sexual activity: Yes    Birth control/protection: None  Other Topics Concern   Not on file  Social History Narrative   Lives w/ wife   Caffeine use: 1 cup coffee every morning   Social Determinants of Health   Financial Resource  Strain: Low Risk  (05/07/2021)   Overall Financial Resource Strain (CARDIA)    Difficulty of Paying Living Expenses: Not hard at all  Food Insecurity: No Food Insecurity (05/07/2021)   Hunger Vital Sign    Worried About Running Out of Food in the Last Year: Never true    Ran Out of Food in the Last Year: Never true  Transportation Needs: No Transportation Needs (05/07/2021)   PRAPARE - Hydrologist (Medical): No    Lack of Transportation (Non-Medical): No  Physical Activity: Sufficiently Active (05/07/2021)   Exercise Vital Sign    Days of Exercise per Week: 5 days    Minutes of Exercise per Session: 30 min  Stress: No Stress Concern Present (05/07/2021)   [place]    Feeling of Stress : Not at all  Social Connections: Moderately Integrated (05/07/2021)   Social Connection and Isolation Panel [NHANES]    Frequency of Communication with Friends and Family: Once a week    Frequency of Social Gatherings with Friends and Family: More than three times a week    Attends Religious Services: More than 4 times per year    Active Member of [employer] or Organizations: No    Attends Archivist Meetings: Never    Marital Status: Married  Human resources officer Violence: Not At Risk (05/07/2021)   Humiliation, Afraid, Rape, and Kick questionnaire    Fear of Current or Ex-Partner: No    Emotionally Abused: No    Physically Abused: No    Sexually Abused: No    Outpatient Medications Prior to Visit  Medication Sig Dispense Refill   doxazosin (CARDURA) 8 MG tablet TAKE 1 TABLET AT BEDTIME 90 tablet 1   enalapril (VASOTEC) 20 MG tablet TAKE 1 TABLET EVERY DAY 90 tablet 1   loratadine (CLARITIN) 10 MG tablet Take 10 mg by mouth daily as needed for allergies.      pantoprazole (PROTONIX) 40 MG tablet TAKE 1 TABLET TWICE DAILY 180 tablet 1   pravastatin (PRAVACHOL) 20 MG tablet TAKE 1 TABLET AT BEDTIME  (NEED MD APPOINTMENT) 90 tablet 1   verapamil (CALAN-SR) 180 MG CR tablet TAKE 1 TABLET TWICE DAILY 180 tablet 1   No facility-administered medications prior to visit.    Allergies  Allergen Reactions   Sulfonamide Derivatives     REACTION: causes hives    ROS Review of Systems    Objective:    Physical Exam  BP 138/82 (BP Location: Right Arm, Patient Position: Sitting, Cuff Size: Normal)   Pulse 86   Resp 18   Ht $R'5\' 4"'Sh$  (  1.626 m)   Wt 162 lb 3.2 oz (73.6 kg)   SpO2 96%   BMI 27.84 kg/m  Wt Readings from Last 3 Encounters:  11/26/21 162 lb 3.2 oz (73.6 kg)  11/05/21 160 lb 1.9 oz (72.6 kg)  08/28/21 166 lb 12.8 oz (75.7 kg)    Lab Results  Component Value Date   TSH 1.509 09/15/2013   Lab Results  Component Value Date   WBC 6.4 12/02/2020   HGB 14.6 12/02/2020   HCT 43.3 12/02/2020   MCV 84 12/02/2020   PLT 303 12/02/2020   Lab Results  Component Value Date   NA 144 12/02/2020   K 4.3 12/02/2020   CO2 23 12/02/2020   GLUCOSE 92 12/02/2020   BUN 17 12/02/2020   CREATININE 1.21 12/02/2020   BILITOT 0.6 12/02/2020   ALKPHOS 60 12/02/2020   AST 24 12/02/2020   ALT 18 12/02/2020   PROT 6.2 12/02/2020   ALBUMIN 4.1 12/02/2020   CALCIUM 9.7 12/02/2020   ANIONGAP 11 09/22/2019   EGFR 64 12/02/2020   Lab Results  Component Value Date   CHOL 194 12/02/2020   Lab Results  Component Value Date   HDL 69 12/02/2020   Lab Results  Component Value Date   LDLCALC 117 (H) 12/02/2020   Lab Results  Component Value Date   TRIG 42 12/02/2020   Lab Results  Component Value Date   CHOLHDL 2.8 12/02/2020   No results found for: "HGBA1C"    Assessment & Plan:   Problem List Items Addressed This Visit       Cardiovascular and Mediastinum   Essential hypertension, benign - Primary    No orders of the defined types were placed in this encounter.   Follow-up: No follow-ups on file.    Lindell Spar, MD

## 2021-11-26 NOTE — Patient Instructions (Signed)
Please start taking Hydralazine 25 mg twice daily.  Please continue taking other medications as prescribed.  Please follow DASH diet and ambulate as tolerated.

## 2021-11-29 ENCOUNTER — Encounter: Payer: Self-pay | Admitting: Internal Medicine

## 2021-12-18 ENCOUNTER — Other Ambulatory Visit: Payer: Self-pay | Admitting: Internal Medicine

## 2021-12-18 DIAGNOSIS — I10 Essential (primary) hypertension: Secondary | ICD-10-CM

## 2021-12-24 ENCOUNTER — Ambulatory Visit (INDEPENDENT_AMBULATORY_CARE_PROVIDER_SITE_OTHER): Payer: Medicare HMO | Admitting: Internal Medicine

## 2021-12-24 ENCOUNTER — Encounter: Payer: Self-pay | Admitting: Internal Medicine

## 2021-12-24 VITALS — BP 120/68 | HR 66 | Ht 64.0 in | Wt 157.2 lb

## 2021-12-24 DIAGNOSIS — S70261A Insect bite (nonvenomous), right hip, initial encounter: Secondary | ICD-10-CM

## 2021-12-24 DIAGNOSIS — I10 Essential (primary) hypertension: Secondary | ICD-10-CM | POA: Diagnosis not present

## 2021-12-24 DIAGNOSIS — I350 Nonrheumatic aortic (valve) stenosis: Secondary | ICD-10-CM

## 2021-12-24 DIAGNOSIS — W57XXXA Bitten or stung by nonvenomous insect and other nonvenomous arthropods, initial encounter: Secondary | ICD-10-CM | POA: Diagnosis not present

## 2021-12-24 DIAGNOSIS — Z1159 Encounter for screening for other viral diseases: Secondary | ICD-10-CM

## 2021-12-24 DIAGNOSIS — K219 Gastro-esophageal reflux disease without esophagitis: Secondary | ICD-10-CM

## 2021-12-24 DIAGNOSIS — E782 Mixed hyperlipidemia: Secondary | ICD-10-CM | POA: Diagnosis not present

## 2021-12-24 MED ORDER — HYDRALAZINE HCL 25 MG PO TABS: 25.0000 mg | ORAL_TABLET | Freq: Two times a day (BID) | ORAL | 1 refills | Status: DC

## 2021-12-24 MED ORDER — HYDRALAZINE HCL 25 MG PO TABS: 25.0000 mg | ORAL_TABLET | Freq: Two times a day (BID) | ORAL | 0 refills | Status: DC

## 2021-12-24 MED ORDER — DOXYCYCLINE HYCLATE 100 MG PO TABS: 100.0000 mg | ORAL_TABLET | Freq: Two times a day (BID) | ORAL | 0 refills | Status: DC

## 2021-12-24 NOTE — Progress Notes (Signed)
Established Patient Office Visit  Subjective:  Patient ID: Adam [name], male    DOB: August 04, 1950  Age: 71 y.o. MRN: [id number]  CC:  Chief Complaint  Patient presents with   Follow-up    Following up, pt has bp reading, pt c/o tick bite that he found on his lower back on the right side yesterday.     HPI Adam [name] is a 71 y.o. male with past medical history of HTN, GERD, eosinophilic gastritis, BPH, HLD and depression who presents for f/u of his chronic medical conditions.  HTN: BP is well-controlled. Takes medications regularly. Patient denies headache, dizziness, chest pain, dyspnea or palpitations.  He complains of a rash over his lower back after a tick bite yesterday.  He has intense itching over the area.  Denies any fever, chills, nausea or vomiting currently.  He does not know the duration of attachment.    Past Medical History:  Diagnosis Date   Allergy    BMI 31.0-31.9,adult JUNE 2011 180 LBS    Chronic back pain    Depression    Eosinophilic granuloma ([place]) JAN 2011 GASTRIC, followed by Dr. Eugenia Pancoast   EGD JAN 2011, JUN 2011, NOV 2011   Gastric ulcer 08/31/2012   GERD (gastroesophageal reflux disease)    HTN (hypertension)    Hyperlipemia    IBS (irritable bowel syndrome)    Prostate hypertrophy     Past Surgical History:  Procedure Laterality Date   BACK SURGERY     COLONOSCOPY  JAN 2011 COMPLETE   hypoTN and bradycardia w/ Demerol ,Phenergan and Versed, [place] TICS, SML IH   COLONOSCOPY  2003   NUR-normal   COLONOSCOPY  06/21/09   Fields-sigmoid diverticulosis, sm int hemorrhoids   COLONOSCOPY N/A 10/24/2013   Normal mucosa in the terminal ileum Moderate diverticulosis noted in the sigmoid colonModerate sized internal hemorrhoids. negative random colon bx.. next TCS 10/2023   Cyst Removed     from Left leg and knee   ESOPHAGOGASTRODUODENOSCOPY  05/20/2012   SLF: MILD ESOPHAGITIS.NO BARRETT'S/Multiple ulcers ranging between 3-5 mm/MILD  Duodenal inflammation was found in the duodenal bulb   ESOPHAGOGASTRODUODENOSCOPY N/A 09/05/2012   SLF: MILD Non-erosive gastritis (inflammation) in the gastric antrum. Biopsies benign, no H. pylori.   ESOPHAGOGASTRODUODENOSCOPY N/A 10/24/2013   JJH:ERDEYCXK DISTAL ESOPHAGEAL WEBSmall hiatal herniaMILD Non-erosive gastritis. SB bx benign. gastric bx, minimal inflammation.    GANGLION CYST EXCISION     from L wrist and pins placed for Fx involving L thumb   HAND SURGERY  at the age of 52   Brother accidentally cut it   MALONEY DILATION N/A 10/24/2013   Procedure: Venia Minks DILATION;  Surgeon: Danie Binder, MD;  [facility];  Service: Endoscopy;  Laterality: N/A;   MASS EXCISION  04/29/2012   Procedure: EXCISION MASS;  Surgeon: Donato Heinz, MD;  Location: [facility];  Service: General;  Laterality: Right;  Excision of Vascular Mass Right Arm   SAVORY DILATION N/A 10/24/2013   Procedure: SAVORY DILATION;  Surgeon: Danie Binder, MD;  [facility];  Service: Endoscopy;  Laterality: N/A;   SHOULDER SURGERY Left 2017   TUMOR REMOVAL  1994   Benign from brain at [place]   GASTRIC ULCER-EOS GRANULOMA   UPPER GASTROINTESTINAL ENDOSCOPY  JUN 2011   GASTRIC ULCER- EOS, NO H. PYLORI   UPPER GASTROINTESTINAL ENDOSCOPY  NOV 2011   GASTRIC NODULE, no ulcer-  EOS    Family History  Problem Relation Age of Onset   Hypertension Mother    Diabetes Mother    Hyperlipidemia Mother    Thyroid disease Mother    Hypertension Father    Diabetes Father    Stroke Father    Colon cancer Maternal Grandmother        OVER 11    Social History   Socioeconomic History   Marital status: Married    Spouse name: [name]   Number of children: 1   Years of education: 12   Highest education level: Not on file  Occupational History   Occupation: disabled  Tobacco Use   Smoking status: Former    Packs/day: 0.25    Years: 15.00     Total pack years: 3.75    Types: Cigars, Cigarettes    Quit date: 04/29/2002    Years since quitting: 19.6   Smokeless tobacco: Never  Substance and Sexual Activity   Alcohol use: Not Currently   Drug use: No   Sexual activity: Yes    Birth control/protection: None  Other Topics Concern   Not on file  Social History Narrative   Lives w/ wife   Caffeine use: 1 cup coffee every morning   Social Determinants of Health   Financial Resource Strain: Low Risk  (05/07/2021)   Overall Financial Resource Strain (CARDIA)    Difficulty of Paying Living Expenses: Not hard at all  Food Insecurity: No Food Insecurity (05/07/2021)   Hunger Vital Sign    Worried About Running Out of Food in the Last Year: Never true    [place] in the Last Year: Never true  Transportation Needs: No Transportation Needs (05/07/2021)   PRAPARE - Hydrologist (Medical): No    Lack of Transportation (Non-Medical): No  Physical Activity: Sufficiently Active (05/07/2021)   Exercise Vital Sign    Days of Exercise per Week: 5 days    Minutes of Exercise per Session: 30 min  Stress: No Stress Concern Present (05/07/2021)   [place]    Feeling of Stress : Not at all  Social Connections: Moderately Integrated (05/07/2021)   Social Connection and Isolation Panel [NHANES]    Frequency of Communication with Friends and Family: Once a week    Frequency of Social Gatherings with Friends and Family: More than three times a week    Attends Religious Services: More than 4 times per year    Active Member of [employer] or Organizations: No    Attends Archivist Meetings: Never    Marital Status: Married  Human resources officer Violence: Not At Risk (05/07/2021)   Humiliation, Afraid, Rape, and Kick questionnaire    Fear of Current or Ex-Partner: No    Emotionally Abused: No    Physically Abused: No    Sexually Abused: No     Outpatient Medications Prior to Visit  Medication Sig Dispense Refill   doxazosin (CARDURA) 8 MG tablet TAKE 1 TABLET AT BEDTIME 90 tablet 1   enalapril (VASOTEC) 20 MG tablet TAKE 1 TABLET EVERY DAY 90 tablet 1   loratadine (CLARITIN) 10 MG tablet Take 10 mg by mouth daily as needed for allergies.      pantoprazole (PROTONIX) 40 MG tablet TAKE 1 TABLET TWICE DAILY 180 tablet 1   pravastatin (PRAVACHOL) 20 MG tablet TAKE 1 TABLET AT BEDTIME (NEED MD APPOINTMENT) 90 tablet 1   verapamil (CALAN-SR)  180 MG CR tablet TAKE 1 TABLET TWICE DAILY 180 tablet 1   hydrALAZINE (APRESOLINE) 25 MG tablet TAKE 1 TABLET (25 MG) BY MOUTH IN THE MORNING AND AT BEDTIME 60 tablet 0   No facility-administered medications prior to visit.    Allergies  Allergen Reactions   Sulfonamide Derivatives     REACTION: causes hives    ROS Review of Systems  Constitutional:  Negative for chills and fever.  HENT:  Negative for congestion and sore throat.   Eyes:  Negative for pain and discharge.  Respiratory:  Negative for cough and shortness of breath.   Cardiovascular:  Negative for chest pain and palpitations.  Gastrointestinal:  Negative for constipation, diarrhea, nausea and vomiting.  Endocrine: Negative for polydipsia and polyuria.  Genitourinary:  Negative for dysuria and hematuria.  Musculoskeletal:  Positive for arthralgias. Negative for neck pain and neck stiffness.  Skin:  Positive for rash.  Neurological:  Negative for dizziness, weakness, numbness and headaches.  Psychiatric/Behavioral:  Negative for agitation and behavioral problems.       Objective:    Physical Exam Vitals reviewed.  Constitutional:      General: He is not in acute distress.    Appearance: He is not diaphoretic.  HENT:     Head: Normocephalic and atraumatic.     Nose: Nose normal.     Mouth/Throat:     Mouth: Mucous membranes are moist.  Eyes:     General: No scleral icterus.    Extraocular Movements:  Extraocular movements intact.  Cardiovascular:     Rate and Rhythm: Normal rate and regular rhythm.     Pulses: Normal pulses.     Heart sounds: Murmur heard.  Pulmonary:     Breath sounds: Normal breath sounds. No wheezing or rales.  Musculoskeletal:     Cervical back: Neck supple. No tenderness.     Right lower leg: No edema.     Left lower leg: No edema.  Skin:    General: Skin is warm.     Findings: Rash (Erythematous patch, circular, about 2 cm in diameter) present.  Neurological:     General: No focal deficit present.     Mental Status: He is alert and oriented to person, place, and time.  Psychiatric:        Mood and Affect: Mood normal.        Behavior: Behavior normal.     BP 120/68 (BP Location: Left Arm)   Pulse 66   Ht 5' 4"  (1.626 m)   Wt 157 lb 3.2 oz (71.3 kg)   SpO2 97%   BMI 26.98 kg/m  Wt Readings from Last 3 Encounters:  12/24/21 157 lb 3.2 oz (71.3 kg)  11/26/21 162 lb 3.2 oz (73.6 kg)  11/05/21 160 lb 1.9 oz (72.6 kg)    Lab Results  Component Value Date   TSH 1.509 09/15/2013   Lab Results  Component Value Date   WBC 6.4 12/02/2020   HGB 14.6 12/02/2020   HCT 43.3 12/02/2020   MCV 84 12/02/2020   PLT 303 12/02/2020   Lab Results  Component Value Date   NA 144 12/02/2020   K 4.3 12/02/2020   CO2 23 12/02/2020   GLUCOSE 92 12/02/2020   BUN 17 12/02/2020   CREATININE 1.21 12/02/2020   BILITOT 0.6 12/02/2020   ALKPHOS 60 12/02/2020   AST 24 12/02/2020   ALT 18 12/02/2020   PROT 6.2 12/02/2020   ALBUMIN 4.1 12/02/2020   CALCIUM  9.7 12/02/2020   ANIONGAP 11 09/22/2019   EGFR 64 12/02/2020   Lab Results  Component Value Date   CHOL 194 12/02/2020   Lab Results  Component Value Date   HDL 69 12/02/2020   Lab Results  Component Value Date   LDLCALC 117 (H) 12/02/2020   Lab Results  Component Value Date   TRIG 42 12/02/2020   Lab Results  Component Value Date   CHOLHDL 2.8 12/02/2020   No results found for: "HGBA1C"     Assessment & Plan:   Problem List Items Addressed This Visit       Cardiovascular and Mediastinum   Essential hypertension, benign - Primary    BP Readings from Last 1 Encounters:  12/24/21 120/68  Well-controlled with Verapamil 180 mg BID, Enalapril 20 mg QD, and Hydralazine 25 mg BID Counseled for compliance with the medications Advised DASH diet and moderate exercise/walking, at least 150 mins/week      Relevant Medications   hydrALAZINE (APRESOLINE) 25 MG tablet   Nonrheumatic aortic valve stenosis    Echo reviewed, asymptomatic currently Will refer to cardiology if he has dyspnea, palpitations, LE swelling or dizziness      Relevant Medications   hydrALAZINE (APRESOLINE) 25 MG tablet     Digestive   GERD (gastroesophageal reflux disease)    Well controlled, on Pantoprazole        Musculoskeletal and Integument   Tick bite of right hip    Unknown duration of tick attachment Has erythematous patch over right sided lower back Started empiric doxycycline      Relevant Medications   doxycycline (VIBRA-TABS) 100 MG tablet     Other   Hyperlipidemia    On Pravastatin      Relevant Medications   hydrALAZINE (APRESOLINE) 25 MG tablet   Other Visit Diagnoses     Need for hepatitis C screening test       Relevant Orders   Hepatitis C Antibody       Meds ordered this encounter  Medications   doxycycline (VIBRA-TABS) 100 MG tablet    Sig: Take 1 tablet (100 mg total) by mouth 2 (two) times daily.    Dispense:  14 tablet    Refill:  0   DISCONTD: hydrALAZINE (APRESOLINE) 25 MG tablet    Sig: Take 1 tablet (25 mg total) by mouth 2 (two) times daily.    Dispense:  60 tablet    Refill:  0   DISCONTD: hydrALAZINE (APRESOLINE) 25 MG tablet    Sig: Take 1 tablet (25 mg total) by mouth 2 (two) times daily.    Dispense:  180 tablet    Refill:  1   hydrALAZINE (APRESOLINE) 25 MG tablet    Sig: Take 1 tablet (25 mg total) by mouth 2 (two) times daily.     Dispense:  180 tablet    Refill:  1    Follow-up: Return if symptoms worsen or fail to improve.    Lindell Spar, MD

## 2021-12-24 NOTE — Patient Instructions (Signed)
Please take Doxycycline as prescribed for tick bite.  Please continue taking other medications as prescribed.  Please continue to follow low salt diet and ambulate as tolerated.

## 2021-12-26 NOTE — Assessment & Plan Note (Signed)
BP Readings from Last 1 Encounters:  12/24/21 120/68   Well-controlled with Verapamil 180 mg BID, Enalapril 20 mg QD, and Hydralazine 25 mg BID Counseled for compliance with the medications Advised DASH diet and moderate exercise/walking, at least 150 mins/week

## 2021-12-26 NOTE — Assessment & Plan Note (Signed)
Echo reviewed, asymptomatic currently Will refer to cardiology if he has dyspnea, palpitations, LE swelling or dizziness

## 2021-12-26 NOTE — Assessment & Plan Note (Signed)
Unknown duration of tick attachment Has erythematous patch over right sided lower back Started empiric doxycycline

## 2021-12-26 NOTE — Assessment & Plan Note (Signed)
Well-controlled, on Pantoprazole. 

## 2021-12-26 NOTE — Assessment & Plan Note (Signed)
On Pravastatin 

## 2022-02-11 DIAGNOSIS — M1711 Unilateral primary osteoarthritis, right knee: Secondary | ICD-10-CM | POA: Diagnosis not present

## 2022-03-02 DIAGNOSIS — N400 Enlarged prostate without lower urinary tract symptoms: Secondary | ICD-10-CM | POA: Diagnosis not present

## 2022-03-02 DIAGNOSIS — Z0000 Encounter for general adult medical examination without abnormal findings: Secondary | ICD-10-CM | POA: Diagnosis not present

## 2022-03-02 DIAGNOSIS — E559 Vitamin D deficiency, unspecified: Secondary | ICD-10-CM | POA: Diagnosis not present

## 2022-03-03 LAB — CBC WITH DIFFERENTIAL/PLATELET
Basophils Absolute: 0.1 10*3/uL (ref 0.0–0.2)
Basos: 1 %
EOS (ABSOLUTE): 0.2 10*3/uL (ref 0.0–0.4)
Eos: 2 %
Hematocrit: 41.5 % (ref 37.5–51.0)
Hemoglobin: 13.6 g/dL (ref 13.0–17.7)
Immature Grans (Abs): 0 10*3/uL (ref 0.0–0.1)
Immature Granulocytes: 0 %
Lymphocytes Absolute: 1.3 10*3/uL (ref 0.7–3.1)
Lymphs: 15 %
MCH: 28.3 pg (ref 26.6–33.0)
MCHC: 32.8 g/dL (ref 31.5–35.7)
MCV: 87 fL (ref 79–97)
Monocytes Absolute: 0.6 10*3/uL (ref 0.1–0.9)
Monocytes: 7 %
Neutrophils Absolute: 6.2 10*3/uL (ref 1.4–7.0)
Neutrophils: 75 %
Platelets: 265 10*3/uL (ref 150–450)
RBC: 4.8 x10E6/uL (ref 4.14–5.80)
RDW: 13.8 % (ref 11.6–15.4)
WBC: 8.3 10*3/uL (ref 3.4–10.8)

## 2022-03-03 LAB — CMP14+EGFR
ALT: 15 IU/L (ref 0–44)
AST: 17 IU/L (ref 0–40)
Albumin/Globulin Ratio: 2.9 — ABNORMAL HIGH (ref 1.2–2.2)
Albumin: 4.4 g/dL (ref 3.8–4.8)
Alkaline Phosphatase: 51 IU/L (ref 44–121)
BUN/Creatinine Ratio: 17 (ref 10–24)
BUN: 18 mg/dL (ref 8–27)
Bilirubin Total: 0.7 mg/dL (ref 0.0–1.2)
CO2: 22 mmol/L (ref 20–29)
Calcium: 9.8 mg/dL (ref 8.6–10.2)
Chloride: 104 mmol/L (ref 96–106)
Creatinine, Ser: 1.09 mg/dL (ref 0.76–1.27)
Globulin, Total: 1.5 g/dL (ref 1.5–4.5)
Glucose: 79 mg/dL (ref 70–99)
Potassium: 4.4 mmol/L (ref 3.5–5.2)
Sodium: 141 mmol/L (ref 134–144)
Total Protein: 5.9 g/dL — ABNORMAL LOW (ref 6.0–8.5)
eGFR: 72 mL/min/{1.73_m2} (ref >59)

## 2022-03-03 LAB — PSA: Prostate Specific Ag, Serum: 2.3 ng/mL (ref 0.0–4.0)

## 2022-03-03 LAB — VITAMIN D 25 HYDROXY (VIT D DEFICIENCY, FRACTURES): Vit D, 25-Hydroxy: 23.1 ng/mL — ABNORMAL LOW (ref 30.0–100.0)

## 2022-03-03 LAB — HEMOGLOBIN A1C
Est. average glucose Bld gHb Est-mCnc: 108 mg/dL
Hgb A1c MFr Bld: 5.4 % (ref 4.8–5.6)

## 2022-03-03 LAB — LIPID PANEL
Chol/HDL Ratio: 2.4 ratio (ref 0.0–5.0)
Cholesterol, Total: 178 mg/dL (ref 100–199)
HDL: 75 mg/dL (ref >39)
LDL Chol Calc (NIH): 93 mg/dL (ref 0–99)
Triglycerides: 52 mg/dL (ref 0–149)
VLDL Cholesterol Cal: 10 mg/dL (ref 5–40)

## 2022-03-03 LAB — TSH: TSH: 1.27 u[IU]/mL (ref 0.450–4.500)

## 2022-03-09 ENCOUNTER — Encounter: Payer: Self-pay | Admitting: Internal Medicine

## 2022-03-09 ENCOUNTER — Ambulatory Visit (INDEPENDENT_AMBULATORY_CARE_PROVIDER_SITE_OTHER): Payer: Medicare HMO | Admitting: Internal Medicine

## 2022-03-09 VITALS — BP 136/64 | HR 53 | Resp 18 | Ht 64.0 in | Wt 166.0 lb

## 2022-03-09 DIAGNOSIS — E559 Vitamin D deficiency, unspecified: Secondary | ICD-10-CM

## 2022-03-09 DIAGNOSIS — Z23 Encounter for immunization: Secondary | ICD-10-CM | POA: Diagnosis not present

## 2022-03-09 DIAGNOSIS — I10 Essential (primary) hypertension: Secondary | ICD-10-CM

## 2022-03-09 DIAGNOSIS — N400 Enlarged prostate without lower urinary tract symptoms: Secondary | ICD-10-CM | POA: Diagnosis not present

## 2022-03-09 DIAGNOSIS — Z1159 Encounter for screening for other viral diseases: Secondary | ICD-10-CM | POA: Diagnosis not present

## 2022-03-09 DIAGNOSIS — M1711 Unilateral primary osteoarthritis, right knee: Secondary | ICD-10-CM | POA: Diagnosis not present

## 2022-03-09 DIAGNOSIS — E782 Mixed hyperlipidemia: Secondary | ICD-10-CM

## 2022-03-09 DIAGNOSIS — Z0001 Encounter for general adult medical examination with abnormal findings: Secondary | ICD-10-CM

## 2022-03-09 NOTE — Assessment & Plan Note (Signed)
Advised to take Vit D 2000 IU QD

## 2022-03-09 NOTE — Patient Instructions (Addendum)
Please start taking Vitamin D 2000 IU once daily.  Please continue taking medications as prescribed.  Please continue to follow low salt diet and perform moderate exercise/walking at least 150 mins/week.

## 2022-03-09 NOTE — Assessment & Plan Note (Signed)
On Doxazosin PSA wnl

## 2022-03-09 NOTE — Progress Notes (Signed)
Established Patient Office Visit  Subjective:  Patient ID: Adam [name], male    DOB: 06-07-1951  Age: 70 y.o. MRN: [id number]  CC:  Chief Complaint  Patient presents with   Annual Exam    Annual exam    HPI Adam [name] is a 71 y.o. male with past medical history of HTN, GERD, eosinophilic gastritis, BPH, HLD and depression who presents for annual physical.  HTN: BP is well-controlled. Takes medications - Enalapril, Verapamil and Hydralazine regularly. Patient denies headache, dizziness, chest pain, dyspnea or palpitations.  GERD: He takes Pantoprazole for it. He denies any dysphagia or odynophagia currently.   BPH: He takes Doxazosin. Denies any dysuria or hematuria.       Past Medical History:  Diagnosis Date   Allergy    BMI 31.0-31.9,adult JUNE 2011 180 LBS    Chronic back pain    Depression    Eosinophilic granuloma ([place]) JAN 2011 GASTRIC, followed by Dr. Eugenia Pancoast   EGD JAN 2011, JUN 2011, NOV 2011   Gastric ulcer 08/31/2012   GERD (gastroesophageal reflux disease)    HTN (hypertension)    Hyperlipemia    IBS (irritable bowel syndrome)    Prostate hypertrophy     Past Surgical History:  Procedure Laterality Date   BACK SURGERY     COLONOSCOPY  JAN 2011 COMPLETE   hypoTN and bradycardia w/ Demerol ,Phenergan and Versed, [place] TICS, SML IH   COLONOSCOPY  2003   NUR-normal   COLONOSCOPY  06/21/09   Fields-sigmoid diverticulosis, sm int hemorrhoids   COLONOSCOPY N/A 10/24/2013   Normal mucosa in the terminal ileum Moderate diverticulosis noted in the sigmoid colonModerate sized internal hemorrhoids. negative random colon bx.. next TCS 10/2023   Cyst Removed     from Left leg and knee   ESOPHAGOGASTRODUODENOSCOPY  05/20/2012   SLF: MILD ESOPHAGITIS.NO BARRETT'S/Multiple ulcers ranging between 3-5 mm/MILD Duodenal inflammation was found in the duodenal bulb   ESOPHAGOGASTRODUODENOSCOPY N/A 09/05/2012   SLF: MILD Non-erosive gastritis (inflammation)  in the gastric antrum. Biopsies benign, no H. pylori.   ESOPHAGOGASTRODUODENOSCOPY N/A 10/24/2013   EHU:DJSHFWYO DISTAL ESOPHAGEAL WEBSmall hiatal herniaMILD Non-erosive gastritis. SB bx benign. gastric bx, minimal inflammation.    GANGLION CYST EXCISION     from L wrist and pins placed for Fx involving L thumb   HAND SURGERY  at the age of 5   Brother accidentally cut it   MALONEY DILATION N/A 10/24/2013   Procedure: Venia Minks DILATION;  Surgeon: Danie Binder, MD;  [facility];  Service: Endoscopy;  Laterality: N/A;   MASS EXCISION  04/29/2012   Procedure: EXCISION MASS;  Surgeon: Donato Heinz, MD;  Location: [facility];  Service: General;  Laterality: Right;  Excision of Vascular Mass Right Arm   SAVORY DILATION N/A 10/24/2013   Procedure: SAVORY DILATION;  Surgeon: Danie Binder, MD;  Location: [facility];  Service: Endoscopy;  Laterality: N/A;   SHOULDER SURGERY Left 2017   TUMOR REMOVAL  1994   Benign from brain at [place]   GASTRIC ULCER-EOS GRANULOMA   UPPER GASTROINTESTINAL ENDOSCOPY  JUN 2011   GASTRIC ULCER- EOS, NO H. PYLORI   UPPER GASTROINTESTINAL ENDOSCOPY  NOV 2011   GASTRIC NODULE, no ulcer- EOS    Family History  Problem Relation Age of Onset   Hypertension Mother    Diabetes Mother    Hyperlipidemia Mother    Thyroid disease Mother  Hypertension Father    Diabetes Father    Stroke Father    Colon cancer Maternal Grandmother        OVER 45    Social History   Socioeconomic History   Marital status: Married    Spouse name: [name]   Number of children: 1   Years of education: 12   Highest education level: Not on file  Occupational History   Occupation: disabled  Tobacco Use   Smoking status: Former    Packs/day: 0.25    Years: 15.00    Total pack years: 3.75    Types: Cigars, Cigarettes    Quit date: 04/29/2002    Years since quitting: 19.8   Smokeless tobacco: Never  Substance  and Sexual Activity   Alcohol use: Not Currently   Drug use: No   Sexual activity: Yes    Birth control/protection: None  Other Topics Concern   Not on file  Social History Narrative   Lives w/ wife   Caffeine use: 1 cup coffee every morning   Social Determinants of Health   Financial Resource Strain: Low Risk  (05/07/2021)   Overall Financial Resource Strain (CARDIA)    Difficulty of Paying Living Expenses: Not hard at all  Food Insecurity: No Food Insecurity (05/07/2021)   Hunger Vital Sign    Worried About Running Out of Food in the Last Year: Never true    Ran Out of Food in the Last Year: Never true  Transportation Needs: No Transportation Needs (05/07/2021)   PRAPARE - Hydrologist (Medical): No    Lack of Transportation (Non-Medical): No  Physical Activity: Sufficiently Active (05/07/2021)   Exercise Vital Sign    Days of Exercise per Week: 5 days    Minutes of Exercise per Session: 30 min  Stress: No Stress Concern Present (05/07/2021)   [place]    Feeling of Stress : Not at all  Social Connections: Moderately Integrated (05/07/2021)   Social Connection and Isolation Panel [NHANES]    Frequency of Communication with Friends and Family: Once a week    Frequency of Social Gatherings with Friends and Family: More than three times a week    Attends Religious Services: More than 4 times per year    Active Member of [employer] or Organizations: No    Attends Archivist Meetings: Never    Marital Status: Married  Human resources officer Violence: Not At Risk (05/07/2021)   Humiliation, Afraid, Rape, and Kick questionnaire    Fear of Current or Ex-Partner: No    Emotionally Abused: No    Physically Abused: No    Sexually Abused: No    Outpatient Medications Prior to Visit  Medication Sig Dispense Refill   doxazosin (CARDURA) 8 MG tablet TAKE 1 TABLET AT BEDTIME 90 tablet 1    enalapril (VASOTEC) 20 MG tablet TAKE 1 TABLET EVERY DAY 90 tablet 1   hydrALAZINE (APRESOLINE) 25 MG tablet Take 1 tablet (25 mg total) by mouth 2 (two) times daily. 180 tablet 1   loratadine (CLARITIN) 10 MG tablet Take 10 mg by mouth daily as needed for allergies.      meloxicam (MOBIC) 15 MG tablet Take 15 mg by mouth 3 (three) times daily.     pantoprazole (PROTONIX) 40 MG tablet TAKE 1 TABLET TWICE DAILY 180 tablet 1   pravastatin (PRAVACHOL) 20 MG tablet TAKE 1 TABLET AT BEDTIME (NEED MD APPOINTMENT) 90 tablet  1   verapamil (CALAN-SR) 180 MG CR tablet TAKE 1 TABLET TWICE DAILY 180 tablet 1   doxycycline (VIBRA-TABS) 100 MG tablet Take 1 tablet (100 mg total) by mouth 2 (two) times daily. (Patient not taking: Reported on 03/09/2022) 14 tablet 0   No facility-administered medications prior to visit.    Allergies  Allergen Reactions   Sulfonamide Derivatives     REACTION: causes hives    ROS Review of Systems  Constitutional:  Negative for chills and fever.  HENT:  Negative for congestion and sore throat.   Eyes:  Negative for pain and discharge.  Respiratory:  Negative for cough and shortness of breath.   Cardiovascular:  Negative for chest pain and palpitations.  Gastrointestinal:  Negative for constipation, diarrhea, nausea and vomiting.  Endocrine: Negative for polydipsia and polyuria.  Genitourinary:  Negative for dysuria and hematuria.  Musculoskeletal:  Negative for neck pain and neck stiffness.  Skin:  Negative for rash.  Neurological:  Negative for dizziness, weakness, numbness and headaches.  Psychiatric/Behavioral:  Negative for agitation and behavioral problems.       Objective:    Physical Exam Vitals reviewed.  Constitutional:      General: He is not in acute distress.    Appearance: He is not diaphoretic.  HENT:     Head: Normocephalic and atraumatic.     Nose: Nose normal.     Mouth/Throat:     Mouth: Mucous membranes are moist.  Eyes:      General: No scleral icterus.    Extraocular Movements: Extraocular movements intact.  Cardiovascular:     Rate and Rhythm: Normal rate and regular rhythm.     Pulses: Normal pulses.     Heart sounds: Murmur (Systolic) heard.  Pulmonary:     Breath sounds: Normal breath sounds. No wheezing or rales.  Abdominal:     Palpations: Abdomen is soft.     Tenderness: There is no abdominal tenderness.  Musculoskeletal:     Cervical back: Neck supple. No tenderness.     Right lower leg: No edema.     Left lower leg: No edema.     Comments: Partially amputated fingers of right hand  Skin:    General: Skin is warm.     Findings: No rash.  Neurological:     General: No focal deficit present.     Mental Status: He is alert and oriented to person, place, and time.     Cranial Nerves: No cranial nerve deficit.     Sensory: No sensory deficit.     Motor: No weakness.  Psychiatric:        Mood and Affect: Mood normal.        Behavior: Behavior normal.     BP 136/64 (BP Location: Right Arm, Patient Position: Sitting, Cuff Size: Normal)   Pulse (!) 53   Resp 18   Ht $R'5\' 4"'jO$  (1.626 m)   Wt 166 lb (75.3 kg)   SpO2 96%   BMI 28.49 kg/m  Wt Readings from Last 3 Encounters:  03/09/22 166 lb (75.3 kg)  12/24/21 157 lb 3.2 oz (71.3 kg)  11/26/21 162 lb 3.2 oz (73.6 kg)    Lab Results  Component Value Date   TSH 1.270 03/02/2022   Lab Results  Component Value Date   WBC 8.3 03/02/2022   HGB 13.6 03/02/2022   HCT 41.5 03/02/2022   MCV 87 03/02/2022   PLT 265 03/02/2022   Lab Results  Component Value Date  NA 141 03/02/2022   K 4.4 03/02/2022   CO2 22 03/02/2022   GLUCOSE 79 03/02/2022   BUN 18 03/02/2022   CREATININE 1.09 03/02/2022   BILITOT 0.7 03/02/2022   ALKPHOS 51 03/02/2022   AST 17 03/02/2022   ALT 15 03/02/2022   PROT 5.9 (L) 03/02/2022   ALBUMIN 4.4 03/02/2022   CALCIUM 9.8 03/02/2022   ANIONGAP 11 09/22/2019   EGFR 72 03/02/2022   Lab Results  Component Value  Date   CHOL 178 03/02/2022   Lab Results  Component Value Date   HDL 75 03/02/2022   Lab Results  Component Value Date   LDLCALC 93 03/02/2022   Lab Results  Component Value Date   TRIG 52 03/02/2022   Lab Results  Component Value Date   CHOLHDL 2.4 03/02/2022   Lab Results  Component Value Date   HGBA1C 5.4 03/02/2022      Assessment & Plan:   Problem List Items Addressed This Visit       Cardiovascular and Mediastinum   Essential hypertension, benign    BP Readings from Last 1 Encounters:  03/09/22 136/64  Well-controlled with Verapamil 180 mg BID, Enalapril 20 mg QD, and Hydralazine 25 mg BID Counseled for compliance with the medications Advised DASH diet and moderate exercise/walking, at least 150 mins/week      Relevant Orders   Basic Metabolic Panel (BMET)     Musculoskeletal and Integument   OA (osteoarthritis) of knee    On Mobic Advised to take Tylenol arthritis and Mobic alternatively Followed by [facility]      Relevant Medications   meloxicam (MOBIC) 15 MG tablet     Genitourinary   BPH (benign prostatic hyperplasia)    On Doxazosin PSA wnl        Other   Hyperlipidemia    On Pravastatin      Encounter for general adult medical examination with abnormal findings - Primary    Physical exam as documented. Counseling done  re healthy lifestyle involving commitment to 150 minutes exercise per week, heart healthy diet, and attaining healthy weight.The importance of adequate sleep also discussed. Changes in health habits are decided on by the patient with goals and time frames  set for achieving them. Immunization and cancer screening needs are specifically addressed at this visit.      Vitamin D deficiency    Advised to take Vit D 2000 IU QD      Other Visit Diagnoses     Need for immunization against influenza       Relevant Orders   Flu Vaccine QUAD High Dose(Fluad) (Completed)   Need for hepatitis C screening test        Relevant Orders   Hepatitis C Antibody       No orders of the defined types were placed in this encounter.   Follow-up: Return in about 6 months (around 09/08/2022) for HTN and HLD.    Lindell Spar, MD

## 2022-03-09 NOTE — Assessment & Plan Note (Addendum)
On Mobic Advised to take Tylenol arthritis and Mobic alternatively Followed by [facility]

## 2022-03-09 NOTE — Assessment & Plan Note (Signed)

## 2022-03-09 NOTE — Assessment & Plan Note (Signed)
BP Readings from Last 1 Encounters:  03/09/22 136/64   Well-controlled with Verapamil 180 mg BID, Enalapril 20 mg QD, and Hydralazine 25 mg BID Counseled for compliance with the medications Advised DASH diet and moderate exercise/walking, at least 150 mins/week

## 2022-03-09 NOTE — Assessment & Plan Note (Signed)
On Pravastatin 

## 2022-05-15 ENCOUNTER — Encounter: Payer: Self-pay | Admitting: Internal Medicine

## 2022-05-15 ENCOUNTER — Ambulatory Visit (INDEPENDENT_AMBULATORY_CARE_PROVIDER_SITE_OTHER): Payer: Medicare HMO | Admitting: Internal Medicine

## 2022-05-15 DIAGNOSIS — J189 Pneumonia, unspecified organism: Secondary | ICD-10-CM | POA: Diagnosis not present

## 2022-05-15 DIAGNOSIS — J069 Acute upper respiratory infection, unspecified: Secondary | ICD-10-CM

## 2022-05-15 MED ORDER — AZITHROMYCIN 250 MG PO TABS: ORAL_TABLET | ORAL | 0 refills | Status: DC

## 2022-05-15 NOTE — Progress Notes (Signed)
   Acute Telephone Visit  Virtual Visit via Telephone Note  I connected with [name] on 05/15/22 at 11:20 AM EST by telephone and verified that I am speaking with the correct person using two identifiers.  Chief Complaint  Patient presents with   Sinus Problem    Cough, hard to swallow, headache, night sweats for about 2-3 days (05/12/2022). Taking cough and tylenol OTC. Will do home COVID test.   Location: Patient: [place], [place] [address] Provider: [address], [place], [place] [address]   I discussed the limitations, risks, security and privacy concerns of performing an evaluation and management service by telephone and the availability of in person appointments. I also discussed with the patient that there may be a patient responsible charge related to this service. The patient expressed understanding and agreed to proceed.   History of Present Illness:  Mr. Hoctor has been evaluated today for an acute visit via telephone encounter for a 3-day history of cough, headache, night sweats, and throat irritation.  He currently endorses a cough productive of thick, cloudy colored phlegm.  He endorses a bilateral frontal headache as well.  Mr. Grisby denies fever/chills, but reports that he has woken up drenched in sweat the past 2 nights.  He has experienced throat irritation as well.  He currently denies shortness of breath, nasal congestion, and sinus pain/pressure.  He has been managing his symptoms with Robitussin and Tylenol.  Overall he believes his symptoms are getting worse.  He took a home COVID-19 test this morning that was negative.  Mr. Stettner has received his influenza vaccination this year.  He received 2 COVID-19 vaccines but has not received any booster vaccinations.  He is unaware of any recent sick contacts.   Assessment and Plan:  Pneumonia, unspecified Evaluated today via telephone encounter for the symptoms endorsed above.  He has a cough productive of  thick, cloudy sputum and recent night sweats.  His home COVID test was negative. -I have recommended COVID/influenza testing to rule out viral infection.  Given his age and symptom duration, he would qualify for outpatient treatment if positive. -In the event of a negative COVID/positive test result, I have prescribed azithromycin x 5 days for treatment of pneumonia -I have recommended that he stay well-hydrated and continue as needed use of Robitussin and Tylenol  Follow Up Instructions:  I discussed the assessment and treatment plan with the patient. The patient was provided an opportunity to ask questions and all were answered. The patient agreed with the plan and demonstrated an understanding of the instructions.   The patient was advised to call back or seek an in-person evaluation if the symptoms worsen or if the condition fails to improve as anticipated.  I provided 6 minutes of non-face-to-face time during this encounter.   Johnette Abraham, MD

## 2022-05-19 ENCOUNTER — Other Ambulatory Visit: Payer: Self-pay | Admitting: Internal Medicine

## 2022-05-19 ENCOUNTER — Ambulatory Visit (INDEPENDENT_AMBULATORY_CARE_PROVIDER_SITE_OTHER): Payer: Medicare HMO

## 2022-05-19 DIAGNOSIS — Z0000 Encounter for general adult medical examination without abnormal findings: Secondary | ICD-10-CM

## 2022-05-19 DIAGNOSIS — J069 Acute upper respiratory infection, unspecified: Secondary | ICD-10-CM

## 2022-05-19 DIAGNOSIS — I10 Essential (primary) hypertension: Secondary | ICD-10-CM

## 2022-05-19 LAB — COVID-19, FLU A+B NAA
Influenza A, NAA: NOT DETECTED
Influenza B, NAA: NOT DETECTED
SARS-CoV-2, NAA: NOT DETECTED

## 2022-05-19 MED ORDER — BENZONATATE 100 MG PO CAPS: 100.0000 mg | ORAL_CAPSULE | Freq: Two times a day (BID) | ORAL | 0 refills | Status: DC | PRN

## 2022-05-19 NOTE — Patient Instructions (Signed)
Adam [name] , Thank you for taking time to come for your Medicare Wellness Visit. I appreciate your ongoing commitment to your health goals. Please review the following plan we discussed and let me know if I can assist you in the future.   Screening recommendations/referrals: Recommended yearly ophthalmology/optometry visit for glaucoma screening and checkup Recommended yearly dental visit for hygiene and checkup  Vaccinations: Influenza vaccine: Completed Pneumococcal vaccine: Completed Tdap vaccine: completed Shingles vaccine: Completed    Advanced directives: patient declined. Advised to let us know if he needs information on how to get one  Conditions/risks identified: falls. Hypertension  Next appointment: 1 year  Preventive Care 55 Years and Older, Male Preventive care refers to lifestyle choices and visits with your health care provider that can promote health and wellness. What does preventive care include? A yearly physical exam. This is also called an annual well check. Dental exams once or twice a year. Routine eye exams. Ask your health care provider how often you should have your eyes checked. Personal lifestyle choices, including: Daily care of your teeth and gums. Regular physical activity. Eating a healthy diet. Avoiding tobacco and drug use. Limiting alcohol use. Practicing safe sex. Taking low doses of aspirin every day. Taking vitamin and mineral supplements as recommended by your health care provider. What happens during an annual well check? The services and screenings done by your health care provider during your annual well check will depend on your age, overall health, lifestyle risk factors, and family history of disease. Counseling  Your health care provider may ask you questions about your: Alcohol use. Tobacco use. Drug use. Emotional well-being. Home and relationship well-being. Sexual activity. Eating habits. History of falls. Memory and  ability to understand (cognition). Work and work Statistician. Screening  You may have the following tests or measurements: Height, weight, and BMI. Blood pressure. Lipid and cholesterol levels. These may be checked every 5 years, or more frequently if you are over 35 years old. Skin check. Lung cancer screening. You may have this screening every year starting at age 59 if you have a 30-pack-year history of smoking and currently smoke or have quit within the past 15 years. Fecal occult blood test (FOBT) of the stool. You may have this test every year starting at age 52. Flexible sigmoidoscopy or colonoscopy. You may have a sigmoidoscopy every 5 years or a colonoscopy every 10 years starting at age 26. Prostate cancer screening. Recommendations will vary depending on your family history and other risks. Hepatitis C blood test. Hepatitis B blood test. Sexually transmitted disease (STD) testing. Diabetes screening. This is done by checking your blood sugar (glucose) after you have not eaten for a while (fasting). You may have this done every 1-3 years. Abdominal aortic aneurysm (AAA) screening. You may need this if you are a current or former smoker. Osteoporosis. You may be screened starting at age 88 if you are at high risk. Talk with your health care provider about your test results, treatment options, and if necessary, the need for more tests. Vaccines  Your health care provider may recommend certain vaccines, such as: Influenza vaccine. This is recommended every year. Tetanus, diphtheria, and acellular pertussis (Tdap, Td) vaccine. You may need a Td booster every 10 years. Zoster vaccine. You may need this after age 31. Pneumococcal 13-valent conjugate (PCV13) vaccine. One dose is recommended after age 21. Pneumococcal polysaccharide (PPSV23) vaccine. One dose is recommended after age 60. Talk to your health care provider about which screenings  and vaccines you need and how often you need  them. This information is not intended to replace advice given to you by your health care provider. Make sure you discuss any questions you have with your health care provider. Document Released: 06/21/2015 Document Revised: 02/12/2016 Document Reviewed: 03/26/2015 Elsevier Interactive Patient Education  2017 [employer] Prevention in the Home Falls can cause injuries. They can happen to people of all ages. There are many things you can do to make your home safe and to help prevent falls. What can I do on the outside of my home? Regularly fix the edges of walkways and driveways and fix any cracks. Remove anything that might make you trip as you walk through a door, such as a raised step or threshold. Trim any bushes or trees on the path to your home. Use bright outdoor lighting. Clear any walking paths of anything that might make someone trip, such as rocks or tools. Regularly check to see if handrails are loose or broken. Make sure that both sides of any steps have handrails. Any raised decks and porches should have guardrails on the edges. Have any leaves, snow, or ice cleared regularly. Use sand or salt on walking paths during winter. Clean up any spills in your garage right away. This includes oil or grease spills. What can I do in the bathroom? Use night lights. Install grab bars by the toilet and in the tub and shower. Do not use towel bars as grab bars. Use non-skid mats or decals in the tub or shower. If you need to sit down in the shower, use a plastic, non-slip stool. Keep the floor dry. Clean up any water that spills on the floor as soon as it happens. Remove soap buildup in the tub or shower regularly. Attach bath mats securely with double-sided non-slip rug tape. Do not have throw rugs and other things on the floor that can make you trip. What can I do in the bedroom? Use night lights. Make sure that you have a light by your bed that is easy to reach. Do not use  any sheets or blankets that are too big for your bed. They should not hang down onto the floor. Have a firm chair that has side arms. You can use this for support while you get dressed. Do not have throw rugs and other things on the floor that can make you trip. What can I do in the kitchen? Clean up any spills right away. Avoid walking on wet floors. Keep items that you use a lot in easy-to-reach places. If you need to reach something above you, use a strong step stool that has a grab bar. Keep electrical cords out of the way. Do not use floor polish or wax that makes floors slippery. If you must use wax, use non-skid floor wax. Do not have throw rugs and other things on the floor that can make you trip. What can I do with my stairs? Do not leave any items on the stairs. Make sure that there are handrails on both sides of the stairs and use them. Fix handrails that are broken or loose. Make sure that handrails are as long as the stairways. Check any carpeting to make sure that it is firmly attached to the stairs. Fix any carpet that is loose or worn. Avoid having throw rugs at the top or bottom of the stairs. If you do have throw rugs, attach them to the floor with carpet tape.  Make sure that you have a light switch at the top of the stairs and the bottom of the stairs. If you do not have them, ask someone to add them for you. What else can I do to help prevent falls? Wear shoes that: Do not have high heels. Have rubber bottoms. Are comfortable and fit you well. Are closed at the toe. Do not wear sandals. If you use a stepladder: Make sure that it is fully opened. Do not climb a closed stepladder. Make sure that both sides of the stepladder are locked into place. Ask someone to hold it for you, if possible. Clearly mark and make sure that you can see: Any grab bars or handrails. First and last steps. Where the edge of each step is. Use tools that help you move around (mobility aids)  if they are needed. These include: Canes. Walkers. Scooters. Crutches. Turn on the lights when you go into a dark area. Replace any light bulbs as soon as they burn out. Set up your furniture so you have a clear path. Avoid moving your furniture around. If any of your floors are uneven, fix them. If there are any pets around you, be aware of where they are. Review your medicines with your doctor. Some medicines can make you feel dizzy. This can increase your chance of falling. Ask your doctor what other things that you can do to help prevent falls. This information is not intended to replace advice given to you by your health care provider. Make sure you discuss any questions you have with your health care provider. Document Released: 03/21/2009 Document Revised: 10/31/2015 Document Reviewed: 06/29/2014 Elsevier Interactive Patient Education  2017 [employer].

## 2022-05-19 NOTE — Progress Notes (Signed)
Subjective:   Adam [name] is a 71 y.o. male who presents for Medicare Annual/Subsequent preventive examination.  I connected with  Adam [name] on 05/19/22 by a audio enabled telemedicine application and verified that I am speaking with the correct person using two identifiers.  Patient Location: Home  Provider Location: Office/Clinic  I discussed the limitations of evaluation and management by telemedicine. The patient expressed understanding and agreed to proceed.  Review of Systems     Adam [name] , Thank you for taking time to come for your Medicare Wellness Visit. I appreciate your ongoing commitment to your health goals. Please review the following plan we discussed and let me know if I can assist you in the future.   These are the goals we discussed:  Goals      Patient Stated     Keep living a healthy life.        This is a list of the screening recommended for you and due dates:  Health Maintenance  Topic Date Due   Hepatitis C Screening: USPSTF Recommendation to screen - Ages 17-79 yo.  Never done   Medicare Annual Wellness Visit  05/20/2023   Colon Cancer Screening  10/25/2023   DTaP/Tdap/Td vaccine (3 - Td or Tdap) 09/21/2029   Pneumonia Vaccine  Completed   Flu Shot  Completed   Zoster (Shingles) Vaccine  Completed   HPV Vaccine  Aged Out   COVID-19 Vaccine  Discontinued          Objective:    There were no vitals filed for this visit. There is no height or weight on file to calculate BMI.     05/07/2021    1:32 PM 04/15/2019    9:24 PM 05/11/2016    9:55 AM 06/10/2015    7:08 PM 11/22/2014   10:22 AM 06/17/2014    5:51 AM 10/24/2013    7:22 AM  Advanced Directives  Does Patient Have a Medical Advance Directive? No No No No Yes No Patient does not have advance directive;Patient would not like information  Type of Advance Directive     [place]    Would patient like information on creating a medical advance  directive? Yes (ED - Information included in AVS)  No - Patient declined   No - patient declined information   Pre-existing out of facility DNR order (yellow form or pink MOST form)       No    Current Medications (verified) Outpatient Encounter Medications as of 05/19/2022  Medication Sig   azithromycin (ZITHROMAX) 250 MG tablet Take 2 tablets on day 1, then 1 tablet daily on days 2 through 5   doxazosin (CARDURA) 8 MG tablet TAKE 1 TABLET AT BEDTIME   enalapril (VASOTEC) 20 MG tablet TAKE 1 TABLET EVERY DAY   hydrALAZINE (APRESOLINE) 25 MG tablet Take 1 tablet (25 mg total) by mouth 2 (two) times daily.   loratadine (CLARITIN) 10 MG tablet Take 10 mg by mouth daily as needed for allergies.    meloxicam (MOBIC) 15 MG tablet Take 15 mg by mouth 3 (three) times daily.   pantoprazole (PROTONIX) 40 MG tablet TAKE 1 TABLET TWICE DAILY   pravastatin (PRAVACHOL) 20 MG tablet TAKE 1 TABLET AT BEDTIME (NEED MD APPOINTMENT)   verapamil (CALAN-SR) 180 MG CR tablet TAKE 1 TABLET TWICE DAILY   No facility-administered encounter medications on file as of 05/19/2022.    Allergies (verified) Sulfonamide derivatives   History: Past Medical  History:  Diagnosis Date   Allergy    BMI 31.0-31.9,adult JUNE 2011 180 LBS    Chronic back pain    Depression    Eosinophilic granuloma ([place]) JAN 2011 GASTRIC, followed by Dr. Eugenia Pancoast   EGD JAN 2011, JUN 2011, NOV 2011   Gastric ulcer 08/31/2012   GERD (gastroesophageal reflux disease)    HTN (hypertension)    Hyperlipemia    IBS (irritable bowel syndrome)    Prostate hypertrophy    Past Surgical History:  Procedure Laterality Date   BACK SURGERY     COLONOSCOPY  JAN 2011 COMPLETE   hypoTN and bradycardia w/ Demerol ,Phenergan and Versed, [place] TICS, SML IH   COLONOSCOPY  2003   NUR-normal   COLONOSCOPY  06/21/09   Fields-sigmoid diverticulosis, sm int hemorrhoids   COLONOSCOPY N/A 10/24/2013   Normal mucosa in the terminal ileum Moderate  diverticulosis noted in the sigmoid colonModerate sized internal hemorrhoids. negative random colon bx.. next TCS 10/2023   Cyst Removed     from Left leg and knee   ESOPHAGOGASTRODUODENOSCOPY  05/20/2012   SLF: MILD ESOPHAGITIS.NO BARRETT'S/Multiple ulcers ranging between 3-5 mm/MILD Duodenal inflammation was found in the duodenal bulb   ESOPHAGOGASTRODUODENOSCOPY N/A 09/05/2012   SLF: MILD Non-erosive gastritis (inflammation) in the gastric antrum. Biopsies benign, no H. pylori.   ESOPHAGOGASTRODUODENOSCOPY N/A 10/24/2013   VQQ:VZDGLOVF DISTAL ESOPHAGEAL WEBSmall hiatal herniaMILD Non-erosive gastritis. SB bx benign. gastric bx, minimal inflammation.    GANGLION CYST EXCISION     from L wrist and pins placed for Fx involving L thumb   HAND SURGERY  at the age of 30   Brother accidentally cut it   MALONEY DILATION N/A 10/24/2013   Procedure: Venia Minks DILATION;  Surgeon: Danie Binder, MD;  Location[facility];  Service: Endoscopy;  Laterality: N/A;   MASS EXCISION  04/29/2012   Procedure: EXCISION MASS;  Surgeon: Donato Heinz, MD;  Location: [facility];  Service: General;  Laterality: Right;  Excision of Vascular Mass Right Arm   SAVORY DILATION N/A 10/24/2013   Procedure: SAVORY DILATION;  Surgeon: Danie Binder, MD;  Location: [facility];  Service: Endoscopy;  Laterality: N/A;   SHOULDER SURGERY Left 2017   TUMOR REMOVAL  1994   Benign from brain at [place]   GASTRIC ULCER-EOS GRANULOMA   UPPER GASTROINTESTINAL ENDOSCOPY  JUN 2011   GASTRIC ULCER- EOS, NO H. PYLORI   UPPER GASTROINTESTINAL ENDOSCOPY  NOV 2011   GASTRIC NODULE, no ulcer- EOS   Family History  Problem Relation Age of Onset   Hypertension Mother    Diabetes Mother    Hyperlipidemia Mother    Thyroid disease Mother    Hypertension Father    Diabetes Father    Stroke Father    Colon cancer Maternal Grandmother        OVER 31   Social History    Socioeconomic History   Marital status: Married    Spouse name: [name]   Number of children: 1   Years of education: 12   Highest education level: Not on file  Occupational History   Occupation: disabled  Tobacco Use   Smoking status: Former    Packs/day: 0.25    Years: 15.00    Total pack years: 3.75    Types: Cigars, Cigarettes    Quit date: 04/29/2002    Years since quitting: 20.0   Smokeless tobacco: Never  Substance and  Sexual Activity   Alcohol use: Not Currently   Drug use: No   Sexual activity: Yes    Birth control/protection: None  Other Topics Concern   Not on file  Social History Narrative   Lives w/ wife   Caffeine use: 1 cup coffee every morning   Social Determinants of Health   Financial Resource Strain: Low Risk  (05/07/2021)   Overall Financial Resource Strain (CARDIA)    Difficulty of Paying Living Expenses: Not hard at all  Food Insecurity: No Food Insecurity (05/07/2021)   Hunger Vital Sign    Worried About Running Out of Food in the Last Year: Never true    Ran Out of Food in the Last Year: Never true  Transportation Needs: No Transportation Needs (05/07/2021)   PRAPARE - Hydrologist (Medical): No    Lack of Transportation (Non-Medical): No  Physical Activity: Sufficiently Active (05/07/2021)   Exercise Vital Sign    Days of Exercise per Week: 5 days    Minutes of Exercise per Session: 30 min  Stress: No Stress Concern Present (05/07/2021)   [place]    Feeling of Stress : Not at all  Social Connections: Moderately Integrated (05/07/2021)   Social Connection and Isolation Panel [NHANES]    Frequency of Communication with Friends and Family: Once a week    Frequency of Social Gatherings with Friends and Family: More than three times a week    Attends Religious Services: More than 4 times per year    Active Member of [employer] or Organizations: No     Attends Archivist Meetings: Never    Marital Status: Married    Tobacco Counseling Counseling given: Not Answered   Clinical Intake:                 Diabetic? No         Activities of Daily Living     No data to display           Patient Care Team: Lindell Spar, MD as PCP - General (Internal Medicine) Danie Binder, MD (Inactive) (Gastroenterology)  Indicate any recent Medical Services you may have received from other than [facility] providers in the past year (date may be approximate).     Assessment:   This is a routine wellness examination for [place].  Hearing/Vision screen No results found.  Dietary issues and exercise activities discussed:     Goals Addressed   None   Depression Screen    05/15/2022   11:36 AM 03/09/2022    8:06 AM 12/24/2021    9:35 AM 11/26/2021    3:10 PM 11/05/2021   10:58 AM 08/28/2021    3:31 PM 05/15/2021    8:09 AM  PHQ 2/9 Scores  PHQ - 2 Score 0 0 0 0 0 0 0  PHQ- 9 Score  0  0  0     Fall Risk    05/15/2022   11:35 AM 03/09/2022    8:06 AM 12/24/2021    9:35 AM 11/26/2021    3:10 PM 11/05/2021   10:58 AM  Fall Risk   Falls in the past year? 0 0 0 0 0  Number falls in past yr: 0 0 0 0 0  Injury with Fall? 0 0 0 0 0  Risk for fall due to : No Fall Risks No Fall Risks No Fall Risks No Fall Risks No Fall  Risks  Follow up Falls evaluation completed Falls evaluation completed Falls evaluation completed Falls evaluation completed Falls evaluation completed    [place]:  Any stairs in or around the home? Yes  If so, are there any without handrails? No  Home free of loose throw rugs in walkways, pet beds, electrical cords, etc? Yes  Adequate lighting in your home to reduce risk of falls? Yes   ASSISTIVE DEVICES UTILIZED TO PREVENT FALLS:  Life alert? No  Use of a cane, walker or w/c? No  Grab bars in the bathroom? No  Shower chair or bench in shower? Yes   Elevated toilet seat or a handicapped toilet? No    Cognitive Function:    05/07/2021    1:34 PM  MMSE - Mini Mental State Exam  Not completed: Unable to complete        05/07/2021    1:34 PM  6CIT Screen  What Year? 0 points  What month? 0 points  What time? 0 points  Count back from 20 0 points  Months in reverse 0 points  Repeat phrase 0 points  Total Score 0 points    Immunizations Immunization History  Administered Date(s) Administered   Fluad Quad(high Dose 65+) 04/30/2020, 02/25/2021, 03/09/2022   Influenza Split 03/28/2013   Influenza,inj,Quad PF,6+ Mos 03/19/2014, 02/26/2015, 03/10/2016, 05/06/2017, 02/16/2018, 02/21/2019   Influenza-Unspecified 05/07/2012   Moderna Sars-Covid-2 Vaccination 07/16/2019, 08/16/2019   Pneumococcal Conjugate-13 02/16/2018   Pneumococcal Polysaccharide-23 03/19/2014   Td 06/07/2010   Tdap 09/22/2019   Zoster Recombinat (Shingrix) 03/20/2019, 08/28/2021    TDAP status: Up to date  Flu Vaccine status: Up to date  Pneumococcal vaccine status: Up to date  Covid-19 vaccine status: Completed vaccines  Qualifies for Shingles Vaccine? Yes   Zostavax completed Yes   Shingrix Completed?: Yes  Screening Tests Health Maintenance  Topic Date Due   Hepatitis C Screening  Never done   Medicare Annual Wellness (AWV)  05/20/2023   COLONOSCOPY (Pts 45-33yr Insurance coverage will need to be confirmed)  10/25/2023   DTaP/Tdap/Td (3 - Td or Tdap) 09/21/2029   Pneumonia Vaccine 71 Years old  Completed   INFLUENZA VACCINE  Completed   Zoster Vaccines- Shingrix  Completed   HPV VACCINES  Aged Out   COVID-19 Vaccine  Discontinued    Health Maintenance  Health Maintenance Due  Topic Date Due   Hepatitis C Screening  Never done    Colorectal cancer screening: No longer required.   Lung Cancer Screening: (Low Dose CT Chest recommended if Age 73-80years, 30 pack-year currently smoking OR have quit w/in 15years.) does not  qualify.   Lung Cancer Screening Referral: NA  Additional Screening:  Hepatitis C Screening: does qualify; Completed   Vision Screening: Recommended annual ophthalmology exams for early detection of glaucoma and other disorders of the eye. Is the patient up to date with their annual eye exam?  Yes  Who is the provider or what is the name of the office in which the patient attends annual eye exams? [facility]: Recommended annual dental exams for proper oral hygiene  Community Resource Referral / Chronic Care Management: CRR required this visit?  No   CCM required this visit?  No      Plan:     I have personally reviewed and noted the following in the patient's chart:   Medical and social history Use of alcohol, tobacco or illicit drugs  Current medications and  supplements including opioid prescriptions. Patient is not currently taking opioid prescriptions. Functional ability and status Nutritional status Physical activity Advanced directives List of other physicians Hospitalizations, surgeries, and ER visits in previous 12 months Vitals Screenings to include cognitive, depression, and falls Referrals and appointments  In addition, I have reviewed and discussed with patient certain preventive protocols, quality metrics, and best practice recommendations. A written personalized care plan for preventive services as well as general preventive health recommendations were provided to patient.     Johny Drilling, [place]   05/19/2022   Nurse Notes:  Mr. Warrell , Thank you for taking time to come for your Medicare Wellness Visit. I appreciate your ongoing commitment to your health goals. Please review the following plan we discussed and let me know if I can assist you in the future.   These are the goals we discussed:  Goals      Patient Stated     Keep living a healthy life.        This is a list of the screening recommended for you and due dates:   Health Maintenance  Topic Date Due   Hepatitis C Screening: USPSTF Recommendation to screen - Ages 6-79 yo.  Never done   Medicare Annual Wellness Visit  05/20/2023   Colon Cancer Screening  10/25/2023   DTaP/Tdap/Td vaccine (3 - Td or Tdap) 09/21/2029   Pneumonia Vaccine  Completed   Flu Shot  Completed   Zoster (Shingles) Vaccine  Completed   HPV Vaccine  Aged Out   COVID-19 Vaccine  Discontinued

## 2022-05-26 ENCOUNTER — Other Ambulatory Visit: Payer: Self-pay

## 2022-05-26 ENCOUNTER — Telehealth: Payer: Self-pay | Admitting: Internal Medicine

## 2022-05-26 DIAGNOSIS — I10 Essential (primary) hypertension: Secondary | ICD-10-CM

## 2022-05-26 MED ORDER — VERAPAMIL HCL ER 180 MG PO TBCR: 180.0000 mg | EXTENDED_RELEASE_TABLET | Freq: Two times a day (BID) | ORAL | 1 refills | Status: DC

## 2022-05-26 NOTE — Telephone Encounter (Signed)
Rx sent, patient advised 

## 2022-05-26 NOTE — Telephone Encounter (Signed)
Patient called said the pharmacy would not refill this medication because it has Dr Moshe Cipro name on it. Need new RX with proivder Dr Posey Pronto  Prescription Request  05/26/2022  Is this a "Controlled Substance" medicine? No  LOV: 03/09/2022  What is the name of the medication or equipment? verapamil (CALAN-SR) 180 MG CR tablet [[id number]]   Have you contacted your pharmacy to request a refill? Yes   Which pharmacy would you like this sent to?   [facility]   Patient notified that their request is being sent to the clinical staff for review and that they should receive a response within 2 business days.   Please advise at Mobile [phone] (mobile)

## 2022-06-04 ENCOUNTER — Telehealth: Payer: Self-pay | Admitting: Internal Medicine

## 2022-06-04 ENCOUNTER — Other Ambulatory Visit: Payer: Self-pay

## 2022-06-04 DIAGNOSIS — I10 Essential (primary) hypertension: Secondary | ICD-10-CM

## 2022-06-04 DIAGNOSIS — E785 Hyperlipidemia, unspecified: Secondary | ICD-10-CM

## 2022-06-04 MED ORDER — VERAPAMIL HCL ER 180 MG PO TBCR: 180.0000 mg | EXTENDED_RELEASE_TABLET | Freq: Two times a day (BID) | ORAL | 1 refills | Status: DC

## 2022-06-04 MED ORDER — DOXAZOSIN MESYLATE 8 MG PO TABS: ORAL_TABLET | ORAL | 1 refills | Status: DC

## 2022-06-04 MED ORDER — PRAVASTATIN SODIUM 20 MG PO TABS: ORAL_TABLET | ORAL | 1 refills | Status: DC

## 2022-06-04 NOTE — Telephone Encounter (Signed)
Patient is out of med   Needs refill on   verapamil (CALAN-SR) 180 MG CR tablet   pravastatin (PRAVACHOL) 20 MG tablet   doxazosin (CARDURA) 8 MG tablet    Requesting small amount be sent in to [place]. Until mail in order comes in from  [place], [address], [place] [address] Phone: [phone]  Fax: [phone] DEA #: -- DAW Reason: --

## 2022-06-04 NOTE — Telephone Encounter (Signed)
Refills sent

## 2022-07-21 ENCOUNTER — Other Ambulatory Visit: Payer: Self-pay | Admitting: Internal Medicine

## 2022-07-21 DIAGNOSIS — E785 Hyperlipidemia, unspecified: Secondary | ICD-10-CM

## 2022-07-21 DIAGNOSIS — I10 Essential (primary) hypertension: Secondary | ICD-10-CM

## 2022-07-22 ENCOUNTER — Other Ambulatory Visit: Payer: Self-pay | Admitting: Family Medicine

## 2022-07-22 DIAGNOSIS — K219 Gastro-esophageal reflux disease without esophagitis: Secondary | ICD-10-CM

## 2022-07-22 DIAGNOSIS — I10 Essential (primary) hypertension: Secondary | ICD-10-CM

## 2022-07-28 ENCOUNTER — Ambulatory Visit (INDEPENDENT_AMBULATORY_CARE_PROVIDER_SITE_OTHER): Payer: Medicare HMO | Admitting: Internal Medicine

## 2022-07-28 ENCOUNTER — Encounter: Payer: Self-pay | Admitting: Internal Medicine

## 2022-07-28 ENCOUNTER — Ambulatory Visit (HOSPITAL_COMMUNITY)
Admission: RE | Admit: 2022-07-28 | Discharge: 2022-07-28 | Disposition: A | Discharge status: Home / Self Care | Payer: Medicare HMO | Source: Ambulatory Visit | Attending: Internal Medicine | Admitting: Internal Medicine

## 2022-07-28 VITALS — BP 213/78 | HR 69 | Ht 64.0 in | Wt 168.1 lb

## 2022-07-28 DIAGNOSIS — I10 Essential (primary) hypertension: Secondary | ICD-10-CM | POA: Diagnosis not present

## 2022-07-28 DIAGNOSIS — R051 Acute cough: Secondary | ICD-10-CM | POA: Insufficient documentation

## 2022-07-28 DIAGNOSIS — Z1152 Encounter for screening for COVID-19: Secondary | ICD-10-CM | POA: Diagnosis not present

## 2022-07-28 DIAGNOSIS — R6889 Other general symptoms and signs: Secondary | ICD-10-CM | POA: Diagnosis not present

## 2022-07-28 DIAGNOSIS — R059 Cough, unspecified: Secondary | ICD-10-CM | POA: Insufficient documentation

## 2022-07-28 MED ORDER — HYDROCHLOROTHIAZIDE 12.5 MG PO TABS: 12.5000 mg | ORAL_TABLET | Freq: Every day | ORAL | 3 refills | Status: DC

## 2022-07-28 MED ORDER — DOXYCYCLINE HYCLATE 100 MG PO TABS: 100.0000 mg | ORAL_TABLET | Freq: Two times a day (BID) | ORAL | 0 refills | Status: DC

## 2022-07-28 NOTE — Assessment & Plan Note (Addendum)
BP Readings from Last 3 Encounters:  07/28/22 (!) 213/78  03/09/22 136/64  12/24/21 120/68   Patient had elevated blood pressure today.  Previously well-controlled patient reports compliance with verapamil 180 mg twice daily, enalapril 20 mg daily, and hydralazine 25 mg twice daily.  Denies any recent high salt meal. -Extremely elevated blood pressure, will start additional medication. It is not quite clear why patient's blood pressure has increased and I would suspect he has missed medications. Patient reassures me he is taking current medications prescribed. - Continue current regimen - Start HCTZ 12.5 mg -Follow up with blood pressure readings in 2 weeks

## 2022-07-28 NOTE — Patient Instructions (Signed)
Thank you, [name] for allowing [place] to provide your care today.   1. Essential hypertension, benign - hydrochlorothiazide (HYDRODIURIL) 12.5 MG tablet; Take 1 tablet (12.5 mg total) by mouth daily.  Dispense: 90 tablet; Refill: 3 - Check blood pressure at home and bring readings to next visit  2. Acute cough - DG Chest 2 View - doxycycline (VIBRA-TABS) 100 MG tablet; Take 1 tablet (100 mg total) by mouth 2 (two) times daily. 1 po bid  Dispense: 14 tablet; Refill: 0    Tamsen Snider, M.D.

## 2022-07-28 NOTE — Progress Notes (Signed)
   HPI:[name] is a 72 y.o. male who presents for evaluation of cough and hypertension. For the details of today's visit, please refer to the assessment and plan.  Physical Exam: Vitals:   07/28/22 0827 07/28/22 0829  BP: (!) 193/78 (!) 213/78  Pulse: 69   SpO2: 97%   Weight: 168 lb 1.9 oz (76.3 kg)   Height: 5' 4"$  (1.626 m)      Physical Exam Constitutional:      General: He is not in acute distress.    Appearance: He is not ill-appearing.     Comments: Coughing periodically during exam.   Cardiovascular:     Rate and Rhythm: Normal rate and regular rhythm.     Heart sounds: Murmur (Grade3 systolic murmur at right sternal border) heard.  Pulmonary:     Effort: Pulmonary effort is normal.     Breath sounds: No wheezing, rhonchi or rales.  Musculoskeletal:     Right lower leg: No edema.     Left lower leg: No edema.      Assessment & Plan:   [name] was seen today for cough.  Essential hypertension, benign Assessment & Plan: BP Readings from Last 3 Encounters:  07/28/22 (!) 213/78  03/09/22 136/64  12/24/21 120/68   Patient had elevated blood pressure today.  Previously well-controlled patient reports compliance with verapamil 180 mg twice daily, enalapril 20 mg daily, and hydralazine 25 mg twice daily.  Denies any recent high salt meal. -Extremely elevated blood pressure, will start additional medication. It is not quite clear why patient's blood pressure has increased and I would suspect he has missed medications. Patient reassures me he is taking current medications prescribed. - Continue current regimen - Start HCTZ 12.5 mg -Follow up with blood pressure readings in 2 weeks   Orders: -     hydroCHLOROthiazide; Take 1 tablet (12.5 mg total) by mouth daily.  Dispense: 90 tablet; Refill: 3  Acute cough Assessment & Plan: Cough: Patient has had a cough for 2 months and it has worsened in last 2 weeks. He now has some sore throat. Cough is  productive of sputum. It is keeping him awake. Felt like he had fever at home, but not objective fever. No recent sick contacts. Former smoker with 3.75 pack years. No Asthma. He has taken tylenol, robitussin, and BC but it has not improved. He also has noticed difficulty with swallowing some foods, but not a daily occurence. He has history of endoscopy with dilation 5 years ago. Currently on Protonix 40 mg daily. Enalapril is a chronic medication. History of aortic stenosis and elevated BP today. Denies SOB. - COVID-19, Flu A+B and RSV - Will send for chest xray given he has symptoms for 2 months. Will start antibiotic for presumed bronchitis. - DG Chest 2 View - doxycycline (VIBRA-TABS) 100 MG tablet; Take 1 tablet (100 mg total) by mouth 2 (two) times daily. 1 po bid  Dispense: 14 tablet; Refill: 0      Orders: -     DG Chest 2 View -     Doxycycline Hyclate; Take 1 tablet (100 mg total) by mouth 2 (two) times daily. 1 po bid  Dispense: 14 tablet; Refill: 0 -     COVID-19, Flu A+B and RSV      Lorene Dy, MD

## 2022-07-28 NOTE — Assessment & Plan Note (Addendum)
Cough: Patient has had a cough for 2 months and it has worsened in last 2 weeks. He now has some sore throat. Cough is productive of sputum. It is keeping him awake. Felt like he had fever at home, but not objective fever. No recent sick contacts. Former smoker with 3.75 pack years. No Asthma. He has taken tylenol, robitussin, and BC but it has not improved. He also has noticed difficulty with swallowing some foods, but not a daily occurence. He has history of endoscopy with dilation 5 years ago. Currently on Protonix 40 mg daily. Enalapril is a chronic medication. History of aortic stenosis and elevated BP today. Denies SOB. - COVID-19, Flu A+B and RSV - Will send for chest xray given he has symptoms for 2 months. Will start antibiotic for presumed bronchitis. - DG Chest 2 View - doxycycline (VIBRA-TABS) 100 MG tablet; Take 1 tablet (100 mg total) by mouth 2 (two) times daily. 1 po bid  Dispense: 14 tablet; Refill: 0

## 2022-07-30 LAB — COVID-19, FLU A+B AND RSV
Influenza A, NAA: NOT DETECTED
Influenza B, NAA: NOT DETECTED
RSV, NAA: NOT DETECTED
SARS-CoV-2, NAA: NOT DETECTED

## 2022-08-01 ENCOUNTER — Emergency Department (HOSPITAL_COMMUNITY): Payer: Medicare HMO

## 2022-08-01 ENCOUNTER — Encounter (HOSPITAL_COMMUNITY): Payer: Self-pay

## 2022-08-01 ENCOUNTER — Other Ambulatory Visit: Payer: Self-pay

## 2022-08-01 ENCOUNTER — Emergency Department (HOSPITAL_COMMUNITY)
Admission: EM | Admit: 2022-08-01 | Discharge: 2022-08-02 | Disposition: A | Discharge status: Home / Self Care | Payer: Medicare HMO | Attending: Student | Admitting: Student

## 2022-08-01 DIAGNOSIS — Z87891 Personal history of nicotine dependence: Secondary | ICD-10-CM | POA: Diagnosis not present

## 2022-08-01 DIAGNOSIS — M25511 Pain in right shoulder: Secondary | ICD-10-CM | POA: Diagnosis not present

## 2022-08-01 DIAGNOSIS — M79601 Pain in right arm: Secondary | ICD-10-CM | POA: Insufficient documentation

## 2022-08-01 DIAGNOSIS — I10 Essential (primary) hypertension: Secondary | ICD-10-CM | POA: Insufficient documentation

## 2022-08-01 DIAGNOSIS — Z79899 Other long term (current) drug therapy: Secondary | ICD-10-CM | POA: Diagnosis not present

## 2022-08-01 DIAGNOSIS — R0789 Other chest pain: Secondary | ICD-10-CM | POA: Diagnosis not present

## 2022-08-01 DIAGNOSIS — J90 Pleural effusion, not elsewhere classified: Secondary | ICD-10-CM | POA: Diagnosis not present

## 2022-08-01 DIAGNOSIS — E876 Hypokalemia: Secondary | ICD-10-CM | POA: Diagnosis not present

## 2022-08-01 DIAGNOSIS — R079 Chest pain, unspecified: Secondary | ICD-10-CM | POA: Diagnosis not present

## 2022-08-01 LAB — BASIC METABOLIC PANEL
Anion gap: 9 (ref 5–15)
BUN: 18 mg/dL (ref 8–23)
CO2: 26 mmol/L (ref 22–32)
Calcium: 9.3 mg/dL (ref 8.9–10.3)
Chloride: 100 mmol/L (ref 98–111)
Creatinine, Ser: 1.11 mg/dL (ref 0.61–1.24)
GFR, Estimated: >60 mL/min (ref >60)
Glucose, Bld: 105 mg/dL — ABNORMAL HIGH (ref 70–99)
Potassium: 3 mmol/L — ABNORMAL LOW (ref 3.5–5.1)
Sodium: 135 mmol/L (ref 135–145)

## 2022-08-01 LAB — CBC
HCT: 42.3 % (ref 39.0–52.0)
Hemoglobin: 14.5 g/dL (ref 13.0–17.0)
MCH: 28.3 pg (ref 26.0–34.0)
MCHC: 34.3 g/dL (ref 30.0–36.0)
MCV: 82.5 fL (ref 80.0–100.0)
Platelets: 302 10*3/uL (ref 150–400)
RBC: 5.13 MIL/uL (ref 4.22–5.81)
RDW: 13.9 % (ref 11.5–15.5)
WBC: 8.4 10*3/uL (ref 4.0–10.5)
nRBC: 0 % (ref 0.0–0.2)

## 2022-08-01 LAB — TROPONIN I (HIGH SENSITIVITY): Troponin I (High Sensitivity): 30 ng/L — ABNORMAL HIGH (ref <18)

## 2022-08-01 MED ORDER — ALUM & MAG HYDROXIDE-SIMETH 200-200-20 MG/5ML PO SUSP
30.0000 mL | Freq: Once | ORAL | Status: AC
  Administered 2022-08-02: 30 mL via ORAL
  Filled 2022-08-01: qty 30

## 2022-08-01 MED ORDER — LIDOCAINE VISCOUS HCL 2 % MT SOLN
15.0000 mL | Freq: Once | OROMUCOSAL | Status: AC
  Administered 2022-08-02: 15 mL via ORAL
  Filled 2022-08-01: qty 15

## 2022-08-01 MED ORDER — ACETAMINOPHEN 500 MG PO TABS
1000.0000 mg | ORAL_TABLET | Freq: Once | ORAL | Status: AC
  Administered 2022-08-02: 1000 mg via ORAL
  Filled 2022-08-01: qty 2

## 2022-08-01 MED ORDER — POTASSIUM CHLORIDE CRYS ER 20 MEQ PO TBCR
40.0000 meq | EXTENDED_RELEASE_TABLET | Freq: Once | ORAL | Status: AC
  Administered 2022-08-02: 40 meq via ORAL
  Filled 2022-08-01: qty 2

## 2022-08-01 MED ORDER — MAGNESIUM OXIDE -MG SUPPLEMENT 400 (240 MG) MG PO TABS
800.0000 mg | ORAL_TABLET | Freq: Once | ORAL | Status: AC
  Administered 2022-08-02: 800 mg via ORAL
  Filled 2022-08-01: qty 2

## 2022-08-01 NOTE — ED Triage Notes (Signed)
Pt reports central chest pressure and right arm pain that started this morning. Pain rated 3 of 10 right now, pt says pain has lessened since earlier- pt took BP meds this evening.

## 2022-08-01 NOTE — ED Provider Notes (Signed)
[facility] Provider Note  CSN: [id number] Arrival date & time: 08/01/22 2209  Chief Complaint(s) Chest Pain (Right arm pain)  HPI WILKIE PACKWOOD is a 72 y.o. male with PMH aortic stenosis, gastric ulcer, GERD, HTN, HLD, IBS, possible esophageal web who presents emergency room for evaluation of chest pain and right arm pain.  Patient states that he had central chest pain and pressure with associated right arm pain that started this morning.  He states he noticed systolic blood pressures blood pressures in the 170s and took his blood pressure medicines.  Here in emergency room, blood pressures have improved and he states that his pain is still present but fairly mild approximately 3 out of 10.  Denies recent heavy lifting, nausea, vomiting, shortness of breath or any exertional component to the chest pain.  Denies abdominal pain, headache, fever, numbness, tingling, weakness or other systemic or neurologic complaints.   Past Medical History Past Medical History:  Diagnosis Date   Allergy    BMI 31.0-31.9,adult JUNE 2011 180 LBS    Chronic back pain    Depression    Eosinophilic granuloma ([place]) JAN 2011 GASTRIC, followed by Dr. Eugenia Pancoast   EGD JAN 2011, JUN 2011, NOV 2011   Gastric ulcer 08/31/2012   GERD (gastroesophageal reflux disease)    HTN (hypertension)    Hyperlipemia    IBS (irritable bowel syndrome)    Prostate hypertrophy    Patient Active Problem List   Diagnosis Date Noted   Cough 07/28/2022   Encounter for general adult medical examination with abnormal findings 03/09/2022   Vitamin D deficiency 03/09/2022   Tick bite of right hip 12/24/2021   OA (osteoarthritis) of knee 11/05/2021   Nonrheumatic aortic valve stenosis 08/28/2021   BPH (benign prostatic hyperplasia) 0000000   Systolic murmur 0000000   Carpal tunnel syndrome 01/12/2020   Depression 03/10/2016   Cervical disc disorder with radiculopathy of  cervical region 10/31/2014   IBS (irritable bowel syndrome) 10/19/2013   Back pain 01/02/2013   Hyperlipidemia 09/20/2012   Essential hypertension, benign 09/20/2012   GERD (gastroesophageal reflux disease) [id number]   Eosinophilic gastritis [id number]   GRANULOMA 08/27/2009   Dysphagia 06/17/2009   Home Medication(s) Prior to Admission medications   Medication Sig Start Date End Date Taking? Authorizing Provider  benzonatate (TESSALON) 100 MG capsule Take 1 capsule (100 mg total) by mouth 2 (two) times daily as needed for cough. 05/19/22   Lindell Spar, MD  doxazosin (CARDURA) 8 MG tablet TAKE 1 TABLET AT BEDTIME 07/22/22   Lindell Spar, MD  doxycycline (VIBRA-TABS) 100 MG tablet Take 1 tablet (100 mg total) by mouth 2 (two) times daily. 1 po bid 07/28/22   Lyndal Pulley, MD  enalapril (VASOTEC) 20 MG tablet TAKE 1 TABLET EVERY DAY 07/22/22   Lindell Spar, MD  hydrALAZINE (APRESOLINE) 25 MG tablet TAKE 1 TABLET TWICE DAILY 05/19/22   Lindell Spar, MD  hydrochlorothiazide (HYDRODIURIL) 12.5 MG tablet Take 1 tablet (12.5 mg total) by mouth daily. 07/28/22   Lyndal Pulley, MD  loratadine (CLARITIN) 10 MG tablet Take 10 mg by mouth daily as needed for allergies.     [provider]  meloxicam (MOBIC) 15 MG tablet Take 15 mg by mouth 3 (three) times daily. 02/11/22   [provider]  pantoprazole (PROTONIX) 40 MG tablet TAKE 1 TABLET TWICE DAILY 07/22/22   Lindell Spar, MD  PARoxetine (PAXIL) 20 MG tablet  TAKE 1 TABLET EVERY DAY 07/22/22   Lindell Spar, MD  pravastatin (PRAVACHOL) 20 MG tablet TAKE 1 TABLET AT BEDTIME (NEED MD APPOINTMENT) 07/22/22   Lindell Spar, MD  verapamil (CALAN-SR) 180 MG CR tablet TAKE 1 TABLET TWICE DAILY 07/22/22   Lindell Spar, MD                                                                                                                                    Past Surgical History Past Surgical History:  Procedure Laterality Date    BACK SURGERY     COLONOSCOPY  JAN 2011 COMPLETE   hypoTN and bradycardia w/ Demerol ,Phenergan and Versed, [place] TICS, SML IH   COLONOSCOPY  2003   NUR-normal   COLONOSCOPY  06/21/09   Fields-sigmoid diverticulosis, sm int hemorrhoids   COLONOSCOPY N/A 10/24/2013   Normal mucosa in the terminal ileum Moderate diverticulosis noted in the sigmoid colonModerate sized internal hemorrhoids. negative random colon bx.. next TCS 10/2023   Cyst Removed     from Left leg and knee   ESOPHAGOGASTRODUODENOSCOPY  05/20/2012   SLF: MILD ESOPHAGITIS.NO BARRETT'S/Multiple ulcers ranging between 3-5 mm/MILD Duodenal inflammation was found in the duodenal bulb   ESOPHAGOGASTRODUODENOSCOPY N/A 09/05/2012   SLF: MILD Non-erosive gastritis (inflammation) in the gastric antrum. Biopsies benign, no H. pylori.   ESOPHAGOGASTRODUODENOSCOPY N/A 10/24/2013   [id number] DISTAL ESOPHAGEAL WEBSmall hiatal herniaMILD Non-erosive gastritis. SB bx benign. gastric bx, minimal inflammation.    GANGLION CYST EXCISION     from L wrist and pins placed for Fx involving L thumb   HAND SURGERY  at the age of 20   Brother accidentally cut it   MALONEY DILATION N/A 10/24/2013   Procedure: Venia Minks DILATION;  Surgeon: Danie Binder, MD;  Location: [facility];  Service: Endoscopy;  Laterality: N/A;   MASS EXCISION  04/29/2012   Procedure: EXCISION MASS;  Surgeon: Donato Heinz, MD;  Location: [facility];  Service: General;  Laterality: Right;  Excision of Vascular Mass Right Arm   SAVORY DILATION N/A 10/24/2013   Procedure: SAVORY DILATION;  Surgeon: Danie Binder, MD;  Location: [facility];  Service: Endoscopy;  Laterality: N/A;   SHOULDER SURGERY Left 2017   TUMOR REMOVAL  1994   Benign from brain at [facility]   GASTRIC ULCER-EOS GRANULOMA   UPPER GASTROINTESTINAL ENDOSCOPY  JUN 2011   GASTRIC ULCER- EOS, NO H. PYLORI   UPPER GASTROINTESTINAL ENDOSCOPY  NOV 2011   GASTRIC  NODULE, no ulcer- EOS   Family History Family History  Problem Relation Age of Onset   Hypertension Mother    Diabetes Mother    Hyperlipidemia Mother    Thyroid disease Mother    Hypertension Father    Diabetes Father    Stroke Father    Colon cancer Maternal Grandmother  OVER 60    Social History Social History   Tobacco Use   Smoking status: Former    Packs/day: 0.25    Years: 15.00    Total pack years: 3.75    Types: Cigars, Cigarettes    Quit date: 04/29/2002    Years since quitting: 20.2   Smokeless tobacco: Never  Substance Use Topics   Alcohol use: Not Currently   Drug use: No   Allergies Sulfonamide derivatives  Review of Systems Review of Systems  Respiratory:  Positive for chest tightness.   Cardiovascular:  Positive for chest pain.  Musculoskeletal:  Positive for arthralgias.    Physical Exam Vital Signs  I have reviewed the triage vital signs BP (!) 149/73   Pulse (!) 58   Temp 97.9 F (36.6 C) (Oral)   Resp 19   Ht '5\' 4"'$  (1.626 m)   Wt 76.3 kg   SpO2 97%   BMI 28.86 kg/m   Physical Exam Vitals and nursing note reviewed.  Constitutional:      General: He is not in acute distress.    Appearance: He is well-developed.  HENT:     Head: Normocephalic and atraumatic.  Eyes:     Conjunctiva/sclera: Conjunctivae normal.  Cardiovascular:     Rate and Rhythm: Normal rate and regular rhythm.     Heart sounds: Murmur heard.  Pulmonary:     Effort: Pulmonary effort is normal. No respiratory distress.     Breath sounds: Normal breath sounds.  Abdominal:     Palpations: Abdomen is soft.     Tenderness: There is no abdominal tenderness.  Musculoskeletal:        General: No swelling.     Cervical back: Neck supple.  Skin:    General: Skin is warm and dry.     Capillary Refill: Capillary refill takes less than 2 seconds.  Neurological:     Mental Status: He is alert.  Psychiatric:        Mood and Affect: Mood normal.     ED  Results and Treatments Labs (all labs ordered are listed, but only abnormal results are displayed) Labs Reviewed  BASIC METABOLIC PANEL - Abnormal; Notable for the following components:      Result Value   Potassium 3.0 (*)    Glucose, Bld 105 (*)    All other components within normal limits  TROPONIN I (HIGH SENSITIVITY) - Abnormal; Notable for the following components:   Troponin I (High Sensitivity) 30 (*)    All other components within normal limits  CBC                                                                                                                          Radiology DG Chest Port 1 View  Result Date: 08/01/2022 CLINICAL DATA:  Chest pain. EXAM: PORTABLE CHEST 1 VIEW COMPARISON:  07/28/2022, 4 days ago FINDINGS: Mild cardiomegaly. Unchanged mediastinal contours. No focal airspace disease. No pleural effusion or  pneumothorax. No acute osseous findings. IMPRESSION: Mild cardiomegaly. No acute chest findings. Electronically Signed   By: Keith Rake M.D.   On: 08/01/2022 23:07    Pertinent labs & imaging results that were available during my care of the patient were reviewed by me and considered in my medical decision making (see MDM for details).  Medications Ordered in ED Medications  potassium chloride SA (KLOR-CON M) CR tablet 40 mEq (has no administration in time range)  magnesium oxide (MAG-OX) tablet 800 mg (has no administration in time range)  acetaminophen (TYLENOL) tablet 1,000 mg (has no administration in time range)                                                                                                                                     Procedures Procedures  (including critical care time)  Medical Decision Making / ED Course   This patient presents to the ED for concern of chest pain, arm pain, this involves an extensive number of treatment options, and is a complaint that carries with it a high risk of complications and morbidity.  The  differential diagnosis includes ACS, Aortic Dissection, Pneumothorax, Pneumonia, Esophageal Rupture, PE, Tamponade/Pericardial Effusion, pericarditis, esophageal spasm, dysrhythmia, GERD, costochondritis.,  Musculoskeletal arm pain, contusion, hematoma, rotator cuff injury  MDM: Patient seen emerged part for evaluation of chest pain and arm pain.  Physical exam with tenderness over the lateral aspect of the right shoulder, holosystolic murmur but is otherwise unremarkable.  Laboratory evaluation with hypokalemia to 3.0 which was repleted in the emergency department, initial high-sensitivity troponin 30 but delta troponin 28.  ECG nonischemic.  Chest pain completely resolved with GI cocktail but on reevaluation right arm pain appears to worsen.  Trialed multiple different pain medications but arm pain persisted and thus more detailed imaging including a shoulder x-ray and CT PE were obtained to rule out referred pain versus fracture in the arm which were all reassuringly negative for acute pathology.  Patient pain ultimately controlled with small dose of fentanyl and he will follow-up outpatient with orthopedics for his shoulder pain.  Patient's heart score is 4 and thus we shared decision making to discuss inpatient stay versus outpatient cardiology follow-up and ultimately decided that given complete resolution with GI cocktail, history of esophagitis and peptic ulcer disease, ACS less likely and he would be safe to follow-up outpatient with his cardiologist.  Patient then discharged with outpatient follow-up and return precautions which he voiced understanding   Additional history obtained: -Additional history obtained from multiple family members -External records from outside source obtained and reviewed including: Chart review including previous notes, labs, imaging, consultation notes   Lab Tests: -I ordered, reviewed, and interpreted labs.   The pertinent results include:   Labs Reviewed  BASIC  METABOLIC PANEL - Abnormal; Notable for the following components:      Result Value   Potassium 3.0 (*)  Glucose, Bld 105 (*)    All other components within normal limits  TROPONIN I (HIGH SENSITIVITY) - Abnormal; Notable for the following components:   Troponin I (High Sensitivity) 30 (*)    All other components within normal limits  CBC      EKG   EKG Interpretation  Date/Time:  Saturday August 01 2022 22:24:16 EST Ventricular Rate:  61 PR Interval:  247 QRS Duration: 95 QT Interval:  379 QTC Calculation: 382 R Axis:   17 Text Interpretation: Sinus rhythm Prolonged PR interval No significant change since last tracing Confirmed by [place] ([id number]) on 08/01/2022 11:07:23 PM         Imaging Studies ordered: I ordered imaging studies including chest x-ray, shoulder x-ray, CT PE I independently visualized and interpreted imaging. I agree with the radiologist interpretation   Medicines ordered and prescription drug management: Meds ordered this encounter  Medications   potassium chloride SA (KLOR-CON M) CR tablet 40 mEq   magnesium oxide (MAG-OX) tablet 800 mg   acetaminophen (TYLENOL) tablet 1,000 mg    -I have reviewed the patients home medicines and have made adjustments as needed  Critical interventions none   Cardiac Monitoring: The patient was maintained on a cardiac monitor.  I personally viewed and interpreted the cardiac monitored which showed an underlying rhythm of: NSR  Social Determinants of Health:  Factors impacting patients care include: none   Reevaluation: After the interventions noted above, I reevaluated the patient and found that they have :improved  Co morbidities that complicate the patient evaluation  Past Medical History:  Diagnosis Date   Allergy    BMI 31.0-31.9,adult JUNE 2011 180 LBS    Chronic back pain    Depression    Eosinophilic granuloma ([place]) JAN 2011 GASTRIC, followed by Dr. Eugenia Pancoast   EGD JAN 2011, JUN 2011,  NOV 2011   Gastric ulcer 08/31/2012   GERD (gastroesophageal reflux disease)    HTN (hypertension)    Hyperlipemia    IBS (irritable bowel syndrome)    Prostate hypertrophy       Dispostion: I considered admission for this patient, and with heart score of 4, we shared decision making to decide on outpatient cardiology follow-up with strict return precautions.  Patient will also follow-up outpatient with orthopedics for shoulder pain     Final Clinical Impression(s) / ED Diagnoses Final diagnoses:  None     '@PCDICTATION'$ @    Teressa Lower, MD 08/02/22 [phone]

## 2022-08-01 NOTE — ED Notes (Signed)
Lab at bedside drawing 2nd trop

## 2022-08-02 ENCOUNTER — Emergency Department (HOSPITAL_COMMUNITY): Payer: Medicare HMO

## 2022-08-02 DIAGNOSIS — M25511 Pain in right shoulder: Secondary | ICD-10-CM | POA: Diagnosis not present

## 2022-08-02 DIAGNOSIS — J90 Pleural effusion, not elsewhere classified: Secondary | ICD-10-CM | POA: Diagnosis not present

## 2022-08-02 DIAGNOSIS — R0789 Other chest pain: Secondary | ICD-10-CM | POA: Diagnosis not present

## 2022-08-02 LAB — TROPONIN I (HIGH SENSITIVITY): Troponin I (High Sensitivity): 28 ng/L — ABNORMAL HIGH (ref <18)

## 2022-08-02 MED ORDER — IOHEXOL 350 MG/ML SOLN
75.0000 mL | Freq: Once | INTRAVENOUS | Status: AC | PRN
  Administered 2022-08-02: 75 mL via INTRAVENOUS

## 2022-08-02 MED ORDER — KETOROLAC TROMETHAMINE 15 MG/ML IJ SOLN
15.0000 mg | Freq: Once | INTRAMUSCULAR | Status: AC
  Administered 2022-08-02: 15 mg via INTRAVENOUS

## 2022-08-02 MED ORDER — HYDROCODONE-ACETAMINOPHEN 5-325 MG PO TABS: 1.0000 | ORAL_TABLET | Freq: Four times a day (QID) | ORAL | 0 refills | Status: DC | PRN

## 2022-08-02 MED ORDER — FENTANYL CITRATE PF 50 MCG/ML IJ SOSY
50.0000 ug | PREFILLED_SYRINGE | Freq: Once | INTRAMUSCULAR | Status: AC
  Administered 2022-08-02: 50 ug via INTRAVENOUS
  Filled 2022-08-02: qty 1

## 2022-08-02 MED ORDER — NITROGLYCERIN 0.4 MG SL SUBL
0.4000 mg | SUBLINGUAL_TABLET | Freq: Once | SUBLINGUAL | Status: AC
  Administered 2022-08-02: 0.4 mg via SUBLINGUAL
  Filled 2022-08-02: qty 1

## 2022-08-02 MED ORDER — KETOROLAC TROMETHAMINE 15 MG/ML IJ SOLN
15.0000 mg | Freq: Once | INTRAMUSCULAR | Status: DC
  Filled 2022-08-02: qty 1

## 2022-08-02 MED ORDER — LIDOCAINE 5 % EX PTCH: 1.0000 | MEDICATED_PATCH | CUTANEOUS | 0 refills | Status: DC

## 2022-08-02 NOTE — ED Notes (Signed)
Patient transported to CT 

## 2022-08-07 ENCOUNTER — Ambulatory Visit (INDEPENDENT_AMBULATORY_CARE_PROVIDER_SITE_OTHER): Payer: Medicare HMO | Admitting: Family Medicine

## 2022-08-07 ENCOUNTER — Telehealth: Payer: Self-pay | Admitting: *Deleted

## 2022-08-07 ENCOUNTER — Encounter: Payer: Self-pay | Admitting: Family Medicine

## 2022-08-07 VITALS — BP 152/98 | HR 63 | Ht 64.0 in | Wt 167.0 lb

## 2022-08-07 DIAGNOSIS — M25512 Pain in left shoulder: Secondary | ICD-10-CM

## 2022-08-07 DIAGNOSIS — I10 Essential (primary) hypertension: Secondary | ICD-10-CM | POA: Diagnosis not present

## 2022-08-07 DIAGNOSIS — M25511 Pain in right shoulder: Secondary | ICD-10-CM | POA: Diagnosis not present

## 2022-08-07 DIAGNOSIS — R1909 Other intra-abdominal and pelvic swelling, mass and lump: Secondary | ICD-10-CM

## 2022-08-07 NOTE — Telephone Encounter (Signed)
        Patient  visited [facility] on 08/01/2022  for treatment    Telephone encounter attempt :  1st  A HIPAA compliant voice message was left requesting a return call.  Instructed patient to call back at [phone].  [facility] [phone] [address] , [place] [address] Email : [email or web address]

## 2022-08-07 NOTE — Patient Instructions (Addendum)
I appreciate the opportunity to provide care to you today!    Follow up:  as needed  Labs: please stop by the lab today to get your blood drawn (CBC, CMP, TSH, Lipid profile, HgA1c, Vit D)  Screening: HIV and Hep C  Shoulder pain Please follow-up with orthopedics Continue to take your pain medication as prescribed  Headaches Likely due to your elevated BP Please continue taking your blood pressure medication as prescribed and follow-up with Dr. Court Joy on Tuesday for follow-up Recommend taking Tylenol as needed for headache  Palpable inguinal mass I will order an ultrasound of your pelvis to r/o hernias or lipoma  Referrals today-    Please continue to a heart-healthy diet and increase your physical activities. Try to exercise for 46mns at least five times a week.      It was a pleasure to see you and I look forward to continuing to work together on your health and well-being. Please do not hesitate to call the office if you need care or have questions about your care.   Have a wonderful day and week. With Gratitude, GAlvira MondayMSN, FNP-BC

## 2022-08-07 NOTE — Progress Notes (Unsigned)
Established Patient Office Visit  Subjective:  Patient ID: Adam [name], male    DOB: January 16, 1951  Age: 72 y.o. MRN: [id number]  CC:  Chief Complaint  Patient presents with   Follow-up    Ed f/u was seen on 08/01/2022, d/c on 08/02/22. Pt reports not feeling well, complains of shoulder pain on both sides, and continuous headaches. Reports some swelling in his groin area noticed it 07/31/2022.    HPI Adam [name] is a 72 y.o. male with past medical history of essential hypertension. Dysphagia, GERD, IBS, Carpal tunnel syndrome and osteoarthritis of the knee presents for f/u of  chronic medical conditions. For the details of today's visit, please refer to the assessment and plan.     Past Medical History:  Diagnosis Date   Allergy    BMI 31.0-31.9,adult JUNE 2011 180 LBS    Chronic back pain    Depression    Eosinophilic granuloma ([place]) JAN 2011 GASTRIC, followed by Dr. Eugenia Pancoast   EGD JAN 2011, JUN 2011, NOV 2011   Gastric ulcer 08/31/2012   GERD (gastroesophageal reflux disease)    HTN (hypertension)    Hyperlipemia    IBS (irritable bowel syndrome)    Prostate hypertrophy     Past Surgical History:  Procedure Laterality Date   BACK SURGERY     COLONOSCOPY  JAN 2011 COMPLETE   hypoTN and bradycardia w/ Demerol ,Phenergan and Versed, [place] TICS, SML IH   COLONOSCOPY  2003   NUR-normal   COLONOSCOPY  06/21/09   Fields-sigmoid diverticulosis, sm int hemorrhoids   COLONOSCOPY N/A 10/24/2013   Normal mucosa in the terminal ileum Moderate diverticulosis noted in the sigmoid colonModerate sized internal hemorrhoids. negative random colon bx.. next TCS 10/2023   Cyst Removed     from Left leg and knee   ESOPHAGOGASTRODUODENOSCOPY  05/20/2012   SLF: MILD ESOPHAGITIS.NO BARRETT'S/Multiple ulcers ranging between 3-5 mm/MILD Duodenal inflammation was found in the duodenal bulb   ESOPHAGOGASTRODUODENOSCOPY N/A 09/05/2012   SLF: MILD Non-erosive gastritis (inflammation) in  the gastric antrum. Biopsies benign, no H. pylori.   ESOPHAGOGASTRODUODENOSCOPY N/A 10/24/2013   [id number] DISTAL ESOPHAGEAL WEBSmall hiatal herniaMILD Non-erosive gastritis. SB bx benign. gastric bx, minimal inflammation.    GANGLION CYST EXCISION     from L wrist and pins placed for Fx involving L thumb   HAND SURGERY  at the age of 89   Brother accidentally cut it   MALONEY DILATION N/A 10/24/2013   Procedure: Venia Minks DILATION;  Surgeon: Danie Binder, MD;  Location: [facility];  Service: Endoscopy;  Laterality: N/A;   MASS EXCISION  04/29/2012   Procedure: EXCISION MASS;  Surgeon: Donato Heinz, MD;  Location: [facility];  Service: General;  Laterality: Right;  Excision of Vascular Mass Right Arm   SAVORY DILATION N/A 10/24/2013   Procedure: SAVORY DILATION;  Surgeon: Danie Binder, MD;  Location: [facility];  Service: Endoscopy;  Laterality: N/A;   SHOULDER SURGERY Left 2017   TUMOR REMOVAL  1994   Benign from brain at [place]   GASTRIC ULCER-EOS GRANULOMA   UPPER GASTROINTESTINAL ENDOSCOPY  JUN 2011   GASTRIC ULCER- EOS, NO H. PYLORI   UPPER GASTROINTESTINAL ENDOSCOPY  NOV 2011   GASTRIC NODULE, no ulcer- EOS    Family History  Problem Relation Age of Onset   Hypertension Mother    Diabetes Mother    Hyperlipidemia Mother  Thyroid disease Mother    Hypertension Father    Diabetes Father    Stroke Father    Colon cancer Maternal Grandmother        OVER 29    Social History   Socioeconomic History   Marital status: Married    Spouse name: [name]   Number of children: 1   Years of education: 12   Highest education level: Not on file  Occupational History   Occupation: disabled  Tobacco Use   Smoking status: Former    Packs/day: 0.25    Years: 15.00    Total pack years: 3.75    Types: Cigars, Cigarettes    Quit date: 04/29/2002    Years since quitting: 20.2   Smokeless tobacco: Never  Substance and  Sexual Activity   Alcohol use: Not Currently   Drug use: No   Sexual activity: Yes    Birth control/protection: None  Other Topics Concern   Not on file  Social History Narrative   Lives w/ wife   Caffeine use: 1 cup coffee every morning   Social Determinants of Health   Financial Resource Strain: Low Risk  (05/19/2022)   Overall Financial Resource Strain (CARDIA)    Difficulty of Paying Living Expenses: Not hard at all  Food Insecurity: No Food Insecurity (05/19/2022)   Hunger Vital Sign    Worried About Running Out of Food in the Last Year: Never true    Ran Out of Food in the Last Year: Never true  Transportation Needs: No Transportation Needs (05/19/2022)   PRAPARE - Hydrologist (Medical): No    Lack of Transportation (Non-Medical): No  Physical Activity: Sufficiently Active (05/19/2022)   Exercise Vital Sign    Days of Exercise per Week: 5 days    Minutes of Exercise per Session: 30 min  Stress: No Stress Concern Present (05/19/2022)   [place]    Feeling of Stress : Not at all  Social Connections: Moderately Integrated (05/19/2022)   Social Connection and Isolation Panel [NHANES]    Frequency of Communication with Friends and Family: More than three times a week    Frequency of Social Gatherings with Friends and Family: Three times a week    Attends Religious Services: More than 4 times per year    Active Member of Clubs or Organizations: No    Attends Archivist Meetings: Never    Marital Status: Married  Human resources officer Violence: Not At Risk (05/19/2022)   Humiliation, Afraid, Rape, and Kick questionnaire    Fear of Current or Ex-Partner: No    Emotionally Abused: No    Physically Abused: No    Sexually Abused: No    Outpatient Medications Prior to Visit  Medication Sig Dispense Refill   doxazosin (CARDURA) 8 MG tablet TAKE 1 TABLET AT BEDTIME 90 tablet  3   enalapril (VASOTEC) 20 MG tablet TAKE 1 TABLET EVERY DAY 90 tablet 3   hydrALAZINE (APRESOLINE) 25 MG tablet TAKE 1 TABLET TWICE DAILY 180 tablet 3   hydrochlorothiazide (HYDRODIURIL) 12.5 MG tablet Take 1 tablet (12.5 mg total) by mouth daily. 90 tablet 3   HYDROcodone-acetaminophen (NORCO/VICODIN) 5-325 MG tablet Take 1 tablet by mouth every 6 (six) hours as needed for moderate pain. 10 tablet 0   lidocaine (LIDODERM) 5 % Place 1 patch onto the skin daily. Remove & Discard patch within 12 hours or as directed by MD 30 patch  0   loratadine (CLARITIN) 10 MG tablet Take 10 mg by mouth daily as needed for allergies.      pantoprazole (PROTONIX) 40 MG tablet TAKE 1 TABLET TWICE DAILY 180 tablet 3   PARoxetine (PAXIL) 20 MG tablet TAKE 1 TABLET EVERY DAY 90 tablet 3   pravastatin (PRAVACHOL) 20 MG tablet TAKE 1 TABLET AT BEDTIME (NEED MD APPOINTMENT) 90 tablet 3   verapamil (CALAN-SR) 180 MG CR tablet TAKE 1 TABLET TWICE DAILY 180 tablet 3   benzonatate (TESSALON) 100 MG capsule Take 1 capsule (100 mg total) by mouth 2 (two) times daily as needed for cough. 20 capsule 0   doxycycline (VIBRA-TABS) 100 MG tablet Take 1 tablet (100 mg total) by mouth 2 (two) times daily. 1 po bid 14 tablet 0   meloxicam (MOBIC) 15 MG tablet Take 15 mg by mouth 3 (three) times daily.     No facility-administered medications prior to visit.    Allergies  Allergen Reactions   Sulfonamide Derivatives     REACTION: causes hives    ROS Review of Systems  Constitutional:  Negative for fatigue and fever.  Eyes:  Negative for visual disturbance.  Respiratory:  Negative for chest tightness and shortness of breath.   Cardiovascular:  Negative for chest pain and palpitations.  Musculoskeletal:  Positive for arthralgias.  Neurological:  Positive for headaches. Negative for dizziness.      Objective:    Physical Exam HENT:     Head: Normocephalic.     Right Ear: External ear normal.     Left Ear: External ear  normal.     Nose: No congestion or rhinorrhea.     Mouth/Throat:     Mouth: Mucous membranes are moist.  Cardiovascular:     Rate and Rhythm: Regular rhythm.     Heart sounds: No murmur heard. Pulmonary:     Effort: No respiratory distress.     Breath sounds: Normal breath sounds.  Musculoskeletal:     Right shoulder: Tenderness present. No swelling, deformity, effusion, laceration, bony tenderness or crepitus. Normal range of motion. Normal strength.     Left shoulder: Tenderness present. No swelling, deformity, effusion, laceration, bony tenderness or crepitus. Normal range of motion. Normal strength.  Skin:    Comments: Palpable mass in the right inguinal region  Neurological:     Mental Status: He is alert.     BP (!) 152/98 (BP Location: Left Arm)   Pulse 63   Ht '5\' 4"'$  (1.626 m)   Wt 167 lb (75.8 kg)   SpO2 95%   BMI 28.67 kg/m  Wt Readings from Last 3 Encounters:  08/07/22 167 lb (75.8 kg)  08/01/22 168 lb 1.9 oz (76.3 kg)  07/28/22 168 lb 1.9 oz (76.3 kg)    Lab Results  Component Value Date   TSH 1.270 03/02/2022   Lab Results  Component Value Date   WBC 8.4 08/01/2022   HGB 14.5 08/01/2022   HCT 42.3 08/01/2022   MCV 82.5 08/01/2022   PLT 302 08/01/2022   Lab Results  Component Value Date   NA 135 08/01/2022   K 3.0 (L) 08/01/2022   CO2 26 08/01/2022   GLUCOSE 105 (H) 08/01/2022   BUN 18 08/01/2022   CREATININE 1.11 08/01/2022   BILITOT 0.7 03/02/2022   ALKPHOS 51 03/02/2022   AST 17 03/02/2022   ALT 15 03/02/2022   PROT 5.9 (L) 03/02/2022   ALBUMIN 4.4 03/02/2022   CALCIUM 9.3 08/01/2022  ANIONGAP 9 08/01/2022   EGFR 72 03/02/2022   Lab Results  Component Value Date   CHOL 178 03/02/2022   Lab Results  Component Value Date   HDL 75 03/02/2022   Lab Results  Component Value Date   LDLCALC 93 03/02/2022   Lab Results  Component Value Date   TRIG 52 03/02/2022   Lab Results  Component Value Date   CHOLHDL 2.4 03/02/2022   Lab  Results  Component Value Date   HGBA1C 5.4 03/02/2022      Assessment & Plan:  Mass of right inguinal region Assessment & Plan: The patient owns two acres of land that he gardens on He reports constant lifting and bending He reports a palpable mass of the right inguinal region two weeks ago The mass has increased in size since noticed He denies any pain, redness, and warmth at the affected site Positive cough test Will get an u/s of the pelvis to r/o inguina hernia   Orders: -     US PELVIS LIMITED (TRANSABDOMINAL ONLY)  Bilateral shoulder pain, unspecified chronicity Assessment & Plan: Hx of two left shoulder surgeries C/o of right shoulder pain Was seen in the ED on 08/01/22 for similar complaints, with imaging studies showing mild to moderate right acromioclavicular degenerative arthritis Reports pain in the right shoulder today, 5/10 no deformity or limitation with ROM He will be following up with his orthopedics in [place] next week Encouraged to continue taking his necrotic for pain relief   Essential hypertension, benign Assessment & Plan: Elevated BP in the clinic C/o of headaches and, at times, blurred vision when watching television Of note, he has not had his eyes examined this year He was seen on 05/28/23, and HCTz was added to his medication regimen The patient reports compliance with therapy and will be following up with Dr. Court Joy on Tuesdays, 08/11/22 Encouraged to continue taking his antihypertensives and follow up with Dr. Court Joy as scheduled Encouraged to follow up with his optometrist to set an appointment to examine his vision Encouraged to tylenol as needed for headaches     Follow-up: Return if symptoms worsen or fail to improve.   Alvira Monday, FNP

## 2022-08-08 DIAGNOSIS — M25519 Pain in unspecified shoulder: Secondary | ICD-10-CM | POA: Insufficient documentation

## 2022-08-08 DIAGNOSIS — R1909 Other intra-abdominal and pelvic swelling, mass and lump: Secondary | ICD-10-CM | POA: Insufficient documentation

## 2022-08-08 NOTE — Assessment & Plan Note (Signed)
The patient owns two acres of land that he gardens on He reports constant lifting and bending He reports a palpable mass of the right inguinal region two weeks ago The mass has increased in size since noticed He denies any pain, redness, and warmth at the affected site Positive cough test Will get an u/s of the pelvis to r/o inguina hernia

## 2022-08-08 NOTE — Assessment & Plan Note (Signed)
Elevated BP in the clinic C/o of headaches and, at times, blurred vision when watching television Of note, he has not had his eyes examined this year He was seen on 05/28/23, and HCTz was added to his medication regimen The patient reports compliance with therapy and will be following up with Dr. Court Joy on Tuesdays, 08/11/22 Encouraged to continue taking his antihypertensives and follow up with Dr. Court Joy as scheduled Encouraged to follow up with his optometrist to set an appointment to examine his vision Encouraged to tylenol as needed for headaches

## 2022-08-08 NOTE — Assessment & Plan Note (Signed)
Hx of two left shoulder surgeries C/o of right shoulder pain Was seen in the ED on 08/01/22 for similar complaints, with imaging studies showing mild to moderate right acromioclavicular degenerative arthritis Reports pain in the right shoulder today, 5/10 no deformity or limitation with ROM He will be following up with his orthopedics in [place] next week Encouraged to continue taking his necrotic for pain relief

## 2022-08-11 ENCOUNTER — Ambulatory Visit (INDEPENDENT_AMBULATORY_CARE_PROVIDER_SITE_OTHER): Payer: Medicare HMO | Admitting: Internal Medicine

## 2022-08-11 ENCOUNTER — Encounter: Payer: Self-pay | Admitting: Internal Medicine

## 2022-08-11 VITALS — BP 103/62 | HR 68 | Ht 64.0 in | Wt 166.1 lb

## 2022-08-11 DIAGNOSIS — I10 Essential (primary) hypertension: Secondary | ICD-10-CM | POA: Diagnosis not present

## 2022-08-11 MED ORDER — VALSARTAN-HYDROCHLOROTHIAZIDE 160-12.5 MG PO TABS: 1.0000 | ORAL_TABLET | Freq: Every day | ORAL | 3 refills | Status: DC

## 2022-08-11 NOTE — Assessment & Plan Note (Addendum)
Hypertension: Patient's BP today is 103/62 with a goal of <140/80. The patient endorses adherence to his medication regimen of verapamil 180 mg twice daily, enalapril 20 mg daily, HCTZ 12.5 mg daily and hydralazine 25 mg twice daily.  He denies any lightheadedness or dizziness with standing.   Plan: Patient's blood pressure much better controlled today with addition of HCTZ 12.5.  His last systolic blood pressure was greater than 200 and a few weeks ago and now 103/62.  I expect patient has a big response to hydralazine or were capturing this blood pressure readings within therapeutic window's.   Stop hydralazine Stop enalapril, will start valsartan with a combination pill to lower pill burden Start valsartan 160 mg-HCTZ 12.5 mg daily Continue verapamil 180 mg twice daily Check BMP Follow-up in 2 weeks with blood pressure readings and with blood pressure monitor for [place] to check against our office reading

## 2022-08-11 NOTE — Patient Instructions (Signed)
Thank you for trusting me with your care. To recap, today we discussed the following:   Stop taking HCTZ, Hydralazine, and enalapril   Start taking Valsartan- HCTZ Continue Verapamil   Follow up in 2 weeks. Check blood pressure daily and follow up in 2 weeks for blood pressure check

## 2022-08-11 NOTE — Progress Notes (Signed)
   HPI:Adam [name] is a 72 y.o. male who presents for evaluation of Hypertension . For the details of today's visit, please refer to the assessment and plan.  Physical Exam: Vitals:   08/11/22 0807  BP: 103/62  Pulse: 68  SpO2: 94%  Weight: 166 lb 1.9 oz (75.4 kg)  Height: '5\' 4"'$  (1.626 m)     Physical Exam Constitutional:      General: He is not in acute distress.    Appearance: He is not ill-appearing.  Cardiovascular:     Rate and Rhythm: Normal rate and regular rhythm.     Heart sounds: Murmur heard.  Musculoskeletal:     Right lower leg: No edema.     Left lower leg: No edema.      Assessment & Plan:   Adam [name] was seen today for hypertension.  Essential hypertension, benign Assessment & Plan: Hypertension: Patient's BP today is 103/62 with a goal of <140/80. The patient endorses adherence to his medication regimen of verapamil 180 mg twice daily, enalapril 20 mg daily, HCTZ 12.5 mg daily and hydralazine 25 mg twice daily.  He denies any lightheadedness or dizziness with standing.   Plan: Patient's blood pressure much better controlled today with addition of HCTZ 12.5.  His last systolic blood pressure was greater than 200 and a few weeks ago and now 103/62.  I expect patient has a big response to hydralazine or were capturing this blood pressure readings within therapeutic window's.   Stop hydralazine Stop enalapril, will start valsartan with a combination pill to lower pill burden Start valsartan 160 mg-HCTZ 12.5 mg daily Continue verapamil 180 mg twice daily Check BMP Follow-up in 2 weeks with blood pressure readings and with blood pressure monitor for [place] to check against our office reading   Orders: -     Valsartan-hydroCHLOROthiazide; Take 1 tablet by mouth daily.  Dispense: 90 tablet; Refill: 3 -     BMP8+EGFR      Lorene Dy, MD

## 2022-08-12 LAB — BMP8+EGFR
BUN/Creatinine Ratio: 23 (ref 10–24)
BUN: 30 mg/dL — ABNORMAL HIGH (ref 8–27)
CO2: 24 mmol/L (ref 20–29)
Calcium: 10 mg/dL (ref 8.6–10.2)
Chloride: 102 mmol/L (ref 96–106)
Creatinine, Ser: 1.3 mg/dL — ABNORMAL HIGH (ref 0.76–1.27)
Glucose: 101 mg/dL — ABNORMAL HIGH (ref 70–99)
Potassium: 4.3 mmol/L (ref 3.5–5.2)
Sodium: 140 mmol/L (ref 134–144)
eGFR: 58 mL/min/{1.73_m2} — ABNORMAL LOW (ref >59)

## 2022-08-14 ENCOUNTER — Encounter: Payer: Self-pay | Admitting: Internal Medicine

## 2022-08-18 ENCOUNTER — Ambulatory Visit (HOSPITAL_COMMUNITY)
Admission: RE | Admit: 2022-08-18 | Discharge: 2022-08-18 | Disposition: A | Discharge status: Home / Self Care | Payer: Medicare HMO | Source: Ambulatory Visit | Attending: Family Medicine | Admitting: Family Medicine

## 2022-08-18 DIAGNOSIS — R1909 Other intra-abdominal and pelvic swelling, mass and lump: Secondary | ICD-10-CM | POA: Insufficient documentation

## 2022-08-18 DIAGNOSIS — K4090 Unilateral inguinal hernia, without obstruction or gangrene, not specified as recurrent: Secondary | ICD-10-CM | POA: Diagnosis not present

## 2022-08-18 NOTE — Progress Notes (Signed)
Please inform the patient that his ultrasound showed a small right inguinal hernia.  Surgery is the treatment of choice if symptomatic.  Please ask the patient if he would like to proceed with surgery

## 2022-08-25 ENCOUNTER — Encounter: Payer: Self-pay | Admitting: Internal Medicine

## 2022-08-25 ENCOUNTER — Ambulatory Visit (INDEPENDENT_AMBULATORY_CARE_PROVIDER_SITE_OTHER): Payer: Medicare HMO | Admitting: Internal Medicine

## 2022-08-25 VITALS — BP 142/74 | HR 67 | Resp 16 | Ht 64.0 in | Wt 166.0 lb

## 2022-08-25 DIAGNOSIS — K4090 Unilateral inguinal hernia, without obstruction or gangrene, not specified as recurrent: Secondary | ICD-10-CM | POA: Diagnosis not present

## 2022-08-25 DIAGNOSIS — I10 Essential (primary) hypertension: Secondary | ICD-10-CM | POA: Diagnosis not present

## 2022-08-25 MED ORDER — VALSARTAN-HYDROCHLOROTHIAZIDE 320-12.5 MG PO TABS: 1.0000 | ORAL_TABLET | Freq: Every day | ORAL | 0 refills | Status: DC

## 2022-08-25 NOTE — Assessment & Plan Note (Signed)
Patient recently seen by colleague and sent for ultrasound. Small right inguinal hernia containing bowel and fat seen on ultrasound. He would like a surgery evaluation and consideration of surgery. Referral placed to general surgery.

## 2022-08-25 NOTE — Progress Notes (Signed)
   HPI:[name] is a 72 y.o. male who presents for evaluation of Follow-up (2 week follow up htn ) . For the details of today's visit, please refer to the assessment and plan.  Physical Exam: Vitals:   08/25/22 0831 08/25/22 0905  BP: (!) 169/77 (!) 142/74  Pulse: 67   Resp: 16   SpO2: 95%   Weight: 166 lb (75.3 kg)   Height: 5\' 4"  (1.626 m)      Physical Exam Constitutional:      General: He is not in acute distress.    Appearance: He is not ill-appearing.  Cardiovascular:     Rate and Rhythm: Normal rate and regular rhythm.     Heart sounds: Murmur heard.  Pulmonary:     Effort: Pulmonary effort is normal. No respiratory distress.      Assessment & Plan:   Adam [name] was seen today for follow-up.  Essential hypertension, benign Assessment & Plan: Initial blood pressure 169/77 in office , improved to 142/74 on second check.  Tolerating taking valsartan-HCTZ well.  Continue verapamil 180 mg twice daily Increase valsartan -HCTZ , 320 mg-HCTZ 12.5 mg daily Check BMP Continue ambulatory BP monitoring.  Follow-up already scheduled with PCP at the beginning of April  Orders: -     Valsartan-hydroCHLOROthiazide; Take 1 tablet by mouth daily.  Dispense: 30 tablet; Refill: 0 -     BMP8+EGFR  Non-recurrent unilateral inguinal hernia without obstruction or gangrene Assessment & Plan: Patient recently seen by colleague and sent for ultrasound. Small right inguinal hernia containing bowel and fat seen on ultrasound. He would like a surgery evaluation and consideration of surgery. Referral placed to general surgery.   Orders: -     Ambulatory referral to [facility]      Lorene Dy, MD

## 2022-08-25 NOTE — Patient Instructions (Signed)
Thank you, Mr.Dae C Boulos for allowing [place] to provide your care today.   I have ordered the following labs for you:   Lab Orders         BMP8+EGFR         Reminders: Record BP and follow up with Dr.Patel in April.    Tamsen Snider, M.D.

## 2022-08-25 NOTE — Assessment & Plan Note (Addendum)
Initial blood pressure 169/77 in office , improved to 142/74 on second check.  Tolerating taking valsartan-HCTZ well.  Continue verapamil 180 mg twice daily Increase valsartan -HCTZ , 320 mg-HCTZ 12.5 mg daily Check BMP Continue ambulatory BP monitoring.  Follow-up already scheduled with PCP at the beginning of April

## 2022-08-26 LAB — BMP8+EGFR
BUN/Creatinine Ratio: 15 (ref 10–24)
BUN: 20 mg/dL (ref 8–27)
CO2: 23 mmol/L (ref 20–29)
Calcium: 9.8 mg/dL (ref 8.6–10.2)
Chloride: 101 mmol/L (ref 96–106)
Creatinine, Ser: 1.33 mg/dL — ABNORMAL HIGH (ref 0.76–1.27)
Glucose: 91 mg/dL (ref 70–99)
Potassium: 4.1 mmol/L (ref 3.5–5.2)
Sodium: 141 mmol/L (ref 134–144)
eGFR: 56 mL/min/{1.73_m2} — ABNORMAL LOW (ref >59)

## 2022-09-09 ENCOUNTER — Ambulatory Visit (INDEPENDENT_AMBULATORY_CARE_PROVIDER_SITE_OTHER): Payer: Medicare HMO | Admitting: Internal Medicine

## 2022-09-09 ENCOUNTER — Encounter: Payer: Self-pay | Admitting: Internal Medicine

## 2022-09-09 ENCOUNTER — Ambulatory Visit: Payer: Medicare HMO | Admitting: Internal Medicine

## 2022-09-09 VITALS — BP 135/77 | HR 78 | Ht 64.0 in | Wt 163.0 lb

## 2022-09-09 DIAGNOSIS — F3341 Major depressive disorder, recurrent, in partial remission: Secondary | ICD-10-CM

## 2022-09-09 DIAGNOSIS — K4090 Unilateral inguinal hernia, without obstruction or gangrene, not specified as recurrent: Secondary | ICD-10-CM

## 2022-09-09 DIAGNOSIS — N400 Enlarged prostate without lower urinary tract symptoms: Secondary | ICD-10-CM | POA: Diagnosis not present

## 2022-09-09 DIAGNOSIS — I350 Nonrheumatic aortic (valve) stenosis: Secondary | ICD-10-CM

## 2022-09-09 DIAGNOSIS — Z1159 Encounter for screening for other viral diseases: Secondary | ICD-10-CM | POA: Diagnosis not present

## 2022-09-09 DIAGNOSIS — I10 Essential (primary) hypertension: Secondary | ICD-10-CM | POA: Diagnosis not present

## 2022-09-09 MED ORDER — VALSARTAN-HYDROCHLOROTHIAZIDE 320-25 MG PO TABS: 1.0000 | ORAL_TABLET | Freq: Every day | ORAL | 3 refills | Status: DC

## 2022-09-09 NOTE — Assessment & Plan Note (Signed)
BP Readings from Last 1 Encounters:  09/09/22 135/77   Well-controlled with Verapamil 180 mg BID and Valsartan-HCTZ 320-12.5 mg QD and Doxazosin 8 mh QD Counseled for compliance with the medications Advised DASH diet and moderate exercise/walking, at least 150 mins/week

## 2022-09-09 NOTE — Assessment & Plan Note (Signed)
On Doxazosin PSA wnl 

## 2022-09-09 NOTE — Progress Notes (Unsigned)
Established Patient Office Visit  Subjective:  Patient ID: Adam [name], male    DOB: 1951/05/30  Age: 72 y.o. MRN: [id number]  CC:  Chief Complaint  Patient presents with   Hypertension    Six month follow up, patient states he has a hernia he would like checked out.    HPI Adam [name] is a 72 y.o. male with past medical history of HTN, GERD, eosinophilic gastritis, BPH, HLD and depression who presents for f/u of his chronic medical conditions.  HTN: BP is well-controlled. Takes medications regularly. Patient denies headache, dizziness, chest pain, dyspnea or palpitations.      Past Medical History:  Diagnosis Date   Allergy    BMI 31.0-31.9,adult JUNE 2011 180 LBS    Chronic back pain    Depression    Eosinophilic granuloma JAN [id number] GASTRIC, followed by Dr. Eugenia Pancoast   EGD JAN 2011, JUN 2011, NOV 2011   Gastric ulcer 08/31/2012   GERD (gastroesophageal reflux disease)    HTN (hypertension)    Hyperlipemia    IBS (irritable bowel syndrome)    Prostate hypertrophy     Past Surgical History:  Procedure Laterality Date   BACK SURGERY     COLONOSCOPY  JAN 2011 COMPLETE   hypoTN and bradycardia w/ Demerol ,Phenergan and Versed, [place] TICS, SML IH   COLONOSCOPY  2003   NUR-normal   COLONOSCOPY  06/21/09   Fields-sigmoid diverticulosis, sm int hemorrhoids   COLONOSCOPY N/A 10/24/2013   Normal mucosa in the terminal ileum Moderate diverticulosis noted in the sigmoid colonModerate sized internal hemorrhoids. negative random colon bx.. next TCS 10/2023   Cyst Removed     from Left leg and knee   ESOPHAGOGASTRODUODENOSCOPY  05/20/2012   SLF: MILD ESOPHAGITIS.NO BARRETT'S/Multiple ulcers ranging between 3-5 mm/MILD Duodenal inflammation was found in the duodenal bulb   ESOPHAGOGASTRODUODENOSCOPY N/A 09/05/2012   SLF: MILD Non-erosive gastritis (inflammation) in the gastric antrum. Biopsies benign, no H. pylori.   ESOPHAGOGASTRODUODENOSCOPY N/A 10/24/2013    [id number] DISTAL ESOPHAGEAL WEBSmall hiatal herniaMILD Non-erosive gastritis. SB bx benign. gastric bx, minimal inflammation.    GANGLION CYST EXCISION     from L wrist and pins placed for Fx involving L thumb   HAND SURGERY  at the age of 89   Brother accidentally cut it   MALONEY DILATION N/A 10/24/2013   Procedure: Venia Minks DILATION;  Surgeon: Danie Binder, MD;  [facility];  Service: Endoscopy;  Laterality: N/A;   MASS EXCISION  04/29/2012   Procedure: EXCISION MASS;  Surgeon: Donato Heinz, MD;  Location: [facility];  Service: General;  Laterality: Right;  Excision of Vascular Mass Right Arm   SAVORY DILATION N/A 10/24/2013   Procedure: SAVORY DILATION;  Surgeon: Danie Binder, MD;  Location: [facility];  Service: Endoscopy;  Laterality: N/A;   SHOULDER SURGERY Left 2017   TUMOR REMOVAL  1994   Benign from brain at [place]   GASTRIC ULCER-EOS GRANULOMA   UPPER GASTROINTESTINAL ENDOSCOPY  JUN 2011   GASTRIC ULCER- EOS, NO H. PYLORI   UPPER GASTROINTESTINAL ENDOSCOPY  NOV 2011   GASTRIC NODULE, no ulcer- EOS    Family History  Problem Relation Age of Onset   Hypertension Mother    Diabetes Mother    Hyperlipidemia Mother    Thyroid disease Mother    Hypertension Father    Diabetes Father    Stroke Father  Colon cancer Maternal Grandmother        OVER 24    Social History   Socioeconomic History   Marital status: Married    Spouse name: [name]   Number of children: 1   Years of education: 12   Highest education level: Not on file  Occupational History   Occupation: disabled  Tobacco Use   Smoking status: Former    Packs/day: 0.25    Years: 15.00    Additional pack years: 0.00    Total pack years: 3.75    Types: Cigars, Cigarettes    Quit date: 04/29/2002    Years since quitting: 20.3   Smokeless tobacco: Never  Substance and Sexual Activity   Alcohol use: Not Currently   Drug use: No    Sexual activity: Yes    Birth control/protection: None  Other Topics Concern   Not on file  Social History Narrative   Lives w/ wife   Caffeine use: 1 cup coffee every morning   Social Determinants of Health   Financial Resource Strain: Low Risk  (05/19/2022)   Overall Financial Resource Strain (CARDIA)    Difficulty of Paying Living Expenses: Not hard at all  Food Insecurity: No Food Insecurity (05/19/2022)   Hunger Vital Sign    Worried About Running Out of Food in the Last Year: Never true    Ran Out of Food in the Last Year: Never true  Transportation Needs: No Transportation Needs (05/19/2022)   PRAPARE - Hydrologist (Medical): No    Lack of Transportation (Non-Medical): No  Physical Activity: Sufficiently Active (05/19/2022)   Exercise Vital Sign    Days of Exercise per Week: 5 days    Minutes of Exercise per Session: 30 min  Stress: No Stress Concern Present (05/19/2022)   [place]    Feeling of Stress : Not at all  Social Connections: Moderately Integrated (05/19/2022)   Social Connection and Isolation Panel [NHANES]    Frequency of Communication with Friends and Family: More than three times a week    Frequency of Social Gatherings with Friends and Family: Three times a week    Attends Religious Services: More than 4 times per year    Active Member of Clubs or Organizations: No    Attends Archivist Meetings: Never    Marital Status: Married  Human resources officer Violence: Not At Risk (05/19/2022)   Humiliation, Afraid, Rape, and Kick questionnaire    Fear of Current or Ex-Partner: No    Emotionally Abused: No    Physically Abused: No    Sexually Abused: No    Outpatient Medications Prior to Visit  Medication Sig Dispense Refill   doxazosin (CARDURA) 8 MG tablet TAKE 1 TABLET AT BEDTIME 90 tablet 3   loratadine (CLARITIN) 10 MG tablet Take 10 mg by mouth daily  as needed for allergies.      pantoprazole (PROTONIX) 40 MG tablet TAKE 1 TABLET TWICE DAILY 180 tablet 3   PARoxetine (PAXIL) 20 MG tablet TAKE 1 TABLET EVERY DAY 90 tablet 3   pravastatin (PRAVACHOL) 20 MG tablet TAKE 1 TABLET AT BEDTIME (NEED MD APPOINTMENT) 90 tablet 3   valsartan-hydrochlorothiazide (DIOVAN-HCT) 320-12.5 MG tablet Take 1 tablet by mouth daily. 30 tablet 0   verapamil (CALAN-SR) 180 MG CR tablet TAKE 1 TABLET TWICE DAILY 180 tablet 3   HYDROcodone-acetaminophen (NORCO/VICODIN) 5-325 MG tablet Take 1 tablet by mouth every 6 (  six) hours as needed for moderate pain. (Patient not taking: Reported on 09/09/2022) 10 tablet 0   lidocaine (LIDODERM) 5 % Place 1 patch onto the skin daily. Remove & Discard patch within 12 hours or as directed by MD (Patient not taking: Reported on 09/09/2022) 30 patch 0   No facility-administered medications prior to visit.    Allergies  Allergen Reactions   Sulfonamide Derivatives     REACTION: causes hives    ROS Review of Systems  Constitutional:  Negative for chills and fever.  HENT:  Negative for congestion and sore throat.   Eyes:  Negative for pain and discharge.  Respiratory:  Negative for cough and shortness of breath.   Cardiovascular:  Negative for chest pain and palpitations.  Gastrointestinal:  Negative for constipation, diarrhea, nausea and vomiting.  Endocrine: Negative for polydipsia and polyuria.  Genitourinary:  Negative for dysuria and hematuria.  Musculoskeletal:  Positive for arthralgias. Negative for neck pain and neck stiffness.  Skin:  Positive for rash.  Neurological:  Negative for dizziness, weakness, numbness and headaches.  Psychiatric/Behavioral:  Negative for agitation and behavioral problems.       Objective:    Physical Exam Vitals reviewed.  Constitutional:      General: He is not in acute distress.    Appearance: He is not diaphoretic.  HENT:     Head: Normocephalic and atraumatic.     Nose: Nose  normal.     Mouth/Throat:     Mouth: Mucous membranes are moist.  Eyes:     General: No scleral icterus.    Extraocular Movements: Extraocular movements intact.  Cardiovascular:     Rate and Rhythm: Normal rate and regular rhythm.     Pulses: Normal pulses.     Heart sounds: Murmur heard.  Pulmonary:     Breath sounds: Normal breath sounds. No wheezing or rales.  Musculoskeletal:     Cervical back: Neck supple. No tenderness.     Right lower leg: No edema.     Left lower leg: No edema.  Skin:    General: Skin is warm.     Findings: Rash (Erythematous patch, circular, about 2 cm in diameter) present.  Neurological:     General: No focal deficit present.     Mental Status: He is alert and oriented to person, place, and time.  Psychiatric:        Mood and Affect: Mood normal.        Behavior: Behavior normal.     BP 135/77 (BP Location: Right Arm, Patient Position: Sitting, Cuff Size: Normal)   Pulse 78   Ht 5\' 4"  (1.626 m)   Wt 163 lb (73.9 kg)   SpO2 94%   BMI 27.98 kg/m  Wt Readings from Last 3 Encounters:  09/09/22 163 lb (73.9 kg)  08/25/22 166 lb (75.3 kg)  08/11/22 166 lb 1.9 oz (75.4 kg)    Lab Results  Component Value Date   TSH 1.270 03/02/2022   Lab Results  Component Value Date   WBC 8.4 08/01/2022   HGB 14.5 08/01/2022   HCT 42.3 08/01/2022   MCV 82.5 08/01/2022   PLT 302 08/01/2022   Lab Results  Component Value Date   NA 141 08/25/2022   K 4.1 08/25/2022   CO2 23 08/25/2022   GLUCOSE 91 08/25/2022   BUN 20 08/25/2022   CREATININE 1.33 (H) 08/25/2022   BILITOT 0.7 03/02/2022   ALKPHOS 51 03/02/2022   AST 17 03/02/2022  ALT 15 03/02/2022   PROT 5.9 (L) 03/02/2022   ALBUMIN 4.4 03/02/2022   CALCIUM 9.8 08/25/2022   ANIONGAP 9 08/01/2022   EGFR 56 (L) 08/25/2022   Lab Results  Component Value Date   CHOL 178 03/02/2022   Lab Results  Component Value Date   HDL 75 03/02/2022   Lab Results  Component Value Date   LDLCALC 93  03/02/2022   Lab Results  Component Value Date   TRIG 52 03/02/2022   Lab Results  Component Value Date   CHOLHDL 2.4 03/02/2022   Lab Results  Component Value Date   HGBA1C 5.4 03/02/2022      Assessment & Plan:   Problem List Items Addressed This Visit   None  No orders of the defined types were placed in this encounter.   Follow-up: No follow-ups on file.    Lindell Spar, MD

## 2022-09-09 NOTE — Assessment & Plan Note (Signed)
Echo reviewed, asymptomatic currently Will refer to cardiology if he has dyspnea, palpitations, LE swelling or dizziness 

## 2022-09-09 NOTE — Patient Instructions (Signed)
Please start taking Valsartan-HCTZ 320-25 mg and stop taking separate HCTZ.  Please continue to follow low salt diet and perform moderate exercise/walking at least 150 mins/week.

## 2022-09-10 NOTE — Assessment & Plan Note (Signed)
Right-sided inguinal hernia, without gangrene or obstruction Has been referred to general surgery Advised to avoid heavy lifting and frequent bending

## 2022-09-10 NOTE — Assessment & Plan Note (Signed)
Well-controlled On Paxil

## 2022-09-15 ENCOUNTER — Ambulatory Visit: Payer: Medicare HMO | Admitting: Surgery

## 2022-09-15 ENCOUNTER — Encounter: Payer: Self-pay | Admitting: Surgery

## 2022-09-15 VITALS — BP 124/76 | HR 57 | Temp 98.0°F | Resp 14 | Ht 64.0 in | Wt 165.0 lb

## 2022-09-15 DIAGNOSIS — K4090 Unilateral inguinal hernia, without obstruction or gangrene, not specified as recurrent: Secondary | ICD-10-CM

## 2022-09-18 NOTE — Progress Notes (Signed)
[facility] History and Physical  Reason for Referral: Right inguinal hernia Referring Physician: Dr. Steen  Chief Complaint   New Patient (Initial Visit)     Adam [name] is a 72 y.o. male.  HPI: Patient presents for evaluation of a right inguinal hernia.  He has had a bulge in his right groin for about a month, and it is becoming more prominent especially with straining.  He has never tried to reduce the bulge.  He has occasional pain associated with the hernia when he is straining.  He denies ever having anything like this previously.  He denies nausea and vomiting.  He has some baseline constipation, but his last bowel movement was this morning.  His past medical history significant for hypertension, hyperlipidemia, GERD, and depression.  He takes an 81 mg aspirin daily.  His surgical history significant for brain surgery, left shoulder surgery, and back surgeries.  He denies any history of abdominal surgeries.  He denies use of tobacco products, alcohol, and illicit drugs.  Past Medical History:  Diagnosis Date   Allergy    BMI 31.0-31.9,adult JUNE 2011 180 LBS    Chronic back pain    Depression    Eosinophilic granuloma JAN 2011 GASTRIC, followed by Dr. Howerton   EGD JAN 2011, JUN 2011, NOV 2011   Gastric ulcer 08/31/2012   GERD (gastroesophageal reflux disease)    HTN (hypertension)    Hyperlipemia    IBS (irritable bowel syndrome)    Prostate hypertrophy     Past Surgical History:  Procedure Laterality Date   BACK SURGERY     COLONOSCOPY  JAN 2011 COMPLETE   hypoTN and bradycardia w/ Demerol ,Phenergan and Versed, [place] TICS, SML IH   COLONOSCOPY  2003   NUR-normal   COLONOSCOPY  06/21/09   Fields-sigmoid diverticulosis, sm int hemorrhoids   COLONOSCOPY N/A 10/24/2013   Normal mucosa in the terminal ileum Moderate diverticulosis noted in the sigmoid colonModerate sized internal hemorrhoids. negative random colon bx.. next TCS 10/2023   Cyst Removed      from Left leg and knee   ESOPHAGOGASTRODUODENOSCOPY  05/20/2012   SLF: MILD ESOPHAGITIS.NO BARRETT'S/Multiple ulcers ranging between 3-5 mm/MILD Duodenal inflammation was found in the duodenal bulb   ESOPHAGOGASTRODUODENOSCOPY N/A 09/05/2012   SLF: MILD Non-erosive gastritis (inflammation) in the gastric antrum. Biopsies benign, no H. pylori.   ESOPHAGOGASTRODUODENOSCOPY N/A 10/24/2013   SLF:POSSIBLE DISTAL ESOPHAGEAL WEBSmall hiatal herniaMILD Non-erosive gastritis. SB bx benign. gastric bx, minimal inflammation.    GANGLION CYST EXCISION     from L wrist and pins placed for Fx involving L thumb   HAND SURGERY  at the age of 3   Brother accidentally cut it   MALONEY DILATION N/A 10/24/2013   Procedure: MALONEY DILATION;  Surgeon: [name], MD;  [facility];  Service: Endoscopy;  Laterality: N/A;   MASS EXCISION  04/29/2012   Procedure: EXCISION MASS;  Surgeon: Brent C Ziegler, MD;  Location: [facility];  Service: General;  Laterality: Right;  Excision of Vascular Mass Right Arm   SAVORY DILATION N/A 10/24/2013   Procedure: SAVORY DILATION;  Surgeon: [name], MD;  [facility];  Service: Endoscopy;  Laterality: N/A;   SHOULDER SURGERY Left 2017   TUMOR REMOVAL  1994   Benign from brain at [facility]   UPPER GASTROINTESTINAL ENDOSCOPY  JAN 2011   GASTRIC ULCER-EOS GRANULOMA   UPPER GASTROINTESTINAL ENDOSCOPY  JUN 2011   GASTRIC ULCER-   EOS, NO H. PYLORI   UPPER GASTROINTESTINAL ENDOSCOPY  NOV 2011   GASTRIC NODULE, no ulcer- EOS    Family History  Problem Relation Age of Onset   Hypertension Mother    Diabetes Mother    Hyperlipidemia Mother    Thyroid disease Mother    Hypertension Father    Diabetes Father    Stroke Father    Colon cancer Maternal Grandmother        OVER 60    Social History   Tobacco Use   Smoking status: Former    Packs/day: 0.25    Years: 15.00    Additional pack years: 0.00    Total pack years: 3.75    Types:  Cigars, Cigarettes    Quit date: 04/29/2002    Years since quitting: 20.4   Smokeless tobacco: Never  Substance Use Topics   Alcohol use: Not Currently   Drug use: No    Medications: I have reviewed the patient's current medications. Allergies as of 09/15/2022       Reactions   Sulfonamide Derivatives    REACTION: causes hives        Medication List        Accurate as of September 15, 2022 11:59 PM. If you have any questions, ask your nurse or doctor.          STOP taking these medications    HYDROcodone-acetaminophen 5-325 MG tablet Commonly known as: NORCO/VICODIN Stopped by: Natividad Schlosser A Lynetta Tomczak, DO   lidocaine 5 % Commonly known as: Lidoderm Stopped by: Edgar Reisz A Ritaj Dullea, DO       TAKE these medications    doxazosin 8 MG tablet Commonly known as: CARDURA TAKE 1 TABLET AT BEDTIME   loratadine 10 MG tablet Commonly known as: CLARITIN Take 10 mg by mouth daily as needed for allergies.   pantoprazole 40 MG tablet Commonly known as: PROTONIX TAKE 1 TABLET TWICE DAILY   PARoxetine 20 MG tablet Commonly known as: PAXIL TAKE 1 TABLET EVERY DAY   pravastatin 20 MG tablet Commonly known as: PRAVACHOL TAKE 1 TABLET AT BEDTIME (NEED MD APPOINTMENT)   valsartan-hydrochlorothiazide 320-25 MG tablet Commonly known as: DIOVAN-HCT Take 1 tablet by mouth daily.   verapamil 180 MG CR tablet Commonly known as: CALAN-SR TAKE 1 TABLET TWICE DAILY         ROS:  Constitutional: negative for chills, fatigue, and fevers Eyes: negative for visual disturbance and pain Ears, nose, mouth, throat, and face: negative for ear drainage, sore throat, and sinus problems Respiratory: negative for cough, wheezing, and shortness of breath Cardiovascular: negative for chest pain and palpitations Gastrointestinal: negative for abdominal pain, nausea, reflux symptoms, and vomiting Genitourinary:negative for dysuria and frequency Integument/breast: negative for dryness  and rash Hematologic/lymphatic: negative for bleeding and lymphadenopathy Musculoskeletal:negative for back pain and neck pain Neurological: negative for dizziness and tremors Endocrine: negative for temperature intolerance  Blood pressure 124/76, pulse (!) 57, temperature 98 F (36.7 C), temperature source Oral, resp. rate 14, height 5' 4" (1.626 m), weight 165 lb (74.8 kg), SpO2 94 %. Physical Exam Vitals reviewed.  Constitutional:      Appearance: Normal appearance.  HENT:     Head: Normocephalic and atraumatic.  Eyes:     Extraocular Movements: Extraocular movements intact.     Pupils: Pupils are equal, round, and reactive to light.  Cardiovascular:     Rate and Rhythm: Normal rate and regular rhythm.     Heart sounds: Murmur heard.  Pulmonary:       Effort: Pulmonary effort is normal.     Breath sounds: Normal breath sounds.  Abdominal:     Comments: Abdomen soft, nondistended, no percussion tenderness, nontender to palpation; no rigidity, guarding, rebound tenderness; soft and reducible right inguinal hernia, no palpable left inguinal hernia  Musculoskeletal:        General: Normal range of motion.     Cervical back: Normal range of motion.  Skin:    General: Skin is warm and dry.  Neurological:     General: No focal deficit present.     Mental Status: He is alert and oriented to person, place, and time.  Psychiatric:        Mood and Affect: Mood normal.        Behavior: Behavior normal.     Results: No results found for this or any previous visit (from the past 48 hour(s)).  No results found.   Assessment & Plan:  Merwin C Brothers is a 71 y.o. male who presents for evaluation of a right inguinal hernia.  -We discussed the pathophysiology of hernias, and why we recommend surgical repair -The risk and benefits of robotic assisted laparoscopic right inguinal hernia repair with mesh were discussed including but not limited to bleeding, infection, injury to  surrounding structures, need for additional procedures, and hernia recurrence.  After careful consideration, Joushua C Neira has decided to proceed with surgery.  -Patient tentatively scheduled for surgery on 4/19 -Information provided to the patient regarding inguinal hernias -Advised that he needs to present to the emergency department if he begins to have worsening pain at his hernia, nonreducible bulge, nausea, vomiting, and obstipation  All questions were answered to the satisfaction of the patient and family.  Sueko Dimichele, DO [facility] [address] [place], [place] [address] [phone] (office) 

## 2022-09-18 NOTE — H&P (Signed)
[facility] History and Physical  Reason for Referral: Right inguinal hernia Referring Physician: Dr. Barbaraann Faster  Chief Complaint   New Patient (Initial Visit)     Adam [name] is a 72 y.o. male.  HPI: Patient presents for evaluation of a right inguinal hernia.  He has had a bulge in his right groin for about a month, and it is becoming more prominent especially with straining.  He has never tried to reduce the bulge.  He has occasional pain associated with the hernia when he is straining.  He denies ever having anything like this previously.  He denies nausea and vomiting.  He has some baseline constipation, but his last bowel movement was this morning.  His past medical history significant for hypertension, hyperlipidemia, GERD, and depression.  He takes an 81 mg aspirin daily.  His surgical history significant for brain surgery, left shoulder surgery, and back surgeries.  He denies any history of abdominal surgeries.  He denies use of tobacco products, alcohol, and illicit drugs.  Past Medical History:  Diagnosis Date   Allergy    BMI 31.0-31.9,adult JUNE 2011 180 LBS    Chronic back pain    Depression    Eosinophilic granuloma JAN 2011 GASTRIC, followed by Dr. [name]   EGD JAN 2011, JUN 2011, NOV 2011   Gastric ulcer 08/31/2012   GERD (gastroesophageal reflux disease)    HTN (hypertension)    Hyperlipemia    IBS (irritable bowel syndrome)    Prostate hypertrophy     Past Surgical History:  Procedure Laterality Date   BACK SURGERY     COLONOSCOPY  JAN 2011 COMPLETE   hypoTN and bradycardia w/ Demerol ,Phenergan and Versed, [place] TICS, SML IH   COLONOSCOPY  2003   NUR-normal   COLONOSCOPY  06/21/09   Fields-sigmoid diverticulosis, sm int hemorrhoids   COLONOSCOPY N/A 10/24/2013   Normal mucosa in the terminal ileum Moderate diverticulosis noted in the sigmoid colonModerate sized internal hemorrhoids. negative random colon bx.. next TCS 10/2023   Cyst Removed      from Left leg and knee   ESOPHAGOGASTRODUODENOSCOPY  05/20/2012   SLF: MILD ESOPHAGITIS.NO BARRETT'S/Multiple ulcers ranging between 3-5 mm/MILD Duodenal inflammation was found in the duodenal bulb   ESOPHAGOGASTRODUODENOSCOPY N/A 09/05/2012   SLF: MILD Non-erosive gastritis (inflammation) in the gastric antrum. Biopsies benign, no H. pylori.   ESOPHAGOGASTRODUODENOSCOPY N/A 10/24/2013   ZOX:[id number] DISTAL ESOPHAGEAL WEBSmall hiatal herniaMILD Non-erosive gastritis. SB bx benign. gastric bx, minimal inflammation.    GANGLION CYST EXCISION     from L wrist and pins placed for Fx involving L thumb   HAND SURGERY  at the age of 3   Brother accidentally cut it   MALONEY DILATION N/A 10/24/2013   Procedure: Elease Hashimoto DILATION;  Surgeon: [name], MD;  Location: [facility];  Service: Endoscopy;  Laterality: N/A;   MASS EXCISION  04/29/2012   Procedure: EXCISION MASS;  Surgeon: [name], MD;  Location: [facility] ORS;  Service: General;  Laterality: Right;  Excision of Vascular Mass Right Arm   SAVORY DILATION N/A 10/24/2013   Procedure: SAVORY DILATION;  Surgeon: [name], MD;  Location: [facility];  Service: Endoscopy;  Laterality: N/A;   SHOULDER SURGERY Left 2017   TUMOR REMOVAL  1994   [facility] brain [facility]   UPPER GASTROINTESTINAL ENDOSCOPY  JAN 2011   GASTRIC ULCER-EOS GRANULOMA   UPPER GASTROINTESTINAL ENDOSCOPY  JUN 2011   GASTRIC ULCER-  EOS, NO H. PYLORI   UPPER GASTROINTESTINAL ENDOSCOPY  NOV 2011   GASTRIC NODULE, no ulcer- EOS    Family History  Problem Relation Age of Onset   Hypertension Mother    Diabetes Mother    Hyperlipidemia Mother    Thyroid disease Mother    Hypertension Father    Diabetes Father    Stroke Father    Colon cancer Maternal Grandmother        OVER 70    Social History   Tobacco Use   Smoking status: Former    Packs/day: 0.25    Years: 15.00    Additional pack years: 0.00    Total pack years: 3.75    Types:  Cigars, Cigarettes    Quit date: 04/29/2002    Years since quitting: 20.4   Smokeless tobacco: Never  Substance Use Topics   Alcohol use: Not Currently   Drug use: No    Medications: I have reviewed the patient's current medications. Allergies as of 09/15/2022       Reactions   Sulfonamide Derivatives    REACTION: causes hives        Medication List        Accurate as of September 15, 2022 11:59 PM. If you have any questions, ask your nurse or doctor.          STOP taking these medications    HYDROcodone-acetaminophen 5-325 MG tablet Commonly known as: NORCO/VICODIN Stopped by: Darrill Vreeland A Annalese Stiner, DO   lidocaine 5 % Commonly known as: Lidoderm Stopped by: Boone Gear A Jaylee Lantry, DO       TAKE these medications    doxazosin 8 MG tablet Commonly known as: CARDURA TAKE 1 TABLET AT BEDTIME   loratadine 10 MG tablet Commonly known as: CLARITIN Take 10 mg by mouth daily as needed for allergies.   pantoprazole 40 MG tablet Commonly known as: PROTONIX TAKE 1 TABLET TWICE DAILY   PARoxetine 20 MG tablet Commonly known as: PAXIL TAKE 1 TABLET EVERY DAY   pravastatin 20 MG tablet Commonly known as: PRAVACHOL TAKE 1 TABLET AT BEDTIME (NEED MD APPOINTMENT)   valsartan-hydrochlorothiazide 320-25 MG tablet Commonly known as: DIOVAN-HCT Take 1 tablet by mouth daily.   verapamil 180 MG CR tablet Commonly known as: CALAN-SR TAKE 1 TABLET TWICE DAILY         ROS:  Constitutional: negative for chills, fatigue, and fevers Eyes: negative for visual disturbance and pain Ears, nose, mouth, throat, and face: negative for ear drainage, sore throat, and sinus problems Respiratory: negative for cough, wheezing, and shortness of breath Cardiovascular: negative for chest pain and palpitations Gastrointestinal: negative for abdominal pain, nausea, reflux symptoms, and vomiting Genitourinary:negative for dysuria and frequency Integument/breast: negative for dryness  and rash Hematologic/lymphatic: negative for bleeding and lymphadenopathy Musculoskeletal:negative for back pain and neck pain Neurological: negative for dizziness and tremors Endocrine: negative for temperature intolerance  Blood pressure 124/76, pulse (!) 57, temperature 98 F (36.7 C), temperature source Oral, resp. rate 14, height  (1.626 m), weight 165 lb (74.8 kg), SpO2 94 %. Physical Exam Vitals reviewed.  Constitutional:      Appearance: Normal appearance.  HENT:     Head: Normocephalic and atraumatic.  Eyes:     Extraocular Movements: Extraocular movements intact.     Pupils: Pupils are equal, round, and reactive to light.  Cardiovascular:     Rate and Rhythm: Normal rate and regular rhythm.     Heart sounds: Murmur heard.  Pulmonary:  Effort: Pulmonary effort is normal.     Breath sounds: Normal breath sounds.  Abdominal:     Comments: Abdomen soft, nondistended, no percussion tenderness, nontender to palpation; no rigidity, guarding, rebound tenderness; soft and reducible right inguinal hernia, no palpable left inguinal hernia  Musculoskeletal:        General: Normal range of motion.     Cervical back: Normal range of motion.  Skin:    General: Skin is warm and dry.  Neurological:     General: No focal deficit present.     Mental Status: He is alert and oriented to person, place, and time.  Psychiatric:        Mood and Affect: Mood normal.        Behavior: Behavior normal.     Results: No results found for this or any previous visit (from the past 48 hour(s)).  No results found.   Assessment & Plan:  Adam [name] is a 72 y.o. male who presents for evaluation of a right inguinal hernia.  -We discussed the pathophysiology of hernias, and why we recommend surgical repair -The risk and benefits of robotic assisted laparoscopic right inguinal hernia repair with mesh were discussed including but not limited to bleeding, infection, injury to  surrounding structures, need for additional procedures, and hernia recurrence.  After careful consideration, Adam [name] has decided to proceed with surgery.  -Patient tentatively scheduled for surgery on 4/19 -Information provided to the patient regarding inguinal hernias -Advised that he needs to present to the emergency department if he begins to have worsening pain at his hernia, nonreducible bulge, nausea, vomiting, and obstipation  All questions were answered to the satisfaction of the patient and family.  Theophilus Kinds, DO [facility] [address] [place], [place] [address] [phone] (office)

## 2022-09-23 ENCOUNTER — Encounter (HOSPITAL_COMMUNITY)
Admission: RE | Admit: 2022-09-23 | Discharge: 2022-09-23 | Disposition: A | Discharge status: Home / Self Care | Payer: Medicare HMO | Source: Ambulatory Visit | Attending: Surgery | Admitting: Surgery

## 2022-09-23 ENCOUNTER — Other Ambulatory Visit: Payer: Self-pay

## 2022-09-23 ENCOUNTER — Encounter (HOSPITAL_COMMUNITY): Payer: Self-pay

## 2022-09-25 ENCOUNTER — Other Ambulatory Visit: Payer: Self-pay

## 2022-09-25 ENCOUNTER — Encounter (HOSPITAL_COMMUNITY)
Admission: RE | Disposition: A | Discharge status: Home / Self Care | Payer: Self-pay | Source: Home / Self Care | Attending: Surgery

## 2022-09-25 ENCOUNTER — Ambulatory Visit (HOSPITAL_COMMUNITY)
Admission: RE | Admit: 2022-09-25 | Discharge: 2022-09-25 | Disposition: A | Discharge status: Home / Self Care | Payer: Medicare HMO | Attending: Surgery | Admitting: Surgery

## 2022-09-25 ENCOUNTER — Ambulatory Visit (HOSPITAL_BASED_OUTPATIENT_CLINIC_OR_DEPARTMENT_OTHER): Payer: Medicare HMO | Admitting: Anesthesiology

## 2022-09-25 ENCOUNTER — Ambulatory Visit (HOSPITAL_COMMUNITY): Payer: Medicare HMO | Admitting: Anesthesiology

## 2022-09-25 ENCOUNTER — Encounter (HOSPITAL_COMMUNITY): Payer: Self-pay | Admitting: Surgery

## 2022-09-25 DIAGNOSIS — I10 Essential (primary) hypertension: Secondary | ICD-10-CM

## 2022-09-25 DIAGNOSIS — Z7982 Long term (current) use of aspirin: Secondary | ICD-10-CM | POA: Diagnosis not present

## 2022-09-25 DIAGNOSIS — Z87891 Personal history of nicotine dependence: Secondary | ICD-10-CM | POA: Diagnosis not present

## 2022-09-25 DIAGNOSIS — Z8711 Personal history of peptic ulcer disease: Secondary | ICD-10-CM | POA: Diagnosis not present

## 2022-09-25 DIAGNOSIS — G709 Myoneural disorder, unspecified: Secondary | ICD-10-CM

## 2022-09-25 DIAGNOSIS — K4090 Unilateral inguinal hernia, without obstruction or gangrene, not specified as recurrent: Secondary | ICD-10-CM

## 2022-09-25 DIAGNOSIS — M1990 Unspecified osteoarthritis, unspecified site: Secondary | ICD-10-CM | POA: Insufficient documentation

## 2022-09-25 DIAGNOSIS — Z79899 Other long term (current) drug therapy: Secondary | ICD-10-CM | POA: Diagnosis not present

## 2022-09-25 DIAGNOSIS — E785 Hyperlipidemia, unspecified: Secondary | ICD-10-CM | POA: Diagnosis not present

## 2022-09-25 DIAGNOSIS — K219 Gastro-esophageal reflux disease without esophagitis: Secondary | ICD-10-CM | POA: Insufficient documentation

## 2022-09-25 DIAGNOSIS — I350 Nonrheumatic aortic (valve) stenosis: Secondary | ICD-10-CM | POA: Diagnosis not present

## 2022-09-25 HISTORY — PX: XI ROBOTIC ASSISTED INGUINAL HERNIA REPAIR WITH MESH: SHX6706

## 2022-09-25 SURGERY — REPAIR, HERNIA, INGUINAL, ROBOT-ASSISTED, LAPAROSCOPIC, USING MESH
Anesthesia: General | Site: Abdomen | Laterality: Right

## 2022-09-25 MED ORDER — DEXAMETHASONE SODIUM PHOSPHATE 10 MG/ML IJ SOLN
INTRAMUSCULAR | Status: AC
  Filled 2022-09-25: qty 1

## 2022-09-25 MED ORDER — PHENYLEPHRINE HCL-NACL 20-0.9 MG/250ML-% IV SOLN
INTRAVENOUS | Status: DC | PRN
  Administered 2022-09-25: 3 ug/min via INTRAVENOUS

## 2022-09-25 MED ORDER — CHLORHEXIDINE GLUCONATE CLOTH 2 % EX PADS: 6.0000 | MEDICATED_PAD | Freq: Once | CUTANEOUS | Status: DC

## 2022-09-25 MED ORDER — ORAL CARE MOUTH RINSE: 15.0000 mL | Freq: Once | OROMUCOSAL | Status: AC

## 2022-09-25 MED ORDER — LACTATED RINGERS IV BOLUS: 1000.0000 mL | Freq: Once | INTRAVENOUS | Status: DC

## 2022-09-25 MED ORDER — GLYCOPYRROLATE PF 0.2 MG/ML IJ SOSY
PREFILLED_SYRINGE | INTRAMUSCULAR | Status: AC
  Filled 2022-09-25: qty 1

## 2022-09-25 MED ORDER — FENTANYL CITRATE (PF) 100 MCG/2ML IJ SOLN
INTRAMUSCULAR | Status: AC
  Filled 2022-09-25: qty 2

## 2022-09-25 MED ORDER — LACTATED RINGERS IV SOLN: INTRAVENOUS | Status: DC

## 2022-09-25 MED ORDER — CEFAZOLIN SODIUM-DEXTROSE 2-4 GM/100ML-% IV SOLN
2.0000 g | INTRAVENOUS | Status: AC
  Administered 2022-09-25: 2 g via INTRAVENOUS
  Filled 2022-09-25: qty 100

## 2022-09-25 MED ORDER — PHENYLEPHRINE HCL (PRESSORS) 10 MG/ML IV SOLN
INTRAVENOUS | Status: DC | PRN
  Administered 2022-09-25 (×2): 160 ug via INTRAVENOUS
  Administered 2022-09-25: 120 ug via INTRAVENOUS

## 2022-09-25 MED ORDER — BUPIVACAINE HCL (PF) 0.5 % IJ SOLN
INTRAMUSCULAR | Status: AC
  Filled 2022-09-25: qty 30

## 2022-09-25 MED ORDER — BUPIVACAINE HCL (PF) 0.5 % IJ SOLN
INTRAMUSCULAR | Status: DC | PRN
  Administered 2022-09-25: 30 mL

## 2022-09-25 MED ORDER — MIDAZOLAM HCL 5 MG/5ML IJ SOLN
INTRAMUSCULAR | Status: DC | PRN
  Administered 2022-09-25: .5 mg via INTRAVENOUS

## 2022-09-25 MED ORDER — ROCURONIUM BROMIDE 10 MG/ML (PF) SYRINGE
PREFILLED_SYRINGE | INTRAVENOUS | Status: AC
  Filled 2022-09-25: qty 10

## 2022-09-25 MED ORDER — PROPOFOL 10 MG/ML IV BOLUS
INTRAVENOUS | Status: AC
  Filled 2022-09-25: qty 20

## 2022-09-25 MED ORDER — LIDOCAINE HCL (CARDIAC) PF 50 MG/5ML IV SOSY
PREFILLED_SYRINGE | INTRAVENOUS | Status: DC | PRN
  Administered 2022-09-25: 80 mg via INTRAVENOUS

## 2022-09-25 MED ORDER — DOCUSATE SODIUM 100 MG PO CAPS: 100.0000 mg | ORAL_CAPSULE | Freq: Two times a day (BID) | ORAL | 2 refills | Status: AC

## 2022-09-25 MED ORDER — SUGAMMADEX SODIUM 200 MG/2ML IV SOLN
INTRAVENOUS | Status: DC | PRN
  Administered 2022-09-25: 200 mg via INTRAVENOUS

## 2022-09-25 MED ORDER — PHENYLEPHRINE HCL-NACL 20-0.9 MG/250ML-% IV SOLN
INTRAVENOUS | Status: AC
  Filled 2022-09-25: qty 250

## 2022-09-25 MED ORDER — ONDANSETRON HCL 4 MG/2ML IJ SOLN: 4.0000 mg | Freq: Once | INTRAMUSCULAR | Status: DC | PRN

## 2022-09-25 MED ORDER — ROCURONIUM 10MG/ML (10ML) SYRINGE FOR MEDFUSION PUMP - OPTIME
INTRAVENOUS | Status: DC | PRN
  Administered 2022-09-25: 20 mg via INTRAVENOUS
  Administered 2022-09-25: 50 mg via INTRAVENOUS

## 2022-09-25 MED ORDER — EPHEDRINE SULFATE (PRESSORS) 50 MG/ML IJ SOLN
INTRAMUSCULAR | Status: DC | PRN
  Administered 2022-09-25: 7.5 mg via INTRAVENOUS

## 2022-09-25 MED ORDER — GLYCOPYRROLATE 0.2 MG/ML IJ SOLN
INTRAMUSCULAR | Status: DC | PRN
  Administered 2022-09-25: .2 mg via INTRAVENOUS

## 2022-09-25 MED ORDER — ONDANSETRON HCL 4 MG/2ML IJ SOLN
INTRAMUSCULAR | Status: AC
  Filled 2022-09-25: qty 4

## 2022-09-25 MED ORDER — OXYCODONE HCL 5 MG PO TABS: 5.0000 mg | ORAL_TABLET | Freq: Four times a day (QID) | ORAL | 0 refills | Status: DC | PRN

## 2022-09-25 MED ORDER — CHLORHEXIDINE GLUCONATE 0.12 % MT SOLN
15.0000 mL | Freq: Once | OROMUCOSAL | Status: AC
  Administered 2022-09-25: 15 mL via OROMUCOSAL

## 2022-09-25 MED ORDER — LIDOCAINE HCL (PF) 2 % IJ SOLN
INTRAMUSCULAR | Status: AC
  Filled 2022-09-25: qty 5

## 2022-09-25 MED ORDER — ACETAMINOPHEN 500 MG PO TABS: 1000.0000 mg | ORAL_TABLET | Freq: Four times a day (QID) | ORAL | 0 refills | Status: AC

## 2022-09-25 MED ORDER — FENTANYL CITRATE (PF) 100 MCG/2ML IJ SOLN
INTRAMUSCULAR | Status: DC | PRN
  Administered 2022-09-25: 50 ug via INTRAVENOUS
  Administered 2022-09-25 (×2): 25 ug via INTRAVENOUS
  Administered 2022-09-25: 100 ug via INTRAVENOUS

## 2022-09-25 MED ORDER — HYDROMORPHONE HCL 1 MG/ML IJ SOLN: 0.2500 mg | INTRAMUSCULAR | Status: DC | PRN

## 2022-09-25 MED ORDER — MIDAZOLAM HCL 2 MG/2ML IJ SOLN
INTRAMUSCULAR | Status: AC
  Filled 2022-09-25: qty 2

## 2022-09-25 MED ORDER — STERILE WATER FOR IRRIGATION IR SOLN
Status: DC | PRN
  Administered 2022-09-25: 500 mL

## 2022-09-25 SURGICAL SUPPLY — 55 items
ADH SKN CLS APL DERMABOND .7 (GAUZE/BANDAGES/DRESSINGS) ×1
APL PRP STRL LF DISP 70% ISPRP (MISCELLANEOUS) ×1
BLADE SURG 15 STRL LF DISP TIS (BLADE) ×1 IMPLANT
BLADE SURG 15 STRL SS (BLADE) ×1
CHLORAPREP W/TINT 26 (MISCELLANEOUS) ×1 IMPLANT
COVER LIGHT HANDLE STERIS (MISCELLANEOUS) ×2 IMPLANT
COVER MAYO STAND XLG (MISCELLANEOUS) ×1 IMPLANT
COVER TIP SHEARS 8 DVNC (MISCELLANEOUS) ×1 IMPLANT
DEFOGGER SCOPE WARMER CLEARIFY (MISCELLANEOUS) IMPLANT
DERMABOND ADVANCED .7 DNX12 (GAUZE/BANDAGES/DRESSINGS) ×1 IMPLANT
DRAPE ARM DVNC X/XI (DISPOSABLE) ×3 IMPLANT
DRAPE COLUMN DVNC XI (DISPOSABLE) ×1 IMPLANT
DRIVER NDL MEGA SUTCUT DVNCXI (INSTRUMENTS) ×1 IMPLANT
DRIVER NDLE MEGA SUTCUT DVNCXI (INSTRUMENTS) ×1 IMPLANT
ELECT REM PT RETURN 9FT ADLT (ELECTROSURGICAL) ×1
ELECTRODE REM PT RTRN 9FT ADLT (ELECTROSURGICAL) ×1 IMPLANT
FORCEPS BPLR R/ABLATION 8 DVNC (INSTRUMENTS) ×1 IMPLANT
GAUZE SPONGE 4X4 12PLY STRL (GAUZE/BANDAGES/DRESSINGS) ×1 IMPLANT
GLOVE BIO SURGEON STRL SZ7 (GLOVE) IMPLANT
GLOVE BIOGEL PI IND STRL 6.5 (GLOVE) ×2 IMPLANT
GLOVE BIOGEL PI IND STRL 7.0 (GLOVE) ×4 IMPLANT
GLOVE BIOGEL PI IND STRL 7.5 (GLOVE) IMPLANT
GLOVE SURG SS PI 6.5 STRL IVOR (GLOVE) ×2 IMPLANT
GOWN STRL REUS W/TWL LRG LVL3 (GOWN DISPOSABLE) ×3 IMPLANT
GRASPER SUT TROCAR 14GX15 (MISCELLANEOUS) IMPLANT
KIT PINK PAD W/HEAD ARE REST (MISCELLANEOUS) ×1
KIT PINK PAD W/HEAD ARM REST (MISCELLANEOUS) ×1 IMPLANT
KIT TURNOVER KIT A (KITS) ×1 IMPLANT
MANIFOLD NEPTUNE II (INSTRUMENTS) ×1 IMPLANT
MESH PROGRIP LAP SELF FIXATING (Mesh General) ×1 IMPLANT
MESH PROGRIP LAP SLF FIX 16X12 (Mesh General) ×1 IMPLANT
NDL HYPO 21X1 ECLIPSE (NEEDLE) ×1 IMPLANT
NDL INSUFFLATION 14GA 120MM (NEEDLE) ×1 IMPLANT
NEEDLE HYPO 21X1 ECLIPSE (NEEDLE) ×1 IMPLANT
NEEDLE INSUFFLATION 14GA 120MM (NEEDLE) ×1 IMPLANT
OBTURATOR OPTICAL STND 8 DVNC (TROCAR) ×1
OBTURATOR OPTICALSTD 8 DVNC (TROCAR) ×1 IMPLANT
PACK LAP CHOLE LZT030E (CUSTOM PROCEDURE TRAY) ×1 IMPLANT
PENCIL HANDSWITCHING (ELECTRODE) ×1 IMPLANT
SCISSORS MNPLR CVD DVNC XI (INSTRUMENTS) ×1 IMPLANT
SEAL CANN UNIV 5-8 DVNC XI (MISCELLANEOUS) ×3 IMPLANT
SET BASIN LINEN APH (SET/KITS/TRAYS/PACK) ×1 IMPLANT
SET TUBE SMOKE EVAC HIGH FLOW (TUBING) ×1 IMPLANT
SOL PREP POV-IOD 4OZ 10% (MISCELLANEOUS) ×1 IMPLANT
SUT MNCRL AB 4-0 PS2 18 (SUTURE) ×1 IMPLANT
SUT V-LOC 90 ABS 3-0 VLT  V-20 (SUTURE) ×1
SUT V-LOC 90 ABS 3-0 VLT V-20 (SUTURE) IMPLANT
SUT VIC AB 2-0 SH 27 (SUTURE) ×1
SUT VIC AB 2-0 SH 27X BRD (SUTURE) IMPLANT
SYR 30ML LL (SYRINGE) ×1 IMPLANT
TAPE TRANSPORE STRL 2 31045 (GAUZE/BANDAGES/DRESSINGS) ×1 IMPLANT
TRAY FOL W/BAG SLVR 16FR STRL (SET/KITS/TRAYS/PACK) ×1 IMPLANT
TRAY FOLEY W/BAG SLVR 16FR LF (SET/KITS/TRAYS/PACK) ×1
WARMER LAPAROSCOPE (MISCELLANEOUS) IMPLANT
WATER STERILE IRR 500ML POUR (IV SOLUTION) ×1 IMPLANT

## 2022-09-25 NOTE — Op Note (Signed)
[facility] Operative Note  09/25/22  Preoperative Diagnosis: Right inguinal Hernia   Postoperative Diagnosis: Same   Procedure(s) Performed: Robotic assisted laparoscopic right inguinal hernia repair with mesh   Surgeon: Theophilus Kinds, DO   Assistants: No qualified resident was available    Anesthesia: General endotracheal   Anesthesiologist: Molli Barrows, MD    Specimens: None   Estimated Blood Loss: Minimal   Blood Replacement: None    Complications: None   Wound Class:Clean   Operative Indications: The patient has a right inguinal hernia that is symptomatic and they want repaired. We discussed robotic assisted laparoscopic inguinal hernia repair and risk of bleeding, infection, issues with chronic pain post operatively, use of mesh, risk of recurrence, chance of needing to repair a bilateral hernia, risk of injury to bowel or bladder, and risk of injury to cord structures for male patients.   Findings: Vas Deferens and cord structures identified and preserved ProGrip inguinal hernia mesh used Hemostasis achieved   Procedure: The patient was taken to the operating room and placed supine. General endotracheal anesthesia was induced. Intravenous antibiotics were administered per protocol.  A foley catheter was placed and a orogastric tube positioned to decompress the stomach. The abdomen was prepared and draped in the usual sterile fashion. A time-out was completed verifying correct patient, procedure, site, positioning, and implant(s) and/or special equipment prior to beginning this procedure.  An incision was marked 20 cm above the pubic tubercle, slightly above the umbilicus. Veress needle inserted at the supraumbilical incision site.  Saline drop test noted to be positive with gradual increase in pressure after initiation of gas insufflation.  15 mm of pressure was achieved prior to removing the Veress needle and then placing a 8 mm port via  the Optiview technique through the supraumbilical site that had been previously marked.  Inspection of the area afterwards noted no injury to the surrounding organs during insertion of the needle and the port.  2 port sites were marked 8 cm to the lateral sides of the initial port, and a 8 mm robotic port was placed on the left side, another 8 mm robotic port on the right side under direct supervision. The [employer] platform was then brought into the operative field and docked to the ports.  Examination of the abdominal cavity noted a right direct inguinal hernia.  A peritoneal flap was created approximately 8 cm cephalad to the defect by using scissors with electrocautery.  Dissection was carried down towards the pubic tubercle, developing the myopectineal orifice view. Laterally the flap was carried towards the ASIS.  A direct hernia sac was noted, which carefully dissected away from the adjacent tissues to be fully reduced out of hernia cavity.  Any bleeding was controlled with combination of electrocautery and manual pressure.   After confirming adequate dissection and the peritoneal reflection completely down and away from the cord structures, a Progrip mesh was placed within the anterior abdominal wall.  After noting proper placement of the mesh with the peritoneal reflection deep to it, the previously created peritoneal flap was secured back up to the anterior abdominal wall using running 3-0 V-Lock. All needles were then removed out of the abdominal cavity, Xi platform undocked from the ports and removed off of operative field.  Exparel mixed with marcaine was infused as ilioinguinal block and at the port sites.   The abdomen was then desufflated and ports removed. All skin incisions were closed with a subcuticular stitch of Monocryl 4-0.  Dermabond was applied. The testis was gently pulled down into its anatomic position in the scrotum.  Final inspection revealed acceptable hemostasis. All counts  were correct at the end of the case. The patient was awakened from anesthesia and extubated without complication.  The patient went to the PACU in stable condition.   Theophilus Kinds, DO [facility] [address] [place], [place] [address] [phone] (office)

## 2022-09-25 NOTE — Interval H&P Note (Signed)
History and Physical Interval Note:  09/25/2022 7:24 AM  Adam [name]  has presented today for surgery, with the diagnosis of INGUINAL HERNIA.  The various methods of treatment have been discussed with the patient and family. After consideration of risks, benefits and other options for treatment, the patient has consented to  Procedure(s): XI ROBOTIC ASSISTED INGUINAL HERNIA REPAIR WITH MESH (Right) as a surgical intervention.  The patient's history has been reviewed, patient examined, no change in status, stable for surgery.  I have reviewed the patient's chart and labs.  Questions were answered to the patient's satisfaction.     Webber Michiels A Zayli Villafuerte

## 2022-09-25 NOTE — Transfer of Care (Signed)
Immediate Anesthesia Transfer of Care Note  Patient: Adam [name]  Procedure(s) Performed: XI ROBOTIC ASSISTED INGUINAL HERNIA REPAIR WITH MESH (Right: Abdomen)  Patient Location: PACU  Anesthesia Type:General  Level of Consciousness: sedated  Airway & Oxygen Therapy: Patient Spontanous Breathing  Post-op Assessment: Report given to RN  Post vital signs: Reviewed and stable  Last Vitals:  Vitals Value Taken Time  BP 74/49 09/25/22 1004  Temp    Pulse 55 09/25/22 1005  Resp 16 09/25/22 1005  SpO2 94 % 09/25/22 1005  Vitals shown include unvalidated device data.  Last Pain:  Vitals:   09/25/22 0649  TempSrc: Oral  PainSc: 0-No pain      Patients Stated Pain Goal: 6 (09/25/22 4098)  Complications: No notable events documented.

## 2022-09-25 NOTE — Anesthesia Procedure Notes (Signed)
Procedure Name: Intubation Date/Time: 09/25/2022 7:42 AM  Performed by: Moshe Salisbury, CRNAPre-anesthesia Checklist: Patient identified, Patient being monitored, Timeout performed, Emergency Drugs available and Suction available Patient Re-evaluated:Patient Re-evaluated prior to induction Oxygen Delivery Method: Circle system utilized Preoxygenation: Pre-oxygenation with 100% oxygen Induction Type: IV induction Ventilation: Mask ventilation without difficulty Laryngoscope Size: Mac and 3 Grade View: Grade I Tube type: Oral Tube size: 7.5 mm Number of attempts: 1 Airway Equipment and Method: Stylet Placement Confirmation: ETT inserted through vocal cords under direct vision, positive ETCO2 and breath sounds checked- equal and bilateral Secured at: 22 cm Tube secured with: Tape Dental Injury: Teeth and Oropharynx as per pre-operative assessment

## 2022-09-25 NOTE — Discharge Instructions (Signed)
Ambulatory Surgery Discharge Instructions  General Anesthesia or Sedation Do not drive or operate heavy machinery for 24 hours.  Do not consume alcohol, tranquilizers, sleeping medications, or any non-prescribed medications for 24 hours. Do not make important decisions or sign any important papers in the next 24 hours. You should have someone with you tonight at home.  Activity  You are advised to go directly home from the hospital.  Restrict your activities and rest for a day.  Resume light activity tomorrow. No heavy lifting over 10 lbs or strenuous exercise.  Fluids and Diet Begin with clear liquids, bouillon, dry toast, soda crackers.  If not nauseated, you may go to a regular diet when you desire.  Greasy and spicy foods are not advised.  Medications  If you have not had a bowel movement in 24 hours, take 2 tablespoons over the counter Milk of mag.             You May resume your blood thinners tomorrow (Aspirin, coumadin, or other).  You are being discharged with prescriptions for Opioid/Narcotic Medications: There are some specific considerations for these medications that you should know. Opioid Meds have risks & benefits. Addiction to these meds is always a concern with prolonged use Take medication only as directed Do not drive while taking narcotic pain medication Do not crush tablets or capsules Do not use a different container than medication was dispensed in Lock the container of medication in a cool, dry place out of reach of children and pets. Opioid medication can cause addiction Do not share with anyone else (this is a felony) Do not store medications for future use. Dispose of them properly.     Disposal:  Find a [facility] household drug take back site near you.  If you can't get to a drug take back site, use the recipe below as a last resort to dispose of expired, unused or unwanted drugs. Disposal  (Do not dispose chemotherapy drugs this way, talk to your  prescribing doctor instead.) Step 1: Mix drugs (do not crush) with dirt, kitty litter, or used coffee grounds and add a small amount of water to dissolve any solid medications. Step 2: Seal drugs in plastic bag. Step 3: Place plastic bag in trash. Step 4: Take prescription container and scratch out personal information, then recycle or throw away.  Operative Site  You have a liquid bandage over your incisions, this will begin to flake off in about a week. Ok to shower tomorrow. Keep wound clean and dry. No baths or swimming. No lifting more than 10 pounds.  Contact Information: If you have questions or concerns, please call our office, [phone], Monday- Thursday 8AM-5PM and Friday 8AM-12Noon.  If it is after hours or on the weekend, please call [facility] Main Number, [phone], and ask to speak to the surgeon on call for Dr. Zorah Backes at [facility].   SPECIFIC COMPLICATIONS TO WATCH FOR: Inability to urinate Fever over 101? F by mouth Nausea and vomiting lasting longer than 24 hours. Pain not relieved by medication ordered Swelling around the operative site Increased redness, warmth, hardness, around operative area Numbness, tingling, or cold fingers or toes Blood -soaked dressing, (small amounts of oozing may be normal) Increasing and progressive drainage from surgical area or exam site  

## 2022-09-25 NOTE — Progress Notes (Signed)
[facility]  Spoke with the patient's wife and daughter in the consultation room.  I explained that he tolerated the procedure without difficulty.  He has dissolvable stitches under the skin with overlying skin glue.  This will flake off in 10 to 14 days.  I discharged him home with a prescription for narcotic pain medication that they should take as needed for pain.  I also want him taking scheduled Tylenol.  If they take the narcotic pain medication, they should take a stool softener as well.  The patient will follow-up with me in 2 weeks.  All questions were answered to his expressed satisfaction.  Theophilus Kinds, DO [facility] [address] [place], [place] [address] [phone] (office)

## 2022-09-25 NOTE — Anesthesia Postprocedure Evaluation (Signed)
Anesthesia Post Note  Patient: Adam [name]  Procedure(s) Performed: XI ROBOTIC ASSISTED INGUINAL HERNIA REPAIR WITH MESH (Right: Abdomen)  Patient location during evaluation: Phase II Anesthesia Type: General Level of consciousness: awake and alert and oriented Pain management: pain level controlled Vital Signs Assessment: post-procedure vital signs reviewed and stable Respiratory status: spontaneous breathing, nonlabored ventilation and respiratory function stable Cardiovascular status: blood pressure returned to baseline and stable Postop Assessment: no apparent nausea or vomiting Anesthetic complications: no  No notable events documented.   Last Vitals:  Vitals:   09/25/22 1100 09/25/22 1123  BP: (!) 101/56 (!) 101/56  Pulse: (!) 50 (!) 52  Resp: 11 11  Temp:  (!) 36.3 C  SpO2: 98% 92%    Last Pain:  Vitals:   09/25/22 1123  TempSrc: Oral  PainSc: 2                  Sharonna Vinje C Gretel Cantu

## 2022-09-25 NOTE — Anesthesia Preprocedure Evaluation (Signed)
Anesthesia Evaluation  Patient identified by MRN, date of birth, ID band Patient awake    Reviewed: Allergy & Precautions, H&P , NPO status , Patient's Chart, lab work & pertinent test results  Airway Mallampati: I  TM Distance: >3 FB Neck ROM: Full    Dental  (+) Edentulous Upper, Edentulous Lower   Pulmonary neg pulmonary ROS, former smoker   Pulmonary exam normal breath sounds clear to auscultation       Cardiovascular Exercise Tolerance: Good METS: 3 - Mets hypertension, Pt. on medications + Valvular Problems/Murmurs AS  Rhythm:Regular Rate:Normal + Systolic murmurs  1. Left ventricular ejection fraction, by estimation, is 70 to 75%. The  left ventricle has hyperdynamic function. The left ventricle has no  regional wall motion abnormalities. There is mild left ventricular  hypertrophy. Left ventricular diastolic  parameters were normal.   2. Right ventricular systolic function is normal. The right ventricular  size is normal. There is normal pulmonary artery systolic pressure. The  estimated right ventricular systolic pressure is 21.8 mmHg.   3. The mitral valve is grossly normal. Trivial mitral valve  regurgitation.   4. The aortic valve is tricuspid. There is moderate calcification of the  aortic valve. Aortic valve regurgitation is mild. Moderate aortic valve  stenosis. Aortic regurgitation PHT measures 630 msec. Aortic valve mean  gradient measures 23.0 mmHg.  Dimentionless index 0.64.   5. The inferior vena cava is normal in size with greater than 50%  respiratory variability, suggesting right atrial pressure of 3 mmHg     Neuro/Psych  PSYCHIATRIC DISORDERS  Depression     Neuromuscular disease    GI/Hepatic Neg liver ROS, PUD,GERD  Medicated and Controlled,,  Endo/Other  negative endocrine ROS    Renal/GU negative Renal ROS  negative genitourinary   Musculoskeletal  (+) Arthritis ,    Abdominal    Peds negative pediatric ROS (+)  Hematology negative hematology ROS (+)   Anesthesia Other Findings   Reproductive/Obstetrics negative OB ROS                             Anesthesia Physical Anesthesia Plan  ASA: 3  Anesthesia Plan: General   Post-op Pain Management: Dilaudid IV   Induction: Intravenous  PONV Risk Score and Plan: 4 or greater and Ondansetron and Dexamethasone  Airway Management Planned: Oral ETT  Additional Equipment:   Intra-op Plan:   Post-operative Plan: Extubation in OR  Informed Consent: I have reviewed the patients History and Physical, chart, labs and discussed the procedure including the risks, benefits and alternatives for the proposed anesthesia with the patient or authorized representative who has indicated his/her understanding and acceptance.       Plan Discussed with: CRNA and Surgeon  Anesthesia Plan Comments:        Anesthesia Quick Evaluation

## 2022-09-30 ENCOUNTER — Encounter (HOSPITAL_COMMUNITY): Payer: Self-pay | Admitting: Surgery

## 2022-10-01 ENCOUNTER — Telehealth: Payer: Self-pay | Admitting: *Deleted

## 2022-10-01 ENCOUNTER — Encounter: Payer: Self-pay | Admitting: Internal Medicine

## 2022-10-01 ENCOUNTER — Ambulatory Visit (INDEPENDENT_AMBULATORY_CARE_PROVIDER_SITE_OTHER): Payer: Medicare HMO | Admitting: Internal Medicine

## 2022-10-01 VITALS — BP 160/74 | HR 66 | Ht 64.0 in | Wt 168.8 lb

## 2022-10-01 DIAGNOSIS — I10 Essential (primary) hypertension: Secondary | ICD-10-CM | POA: Diagnosis not present

## 2022-10-01 DIAGNOSIS — Z8719 Personal history of other diseases of the digestive system: Secondary | ICD-10-CM | POA: Diagnosis not present

## 2022-10-01 DIAGNOSIS — I350 Nonrheumatic aortic (valve) stenosis: Secondary | ICD-10-CM

## 2022-10-01 DIAGNOSIS — D229 Melanocytic nevi, unspecified: Secondary | ICD-10-CM

## 2022-10-01 DIAGNOSIS — Z9889 Other specified postprocedural states: Secondary | ICD-10-CM | POA: Diagnosis not present

## 2022-10-01 MED ORDER — HYDRALAZINE HCL 25 MG PO TABS
25.0000 mg | ORAL_TABLET | Freq: Two times a day (BID) | ORAL | 0 refills | Status: DC
Start: 2022-10-01 — End: 2022-10-22

## 2022-10-01 NOTE — Progress Notes (Signed)
Acute Office Visit  Subjective:    Patient ID: Adam [name], male    DOB: Jan 20, 1951, 72 y.o.   MRN: [id number]  Chief Complaint  Patient presents with   Cyst    Patient has a knot on his head that is painful, and sore to the touch     HPI Patient is in today for complaint of a skin lesion on his head, which is chronic, but has been slightly sore for the last 1 week.  He denies any recent change in the size of the skin lesion.  He has had laparoscopic hernia repair and is doing well now.  Denies any bleeding or discharge from the incision sites.  Denies any fever or chills.  HTN: His BP has been elevated recently.  He was placed on valsartan-HCTZ 320-25 mg QD recently.  He is also taking verapamil 180 mg twice daily currently.  Denies any chest pain, dyspnea or palpitations currently.  Past Medical History:  Diagnosis Date   Allergy    BMI 31.0-31.9,adult JUNE 2011 180 LBS    Chronic back pain    Depression    Eosinophilic granuloma (HCC) JAN 2011 GASTRIC, followed by Dr. [name]   EGD JAN 2011, JUN 2011, NOV 2011   Gastric ulcer 08/31/2012   GERD (gastroesophageal reflux disease)    HTN (hypertension)    Hyperlipemia    IBS (irritable bowel syndrome)    Prostate hypertrophy     Past Surgical History:  Procedure Laterality Date   BACK SURGERY     COLONOSCOPY  JAN 2011 COMPLETE   hypoTN and bradycardia w/ Demerol ,Phenergan and Versed, [place] TICS, SML IH   COLONOSCOPY  2003   NUR-normal   COLONOSCOPY  06/21/09   Fields-sigmoid diverticulosis, sm int hemorrhoids   COLONOSCOPY N/A 10/24/2013   Normal mucosa in the terminal ileum Moderate diverticulosis noted in the sigmoid colonModerate sized internal hemorrhoids. negative random colon bx.. next TCS 10/2023   Cyst Removed     from Left leg and knee   ESOPHAGOGASTRODUODENOSCOPY  05/20/2012   SLF: MILD ESOPHAGITIS.NO BARRETT'S/Multiple ulcers ranging between 3-5 mm/MILD Duodenal inflammation was found in the duodenal  bulb   ESOPHAGOGASTRODUODENOSCOPY N/A 09/05/2012   SLF: MILD Non-erosive gastritis (inflammation) in the gastric antrum. Biopsies benign, no H. pylori.   ESOPHAGOGASTRODUODENOSCOPY N/A 10/24/2013   WUJ:[id number] DISTAL ESOPHAGEAL WEBSmall hiatal herniaMILD Non-erosive gastritis. SB bx benign. gastric bx, minimal inflammation.    GANGLION CYST EXCISION     from L wrist and pins placed for Fx involving L thumb   HAND SURGERY  at the age of 3   Brother accidentally cut it   MALONEY DILATION N/A 10/24/2013   Procedure: Elease Hashimoto DILATION;  Surgeon: [name], MD;  Location: [facility];  Service: Endoscopy;  Laterality: N/A;   MASS EXCISION  04/29/2012   Procedure: EXCISION MASS;  Surgeon: [name], MD;  Location: [facility];  Service: General;  Laterality: Right;  Excision of Vascular Mass Right Arm   SAVORY DILATION N/A 10/24/2013   Procedure: SAVORY DILATION;  Surgeon: [name], MD;  Location: [facility];  Service: Endoscopy;  Laterality: N/A;   SHOULDER SURGERY Left 2017   TUMOR REMOVAL  1994   [facility]   UPPER GASTROINTESTINAL ENDOSCOPY  JAN 2011   GASTRIC ULCER-EOS GRANULOMA   UPPER GASTROINTESTINAL ENDOSCOPY  JUN 2011   GASTRIC ULCER- EOS, NO H. PYLORI   UPPER GASTROINTESTINAL ENDOSCOPY  NOV  2011   GASTRIC NODULE, no ulcer- EOS   XI ROBOTIC ASSISTED INGUINAL HERNIA REPAIR WITH MESH Right 09/25/2022   Procedure: XI ROBOTIC ASSISTED INGUINAL HERNIA REPAIR WITH MESH;  Surgeon: [name], DO;  Location: [facility];  Service: General;  Laterality: Right;    Family History  Problem Relation Age of Onset   Hypertension Mother    Diabetes Mother    Hyperlipidemia Mother    Thyroid disease Mother    Hypertension Father    Diabetes Father    Stroke Father    Colon cancer Maternal Grandmother        OVER 58    Social History   Socioeconomic History   Marital status: Married    Spouse name: [name]   Number of children: 1   Years  of education: 12   Highest education level: Not on file  Occupational History   Occupation: disabled  Tobacco Use   Smoking status: Former    Packs/day: 0.25    Years: 15.00    Additional pack years: 0.00    Total pack years: 3.75    Types: Cigars, Cigarettes    Quit date: 04/29/2002    Years since quitting: 20.4   Smokeless tobacco: Never  Vaping Use   Vaping Use: Never used  Substance and Sexual Activity   Alcohol use: Not Currently   Drug use: No   Sexual activity: Yes    Birth control/protection: None  Other Topics Concern   Not on file  Social History Narrative   Lives w/ wife   Caffeine use: 1 cup coffee every morning   Social Determinants of Health   Financial Resource Strain: Low Risk  (05/19/2022)   Overall Financial Resource Strain (CARDIA)    Difficulty of Paying Living Expenses: Not hard at all  Food Insecurity: No Food Insecurity (05/19/2022)   Hunger Vital Sign    Worried About Running Out of Food in the Last Year: Never true    Ran Out of Food in the Last Year: Never true  Transportation Needs: No Transportation Needs (05/19/2022)   PRAPARE - Administrator, Civil Service (Medical): No    Lack of Transportation (Non-Medical): No  Physical Activity: Sufficiently Active (05/19/2022)   Exercise Vital Sign    Days of Exercise per Week: 5 days    Minutes of Exercise per Session: 30 min  Stress: No Stress Concern Present (05/19/2022)   [employer] of Occupational Health - Occupational Stress Questionnaire    Feeling of Stress : Not at all  Social Connections: Moderately Integrated (05/19/2022)   Social Connection and Isolation Panel [NHANES]    Frequency of Communication with Friends and Family: More than three times a week    Frequency of Social Gatherings with Friends and Family: Three times a week    Attends Religious Services: More than 4 times per year    Active Member of Clubs or Organizations: No    Attends Banker  Meetings: Never    Marital Status: Married  Catering manager Violence: Not At Risk (05/19/2022)   Humiliation, Afraid, Rape, and Kick questionnaire    Fear of Current or Ex-Partner: No    Emotionally Abused: No    Physically Abused: No    Sexually Abused: No    Outpatient Medications Prior to Visit  Medication Sig Dispense Refill   acetaminophen (TYLENOL) 500 MG tablet Take 2 tablets (1,000 mg total) by mouth every 6 (six) hours for 7 days. 56 tablet  0   docusate sodium (COLACE) 100 MG capsule Take 1 capsule (100 mg total) by mouth 2 (two) times daily. 60 capsule 2   doxazosin (CARDURA) 8 MG tablet TAKE 1 TABLET AT BEDTIME 90 tablet 3   loratadine (CLARITIN) 10 MG tablet Take 10 mg by mouth daily as needed for allergies.      oxyCODONE (ROXICODONE) 5 MG immediate release tablet Take 1 tablet (5 mg total) by mouth every 6 (six) hours as needed. 16 tablet 0   pantoprazole (PROTONIX) 40 MG tablet TAKE 1 TABLET TWICE DAILY 180 tablet 3   PARoxetine (PAXIL) 20 MG tablet TAKE 1 TABLET EVERY DAY 90 tablet 3   pravastatin (PRAVACHOL) 20 MG tablet TAKE 1 TABLET AT BEDTIME (NEED MD APPOINTMENT) 90 tablet 3   valsartan-hydrochlorothiazide (DIOVAN-HCT) 320-25 MG tablet Take 1 tablet by mouth daily. 90 tablet 3   verapamil (CALAN-SR) 180 MG CR tablet TAKE 1 TABLET TWICE DAILY 180 tablet 3   No facility-administered medications prior to visit.    Allergies  Allergen Reactions   Sulfonamide Derivatives     REACTION: causes hives    Review of Systems  Constitutional:  Negative for chills and fever.  HENT:  Negative for congestion and sore throat.   Eyes:  Negative for pain and discharge.  Respiratory:  Negative for cough and shortness of breath.   Cardiovascular:  Negative for chest pain and palpitations.  Gastrointestinal:  Negative for constipation, diarrhea, nausea and vomiting.  Endocrine: Negative for polydipsia and polyuria.  Genitourinary:  Negative for dysuria and hematuria.   Musculoskeletal:  Positive for arthralgias. Negative for neck pain and neck stiffness.  Skin:  Positive for rash.  Neurological:  Negative for dizziness, weakness, numbness and headaches.  Psychiatric/Behavioral:  Negative for agitation and behavioral problems.        Objective:    Physical Exam Vitals reviewed.  Constitutional:      General: He is not in acute distress.    Appearance: He is not diaphoretic.  HENT:     Head: Normocephalic and atraumatic.     Nose: Nose normal.     Mouth/Throat:     Mouth: Mucous membranes are moist.  Eyes:     General: No scleral icterus.    Extraocular Movements: Extraocular movements intact.  Cardiovascular:     Rate and Rhythm: Normal rate and regular rhythm.     Pulses: Normal pulses.     Heart sounds: Murmur heard.  Pulmonary:     Breath sounds: Normal breath sounds. No wheezing or rales.  Abdominal:     General: Bowel sounds are normal.     Palpations: Abdomen is soft.     Comments: S/p inguinal hernia repair Port sites C/D/I  Musculoskeletal:     Cervical back: Neck supple. No tenderness.     Right lower leg: No edema.     Left lower leg: No edema.  Skin:    General: Skin is warm.     Comments: Atypical mole (brownish) over scalp, about 2 cm in diameter  Neurological:     General: No focal deficit present.     Mental Status: He is alert and oriented to person, place, and time.  Psychiatric:        Mood and Affect: Mood normal.        Behavior: Behavior normal.     BP (!) 160/74 (BP Location: Left Arm)   Pulse 66   Ht  (1.626 m)   Wt 168 lb  12.8 oz (76.6 kg)   SpO2 96%   BMI 28.97 kg/m  Wt Readings from Last 3 Encounters:  10/01/22 168 lb 12.8 oz (76.6 kg)  09/23/22 165 lb (74.8 kg)  09/15/22 165 lb (74.8 kg)        Assessment & Plan:   Problem List Items Addressed This Visit       Cardiovascular and Mediastinum   Essential hypertension, benign    BP Readings from Last 1 Encounters:  10/01/22 (!)  160/74  Uncontrolled with Verapamil 180 mg BID and Valsartan-HCTZ 320-25 mg QD and Doxazosin 8 mg QD Added Hydralazine 25 mg BID Counseled for compliance with the medications Advised DASH diet and moderate exercise/walking, at least 150 mins/week      Relevant Medications   hydrALAZINE (APRESOLINE) 25 MG tablet   Nonrheumatic aortic valve stenosis    Echo reviewed, asymptomatic currently Will refer to cardiology if he has dyspnea, palpitations, LE swelling or dizziness      Relevant Medications   hydrALAZINE (APRESOLINE) 25 MG tablet     Other   Atypical mole - Primary    Atypical mole over scalp area, since it is sore, referred to Dermatology      Relevant Orders   Ambulatory referral to Dermatology   S/P inguinal hernia repair    On 09/25/22. Port sites C/D/I Denies any local bleeding or discharge        Meds ordered this encounter  Medications   hydrALAZINE (APRESOLINE) 25 MG tablet    Sig: Take 1 tablet (25 mg total) by mouth 2 (two) times daily.    Dispense:  60 tablet    Refill:  0     Octavion Mollenkopf Concha Se, MD

## 2022-10-01 NOTE — Assessment & Plan Note (Addendum)
On 09/25/22. Port sites C/D/I Denies any local bleeding or discharge

## 2022-10-01 NOTE — Telephone Encounter (Signed)
Surgical Date: 09/25/2022 Procedure: XI ROBOTIC ASSISTED INGUINAL HERNIA REPAIR WITH MESH, RIGHT  Received call from patient [phone]~ telephone.   Patient inquired if he could mow his lawn with his riding lawnmower. Advised that if he feels comfortable driving the lawnmower, he may use it to mow his grass. Advised that he should still avoid pushing/ pulling/ lifting >15lbs.

## 2022-10-01 NOTE — Assessment & Plan Note (Signed)
Atypical mole over scalp area, since it is sore, referred to Dermatology

## 2022-10-01 NOTE — Assessment & Plan Note (Signed)
BP Readings from Last 1 Encounters:  10/01/22 (!) 160/74   Uncontrolled with Verapamil 180 mg BID and Valsartan-HCTZ 320-25 mg QD and Doxazosin 8 mg QD Added Hydralazine 25 mg BID Counseled for compliance with the medications Advised DASH diet and moderate exercise/walking, at least 150 mins/week

## 2022-10-01 NOTE — Patient Instructions (Signed)
Please start taking Hydralazine twice daily.  Please continue taking other medications as prescribed.  Please follow DASH diet and perform moderate exercise/walking at least 150 mins/week.

## 2022-10-01 NOTE — Assessment & Plan Note (Signed)
Echo reviewed, asymptomatic currently Will refer to cardiology if he has dyspnea, palpitations, LE swelling or dizziness 

## 2022-10-13 ENCOUNTER — Ambulatory Visit (INDEPENDENT_AMBULATORY_CARE_PROVIDER_SITE_OTHER): Payer: Medicare HMO | Admitting: Surgery

## 2022-10-13 ENCOUNTER — Encounter: Payer: Self-pay | Admitting: Surgery

## 2022-10-13 VITALS — BP 126/73 | HR 71 | Temp 98.2°F | Resp 12 | Ht 64.0 in | Wt 168.0 lb

## 2022-10-13 DIAGNOSIS — Z09 Encounter for follow-up examination after completed treatment for conditions other than malignant neoplasm: Secondary | ICD-10-CM

## 2022-10-13 NOTE — Progress Notes (Signed)
[facility] Note   HPI:  72 y.o. Male presents to clinic for post-op follow-up s/p robotic assisted laparoscopic right inguinal hernia repair with mesh on 4/19.  He has been doing very well since surgery.  He denies significant.  He occasionally will take a Tylenol.  He is tolerating a diet without nausea and vomiting, moving his bowels without issue.  Denies fevers or chills.  Denies issues with his incision sites.  Review of Systems:  All other review of systems: otherwise negative   Vital Signs:  BP 126/73   Pulse 71   Temp 98.2 F (36.8 C) (Oral)   Resp 12   Ht 5\' 4"  (1.626 m)   Wt 168 lb (76.2 kg)   SpO2 91%   BMI 28.84 kg/m    Physical Exam:  Physical Exam Vitals reviewed.  Constitutional:      Appearance: Normal appearance.  Abdominal:     Comments: Abdomen soft, nondistended, no percussion tenderness, nontender to palpation; no rigidity, guarding, rebound tenderness; incisions healing well on abdomen, no erythema, tenderness, or ecchymosis in his right groin  Neurological:     Mental Status: He is alert.     Laboratory studies: None   Imaging:  None   Assessment:  72 y.o. yo Male who presents for follow up s/p robotic assisted laparoscopic right inguinal hernia repair with mesh on 4/19.  Plan:  -Patient overall doing very well.  Tolerating a diet, moving his bowels, and pain controlled without pain medications -Advised that he should continue to take it easy for total of 4 weeks after surgery -When he does start increasing his activity, he should start slow and slowly increase it.  If the activity starts causing him pain, he should back off, as this is his body's way of telling him he is not ready for that activity -Follow up as needed  All of the above recommendations were discussed with the patient and patient's family, and all of patient's and family's questions were answered to their expressed satisfaction.  Theophilus Kinds,  DO [facility] [address] [place] [employer], [place] [address] [phone] (office)

## 2022-10-21 DIAGNOSIS — D044 Carcinoma in situ of skin of scalp and neck: Secondary | ICD-10-CM | POA: Diagnosis not present

## 2022-10-22 ENCOUNTER — Encounter: Payer: Self-pay | Admitting: Internal Medicine

## 2022-10-22 ENCOUNTER — Ambulatory Visit (INDEPENDENT_AMBULATORY_CARE_PROVIDER_SITE_OTHER): Payer: Medicare HMO | Admitting: Internal Medicine

## 2022-10-22 VITALS — BP 128/64 | HR 71 | Ht 64.0 in | Wt 170.0 lb

## 2022-10-22 DIAGNOSIS — R7303 Prediabetes: Secondary | ICD-10-CM | POA: Diagnosis not present

## 2022-10-22 DIAGNOSIS — E782 Mixed hyperlipidemia: Secondary | ICD-10-CM

## 2022-10-22 DIAGNOSIS — Z1159 Encounter for screening for other viral diseases: Secondary | ICD-10-CM

## 2022-10-22 DIAGNOSIS — I10 Essential (primary) hypertension: Secondary | ICD-10-CM

## 2022-10-22 DIAGNOSIS — N179 Acute kidney failure, unspecified: Secondary | ICD-10-CM | POA: Diagnosis not present

## 2022-10-22 DIAGNOSIS — D229 Melanocytic nevi, unspecified: Secondary | ICD-10-CM | POA: Diagnosis not present

## 2022-10-22 DIAGNOSIS — N400 Enlarged prostate without lower urinary tract symptoms: Secondary | ICD-10-CM | POA: Diagnosis not present

## 2022-10-22 MED ORDER — HYDRALAZINE HCL 25 MG PO TABS: 25.0000 mg | ORAL_TABLET | Freq: Two times a day (BID) | ORAL | 1 refills | Status: DC

## 2022-10-22 MED ORDER — VALSARTAN-HYDROCHLOROTHIAZIDE 320-25 MG PO TABS: 1.0000 | ORAL_TABLET | Freq: Every day | ORAL | 3 refills | Status: DC

## 2022-10-22 NOTE — Progress Notes (Signed)
Established Patient Office Visit  Subjective:  Patient ID: Adam [name], male    DOB: 01/31/1951  Age: 72 y.o. MRN: [id number]  CC:  Chief Complaint  Patient presents with   Hypertension    HPI [name] Adam is a 72 y.o. male with past medical history of HTN, GERD, eosinophilic gastritis, BPH, HLD and depression who presents for f/u of his chronic medical conditions.  HTN: BP is well-controlled. Takes medications regularly.  He was recently placed on hydralazine 25 mg twice daily.  He is also taking valsartan-HCTZ 320-25 mg QD and verapamil 180 mg BID. Patient denies headache, dizziness, chest pain, dyspnea or palpitations.  He had removal of scalp mole yesterday.  Denies any local bleeding or discharge.  Past Medical History:  Diagnosis Date   Allergy    BMI 31.0-31.9,adult JUNE 2011 180 LBS    Chronic back pain    Depression    Eosinophilic granuloma (HCC) JAN 2011 GASTRIC, followed by Dr. [name]   EGD JAN 2011, JUN 2011, NOV 2011   Gastric ulcer 08/31/2012   GERD (gastroesophageal reflux disease)    HTN (hypertension)    Hyperlipemia    IBS (irritable bowel syndrome)    Prostate hypertrophy     Past Surgical History:  Procedure Laterality Date   BACK SURGERY     COLONOSCOPY  JAN 2011 COMPLETE   hypoTN and bradycardia w/ Demerol ,Phenergan and Versed, [place] TICS, SML IH   COLONOSCOPY  2003   NUR-normal   COLONOSCOPY  06/21/09   Fields-sigmoid diverticulosis, sm int hemorrhoids   COLONOSCOPY N/A 10/24/2013   Normal mucosa in the terminal ileum Moderate diverticulosis noted in the sigmoid colonModerate sized internal hemorrhoids. negative random colon bx.. next TCS 10/2023   Cyst Removed     from Left leg and knee   ESOPHAGOGASTRODUODENOSCOPY  05/20/2012   SLF: MILD ESOPHAGITIS.NO BARRETT'S/Multiple ulcers ranging between 3-5 mm/MILD Duodenal inflammation was found in the duodenal bulb   ESOPHAGOGASTRODUODENOSCOPY N/A 09/05/2012   SLF: MILD Non-erosive  gastritis (inflammation) in the gastric antrum. Biopsies benign, no H. pylori.   ESOPHAGOGASTRODUODENOSCOPY N/A 10/24/2013   HYQ:MVHQIONG DISTAL ESOPHAGEAL WEBSmall hiatal herniaMILD Non-erosive gastritis. SB bx benign. gastric bx, minimal inflammation.    GANGLION CYST EXCISION     from L wrist and pins placed for Fx involving L thumb   HAND SURGERY  at the age of 3   Brother accidentally cut it   MALONEY DILATION N/A 10/24/2013   Procedure: Elease Hashimoto DILATION;  Surgeon: [name], MD;  Location: [facility];  Service: Endoscopy;  Laterality: N/A;   MASS EXCISION  04/29/2012   Procedure: EXCISION MASS;  Surgeon: [name], MD;  Location: [facility];  Service: General;  Laterality: Right;  Excision of Vascular Mass Right Arm   SAVORY DILATION N/A 10/24/2013   Procedure: SAVORY DILATION;  Surgeon: [name], MD;  Location: [facility];  Service: Endoscopy;  Laterality: N/A;   SHOULDER SURGERY Left 2017   TUMOR REMOVAL  1994   Benign from brain at [facility]   UPPER GASTROINTESTINAL ENDOSCOPY  JAN 2011   GASTRIC ULCER-EOS GRANULOMA   UPPER GASTROINTESTINAL ENDOSCOPY  JUN 2011   GASTRIC ULCER- EOS, NO H. PYLORI   UPPER GASTROINTESTINAL ENDOSCOPY  NOV 2011   GASTRIC NODULE, no ulcer- EOS   XI ROBOTIC ASSISTED INGUINAL HERNIA REPAIR WITH MESH Right 09/25/2022   Procedure: XI ROBOTIC ASSISTED INGUINAL HERNIA REPAIR WITH MESH;  Surgeon: [name],  DO;  Location: [facility];  Service: General;  Laterality: Right;    Family History  Problem Relation Age of Onset   Hypertension Mother    Diabetes Mother    Hyperlipidemia Mother    Thyroid disease Mother    Hypertension Father    Diabetes Father    Stroke Father    Colon cancer Maternal Grandmother        OVER 78    Social History   Socioeconomic History   Marital status: Married    Spouse name: [name]   Number of children: 1   Years of education: 12   Highest education level: Not on file  Occupational  History   Occupation: disabled  Tobacco Use   Smoking status: Former    Packs/day: 0.25    Years: 15.00    Additional pack years: 0.00    Total pack years: 3.75    Types: Cigars, Cigarettes    Quit date: 04/29/2002    Years since quitting: 20.4   Smokeless tobacco: Never  Vaping Use   Vaping Use: Never used  Substance and Sexual Activity   Alcohol use: Not Currently   Drug use: No   Sexual activity: Yes    Birth control/protection: None  Other Topics Concern   Not on file  Social History Narrative   Lives w/ wife   Caffeine use: 1 cup coffee every morning   Social Determinants of Health   Financial Resource Strain: Low Risk  (05/19/2022)   Overall Financial Resource Strain (CARDIA)    Difficulty of Paying Living Expenses: Not hard at all  Food Insecurity: No Food Insecurity (05/19/2022)   Hunger Vital Sign    Worried About Running Out of Food in the Last Year: Never true    Ran Out of Food in the Last Year: Never true  Transportation Needs: No Transportation Needs (05/19/2022)   PRAPARE - Administrator, Civil Service (Medical): No    Lack of Transportation (Non-Medical): No  Physical Activity: Sufficiently Active (05/19/2022)   Exercise Vital Sign    Days of Exercise per Week: 5 days    Minutes of Exercise per Session: 30 min  Stress: No Stress Concern Present (05/19/2022)   [employer] of Occupational Health - Occupational Stress Questionnaire    Feeling of Stress : Not at all  Social Connections: Moderately Integrated (05/19/2022)   Social Connection and Isolation Panel [NHANES]    Frequency of Communication with Friends and Family: More than three times a week    Frequency of Social Gatherings with Friends and Family: Three times a week    Attends Religious Services: More than 4 times per year    Active Member of Clubs or Organizations: No    Attends Banker Meetings: Never    Marital Status: Married  Catering manager Violence:  Not At Risk (05/19/2022)   Humiliation, Afraid, Rape, and Kick questionnaire    Fear of Current or Ex-Partner: No    Emotionally Abused: No    Physically Abused: No    Sexually Abused: No    Outpatient Medications Prior to Visit  Medication Sig Dispense Refill   docusate sodium (COLACE) 100 MG capsule Take 1 capsule (100 mg total) by mouth 2 (two) times daily. 60 capsule 2   doxazosin (CARDURA) 8 MG tablet TAKE 1 TABLET AT BEDTIME 90 tablet 3   loratadine (CLARITIN) 10 MG tablet Take 10 mg by mouth daily as needed for allergies.  pantoprazole (PROTONIX) 40 MG tablet TAKE 1 TABLET TWICE DAILY 180 tablet 3   PARoxetine (PAXIL) 20 MG tablet TAKE 1 TABLET EVERY DAY 90 tablet 3   pravastatin (PRAVACHOL) 20 MG tablet TAKE 1 TABLET AT BEDTIME (NEED MD APPOINTMENT) 90 tablet 3   verapamil (CALAN-SR) 180 MG CR tablet TAKE 1 TABLET TWICE DAILY 180 tablet 3   hydrALAZINE (APRESOLINE) 25 MG tablet Take 1 tablet (25 mg total) by mouth 2 (two) times daily. 60 tablet 0   valsartan-hydrochlorothiazide (DIOVAN-HCT) 320-25 MG tablet Take 1 tablet by mouth daily. 90 tablet 3   No facility-administered medications prior to visit.    Allergies  Allergen Reactions   Sulfonamide Derivatives     REACTION: causes hives    ROS Review of Systems  Constitutional:  Negative for chills and fever.  HENT:  Negative for congestion and sore throat.   Eyes:  Negative for pain and discharge.  Respiratory:  Negative for cough and shortness of breath.   Cardiovascular:  Negative for chest pain and palpitations.  Gastrointestinal:  Negative for constipation, diarrhea, nausea and vomiting.  Endocrine: Negative for polydipsia and polyuria.  Genitourinary:  Negative for dysuria and hematuria.  Musculoskeletal:  Positive for arthralgias. Negative for neck pain and neck stiffness.  Skin:  Positive for rash.  Neurological:  Negative for dizziness, weakness, numbness and headaches.  Psychiatric/Behavioral:  Negative  for agitation and behavioral problems.       Objective:    Physical Exam Vitals reviewed.  Constitutional:      General: He is not in acute distress.    Appearance: He is not diaphoretic.  HENT:     Head: Normocephalic and atraumatic.     Nose: Nose normal.     Mouth/Throat:     Mouth: Mucous membranes are moist.  Eyes:     General: No scleral icterus.    Extraocular Movements: Extraocular movements intact.  Cardiovascular:     Rate and Rhythm: Normal rate and regular rhythm.     Pulses: Normal pulses.     Heart sounds: Murmur heard.  Pulmonary:     Breath sounds: Normal breath sounds. No wheezing or rales.  Abdominal:     General: Bowel sounds are normal.     Palpations: Abdomen is soft.     Hernia: A hernia is present.  Musculoskeletal:     Cervical back: Neck supple. No tenderness.     Right lower leg: No edema.     Left lower leg: No edema.  Skin:    General: Skin is warm.     Findings: No rash.     Comments: S/p excision of mole over scalp  Neurological:     General: No focal deficit present.     Mental Status: He is alert and oriented to person, place, and time.  Psychiatric:        Mood and Affect: Mood normal.        Behavior: Behavior normal.     BP 128/64 (BP Location: Left Arm)   Pulse 71   Ht 5\' 4"  (1.626 m)   Wt 170 lb (77.1 kg)   SpO2 95%   BMI 29.18 kg/m  Wt Readings from Last 3 Encounters:  10/22/22 170 lb (77.1 kg)  10/13/22 168 lb (76.2 kg)  10/01/22 168 lb 12.8 oz (76.6 kg)    Lab Results  Component Value Date   TSH 1.270 03/02/2022   Lab Results  Component Value Date   WBC 8.4 08/01/2022  HGB 14.5 08/01/2022   HCT 42.3 08/01/2022   MCV 82.5 08/01/2022   PLT 302 08/01/2022   Lab Results  Component Value Date   NA 141 08/25/2022   K 4.1 08/25/2022   CO2 23 08/25/2022   GLUCOSE 91 08/25/2022   BUN 20 08/25/2022   CREATININE 1.33 (H) 08/25/2022   BILITOT 0.7 03/02/2022   ALKPHOS 51 03/02/2022   AST 17 03/02/2022   ALT  15 03/02/2022   PROT 5.9 (L) 03/02/2022   ALBUMIN 4.4 03/02/2022   CALCIUM 9.8 08/25/2022   ANIONGAP 9 08/01/2022   EGFR 56 (L) 08/25/2022   Lab Results  Component Value Date   CHOL 178 03/02/2022   Lab Results  Component Value Date   HDL 75 03/02/2022   Lab Results  Component Value Date   LDLCALC 93 03/02/2022   Lab Results  Component Value Date   TRIG 52 03/02/2022   Lab Results  Component Value Date   CHOLHDL 2.4 03/02/2022   Lab Results  Component Value Date   HGBA1C 5.4 03/02/2022      Assessment & Plan:   Problem List Items Addressed This Visit       Cardiovascular and Mediastinum   Essential hypertension, benign - Primary    BP Readings from Last 1 Encounters:  10/22/22 128/64  Well-controlled with Verapamil 180 mg BID, Valsartan-HCTZ 320-25 mg QD, Hydralazine 25 mg BID and Doxazosin 8 mg QD Counseled for compliance with the medications Advised DASH diet and moderate exercise/walking, at least 150 mins/week      Relevant Medications   hydrALAZINE (APRESOLINE) 25 MG tablet   valsartan-hydrochlorothiazide (DIOVAN-HCT) 320-25 MG tablet   Other Relevant Orders   CBC with Differential/Platelet   CMP14+EGFR   Protein / creatinine ratio, urine     Genitourinary   BPH (benign prostatic hyperplasia)    On Doxazosin PSA wnl      AKI (acute kidney injury) (HCC)    Last BMP showed decreasing GFR - 56 and 58 Could be due to recent increase in antihypertensives Check CMP and urine protein creatinine ratio Advised to maintain adequate hydration        Other   Hyperlipidemia    On Pravastatin      Relevant Medications   hydrALAZINE (APRESOLINE) 25 MG tablet   valsartan-hydrochlorothiazide (DIOVAN-HCT) 320-25 MG tablet   Other Relevant Orders   Lipid Profile   Atypical mole    Atypical mole over scalp area, since it was sore, referred to Dermatology - s/p excision      Other Visit Diagnoses     Need for hepatitis C screening test        Relevant Orders   Hepatitis C Antibody   Prediabetes       Relevant Orders   CMP14+EGFR   Hemoglobin A1c      Meds ordered this encounter  Medications   DISCONTD: hydrALAZINE (APRESOLINE) 25 MG tablet    Sig: Take 1 tablet (25 mg total) by mouth 2 (two) times daily.    Dispense:  180 tablet    Refill:  1   hydrALAZINE (APRESOLINE) 25 MG tablet    Sig: Take 1 tablet (25 mg total) by mouth 2 (two) times daily.    Dispense:  180 tablet    Refill:  1   valsartan-hydrochlorothiazide (DIOVAN-HCT) 320-25 MG tablet    Sig: Take 1 tablet by mouth daily.    Dispense:  90 tablet    Refill:  3    Follow-up:  Return in about 3 months (around 01/22/2023) for HTN and CKD.    Anabel Halon, MD

## 2022-10-22 NOTE — Assessment & Plan Note (Addendum)
BP Readings from Last 1 Encounters:  10/22/22 128/64   Well-controlled with Verapamil 180 mg BID, Valsartan-HCTZ 320-25 mg QD, Hydralazine 25 mg BID and Doxazosin 8 mg QD Counseled for compliance with the medications Advised DASH diet and moderate exercise/walking, at least 150 mins/week

## 2022-10-22 NOTE — Assessment & Plan Note (Addendum)
Last BMP showed decreasing GFR - 56 and 58 Could be due to recent increase in antihypertensives Check CMP and urine protein creatinine ratio Advised to maintain adequate hydration

## 2022-10-22 NOTE — Assessment & Plan Note (Signed)
On Pravastatin 

## 2022-10-22 NOTE — Assessment & Plan Note (Signed)
On Doxazosin PSA wnl 

## 2022-10-22 NOTE — Patient Instructions (Addendum)
Please continue to take medications as prescribed.  Please continue to follow low salt diet and perform moderate exercise/walking at least 150 mins/week. 

## 2022-10-22 NOTE — Assessment & Plan Note (Signed)
Atypical mole over scalp area, since it was sore, referred to Dermatology - s/p excision

## 2022-11-13 ENCOUNTER — Ambulatory Visit: Payer: Medicare HMO | Admitting: Internal Medicine

## 2022-11-24 DIAGNOSIS — Z08 Encounter for follow-up examination after completed treatment for malignant neoplasm: Secondary | ICD-10-CM | POA: Diagnosis not present

## 2022-11-24 DIAGNOSIS — Z85828 Personal history of other malignant neoplasm of skin: Secondary | ICD-10-CM | POA: Diagnosis not present

## 2023-01-04 DIAGNOSIS — Z1159 Encounter for screening for other viral diseases: Secondary | ICD-10-CM | POA: Diagnosis not present

## 2023-01-04 DIAGNOSIS — I10 Essential (primary) hypertension: Secondary | ICD-10-CM | POA: Diagnosis not present

## 2023-01-04 DIAGNOSIS — E782 Mixed hyperlipidemia: Secondary | ICD-10-CM | POA: Diagnosis not present

## 2023-01-04 DIAGNOSIS — R7303 Prediabetes: Secondary | ICD-10-CM | POA: Diagnosis not present

## 2023-01-12 ENCOUNTER — Encounter: Payer: Self-pay | Admitting: Internal Medicine

## 2023-01-12 ENCOUNTER — Ambulatory Visit (INDEPENDENT_AMBULATORY_CARE_PROVIDER_SITE_OTHER): Payer: Medicare HMO | Admitting: Internal Medicine

## 2023-01-12 ENCOUNTER — Telehealth: Payer: Self-pay | Admitting: Internal Medicine

## 2023-01-12 VITALS — BP 108/71 | HR 68 | Ht 64.0 in | Wt 163.6 lb

## 2023-01-12 DIAGNOSIS — R338 Other retention of urine: Secondary | ICD-10-CM | POA: Diagnosis not present

## 2023-01-12 DIAGNOSIS — R809 Proteinuria, unspecified: Secondary | ICD-10-CM

## 2023-01-12 DIAGNOSIS — F3341 Major depressive disorder, recurrent, in partial remission: Secondary | ICD-10-CM | POA: Diagnosis not present

## 2023-01-12 DIAGNOSIS — I10 Essential (primary) hypertension: Secondary | ICD-10-CM | POA: Diagnosis not present

## 2023-01-12 DIAGNOSIS — N401 Enlarged prostate with lower urinary tract symptoms: Secondary | ICD-10-CM | POA: Diagnosis not present

## 2023-01-12 DIAGNOSIS — K219 Gastro-esophageal reflux disease without esophagitis: Secondary | ICD-10-CM

## 2023-01-12 NOTE — Assessment & Plan Note (Signed)
Well-controlled On Paxil

## 2023-01-12 NOTE — Assessment & Plan Note (Signed)
Has urinary retention On Doxazosin, needs to get refill PSA wnl

## 2023-01-12 NOTE — Assessment & Plan Note (Signed)
BP Readings from Last 1 Encounters:  01/12/23 108/71   Well-controlled with Verapamil 180 mg BID, Valsartan-HCTZ 320-25 mg QD, Hydralazine 25 mg BID and Doxazosin 8 mg QD Counseled for compliance with the medications Advised DASH diet and moderate exercise/walking, at least 150 mins/week

## 2023-01-12 NOTE — Assessment & Plan Note (Signed)
S. Cr. and GFR wnl now On ARB now Needs to maintain adequate hydration

## 2023-01-12 NOTE — Telephone Encounter (Signed)
Patient wife calling say patient is needing a refill on doxazosin (CARDURA) 8 MG tablet [[id number]] says he's been out for 2 wks. Please advise [facility] on [address] Thank you

## 2023-01-12 NOTE — Progress Notes (Signed)
Established Patient Office Visit  Subjective:  Patient ID: Adam [name], male    DOB: 09-Sep-1950  Age: 72 y.o. MRN: [id number]  CC:  Chief Complaint  Patient presents with   Hypertension    Follow up   Urinary Retention    Having trouble going to the bathroom    HPI Adam [name] is a 72 y.o. male with past medical history of HTN, GERD, eosinophilic gastritis, BPH, HLD and depression who presents for f/u of his chronic medical conditions.  HTN: BP is well-controlled. Takes medications regularly.  He was recently placed on hydralazine 25 mg twice daily.  He is also taking valsartan-HCTZ 320-25 mg QD and verapamil 180 mg BID. Patient denies headache, dizziness, chest pain, dyspnea or palpitations.  BPH: He has run out of doxazosin.  He gets it through mail order pharmacy.  He reports urinary retention and postvoid dribbling currently.  Denies any dysuria or hematuria.  GERD: He takes Pantoprazole for it. He denies any dysphagia or odynophagia currently.    Past Medical History:  Diagnosis Date   Allergy    BMI 31.0-31.9,adult JUNE 2011 180 LBS    Chronic back pain    Depression    Eosinophilic granuloma (HCC) JAN 2011 GASTRIC, followed by Dr. [name]   EGD JAN 2011, JUN 2011, NOV 2011   Gastric ulcer 08/31/2012   GERD (gastroesophageal reflux disease)    HTN (hypertension)    Hyperlipemia    IBS (irritable bowel syndrome)    Prostate hypertrophy     Past Surgical History:  Procedure Laterality Date   BACK SURGERY     COLONOSCOPY  JAN 2011 COMPLETE   hypoTN and bradycardia w/ Demerol ,Phenergan and Versed, [place] TICS, SML IH   COLONOSCOPY  2003   NUR-normal   COLONOSCOPY  06/21/09   Fields-sigmoid diverticulosis, sm int hemorrhoids   COLONOSCOPY N/A 10/24/2013   Normal mucosa in the terminal ileum Moderate diverticulosis noted in the sigmoid colonModerate sized internal hemorrhoids. negative random colon bx.. next TCS 10/2023   Cyst Removed     from Left leg  and knee   ESOPHAGOGASTRODUODENOSCOPY  05/20/2012   SLF: MILD ESOPHAGITIS.NO BARRETT'S/Multiple ulcers ranging between 3-5 mm/MILD Duodenal inflammation was found in the duodenal bulb   ESOPHAGOGASTRODUODENOSCOPY N/A 09/05/2012   SLF: MILD Non-erosive gastritis (inflammation) in the gastric antrum. Biopsies benign, no H. pylori.   ESOPHAGOGASTRODUODENOSCOPY N/A 10/24/2013   MWN:UUVOZDGU DISTAL ESOPHAGEAL WEBSmall hiatal herniaMILD Non-erosive gastritis. SB bx benign. gastric bx, minimal inflammation.    GANGLION CYST EXCISION     from L wrist and pins placed for Fx involving L thumb   HAND SURGERY  at the age of 72   Brother accidentally cut it   MALONEY DILATION N/A 10/24/2013   Procedure: Elease Hashimoto DILATION;  Surgeon: [name], MD;  Location: [facility];  Service: Endoscopy;  Laterality: N/A;   MASS EXCISION  04/29/2012   Procedure: EXCISION MASS;  Surgeon: [name], MD;  Location: [facility];  Service: General;  Laterality: Right;  Excision of Vascular Mass Right Arm   SAVORY DILATION N/A 10/24/2013   Procedure: SAVORY DILATION;  Surgeon: [name], MD;  Location: [facility];  Service: Endoscopy;  Laterality: N/A;   SHOULDER SURGERY Left 2017   TUMOR REMOVAL  1994   Benign from brain at [facility]   UPPER GASTROINTESTINAL ENDOSCOPY  JAN 2011   GASTRIC ULCER-EOS GRANULOMA   UPPER GASTROINTESTINAL ENDOSCOPY  JUN 2011  GASTRIC ULCER- EOS, NO H. PYLORI   UPPER GASTROINTESTINAL ENDOSCOPY  NOV 2011   GASTRIC NODULE, no ulcer- EOS   XI ROBOTIC ASSISTED INGUINAL HERNIA REPAIR WITH MESH Right 09/25/2022   Procedure: XI ROBOTIC ASSISTED INGUINAL HERNIA REPAIR WITH MESH;  Surgeon: [name], DO;  Location: [facility];  Service: General;  Laterality: Right;    Family History  Problem Relation Age of Onset   Hypertension Mother    Diabetes Mother    Hyperlipidemia Mother    Thyroid disease Mother    Hypertension Father    Diabetes Father    Stroke Father     Colon cancer Maternal Grandmother        OVER 96    Social History   Socioeconomic History   Marital status: Married    Spouse name: [name]   Number of children: 1   Years of education: 12   Highest education level: Not on file  Occupational History   Occupation: disabled  Tobacco Use   Smoking status: Former    Current packs/day: 0.00    Average packs/day: 0.3 packs/day for 15.0 years (3.8 ttl pk-yrs)    Types: Cigars, Cigarettes    Start date: 04/30/1987    Quit date: 04/29/2002    Years since quitting: 20.7   Smokeless tobacco: Never  Vaping Use   Vaping status: Never Used  Substance and Sexual Activity   Alcohol use: Not Currently   Drug use: No   Sexual activity: Yes    Birth control/protection: None  Other Topics Concern   Not on file  Social History Narrative   Lives w/ wife   Caffeine use: 1 cup coffee every morning   Social Determinants of Health   Financial Resource Strain: Low Risk  (05/19/2022)   Overall Financial Resource Strain (CARDIA)    Difficulty of Paying Living Expenses: Not hard at all  Food Insecurity: No Food Insecurity (05/19/2022)   Hunger Vital Sign    Worried About Running Out of Food in the Last Year: Never true    Ran Out of Food in the Last Year: Never true  Transportation Needs: No Transportation Needs (05/19/2022)   PRAPARE - Administrator, Civil Service (Medical): No    Lack of Transportation (Non-Medical): No  Physical Activity: Sufficiently Active (05/19/2022)   Exercise Vital Sign    Days of Exercise per Week: 5 days    Minutes of Exercise per Session: 30 min  Stress: No Stress Concern Present (05/19/2022)   [employer] of Occupational Health - Occupational Stress Questionnaire    Feeling of Stress : Not at all  Social Connections: Moderately Integrated (05/19/2022)   Social Connection and Isolation Panel [NHANES]    Frequency of Communication with Friends and Family: More than three times a week     Frequency of Social Gatherings with Friends and Family: Three times a week    Attends Religious Services: More than 4 times per year    Active Member of Clubs or Organizations: No    Attends Banker Meetings: Never    Marital Status: Married  Catering manager Violence: Not At Risk (05/19/2022)   Humiliation, Afraid, Rape, and Kick questionnaire    Fear of Current or Ex-Partner: No    Emotionally Abused: No    Physically Abused: No    Sexually Abused: No    Outpatient Medications Prior to Visit  Medication Sig Dispense Refill   docusate sodium (COLACE) 100 MG capsule Take 1  capsule (100 mg total) by mouth 2 (two) times daily. 60 capsule 2   doxazosin (CARDURA) 8 MG tablet TAKE 1 TABLET AT BEDTIME 90 tablet 3   hydrALAZINE (APRESOLINE) 25 MG tablet Take 1 tablet (25 mg total) by mouth 2 (two) times daily. 180 tablet 1   loratadine (CLARITIN) 10 MG tablet Take 10 mg by mouth daily as needed for allergies.      pantoprazole (PROTONIX) 40 MG tablet TAKE 1 TABLET TWICE DAILY 180 tablet 3   PARoxetine (PAXIL) 20 MG tablet TAKE 1 TABLET EVERY DAY 90 tablet 3   pravastatin (PRAVACHOL) 20 MG tablet TAKE 1 TABLET AT BEDTIME (NEED MD APPOINTMENT) 90 tablet 3   valsartan-hydrochlorothiazide (DIOVAN-HCT) 320-25 MG tablet Take 1 tablet by mouth daily. 90 tablet 3   verapamil (CALAN-SR) 180 MG CR tablet TAKE 1 TABLET TWICE DAILY 180 tablet 3   No facility-administered medications prior to visit.    Allergies  Allergen Reactions   Sulfonamide Derivatives     REACTION: causes hives    ROS Review of Systems  Constitutional:  Negative for chills and fever.  HENT:  Negative for congestion and sore throat.   Eyes:  Negative for pain and discharge.  Respiratory:  Negative for cough and shortness of breath.   Cardiovascular:  Negative for chest pain and palpitations.  Gastrointestinal:  Negative for constipation, diarrhea, nausea and vomiting.  Endocrine: Negative for polydipsia and  polyuria.  Genitourinary:  Negative for dysuria and hematuria.  Musculoskeletal:  Positive for arthralgias. Negative for neck pain and neck stiffness.  Skin:  Positive for rash.  Neurological:  Negative for dizziness, weakness, numbness and headaches.  Psychiatric/Behavioral:  Negative for agitation and behavioral problems.       Objective:    Physical Exam Vitals reviewed.  Constitutional:      General: He is not in acute distress.    Appearance: He is not diaphoretic.  HENT:     Head: Normocephalic and atraumatic.     Nose: Nose normal.     Mouth/Throat:     Mouth: Mucous membranes are moist.  Eyes:     General: No scleral icterus.    Extraocular Movements: Extraocular movements intact.  Cardiovascular:     Rate and Rhythm: Normal rate and regular rhythm.     Pulses: Normal pulses.     Heart sounds: Murmur heard.  Pulmonary:     Breath sounds: Normal breath sounds. No wheezing or rales.  Musculoskeletal:     Cervical back: Neck supple. No tenderness.     Right lower leg: No edema.     Left lower leg: No edema.  Skin:    General: Skin is warm.     Findings: No rash.     Comments: S/p excision of mole over scalp  Neurological:     General: No focal deficit present.     Mental Status: He is alert and oriented to person, place, and time.  Psychiatric:        Mood and Affect: Mood normal.        Behavior: Behavior normal.     BP 108/71   Pulse 68   Ht 5\' 4"  (1.626 m)   Wt 163 lb 9.6 oz (74.2 kg)   SpO2 92%   BMI 28.08 kg/m  Wt Readings from Last 3 Encounters:  01/12/23 163 lb 9.6 oz (74.2 kg)  10/22/22 170 lb (77.1 kg)  10/13/22 168 lb (76.2 kg)    Lab Results  Component Value  Date   TSH 1.270 03/02/2022   Lab Results  Component Value Date   WBC 8.2 01/04/2023   HGB 15.1 01/04/2023   HCT 44.8 01/04/2023   MCV 85 01/04/2023   PLT 304 01/04/2023   Lab Results  Component Value Date   NA 139 01/04/2023   K 4.2 01/04/2023   CO2 25 01/04/2023    GLUCOSE 78 01/04/2023   BUN 19 01/04/2023   CREATININE 1.18 01/04/2023   BILITOT 0.8 01/04/2023   ALKPHOS 55 01/04/2023   AST 24 01/04/2023   ALT 20 01/04/2023   PROT 6.3 01/04/2023   ALBUMIN 4.2 01/04/2023   CALCIUM 9.8 01/04/2023   ANIONGAP 9 08/01/2022   EGFR 65 01/04/2023   Lab Results  Component Value Date   CHOL 181 01/04/2023   Lab Results  Component Value Date   HDL 73 01/04/2023   Lab Results  Component Value Date   LDLCALC 96 01/04/2023   Lab Results  Component Value Date   TRIG 65 01/04/2023   Lab Results  Component Value Date   CHOLHDL 2.5 01/04/2023   Lab Results  Component Value Date   HGBA1C 5.6 01/04/2023      Assessment & Plan:   Problem List Items Addressed This Visit       Cardiovascular and Mediastinum   Essential hypertension, benign    BP Readings from Last 1 Encounters:  01/12/23 108/71   Well-controlled with Verapamil 180 mg BID, Valsartan-HCTZ 320-25 mg QD, Hydralazine 25 mg BID and Doxazosin 8 mg QD Counseled for compliance with the medications Advised DASH diet and moderate exercise/walking, at least 150 mins/week        Digestive   GERD (gastroesophageal reflux disease)    Well controlled, on Pantoprazole        Genitourinary   BPH (benign prostatic hyperplasia) - Primary    Has urinary retention On Doxazosin, needs to get refill PSA wnl        Other   Depression    Well-controlled On Paxil      Proteinuria    S. Cr. and GFR wnl now On ARB now Needs to maintain adequate hydration        No orders of the defined types were placed in this encounter.   Follow-up: Return in about 4 months (around 05/14/2023) for Annual physical.    Anabel Halon, MD

## 2023-01-12 NOTE — Patient Instructions (Signed)
Please continue to take medications as prescribed.  Please continue to follow low salt diet and perform moderate exercise/walking at least 150 mins/week. 

## 2023-01-12 NOTE — Assessment & Plan Note (Signed)
Well-controlled, on Pantoprazole. 

## 2023-01-22 ENCOUNTER — Ambulatory Visit: Payer: Medicare HMO | Admitting: Internal Medicine

## 2023-04-04 NOTE — Progress Notes (Unsigned)
   Established Patient Office Visit   Subjective  Patient ID: Adam [name], male    DOB: [birth year]  Age: 72 y.o. MRN: [id number]  No chief complaint on file.   He  has a past medical history of Allergy, BMI 31.0-31.9,adult (JUNE 2011 180 LBS ), Chronic back pain, Depression, Eosinophilic granuloma (HCC) (JAN 2011 GASTRIC, followed by Dr. [name]), Gastric ulcer (08/31/2012), GERD (gastroesophageal reflux disease), HTN (hypertension), Hyperlipemia, IBS (irritable bowel syndrome), and Prostate hypertrophy.  HPI Patient presents to the clinic for    ROS    Objective:     There were no vitals taken for this visit. {Vitals History (Optional):23777}  Physical Exam   No results found for any visits on 04/05/23.  The 10-year ASCVD risk score (Arnett DK, et al., 2019) is: 23%    Assessment & Plan:  There are no diagnoses linked to this encounter.  No follow-ups on file.   Cruzita Lederer Newman Nip, FNP

## 2023-04-04 NOTE — Patient Instructions (Signed)

## 2023-04-05 ENCOUNTER — Ambulatory Visit (INDEPENDENT_AMBULATORY_CARE_PROVIDER_SITE_OTHER): Payer: Medicare HMO | Admitting: Family Medicine

## 2023-04-05 ENCOUNTER — Ambulatory Visit (HOSPITAL_COMMUNITY)
Admission: RE | Admit: 2023-04-05 | Discharge: 2023-04-05 | Disposition: A | Discharge status: Home / Self Care | Payer: Medicare HMO | Source: Ambulatory Visit | Attending: Family Medicine | Admitting: Family Medicine

## 2023-04-05 ENCOUNTER — Encounter: Payer: Self-pay | Admitting: Family Medicine

## 2023-04-05 VITALS — BP 104/66 | HR 64 | Ht 64.0 in | Wt 169.0 lb

## 2023-04-05 DIAGNOSIS — K649 Unspecified hemorrhoids: Secondary | ICD-10-CM

## 2023-04-05 DIAGNOSIS — Z23 Encounter for immunization: Secondary | ICD-10-CM | POA: Diagnosis not present

## 2023-04-05 DIAGNOSIS — M4319 Spondylolisthesis, multiple sites in spine: Secondary | ICD-10-CM | POA: Diagnosis not present

## 2023-04-05 DIAGNOSIS — M545 Low back pain, unspecified: Secondary | ICD-10-CM

## 2023-04-05 DIAGNOSIS — R197 Diarrhea, unspecified: Secondary | ICD-10-CM | POA: Diagnosis not present

## 2023-04-05 DIAGNOSIS — M47816 Spondylosis without myelopathy or radiculopathy, lumbar region: Secondary | ICD-10-CM | POA: Diagnosis not present

## 2023-04-05 DIAGNOSIS — M48061 Spinal stenosis, lumbar region without neurogenic claudication: Secondary | ICD-10-CM | POA: Diagnosis not present

## 2023-04-05 DIAGNOSIS — M51369 Other intervertebral disc degeneration, lumbar region without mention of lumbar back pain or lower extremity pain: Secondary | ICD-10-CM | POA: Diagnosis not present

## 2023-04-05 MED ORDER — LOPERAMIDE HCL 2 MG PO TABS: 2.0000 mg | ORAL_TABLET | Freq: Four times a day (QID) | ORAL | 0 refills | Status: DC | PRN

## 2023-04-05 MED ORDER — PREDNISONE 20 MG PO TABS: 20.0000 mg | ORAL_TABLET | Freq: Two times a day (BID) | ORAL | 0 refills | Status: AC

## 2023-04-05 MED ORDER — HYDROCORTISONE ACETATE 25 MG RE SUPP: 25.0000 mg | Freq: Two times a day (BID) | RECTAL | 0 refills | Status: DC

## 2023-04-05 NOTE — Assessment & Plan Note (Addendum)
Advise OTC loperamide and bismuth subsalicylate for diarrhea management GI profile stool ordered  Explained patient increase fluid intake 8 glasses of water a day to prevent dehydration. Avoid caffeine and alcohol. Add semisolid and low-fiber foods gradually as bowel movements return to normal. Follow a BRAT diet bananas, rice, applesauce, toast helping firm up stool. Follow-up in unable to keep food/fluid down x 24 hours, dizziness, fevers, worsening or persistent symptoms to present to ED or contact primary care provider. Patient verbalizes understanding regarding plan of care and all questions answered.

## 2023-04-05 NOTE — Assessment & Plan Note (Addendum)
Prednisone 20 mg twice daily x 5 days  We discussed the desired effects and potential side effects of the prescribed medication for back pain. Additionally, we reviewed non-pharmacological interventions, including the importance of rest, avoiding twisting, improper bending, and straining the lower back. I demonstrated proper body mechanics to prevent further injury and advised alternating between ice and heat therapy for relief. Stretching exercises for both the back and legs were recommended to improve flexibility and support recovery. The patient was advised to follow up if symptoms worsen or persist. The patient expressed understanding of the treatment plan, and all questions were thoroughly addressed.

## 2023-04-07 DIAGNOSIS — R197 Diarrhea, unspecified: Secondary | ICD-10-CM | POA: Diagnosis not present

## 2023-04-07 DIAGNOSIS — K589 Irritable bowel syndrome without diarrhea: Secondary | ICD-10-CM | POA: Diagnosis not present

## 2023-04-11 LAB — GI PROFILE, STOOL, PCR

## 2023-04-29 NOTE — Progress Notes (Signed)
Please inform patient,  I can place referral to physical therapy if interested     Your X-ray shows some wear and tear in the discs and joints in your lower back, which is normal with aging. There's also a mild forward shift of one bone (L4) over another (L5), which has slightly worsened since 2019. This can cause back pain or discomfort.This condition can put pressure on nearby nerves, potentially causing pain, numbness, or weakness in your lower back or legs  Treatment Plan: I can place referral to physical therapy if interested   Physical Therapy: Exercises to strengthen and support your back. Medications: Pain relievers like ibuprofen for pain and inflammation. Lifestyle Changes: Stay active with low-impact exercises and maintain a healthy weight. Heat/Ice: Use a heating pad or ice for pain relief.  Follow-Up: Let us know if pain worsens or you notice numbness, weakness, or other new symptoms.   If pain persists, corticosteroid injections in the affected area may help reduce inflammation and provide relief.

## 2023-05-15 ENCOUNTER — Other Ambulatory Visit: Payer: Self-pay | Admitting: Internal Medicine

## 2023-05-15 DIAGNOSIS — E785 Hyperlipidemia, unspecified: Secondary | ICD-10-CM

## 2023-05-15 DIAGNOSIS — K219 Gastro-esophageal reflux disease without esophagitis: Secondary | ICD-10-CM

## 2023-05-18 ENCOUNTER — Encounter: Payer: Medicare HMO | Admitting: Internal Medicine

## 2023-05-25 ENCOUNTER — Encounter: Payer: Self-pay | Admitting: Internal Medicine

## 2023-05-25 ENCOUNTER — Ambulatory Visit (INDEPENDENT_AMBULATORY_CARE_PROVIDER_SITE_OTHER): Payer: Medicare HMO | Admitting: Internal Medicine

## 2023-05-25 VITALS — BP 123/70 | HR 60 | Ht 64.0 in | Wt 167.8 lb

## 2023-05-25 DIAGNOSIS — Z08 Encounter for follow-up examination after completed treatment for malignant neoplasm: Secondary | ICD-10-CM | POA: Diagnosis not present

## 2023-05-25 DIAGNOSIS — E559 Vitamin D deficiency, unspecified: Secondary | ICD-10-CM

## 2023-05-25 DIAGNOSIS — Z1211 Encounter for screening for malignant neoplasm of colon: Secondary | ICD-10-CM | POA: Diagnosis not present

## 2023-05-25 DIAGNOSIS — E782 Mixed hyperlipidemia: Secondary | ICD-10-CM | POA: Diagnosis not present

## 2023-05-25 DIAGNOSIS — I10 Essential (primary) hypertension: Secondary | ICD-10-CM | POA: Diagnosis not present

## 2023-05-25 DIAGNOSIS — K219 Gastro-esophageal reflux disease without esophagitis: Secondary | ICD-10-CM | POA: Diagnosis not present

## 2023-05-25 DIAGNOSIS — K649 Unspecified hemorrhoids: Secondary | ICD-10-CM | POA: Diagnosis not present

## 2023-05-25 DIAGNOSIS — N401 Enlarged prostate with lower urinary tract symptoms: Secondary | ICD-10-CM | POA: Diagnosis not present

## 2023-05-25 DIAGNOSIS — Z85828 Personal history of other malignant neoplasm of skin: Secondary | ICD-10-CM | POA: Diagnosis not present

## 2023-05-25 DIAGNOSIS — L821 Other seborrheic keratosis: Secondary | ICD-10-CM | POA: Diagnosis not present

## 2023-05-25 DIAGNOSIS — Z0001 Encounter for general adult medical examination with abnormal findings: Secondary | ICD-10-CM

## 2023-05-25 DIAGNOSIS — M5136 Other intervertebral disc degeneration, lumbar region with discogenic back pain only: Secondary | ICD-10-CM

## 2023-05-25 DIAGNOSIS — K581 Irritable bowel syndrome with constipation: Secondary | ICD-10-CM | POA: Diagnosis not present

## 2023-05-25 DIAGNOSIS — R338 Other retention of urine: Secondary | ICD-10-CM

## 2023-05-25 MED ORDER — HYDROCORTISONE ACETATE 25 MG RE SUPP: 25.0000 mg | Freq: Two times a day (BID) | RECTAL | 0 refills | Status: AC | PRN

## 2023-05-25 NOTE — Assessment & Plan Note (Signed)
Refilled Anusol suppository

## 2023-05-25 NOTE — Assessment & Plan Note (Signed)
BP Readings from Last 1 Encounters:  05/25/23 123/70   Well-controlled with Verapamil 180 mg BID, Valsartan-HCTZ 320-25 mg QD, Hydralazine 25 mg BID and Doxazosin 8 mg QD Counseled for compliance with the medications Advised DASH diet and moderate exercise/walking, at least 150 mins/week

## 2023-05-25 NOTE — Assessment & Plan Note (Signed)
Has chronic low back pain Recent x-ray of lumbar spine showed DDD of lumbar spine and mild anterolisthesis at L4-L5

## 2023-05-25 NOTE — Assessment & Plan Note (Signed)
Has urinary retention - now improved On Doxazosin 8 mg QD PSA wnl

## 2023-05-25 NOTE — Assessment & Plan Note (Signed)
 Physical exam as documented. Counseling done  re healthy lifestyle involving commitment to 150 minutes exercise per week, heart healthy diet, and attaining healthy weight.The importance of adequate sleep also discussed. Immunization and cancer screening needs are specifically addressed at this visit.

## 2023-05-25 NOTE — Patient Instructions (Addendum)
Please continue to take medications as prescribed.  Please continue to follow low salt diet and perform moderate exercise/walking at least 150 mins/week.  Please get fasting blood tests done within a week.

## 2023-05-25 NOTE — Assessment & Plan Note (Signed)
Well-controlled, on Pantoprazole. 

## 2023-05-25 NOTE — Assessment & Plan Note (Signed)
Chronic constipation Advised to take Senokot-S twice daily as needed MiraLAX as needed for persistent constipation Maintain adequate hydration, can also try prune juice

## 2023-05-25 NOTE — Assessment & Plan Note (Signed)
On Pravastatin 20 mg QD

## 2023-05-25 NOTE — Progress Notes (Signed)
Established Patient Office Visit  Subjective:  Patient ID: Adam [name], male    DOB: 04-04-1951  Age: 72 y.o. MRN: [id number]  CC:  Chief Complaint  Patient presents with   Annual Exam   Constipation    Issues with bowel movements     HPI Adam [name] is a 72 y.o. male with past medical history of HTN, GERD, eosinophilic gastritis, BPH, HLD and depression who presents for annual physical.  HTN: BP is well-controlled. Takes medications regularly.  He was recently placed on hydralazine 25 mg twice daily.  He is also taking valsartan-HCTZ 320-25 mg QD and verapamil 180 mg BID. Patient denies headache, dizziness, chest pain, dyspnea or palpitations.  BPH: He has started taking doxazosin again.  He gets it through mail order pharmacy.  He reports improvement in urinary retention and postvoid dribbling currently.  Denies any dysuria or hematuria.  GERD: He takes Pantoprazole for it. He denies any dysphagia or odynophagia currently.  Chronic constipation: He reports chronic constipation, has hard stools, has to strain at times.  He has been taking Dulcolax with mild relief.  He recently had episodes of diarrhea in 10/24, and was given loperamide by NP.  He currently denies any episode of melena, hematochezia or diarrhea.  He also has history of internal hemorrhoids and has used Anusol suppository with adequate response.   Past Medical History:  Diagnosis Date   Allergy    BMI 31.0-31.9,adult JUNE 2011 180 LBS    Chronic back pain    Depression    Eosinophilic granuloma (HCC) JAN 2011 GASTRIC, followed by Dr. [name]   EGD JAN 2011, JUN 2011, NOV 2011   Gastric ulcer 08/31/2012   GERD (gastroesophageal reflux disease)    HTN (hypertension)    Hyperlipemia    IBS (irritable bowel syndrome)    Prostate hypertrophy     Past Surgical History:  Procedure Laterality Date   BACK SURGERY     COLONOSCOPY  JAN 2011 COMPLETE   hypoTN and bradycardia w/ Demerol ,Phenergan  and Versed, [place] TICS, SML IH   COLONOSCOPY  2003   NUR-normal   COLONOSCOPY  06/21/09   Fields-sigmoid diverticulosis, sm int hemorrhoids   COLONOSCOPY N/A 10/24/2013   Normal mucosa in the terminal ileum Moderate diverticulosis noted in the sigmoid colonModerate sized internal hemorrhoids. negative random colon bx.. next TCS 10/2023   Cyst Removed     from Left leg and knee   ESOPHAGOGASTRODUODENOSCOPY  05/20/2012   SLF: MILD ESOPHAGITIS.NO BARRETT'S/Multiple ulcers ranging between 3-5 mm/MILD Duodenal inflammation was found in the duodenal bulb   ESOPHAGOGASTRODUODENOSCOPY N/A 09/05/2012   SLF: MILD Non-erosive gastritis (inflammation) in the gastric antrum. Biopsies benign, no H. pylori.   ESOPHAGOGASTRODUODENOSCOPY N/A 10/24/2013   NGE:[id number] DISTAL ESOPHAGEAL WEBSmall hiatal herniaMILD Non-erosive gastritis. SB bx benign. gastric bx, minimal inflammation.    GANGLION CYST EXCISION     from L wrist and pins placed for Fx involving L thumb   HAND SURGERY  at the age of 3   Brother accidentally cut it   MALONEY DILATION N/A 10/24/2013   Procedure: Elease Hashimoto DILATION;  Surgeon: [name], MD;  Location: [facility];  Service: Endoscopy;  Laterality: N/A;   MASS EXCISION  04/29/2012   Procedure: EXCISION MASS;  Surgeon: [name], MD;  Location: [facility];  Service: General;  Laterality: Right;  Excision of Vascular Mass Right Arm   SAVORY DILATION N/A 10/24/2013   Procedure: SAVORY DILATION;  Surgeon: Duncan Dull  Loreen Freud, MD;  Location: [facility];  Service: Endoscopy;  Laterality: N/A;   SHOULDER SURGERY Left 2017   TUMOR REMOVAL  1994   Benign from brain at [facility]   UPPER GASTROINTESTINAL ENDOSCOPY  JAN 2011   GASTRIC ULCER-EOS GRANULOMA   UPPER GASTROINTESTINAL ENDOSCOPY  JUN 2011   GASTRIC ULCER- EOS, NO H. PYLORI   UPPER GASTROINTESTINAL ENDOSCOPY  NOV 2011   GASTRIC NODULE, no ulcer- EOS   XI ROBOTIC ASSISTED INGUINAL HERNIA REPAIR WITH MESH Right 09/25/2022    Procedure: XI ROBOTIC ASSISTED INGUINAL HERNIA REPAIR WITH MESH;  Surgeon: [name], DO;  Location: [facility];  Service: General;  Laterality: Right;    Family History  Problem Relation Age of Onset   Hypertension Mother    Diabetes Mother    Hyperlipidemia Mother    Thyroid disease Mother    Hypertension Father    Diabetes Father    Stroke Father    Colon cancer Maternal Grandmother        OVER 67    Social History   Socioeconomic History   Marital status: Married    Spouse name: Adam [name]   Number of children: 1   Years of education: 12   Highest education level: Not on file  Occupational History   Occupation: disabled  Tobacco Use   Smoking status: Former    Current packs/day: 0.00    Average packs/day: 0.3 packs/day for 15.0 years (3.8 ttl pk-yrs)    Types: Cigars, Cigarettes    Start date: 04/30/1987    Quit date: 04/29/2002    Years since quitting: 21.0   Smokeless tobacco: Never  Vaping Use   Vaping status: Never Used  Substance and Sexual Activity   Alcohol use: Not Currently   Drug use: No   Sexual activity: Yes    Birth control/protection: None  Other Topics Concern   Not on file  Social History Narrative   Lives w/ wife   Caffeine use: 1 cup coffee every morning   Social Drivers of Health   Financial Resource Strain: Low Risk  (05/19/2022)   Overall Financial Resource Strain (CARDIA)    Difficulty of Paying Living Expenses: Not hard at all  Food Insecurity: No Food Insecurity (05/19/2022)   Hunger Vital Sign    Worried About Running Out of Food in the Last Year: Never true    Ran Out of Food in the Last Year: Never true  Transportation Needs: No Transportation Needs (05/19/2022)   PRAPARE - Administrator, Civil Service (Medical): No    Lack of Transportation (Non-Medical): No  Physical Activity: Sufficiently Active (05/19/2022)   Exercise Vital Sign    Days of Exercise per Week: 5 days    Minutes of Exercise per Session:  30 min  Stress: No Stress Concern Present (05/19/2022)   [employer] of Occupational Health - Occupational Stress Questionnaire    Feeling of Stress : Not at all  Social Connections: Moderately Integrated (05/19/2022)   Social Connection and Isolation Panel [NHANES]    Frequency of Communication with Friends and Family: More than three times a week    Frequency of Social Gatherings with Friends and Family: Three times a week    Attends Religious Services: More than 4 times per year    Active Member of Clubs or Organizations: No    Attends Banker Meetings: Never    Marital Status: Married  Catering manager Violence: Not At Risk (05/19/2022)  Humiliation, Afraid, Rape, and Kick questionnaire    Fear of Current or Ex-Partner: No    Emotionally Abused: No    Physically Abused: No    Sexually Abused: No    Outpatient Medications Prior to Visit  Medication Sig Dispense Refill   docusate sodium (COLACE) 100 MG capsule Take 1 capsule (100 mg total) by mouth 2 (two) times daily. 60 capsule 2   doxazosin (CARDURA) 8 MG tablet TAKE 1 TABLET AT BEDTIME 90 tablet 3   hydrALAZINE (APRESOLINE) 25 MG tablet Take 1 tablet (25 mg total) by mouth 2 (two) times daily. 180 tablet 1   loratadine (CLARITIN) 10 MG tablet Take 10 mg by mouth daily as needed for allergies.      pantoprazole (PROTONIX) 40 MG tablet TAKE 1 TABLET TWICE DAILY 180 tablet 3   PARoxetine (PAXIL) 20 MG tablet TAKE 1 TABLET EVERY DAY 90 tablet 3   pravastatin (PRAVACHOL) 20 MG tablet TAKE 1 TABLET AT BEDTIME (NEED MD APPOINTMENT) 90 tablet 3   valsartan-hydrochlorothiazide (DIOVAN-HCT) 320-25 MG tablet Take 1 tablet by mouth daily. 90 tablet 3   verapamil (CALAN-SR) 180 MG CR tablet TAKE 1 TABLET TWICE DAILY 180 tablet 3   hydrocortisone (ANUSOL-HC) 25 MG suppository Place 1 suppository (25 mg total) rectally 2 (two) times daily. 12 suppository 0   loperamide (IMODIUM A-D) 2 MG tablet Take 1 tablet (2 mg total) by  mouth 4 (four) times daily as needed for diarrhea or loose stools. 30 tablet 0   No facility-administered medications prior to visit.    Allergies  Allergen Reactions   Sulfonamide Derivatives     REACTION: causes hives    ROS Review of Systems  Constitutional:  Negative for chills and fever.  HENT:  Negative for congestion and sore throat.   Eyes:  Negative for pain and discharge.  Respiratory:  Negative for cough and shortness of breath.   Cardiovascular:  Negative for chest pain and palpitations.  Gastrointestinal:  Positive for constipation. Negative for diarrhea, nausea and vomiting.  Endocrine: Negative for polydipsia and polyuria.  Genitourinary:  Negative for dysuria and hematuria.  Musculoskeletal:  Positive for arthralgias. Negative for neck pain and neck stiffness.  Skin:  Positive for rash.  Neurological:  Negative for dizziness, weakness, numbness and headaches.  Psychiatric/Behavioral:  Negative for agitation and behavioral problems.       Objective:    Physical Exam Vitals reviewed.  Constitutional:      General: He is not in acute distress.    Appearance: He is not diaphoretic.  HENT:     Head: Normocephalic and atraumatic.     Nose: Nose normal.     Mouth/Throat:     Mouth: Mucous membranes are moist.  Eyes:     General: No scleral icterus.    Extraocular Movements: Extraocular movements intact.  Cardiovascular:     Rate and Rhythm: Normal rate and regular rhythm.     Pulses: Normal pulses.     Heart sounds: Murmur heard.  Pulmonary:     Breath sounds: Normal breath sounds. No wheezing or rales.  Abdominal:     Palpations: Abdomen is soft.     Tenderness: There is no abdominal tenderness.  Musculoskeletal:     Cervical back: Neck supple. No tenderness.     Right lower leg: No edema.     Left lower leg: No edema.  Skin:    General: Skin is warm.     Findings: No rash.  Comments: S/p excision of mole over scalp  Neurological:     General:  No focal deficit present.     Mental Status: He is alert and oriented to person, place, and time.  Psychiatric:        Mood and Affect: Mood normal.        Behavior: Behavior normal.     BP 123/70 (BP Location: Left Arm, Patient Position: Sitting, Cuff Size: Normal)   Pulse 60   Ht 5\' 4"  (1.626 m)   Wt 167 lb 12.8 oz (76.1 kg)   SpO2 96%   BMI 28.80 kg/m  Wt Readings from Last 3 Encounters:  05/25/23 167 lb 12.8 oz (76.1 kg)  04/05/23 169 lb (76.7 kg)  01/12/23 163 lb 9.6 oz (74.2 kg)    Lab Results  Component Value Date   TSH 1.270 03/02/2022   Lab Results  Component Value Date   WBC 8.2 01/04/2023   HGB 15.1 01/04/2023   HCT 44.8 01/04/2023   MCV 85 01/04/2023   PLT 304 01/04/2023   Lab Results  Component Value Date   NA 139 01/04/2023   K 4.2 01/04/2023   CO2 25 01/04/2023   GLUCOSE 78 01/04/2023   BUN 19 01/04/2023   CREATININE 1.18 01/04/2023   BILITOT 0.8 01/04/2023   ALKPHOS 55 01/04/2023   AST 24 01/04/2023   ALT 20 01/04/2023   PROT 6.3 01/04/2023   ALBUMIN 4.2 01/04/2023   CALCIUM 9.8 01/04/2023   ANIONGAP 9 08/01/2022   EGFR 65 01/04/2023   Lab Results  Component Value Date   CHOL 181 01/04/2023   Lab Results  Component Value Date   HDL 73 01/04/2023   Lab Results  Component Value Date   LDLCALC 96 01/04/2023   Lab Results  Component Value Date   TRIG 65 01/04/2023   Lab Results  Component Value Date   CHOLHDL 2.5 01/04/2023   Lab Results  Component Value Date   HGBA1C 5.6 01/04/2023      Assessment & Plan:   Problem List Items Addressed This Visit       Cardiovascular and Mediastinum   Essential hypertension   BP Readings from Last 1 Encounters:  05/25/23 123/70   Well-controlled with Verapamil 180 mg BID, Valsartan-HCTZ 320-25 mg QD, Hydralazine 25 mg BID and Doxazosin 8 mg QD Counseled for compliance with the medications Advised DASH diet and moderate exercise/walking, at least 150 mins/week      Relevant Orders    TSH   CMP14+EGFR   CBC with Differential/Platelet   Hemorrhoids   Refilled Anusol suppository      Relevant Medications   hydrocortisone (ANUSOL-HC) 25 MG suppository     Digestive   GERD (gastroesophageal reflux disease)   Well controlled, on Pantoprazole      IBS (irritable bowel syndrome)   Chronic constipation Advised to take Senokot-S twice daily as needed MiraLAX as needed for persistent constipation Maintain adequate hydration, can also try prune juice        Musculoskeletal and Integument   Degeneration of intervertebral disc of lumbar region with discogenic back pain   Has chronic low back pain Recent x-ray of lumbar spine showed DDD of lumbar spine and mild anterolisthesis at L4-L5        Genitourinary   BPH (benign prostatic hyperplasia)   Has urinary retention - now improved On Doxazosin 8 mg QD PSA wnl      Relevant Orders   PSA     Other  Hyperlipidemia   On Pravastatin 20 mg QD      Relevant Orders   Lipid panel   Encounter for general adult medical examination with abnormal findings - Primary   Physical exam as documented. Counseling done  re healthy lifestyle involving commitment to 150 minutes exercise per week, heart healthy diet, and attaining healthy weight.The importance of adequate sleep also discussed. Immunization and cancer screening needs are specifically addressed at this visit.      Vitamin D deficiency   Relevant Orders   VITAMIN D 25 Hydroxy (Vit-D Deficiency, Fractures)   Other Visit Diagnoses       Colon cancer screening       Relevant Orders   Ambulatory referral to Gastroenterology        Meds ordered this encounter  Medications   hydrocortisone (ANUSOL-HC) 25 MG suppository    Sig: Place 1 suppository (25 mg total) rectally 2 (two) times daily as needed for hemorrhoids or anal itching.    Dispense:  12 suppository    Refill:  0    Follow-up: Return in about 6 months (around 11/23/2023) for HTN.     Anabel Halon, MD

## 2023-05-26 DIAGNOSIS — E782 Mixed hyperlipidemia: Secondary | ICD-10-CM | POA: Diagnosis not present

## 2023-05-26 DIAGNOSIS — R338 Other retention of urine: Secondary | ICD-10-CM | POA: Diagnosis not present

## 2023-05-26 DIAGNOSIS — N401 Enlarged prostate with lower urinary tract symptoms: Secondary | ICD-10-CM | POA: Diagnosis not present

## 2023-05-26 DIAGNOSIS — I10 Essential (primary) hypertension: Secondary | ICD-10-CM | POA: Diagnosis not present

## 2023-05-26 DIAGNOSIS — E559 Vitamin D deficiency, unspecified: Secondary | ICD-10-CM | POA: Diagnosis not present

## 2023-05-27 ENCOUNTER — Encounter (INDEPENDENT_AMBULATORY_CARE_PROVIDER_SITE_OTHER): Payer: Self-pay | Admitting: *Deleted

## 2023-05-27 LAB — CMP14+EGFR
ALT: 15 [IU]/L (ref 0–44)
AST: 17 [IU]/L (ref 0–40)
Albumin: 4.1 g/dL (ref 3.8–4.8)
Alkaline Phosphatase: 62 [IU]/L (ref 44–121)
BUN/Creatinine Ratio: 17 (ref 10–24)
BUN: 22 mg/dL (ref 8–27)
Bilirubin Total: 0.5 mg/dL (ref 0.0–1.2)
CO2: 22 mmol/L (ref 20–29)
Calcium: 9.9 mg/dL (ref 8.6–10.2)
Chloride: 100 mmol/L (ref 96–106)
Creatinine, Ser: 1.28 mg/dL — ABNORMAL HIGH (ref 0.76–1.27)
Globulin, Total: 2.2 g/dL (ref 1.5–4.5)
Glucose: 88 mg/dL (ref 70–99)
Potassium: 3.7 mmol/L (ref 3.5–5.2)
Sodium: 140 mmol/L (ref 134–144)
Total Protein: 6.3 g/dL (ref 6.0–8.5)
eGFR: 59 mL/min/{1.73_m2} — ABNORMAL LOW (ref >59)

## 2023-05-27 LAB — LIPID PANEL
Chol/HDL Ratio: 2.8 {ratio} (ref 0.0–5.0)
Cholesterol, Total: 188 mg/dL (ref 100–199)
HDL: 67 mg/dL (ref >39)
LDL Chol Calc (NIH): 107 mg/dL — ABNORMAL HIGH (ref 0–99)
Triglycerides: 75 mg/dL (ref 0–149)
VLDL Cholesterol Cal: 14 mg/dL (ref 5–40)

## 2023-05-27 LAB — CBC WITH DIFFERENTIAL/PLATELET
Basophils Absolute: 0.1 10*3/uL (ref 0.0–0.2)
Basos: 2 %
EOS (ABSOLUTE): 0.3 10*3/uL (ref 0.0–0.4)
Eos: 6 %
Hematocrit: 41.6 % (ref 37.5–51.0)
Hemoglobin: 13.7 g/dL (ref 13.0–17.7)
Immature Grans (Abs): 0 10*3/uL (ref 0.0–0.1)
Immature Granulocytes: 0 %
Lymphocytes Absolute: 1.4 10*3/uL (ref 0.7–3.1)
Lymphs: 28 %
MCH: 27.8 pg (ref 26.6–33.0)
MCHC: 32.9 g/dL (ref 31.5–35.7)
MCV: 85 fL (ref 79–97)
Monocytes Absolute: 0.5 10*3/uL (ref 0.1–0.9)
Monocytes: 10 %
Neutrophils Absolute: 2.8 10*3/uL (ref 1.4–7.0)
Neutrophils: 54 %
Platelets: 319 10*3/uL (ref 150–450)
RBC: 4.92 x10E6/uL (ref 4.14–5.80)
RDW: 13.2 % (ref 11.6–15.4)
WBC: 5.2 10*3/uL (ref 3.4–10.8)

## 2023-05-27 LAB — PSA: Prostate Specific Ag, Serum: 4.4 ng/mL — ABNORMAL HIGH (ref 0.0–4.0)

## 2023-05-27 LAB — VITAMIN D 25 HYDROXY (VIT D DEFICIENCY, FRACTURES): Vit D, 25-Hydroxy: 25.4 ng/mL — ABNORMAL LOW (ref 30.0–100.0)

## 2023-05-27 LAB — TSH: TSH: 2.07 u[IU]/mL (ref 0.450–4.500)

## 2023-06-01 ENCOUNTER — Other Ambulatory Visit: Payer: Self-pay | Admitting: Internal Medicine

## 2023-06-01 DIAGNOSIS — I10 Essential (primary) hypertension: Secondary | ICD-10-CM

## 2023-06-02 ENCOUNTER — Other Ambulatory Visit: Payer: Self-pay | Admitting: Internal Medicine

## 2023-06-02 DIAGNOSIS — I10 Essential (primary) hypertension: Secondary | ICD-10-CM

## 2023-06-21 ENCOUNTER — Ambulatory Visit (INDEPENDENT_AMBULATORY_CARE_PROVIDER_SITE_OTHER): Payer: Medicare HMO

## 2023-06-21 ENCOUNTER — Telehealth: Payer: Self-pay | Admitting: *Deleted

## 2023-06-21 VITALS — Ht 64.0 in | Wt 163.0 lb

## 2023-06-21 DIAGNOSIS — Z0000 Encounter for general adult medical examination without abnormal findings: Secondary | ICD-10-CM | POA: Diagnosis not present

## 2023-06-21 NOTE — Telephone Encounter (Signed)
  Procedure: Colonoscopy  Estimated body mass index is 27.98 kg/m as calculated from the following:   Height as of an earlier encounter on 06/21/23: 5' 4 (1.626 m).   Weight as of an earlier encounter on 06/21/23: 163 lb (73.9 kg).   Have you had a colonoscopy before?  10/24/13, Dr. Harvey  Do you have family history of colon cancer?  no  Do you have a family history of polyps? no  Previous colonoscopy with polyps removed? no  Do you have a history colorectal cancer?   no  Are you diabetic?  no  Do you have a prosthetic or mechanical heart valve? no  Do you have a pacemaker/defibrillator?   no  Have you had endocarditis/atrial fibrillation?  no  Do you use supplemental oxygen/CPAP?  no  Have you had joint replacement within the last 12 months?  no  Do you tend to be constipated or have to use laxatives?  no   Do you have history of alcohol use? If yes, how much and how often.  no  Do you have history or are you using drugs? If yes, what do are you  using?  no  Have you ever had a stroke/heart attack?  no  Have you ever had a heart or other vascular stent placed,?no  Do you take weight loss medication? no  Do you take any blood-thinning medications such as: (Plavix, aspirin, Coumadin, Aggrenox, Brilinta, Xarelto, Eliquis, Pradaxa, Savaysa or Effient)? no  If yes we need the name, milligram, dosage and who is prescribing doctor:               Current Outpatient Medications  Medication Sig Dispense Refill   docusate sodium  (COLACE) 100 MG capsule Take 1 capsule (100 mg total) by mouth 2 (two) times daily. 60 capsule 2   doxazosin  (CARDURA ) 8 MG tablet TAKE 1 TABLET AT BEDTIME 90 tablet 3   hydrALAZINE  (APRESOLINE ) 25 MG tablet TAKE 1 TABLET TWICE DAILY 180 tablet 3   hydrocortisone  (ANUSOL -HC) 25 MG suppository Place 1 suppository (25 mg total) rectally 2 (two) times daily as needed for hemorrhoids or anal itching. 12 suppository 0   loratadine (CLARITIN) 10 MG tablet  Take 10 mg by mouth daily as needed for allergies.      pantoprazole  (PROTONIX ) 40 MG tablet TAKE 1 TABLET TWICE DAILY 180 tablet 3   PARoxetine  (PAXIL ) 20 MG tablet TAKE 1 TABLET EVERY DAY 90 tablet 3   pravastatin  (PRAVACHOL ) 20 MG tablet TAKE 1 TABLET AT BEDTIME (NEED MD APPOINTMENT) 90 tablet 3   valsartan -hydrochlorothiazide  (DIOVAN -HCT) 320-25 MG tablet Take 1 tablet by mouth daily. 90 tablet 3   verapamil  (CALAN -SR) 180 MG CR tablet TAKE 1 TABLET TWICE DAILY 180 tablet 3   No current facility-administered medications for this visit.    Allergies  Allergen Reactions   Sulfonamide Derivatives     REACTION: causes hives

## 2023-06-21 NOTE — Patient Instructions (Signed)
 Adam [name] , Thank you for taking time to come for your Medicare Wellness Visit. I appreciate your ongoing commitment to your health goals. Please review the following plan we discussed and let me know if I can assist you in the future.   Referrals/Orders/Follow-Ups/Clinician Recommendations:   Next Medicare Annual Wellness Visit: June 21, 2024 at 9:20 am virtual visit  This is a list of the screening recommended for you and due dates:  Health Maintenance  Topic Date Due   Colon Cancer Screening  10/25/2023   Medicare Annual Wellness Visit  06/20/2024   DTaP/Tdap/Td vaccine (3 - Td or Tdap) 09/21/2029   Pneumonia Vaccine  Completed   Flu Shot  Completed   Hepatitis C Screening  Completed   Zoster (Shingles) Vaccine  Completed   HPV Vaccine  Aged Out   COVID-19 Vaccine  Discontinued    Advanced directives: (Declined) Advance directive discussed with you today. Even though you declined this today, please call our office should you change your mind, and we can give you the proper paperwork for you to fill out.  Next Medicare Annual Wellness Visit scheduled for next year: yes  Preventive Care 37 Years and Older, Male Preventive care refers to lifestyle choices and visits with your health care provider that can promote health and wellness. Preventive care visits are also called wellness exams. What can I expect for my preventive care visit? Counseling During your preventive care visit, your health care provider may ask about your: Medical history, including: Past medical problems. Family medical history. History of falls. Current health, including: Emotional well-being. Home life and relationship well-being. Sexual activity. Memory and ability to understand (cognition). Lifestyle, including: Alcohol, nicotine or tobacco, and drug use. Access to firearms. Diet, exercise, and sleep habits. Work and work astronomer. Sunscreen use. Safety issues such as seatbelt and bike  helmet use. Physical exam Your health care provider will check your: Height and weight. These may be used to calculate your BMI (body mass index). BMI is a measurement that tells if you are at a healthy weight. Waist circumference. This measures the distance around your waistline. This measurement also tells if you are at a healthy weight and may help predict your risk of certain diseases, such as type 2 diabetes and high blood pressure. Heart rate and blood pressure. Body temperature. Skin for abnormal spots. What immunizations do I need?  Vaccines are usually given at various ages, according to a schedule. Your health care provider will recommend vaccines for you based on your age, medical history, and lifestyle or other factors, such as travel or where you work. What tests do I need? Screening Your health care provider may recommend screening tests for certain conditions. This may include: Lipid and cholesterol levels. Diabetes screening. This is done by checking your blood sugar (glucose) after you have not eaten for a while (fasting). Hepatitis C test. Hepatitis B test. HIV (human immunodeficiency virus) test. STI (sexually transmitted infection) testing, if you are at risk. Lung cancer screening. Colorectal cancer screening. Prostate cancer screening. Abdominal aortic aneurysm (AAA) screening. You may need this if you are a current or former smoker. Talk with your health care provider about your test results, treatment options, and if necessary, the need for more tests. Follow these instructions at home: Eating and drinking  Eat a diet that includes fresh fruits and vegetables, whole grains, lean protein, and low-fat dairy products. Limit your intake of foods with high amounts of sugar, saturated fats, and salt. Take  vitamin and mineral supplements as recommended by your health care provider. Do not drink alcohol if your health care provider tells you not to drink. If you drink  alcohol: Limit how much you have to 0-2 drinks a day. Know how much alcohol is in your drink. In the [place] one drink equals one 12 oz bottle of beer (355 mL), one 5 oz glass of wine (148 mL), or one 1 oz glass of hard liquor (44 mL). Lifestyle Brush your teeth every morning and night with fluoride toothpaste. Floss one time each day. Exercise for at least 30 minutes 5 or more days each week. Do not use any products that contain nicotine or tobacco. These products include cigarettes, chewing tobacco, and vaping devices, such as e-cigarettes. If you need help quitting, ask your health care provider. Do not use drugs. If you are sexually active, practice safe sex. Use a condom or other form of protection to prevent STIs. Take aspirin only as told by your health care provider. Make sure that you understand how much to take and what form to take. Work with your health care provider to find out whether it is safe and beneficial for you to take aspirin daily. Ask your health care provider if you need to take a cholesterol-lowering medicine (statin). Find healthy ways to manage stress, such as: Meditation, yoga, or listening to music. Journaling. Talking to a trusted person. Spending time with friends and family. Safety Always wear your seat belt while driving or riding in a vehicle. Do not drive: If you have been drinking alcohol. Do not ride with someone who has been drinking. When you are tired or distracted. While texting. If you have been using any mind-altering substances or drugs. Wear a helmet and other protective equipment during sports activities. If you have firearms in your house, make sure you follow all gun safety procedures. Minimize exposure to UV radiation to reduce your risk of skin cancer. What's next? Visit your health care provider once a year for an annual wellness visit. Ask your health care provider how often you should have your eyes and teeth checked. Stay up to date  on all vaccines. This information is not intended to replace advice given to you by your health care provider. Make sure you discuss any questions you have with your health care provider. Document Revised: 11/20/2020 Document Reviewed: 11/20/2020 Elsevier Patient Education  2024 [employer].   Understanding Your Risk for Falls Millions of people have serious injuries from falls each year. It is important to understand your risk of falling. Talk with your health care provider about your risk and what you can do to lower it. If you do have a serious fall, make sure to tell your provider. Falling once raises your risk of falling again. How can falls affect me? Serious injuries from falls are common. These include: Broken bones, such as hip fractures. Head injuries, such as traumatic brain injuries (TBI) or concussions. A fear of falling can cause you to avoid activities and stay at home. This can make your muscles weaker and raise your risk for a fall. What can increase my risk? There are a number of risk factors that increase your risk for falling. The more risk factors you have, the higher your risk of falling. Serious injuries from a fall happen most often to people who are older than 75 years old. Teenagers and young adults ages 78-29 are also at higher risk. Common risk factors include: Weakness in the lower  body. Being generally weak or confused due to long-term (chronic) illness. Dizziness or balance problems. Poor vision. Medicines that cause dizziness or drowsiness. These may include: Medicines for your blood pressure, heart, anxiety, insomnia, or swelling (edema). Pain medicines. Muscle relaxants. Other risk factors include: Drinking alcohol. Having had a fall in the past. Having foot pain or wearing improper footwear. Working at a dangerous job. Having any of the following in your home: Tripping hazards, such as floor clutter or loose rugs. Poor lighting. Pets. Having  dementia or memory loss. What actions can I take to lower my risk of falling?     Physical activity Stay physically fit. Do strength and balance exercises. Consider taking a regular class to build strength and balance. Yoga and tai chi are good options. Vision Have your eyes checked every year and your prescription for glasses or contacts updated as needed. Shoes and walking aids Wear non-skid shoes. Wear shoes that have rubber soles and low heels. Do not wear high heels. Do not walk around the house in socks or slippers. Use a cane or walker as told by your provider. Home safety Attach secure railings on both sides of your stairs. Install grab bars for your bathtub, shower, and toilet. Use a non-skid mat in your bathtub or shower. Attach bath mats securely with double-sided, non-slip rug tape. Use good lighting in all rooms. Keep a flashlight near your bed. Make sure there is a clear path from your bed to the bathroom. Use night-lights. Do not use throw rugs. Make sure all carpeting is taped or tacked down securely. Remove all clutter from walkways and stairways, including extension cords. Repair uneven or broken steps and floors. Avoid walking on icy or slippery surfaces. Walk on the grass instead of on icy or slick sidewalks. Use ice melter to get rid of ice on walkways in the winter. Use a cordless phone. Questions to ask your health care provider Can you help me check my risk for a fall? Do any of my medicines make me more likely to fall? Should I take a vitamin D  supplement? What exercises can I do to improve my strength and balance? Should I make an appointment to have my vision checked? Do I need a bone density test to check for weak bones (osteoporosis)? Would it help to use a cane or a walker? Where to find more information Centers for Disease Control and Prevention, [employer]: tonerpromos.no Community-Based Fall Prevention Programs: tonerpromos.no [employer] on Aging:  baseringtones.pl Contact a health care provider if: You fall at home. You are afraid of falling at home. You feel weak, drowsy, or dizzy. This information is not intended to replace advice given to you by your health care provider. Make sure you discuss any questions you have with your health care provider. Document Revised: 01/26/2022 Document Reviewed: 01/26/2022 Elsevier Patient Education  2024 [employer].

## 2023-06-21 NOTE — Progress Notes (Signed)
 Because this visit was a virtual/telehealth visit,  certain criteria was not obtained, such a blood pressure, CBG if applicable, and timed get up and go. Any medications not marked as taking were not mentioned during the medication reconciliation part of the visit. Any vitals not documented were not able to be obtained due to this being a telehealth visit or patient was unable to self-report a recent blood pressure reading due to a lack of equipment at home via telehealth. Vitals that have been documented are verbally provided by the patient.  Interactive audio and video telecommunications were attempted between this provider and patient, however failed, due to patient having technical difficulties OR patient did not have access to video capability.  We continued and completed visit with audio only.  Subjective:   Adam [name] is a 73 y.o. male who presents for Medicare Annual/Subsequent preventive examination.  Visit Complete: Virtual I connected with  Adam [name] on 06/21/23 by a audio enabled telemedicine application and verified that I am speaking with the correct person using two identifiers.  Patient Location: Home  Provider Location: Home Office  I discussed the limitations of evaluation and management by telemedicine. The patient expressed understanding and agreed to proceed.  Vital Signs: Because this visit was a virtual/telehealth visit, some criteria may be missing or patient reported. Any vitals not documented were not able to be obtained and vitals that have been documented are patient reported.  Patient Medicare AWV questionnaire was completed by the patient on na; I have confirmed that all information answered by patient is correct and no changes since this date.  Cardiac Risk Factors include: advanced age (>54men, >59 women);dyslipidemia;hypertension;male gender;sedentary lifestyle     Objective:    Today's Vitals   06/21/23 1056  Weight: 163 lb (73.9 kg)   Height: 5' 4 (1.626 m)   Body mass index is 27.98 kg/m.     06/21/2023   10:58 AM 09/23/2022    9:27 AM 08/01/2022   10:22 PM 05/19/2022    2:02 PM 05/07/2021    1:32 PM 04/15/2019    9:24 PM 05/11/2016    9:55 AM  Advanced Directives  Does Patient Have a Medical Advance Directive? No No No No No No No  Would patient like information on creating a medical advance directive? No - Patient declined No - Patient declined No - Patient declined No - Patient declined Yes (ED - Information included in AVS)  No - Patient declined    Current Medications (verified) Outpatient Encounter Medications as of 06/21/2023  Medication Sig   docusate sodium  (COLACE) 100 MG capsule Take 1 capsule (100 mg total) by mouth 2 (two) times daily.   doxazosin  (CARDURA ) 8 MG tablet TAKE 1 TABLET AT BEDTIME   hydrALAZINE  (APRESOLINE ) 25 MG tablet TAKE 1 TABLET TWICE DAILY   hydrocortisone  (ANUSOL -HC) 25 MG suppository Place 1 suppository (25 mg total) rectally 2 (two) times daily as needed for hemorrhoids or anal itching.   loratadine (CLARITIN) 10 MG tablet Take 10 mg by mouth daily as needed for allergies.    pantoprazole  (PROTONIX ) 40 MG tablet TAKE 1 TABLET TWICE DAILY   PARoxetine  (PAXIL ) 20 MG tablet TAKE 1 TABLET EVERY DAY   pravastatin  (PRAVACHOL ) 20 MG tablet TAKE 1 TABLET AT BEDTIME (NEED MD APPOINTMENT)   valsartan -hydrochlorothiazide  (DIOVAN -HCT) 320-25 MG tablet Take 1 tablet by mouth daily.   verapamil  (CALAN -SR) 180 MG CR tablet TAKE 1 TABLET TWICE DAILY   No facility-administered encounter medications on file as  of 06/21/2023.    Allergies (verified) Sulfonamide derivatives   History: Past Medical History:  Diagnosis Date   Allergy    BMI 31.0-31.9,adult JUNE 2011 180 LBS    Chronic back pain    Depression    Eosinophilic granuloma (HCC) JAN 2011 GASTRIC, followed by Dr. Cathern   EGD JAN 2011, JUN 2011, NOV 2011   Gastric ulcer 08/31/2012   GERD (gastroesophageal reflux disease)     HTN (hypertension)    Hyperlipemia    IBS (irritable bowel syndrome)    Prostate hypertrophy    Past Surgical History:  Procedure Laterality Date   BACK SURGERY     COLONOSCOPY  JAN 2011 COMPLETE   hypoTN and bradycardia w/ Demerol  ,Phenergan and Versed , [place] TICS, SML IH   COLONOSCOPY  2003   NUR-normal   COLONOSCOPY  06/21/09   Fields-sigmoid diverticulosis, sm int hemorrhoids   COLONOSCOPY N/A 10/24/2013   Normal mucosa in the terminal ileum Moderate diverticulosis noted in the sigmoid colonModerate sized internal hemorrhoids. negative random colon bx.. next TCS 10/2023   Cyst Removed     from Left leg and knee   ESOPHAGOGASTRODUODENOSCOPY  05/20/2012   SLF: MILD ESOPHAGITIS.NO BARRETT'S/Multiple ulcers ranging between 3-5 mm/MILD Duodenal inflammation was found in the duodenal bulb   ESOPHAGOGASTRODUODENOSCOPY N/A 09/05/2012   SLF: MILD Non-erosive gastritis (inflammation) in the gastric antrum. Biopsies benign, no H. pylori.   ESOPHAGOGASTRODUODENOSCOPY N/A 10/24/2013   DOQ:ENDDPAOZ DISTAL ESOPHAGEAL WEBSmall hiatal herniaMILD Non-erosive gastritis. SB bx benign. gastric bx, minimal inflammation.    GANGLION CYST EXCISION     from L wrist and pins placed for Fx involving L thumb   HAND SURGERY  at the age of 3   Brother accidentally cut it   MALONEY DILATION N/A 10/24/2013   Procedure: AGAPITO DILATION;  Surgeon: Margo LITTIE Haddock, MD;  [facility];  Service: Endoscopy;  Laterality: N/A;   MASS EXCISION  04/29/2012   Procedure: EXCISION MASS;  Surgeon: Thresa JAYSON Pulling, MD;  Location: [facility];  Service: General;  Laterality: Right;  Excision of Vascular Mass Right Arm   SAVORY DILATION N/A 10/24/2013   Procedure: SAVORY DILATION;  Surgeon: Margo LITTIE Haddock, MD;  Location: [facility];  Service: Endoscopy;  Laterality: N/A;   SHOULDER SURGERY Left 2017   TUMOR REMOVAL  1994   Benign from brain at [facility]   UPPER GASTROINTESTINAL ENDOSCOPY  JAN 2011   GASTRIC ULCER-EOS  GRANULOMA   UPPER GASTROINTESTINAL ENDOSCOPY  JUN 2011   GASTRIC ULCER- EOS, NO H. PYLORI   UPPER GASTROINTESTINAL ENDOSCOPY  NOV 2011   GASTRIC NODULE, no ulcer- EOS   XI ROBOTIC ASSISTED INGUINAL HERNIA REPAIR WITH MESH Right 09/25/2022   Procedure: XI ROBOTIC ASSISTED INGUINAL HERNIA REPAIR WITH MESH;  Surgeon: Evonnie Dorothyann LABOR, DO;  Location: [facility];  Service: General;  Laterality: Right;   Family History  Problem Relation Age of Onset   Hypertension Mother    Diabetes Mother    Hyperlipidemia Mother    Thyroid  disease Mother    Hypertension Father    Diabetes Father    Stroke Father    Colon cancer Maternal Grandmother        OVER 72   Social History   Socioeconomic History   Marital status: Married    Spouse name: Heron   Number of children: 1   Years of education: 12   Highest education level: Not on file  Occupational History   Occupation:  disabled  Tobacco Use   Smoking status: Former    Current packs/day: 0.00    Average packs/day: 0.3 packs/day for 15.0 years (3.8 ttl pk-yrs)    Types: Cigars, Cigarettes    Start date: 04/30/1987    Quit date: 04/29/2002    Years since quitting: 21.1   Smokeless tobacco: Never  Vaping Use   Vaping status: Never Used  Substance and Sexual Activity   Alcohol use: Not Currently   Drug use: No   Sexual activity: Yes    Birth control/protection: None  Other Topics Concern   Not on file  Social History Narrative   Lives w/ wife   Caffeine use: 1 cup coffee every morning   Social Drivers of Health   Financial Resource Strain: Low Risk  (06/21/2023)   Overall Financial Resource Strain (CARDIA)    Difficulty of Paying Living Expenses: Not hard at all  Food Insecurity: No Food Insecurity (06/21/2023)   Hunger Vital Sign    Worried About Running Out of Food in the Last Year: Never true    Ran Out of Food in the Last Year: Never true  Transportation Needs: No Transportation Needs (06/21/2023)   PRAPARE -  Administrator, Civil Service (Medical): No    Lack of Transportation (Non-Medical): No  Physical Activity: Sufficiently Active (06/21/2023)   Exercise Vital Sign    Days of Exercise per Week: 7 days    Minutes of Exercise per Session: 30 min  Stress: No Stress Concern Present (06/21/2023)   [employer] of Occupational Health - Occupational Stress Questionnaire    Feeling of Stress : Not at all  Social Connections: Moderately Integrated (06/21/2023)   Social Connection and Isolation Panel [NHANES]    Frequency of Communication with Friends and Family: More than three times a week    Frequency of Social Gatherings with Friends and Family: More than three times a week    Attends Religious Services: More than 4 times per year    Active Member of [employer] or Organizations: No    Attends Engineer, Structural: Never    Marital Status: Married    Tobacco Counseling Counseling given: Yes   Clinical Intake:  Pre-visit preparation completed: Yes  Pain : No/denies pain     BMI - recorded: 27.98 Nutritional Status: BMI 25 -29 Overweight Nutritional Risks: None Diabetes: No  How often do you need to have someone help you when you read instructions, pamphlets, or other written materials from your doctor or pharmacy?: 1 - Never  Interpreter Needed?: No  Information entered by :: Cohen Boettner W, CMa   Activities of Daily Living    06/21/2023   10:57 AM 09/23/2022    9:43 AM  In your present state of health, do you have any difficulty performing the following activities:  Hearing? 0   Vision? 0   Comment Dr. Darroll w/ [facility]   Difficulty concentrating or making decisions? 0   Walking or climbing stairs? 0   Dressing or bathing? 0   Doing errands, shopping? 0 0  Preparing Food and eating ? N   Using the Toilet? N   In the past six months, have you accidently leaked urine? N   Do you have problems with loss of bowel control? N   Managing your Medications? N    Managing your Finances? N   Housekeeping or managing your Housekeeping? N     Patient Care Team: Tobie Suzzane POUR, MD as PCP -  General (Internal Medicine)  Indicate any recent Medical Services you may have received from other than [facility] providers in the past year (date may be approximate).     Assessment:   This is a routine wellness examination for [place].  Hearing/Vision screen Hearing Screening - Comments:: Patient denies any hearing difficulties.   Vision Screening - Comments:: Wears rx glasses - up to date with routine eye exams  Dr. Darroll w/ [facility]   Goals Addressed             This Visit's Progress    Patient Stated       Remain active and healthy       Depression Screen    06/21/2023   11:00 AM 05/25/2023    9:01 AM 04/05/2023   11:25 AM 01/12/2023    8:04 AM 10/22/2022    8:56 AM 10/01/2022    9:16 AM 09/09/2022    8:08 AM  PHQ 2/9 Scores  PHQ - 2 Score 0 1 1 0 0 0 0  PHQ- 9 Score 0 2 2    1     Fall Risk    06/21/2023   10:58 AM 05/25/2023    9:01 AM 01/12/2023    8:04 AM 10/22/2022    8:56 AM 10/01/2022    9:16 AM  Fall Risk   Falls in the past year? 0 0 0 0 0  Number falls in past yr: 0 0 0 0 0  Injury with Fall? 0 0 0 0 0  Risk for fall due to : No Fall Risks No Fall Risks Post anesthesia    Follow up Falls prevention discussed Falls evaluation completed Falls evaluation completed      MEDICARE RISK AT HOME: Medicare Risk at Home Any stairs in or around the home?: Yes If so, are there any without handrails?: No Home free of loose throw rugs in walkways, pet beds, electrical cords, etc?: Yes Adequate lighting in your home to reduce risk of falls?: Yes Life alert?: No Use of a cane, walker or w/c?: No Grab bars in the bathroom?: No Shower chair or bench in shower?: Yes Elevated toilet seat or a handicapped toilet?: Yes  TIMED UP AND GO:  Was the test performed?  No    Cognitive Function:    05/07/2021    1:34 PM  MMSE - Mini  Mental State Exam  Not completed: Unable to complete        06/21/2023   10:59 AM 05/19/2022    2:03 PM 05/07/2021    1:34 PM  6CIT Screen  What Year? 0 points 0 points 0 points  What month? 0 points 0 points 0 points  What time? 0 points 0 points 0 points  Count back from 20 0 points 0 points 0 points  Months in reverse 0 points 0 points 0 points  Repeat phrase 0 points 0 points 0 points  Total Score 0 points 0 points 0 points    Immunizations Immunization History  Administered Date(s) Administered   Fluad Quad(high Dose 65+) 04/30/2020, 02/25/2021, 03/09/2022   Fluad Trivalent(High Dose 65+) 04/05/2023   Influenza Split 03/28/2013   Influenza,inj,Quad PF,6+ Mos 03/19/2014, 02/26/2015, 03/10/2016, 05/06/2017, 02/16/2018, 02/21/2019   Influenza-Unspecified 05/07/2012   Moderna Sars-Covid-2 Vaccination 07/16/2019, 08/16/2019   Pneumococcal Conjugate-13 02/16/2018   Pneumococcal Polysaccharide-23 03/19/2014   Td 06/07/2010   Tdap 09/22/2019   Zoster Recombinant(Shingrix ) 03/20/2019, 08/28/2021    TDAP status: Up to date  Flu Vaccine status: Up to  date  Pneumococcal vaccine status: Up to date  Covid-19 vaccine status: Declined, Education has been provided regarding the importance of this vaccine but patient still declined. Advised may receive this vaccine at local pharmacy or Health Dept.or vaccine clinic. Aware to provide a copy of the vaccination record if obtained from local pharmacy or Health Dept. Verbalized acceptance and understanding.  Qualifies for Shingles Vaccine? No   Zostavax completed No   Shingrix  Completed?: Yes  Screening Tests Health Maintenance  Topic Date Due   Medicare Annual Wellness (AWV)  05/20/2023   Colonoscopy  10/25/2023   DTaP/Tdap/Td (3 - Td or Tdap) 09/21/2029   Pneumonia Vaccine 73+ Years old  Completed   INFLUENZA VACCINE  Completed   Hepatitis C Screening  Completed   Zoster Vaccines- Shingrix   Completed   HPV VACCINES  Aged Out    COVID-19 Vaccine  Discontinued    Health Maintenance  Health Maintenance Due  Topic Date Due   Medicare Annual Wellness (AWV)  05/20/2023    Colorectal cancer screening: Type of screening: Colonoscopy. Completed 01/24/2014. Repeat every 10 years  Lung Cancer Screening: (Low Dose CT Chest recommended if Age 38-80 years, 20 pack-year currently smoking OR have quit w/in 15years.) does not qualify.   Lung Cancer Screening Referral: na  Additional Screening:  Hepatitis C Screening: does not qualify; Completed   Vision Screening: Recommended annual ophthalmology exams for early detection of glaucoma and other disorders of the eye. Is the patient up to date with their annual eye exam?  Yes  Who is the provider or what is the name of the office in which the patient attends annual eye exams? [facility]/ [place] If pt is not established with a provider, would they like to be referred to a provider to establish care? No .   Dental Screening: Recommended annual dental exams for proper oral hygiene  Diabetic Foot Exam: na  Community Resource Referral / Chronic Care Management: CRR required this visit?  No   CCM required this visit?  No     Plan:     I have personally reviewed and noted the following in the patient's chart:   Medical and social history Use of alcohol, tobacco or illicit drugs  Current medications and supplements including opioid prescriptions. Patient is not currently taking opioid prescriptions. Functional ability and status Nutritional status Physical activity Advanced directives List of other physicians Hospitalizations, surgeries, and ER visits in previous 12 months Vitals Screenings to include cognitive, depression, and falls Referrals and appointments  In addition, I have reviewed and discussed with patient certain preventive protocols, quality metrics, and best practice recommendations. A written personalized care plan for preventive services  as well as general preventive health recommendations were provided to patient.     M[place] LABOR Christianne Zacher, CMA   06/21/2023   After Visit Summary: (MyChart) Due to this being a telephonic visit, the after visit summary with patients personalized plan was offered to patient via MyChart

## 2023-06-23 NOTE — Telephone Encounter (Signed)
Copied from CRM [id number]. Topic: Clinical - Medication Question >> Jun 23, 2023  8:20 AM Herbert Seta B wrote: Reason for CRM: Patient calling has congestion/cough x2 days, wants to know if RX can be sent in or what to take OTC for cold.

## 2023-06-24 ENCOUNTER — Other Ambulatory Visit: Payer: Self-pay | Admitting: Internal Medicine

## 2023-06-24 DIAGNOSIS — J069 Acute upper respiratory infection, unspecified: Secondary | ICD-10-CM

## 2023-06-24 MED ORDER — BENZONATATE 200 MG PO CAPS: 200.0000 mg | ORAL_CAPSULE | Freq: Two times a day (BID) | ORAL | 0 refills | Status: DC | PRN

## 2023-06-24 NOTE — Telephone Encounter (Signed)
Pt going to urgent care.

## 2023-06-24 NOTE — Telephone Encounter (Signed)
Copied from CRM [id number]. Topic: General - Other >> Jun 23, 2023  4:13 PM Fuller Mandril wrote: Reason for CRM: Patient called back in to check status of request. Called earlier to see if provider could suggest anything for cough cold and sore throat. Thank You

## 2023-07-05 ENCOUNTER — Encounter: Payer: Self-pay | Admitting: Internal Medicine

## 2023-07-05 ENCOUNTER — Ambulatory Visit: Payer: Self-pay | Admitting: Internal Medicine

## 2023-07-05 ENCOUNTER — Ambulatory Visit (INDEPENDENT_AMBULATORY_CARE_PROVIDER_SITE_OTHER): Payer: Medicare HMO | Admitting: Internal Medicine

## 2023-07-05 VITALS — BP 134/75 | HR 68 | Ht 64.0 in | Wt 173.8 lb

## 2023-07-05 DIAGNOSIS — J0120 Acute ethmoidal sinusitis, unspecified: Secondary | ICD-10-CM | POA: Insufficient documentation

## 2023-07-05 MED ORDER — GUAIFENESIN-CODEINE 100-10 MG/5ML PO SOLN: 5.0000 mL | Freq: Three times a day (TID) | ORAL | 0 refills | Status: DC | PRN

## 2023-07-05 MED ORDER — AMOXICILLIN-POT CLAVULANATE 875-125 MG PO TABS: 1.0000 | ORAL_TABLET | Freq: Two times a day (BID) | ORAL | 0 refills | Status: DC

## 2023-07-05 MED ORDER — FLUTICASONE PROPIONATE 50 MCG/ACT NA SUSP: 2.0000 | Freq: Every day | NASAL | 1 refills | Status: DC

## 2023-07-05 NOTE — Telephone Encounter (Signed)
Copied from CRM [id number]. Topic: Clinical - Red Word Triage >> Jul 05, 2023 10:04 AM Adam [name] wrote: Red Word that prompted transfer to Nurse Triage: Patient has had a on & off cold. Patient says he has a consistent cough with phlegm and runny nose. Patient is experiencing pain in his eyes as well.   Chief Complaint: worsening cough Symptoms: productive cough to yellow sputum, runny nose, coughing fits with whooping sound "sometimes," sinus pain above eyes Frequency: continual, intermittent Pertinent Negatives: Patient denies SOB, chest pain, coughing up blood, fever Disposition: [] 911 / [] ED /[] Urgent Care (no appt availability in office) / [x] Appointment(In office/virtual)/ []  [place] [facility]/ [] Home Care/ [] Refused Recommended Disposition /[]  Mobile Bus/ []  Follow-up with PCP Additional Notes: Pt reporting he's had a cough on and off for "3 weeks or more now," but worsening for the "last week." Pt reporting cough is "real bad" today, "start coughing and coughing for about 30 min or more, constant off and on," coughing up "yellow" sputum, pt confirms he has a whooping sound (nurse mimicked for pt) "sometimes it be like that" after coughing. Pt confirms no SOB, hemoptysis, or chest pain. Pt also reporting runny nose and pain "right above eyes." Advised pt be examined today, scheduled with PCP for today, confirmed location/appt info. Pt verbalized understanding.  Reason for Disposition  SEVERE coughing spells (e.g., whooping sound after coughing, vomiting after coughing)  Answer Assessment - Initial Assessment Questions 1. ONSET: "When did the cough begin?"      Last week, 3 weeks or more now 2. SEVERITY: "How bad is the cough today?"      Real bad, start coughing and coughing for about 30 min or more, constant off and on 3. SPUTUM: "Describe the color of your sputum" (none, dry cough; clear, white, yellow, green)     yellow 4. HEMOPTYSIS: "Are you coughing up any blood?" If  so ask: "How much?" (flecks, streaks, tablespoons, etc.)     denies 5. DIFFICULTY BREATHING: "Are you having difficulty breathing?" If Yes, ask: "How bad is it?" (e.g., mild, moderate, severe)    - MILD: No SOB at rest, mild SOB with walking, speaks normally in sentences, can lie down, no retractions, pulse < 100.    - MODERATE: SOB at rest, SOB with minimal exertion and prefers to sit, cannot lie down flat, speaks in phrases, mild retractions, audible wheezing, pulse 100-120.    - SEVERE: Very SOB at rest, speaks in single words, struggling to breathe, sitting hunched forward, retractions, pulse > 120      denies 6. FEVER: "Do you have a fever?" If Yes, ask: "What is your temperature, how was it measured, and when did it start?"     denies 7. CARDIAC HISTORY: "Do you have any history of heart disease?" (e.g., heart attack, congestive heart failure)      denies 8. LUNG HISTORY: "Do you have any history of lung disease?"  (e.g., pulmonary embolus, asthma, emphysema)     denies 9. PE RISK FACTORS: "Do you have a history of blood clots?" (or: recent major surgery, recent prolonged travel, bedridden)     denies 10. OTHER SYMPTOMS: "Do you have any other symptoms?" (e.g., runny nose, wheezing, chest pain)       Runny nose, pain right above eyes  Protocols used: Cough - Acute Productive-A-AH

## 2023-07-05 NOTE — Progress Notes (Signed)
Acute Office Visit  Subjective:    Patient ID: Adam [name], male    DOB: 1950/07/19, 73 y.o.   MRN: [id number]  Chief Complaint  Patient presents with   Cough    Pt reports cough , congestion , headache, and sinus pressure ongoing for 2 weeks. Has used robitussin otc.     HPI Patient is in today for nasal congestion, sinus pressure related headache and periorbital pain, and cough with yellowish expectoration for the last 2 weeks.  He has tried Robitussin without much relief.  Denies any fever or chills.  Denies any dyspnea or wheezing currently.  Past Medical History:  Diagnosis Date   Allergy    BMI 31.0-31.9,adult JUNE 2011 180 LBS    Chronic back pain    Depression    Eosinophilic granuloma (HCC) JAN 2011 GASTRIC, followed by Dr. [name]   EGD JAN 2011, JUN 2011, NOV 2011   Gastric ulcer 08/31/2012   GERD (gastroesophageal reflux disease)    HTN (hypertension)    Hyperlipemia    IBS (irritable bowel syndrome)    Prostate hypertrophy     Past Surgical History:  Procedure Laterality Date   BACK SURGERY     COLONOSCOPY  JAN 2011 COMPLETE   hypoTN and bradycardia w/ Demerol ,Phenergan and Versed, Lyndon TICS, SML IH   COLONOSCOPY  2003   NUR-normal   COLONOSCOPY  06/21/09   Fields-sigmoid diverticulosis, sm int hemorrhoids   COLONOSCOPY N/A 10/24/2013   Normal mucosa in the terminal ileum Moderate diverticulosis noted in the sigmoid colonModerate sized internal hemorrhoids. negative random colon bx.. next TCS 10/2023   Cyst Removed     from Left leg and knee   ESOPHAGOGASTRODUODENOSCOPY  05/20/2012   SLF: MILD ESOPHAGITIS.NO BARRETT'S/Multiple ulcers ranging between 3-5 mm/MILD Duodenal inflammation was found in the duodenal bulb   ESOPHAGOGASTRODUODENOSCOPY N/A 09/05/2012   SLF: MILD Non-erosive gastritis (inflammation) in the gastric antrum. Biopsies benign, no H. pylori.   ESOPHAGOGASTRODUODENOSCOPY N/A 10/24/2013   NWG:NFAOZHYQ DISTAL ESOPHAGEAL WEBSmall hiatal  herniaMILD Non-erosive gastritis. SB bx benign. gastric bx, minimal inflammation.    GANGLION CYST EXCISION     from L wrist and pins placed for Fx involving L thumb   HAND SURGERY  at the age of 3   Brother accidentally cut it   MALONEY DILATION N/A 10/24/2013   Procedure: Elease Hashimoto DILATION;  Surgeon: [name], MD;  Location: [facility];  Service: Endoscopy;  Laterality: N/A;   MASS EXCISION  04/29/2012   Procedure: EXCISION MASS;  Surgeon: [name], MD;  Location: [facility];  Service: General;  Laterality: Right;  Excision of Vascular Mass Right Arm   SAVORY DILATION N/A 10/24/2013   Procedure: SAVORY DILATION;  Surgeon: [name], MD;  Location: [facility];  Service: Endoscopy;  Laterality: N/A;   SHOULDER SURGERY Left 2017   TUMOR REMOVAL  1994   Benign from brain at [facility]   UPPER GASTROINTESTINAL ENDOSCOPY  JAN 2011   GASTRIC ULCER-EOS GRANULOMA   UPPER GASTROINTESTINAL ENDOSCOPY  JUN 2011   GASTRIC ULCER- EOS, NO H. PYLORI   UPPER GASTROINTESTINAL ENDOSCOPY  NOV 2011   GASTRIC NODULE, no ulcer- EOS   XI ROBOTIC ASSISTED INGUINAL HERNIA REPAIR WITH MESH Right 09/25/2022   Procedure: XI ROBOTIC ASSISTED INGUINAL HERNIA REPAIR WITH MESH;  Surgeon: [name], DO;  Location: [facility];  Service: General;  Laterality: Right;    Family History  Problem Relation Age of  Onset   Hypertension Mother    Diabetes Mother    Hyperlipidemia Mother    Thyroid disease Mother    Hypertension Father    Diabetes Father    Stroke Father    Colon cancer Maternal Grandmother        OVER 67    Social History   Socioeconomic History   Marital status: Married    Spouse name: [name]   Number of children: 1   Years of education: 12   Highest education level: Not on file  Occupational History   Occupation: disabled  Tobacco Use   Smoking status: Former    Current packs/day: 0.00    Average packs/day: 0.3 packs/day for 15.0 years (3.8 ttl pk-yrs)     Types: Cigars, Cigarettes    Start date: 04/30/1987    Quit date: 04/29/2002    Years since quitting: 21.1   Smokeless tobacco: Never  Vaping Use   Vaping status: Never Used  Substance and Sexual Activity   Alcohol use: Not Currently   Drug use: No   Sexual activity: Yes    Birth control/protection: None  Other Topics Concern   Not on file  Social History Narrative   Lives w/ wife   Caffeine use: 1 cup coffee every morning   Social Drivers of Health   Financial Resource Strain: Low Risk  (06/21/2023)   Overall Financial Resource Strain (CARDIA)    Difficulty of Paying Living Expenses: Not hard at all  Food Insecurity: No Food Insecurity (06/21/2023)   Hunger Vital Sign    Worried About Running Out of Food in the Last Year: Never true    Ran Out of Food in the Last Year: Never true  Transportation Needs: No Transportation Needs (06/21/2023)   PRAPARE - Administrator, Civil Service (Medical): No    Lack of Transportation (Non-Medical): No  Physical Activity: Sufficiently Active (06/21/2023)   Exercise Vital Sign    Days of Exercise per Week: 7 days    Minutes of Exercise per Session: 30 min  Stress: No Stress Concern Present (06/21/2023)   [employer] of Occupational Health - Occupational Stress Questionnaire    Feeling of Stress : Not at all  Social Connections: Moderately Integrated (06/21/2023)   Social Connection and Isolation Panel [NHANES]    Frequency of Communication with Friends and Family: More than three times a week    Frequency of Social Gatherings with Friends and Family: More than three times a week    Attends Religious Services: More than 4 times per year    Active Member of [employer] or Organizations: No    Attends Banker Meetings: Never    Marital Status: Married  Catering manager Violence: Not At Risk (06/21/2023)   Humiliation, Afraid, Rape, and Kick questionnaire    Fear of Current or Ex-Partner: No    Emotionally Abused: No     Physically Abused: No    Sexually Abused: No    Outpatient Medications Prior to Visit  Medication Sig Dispense Refill   docusate sodium (COLACE) 100 MG capsule Take 1 capsule (100 mg total) by mouth 2 (two) times daily. 60 capsule 2   doxazosin (CARDURA) 8 MG tablet TAKE 1 TABLET AT BEDTIME 90 tablet 3   hydrALAZINE (APRESOLINE) 25 MG tablet TAKE 1 TABLET TWICE DAILY 180 tablet 3   hydrocortisone (ANUSOL-HC) 25 MG suppository Place 1 suppository (25 mg total) rectally 2 (two) times daily as needed for hemorrhoids or anal itching.  12 suppository 0   loratadine (CLARITIN) 10 MG tablet Take 10 mg by mouth daily as needed for allergies.      pantoprazole (PROTONIX) 40 MG tablet TAKE 1 TABLET TWICE DAILY 180 tablet 3   PARoxetine (PAXIL) 20 MG tablet TAKE 1 TABLET EVERY DAY 90 tablet 3   pravastatin (PRAVACHOL) 20 MG tablet TAKE 1 TABLET AT BEDTIME (NEED MD APPOINTMENT) 90 tablet 3   valsartan-hydrochlorothiazide (DIOVAN-HCT) 320-25 MG tablet Take 1 tablet by mouth daily. 90 tablet 3   verapamil (CALAN-SR) 180 MG CR tablet TAKE 1 TABLET TWICE DAILY 180 tablet 3   benzonatate (TESSALON) 200 MG capsule Take 1 capsule (200 mg total) by mouth 2 (two) times daily as needed for cough. 20 capsule 0   No facility-administered medications prior to visit.    Allergies  Allergen Reactions   Sulfonamide Derivatives     REACTION: causes hives    Review of Systems  Constitutional:  Negative for chills and fever.  HENT:  Positive for congestion, postnasal drip, sinus pressure, sinus pain and sore throat.   Eyes:  Negative for discharge and redness.  Respiratory:  Positive for cough. Negative for shortness of breath.   Cardiovascular:  Negative for chest pain and palpitations.  Gastrointestinal:  Positive for constipation. Negative for diarrhea, nausea and vomiting.  Endocrine: Negative for polydipsia and polyuria.  Genitourinary:  Negative for dysuria and hematuria.  Musculoskeletal:  Positive for  arthralgias. Negative for neck pain and neck stiffness.  Skin:  Positive for rash.  Neurological:  Negative for dizziness, weakness, numbness and headaches.  Psychiatric/Behavioral:  Negative for agitation and behavioral problems.        Objective:    Physical Exam Vitals reviewed.  Constitutional:      General: He is not in acute distress.    Appearance: He is not diaphoretic.  HENT:     Head: Normocephalic and atraumatic.     Nose: Congestion present.     Right Sinus: Frontal sinus tenderness present.     Left Sinus: Frontal sinus tenderness present.     Mouth/Throat:     Mouth: Mucous membranes are moist.  Eyes:     General: No scleral icterus.    Extraocular Movements: Extraocular movements intact.  Cardiovascular:     Rate and Rhythm: Normal rate and regular rhythm.     Pulses: Normal pulses.     Heart sounds: Murmur heard.  Pulmonary:     Breath sounds: Normal breath sounds. No wheezing or rales.  Abdominal:     Palpations: Abdomen is soft.     Tenderness: There is no abdominal tenderness.  Musculoskeletal:     Cervical back: Neck supple. No tenderness.     Right lower leg: No edema.     Left lower leg: No edema.  Skin:    General: Skin is warm.     Findings: No rash.     Comments: S/p excision of mole over scalp  Neurological:     General: No focal deficit present.     Mental Status: He is alert and oriented to person, place, and time.  Psychiatric:        Mood and Affect: Mood normal.        Behavior: Behavior normal.     BP 134/75   Pulse 68   Ht 5\' 4"  (1.626 m)   Wt 173 lb 12.8 oz (78.8 kg)   SpO2 95%   BMI 29.83 kg/m  Wt Readings from Last 3 Encounters:  07/05/23 173 lb 12.8 oz (78.8 kg)  06/21/23 163 lb (73.9 kg)  05/25/23 167 lb 12.8 oz (76.1 kg)        Assessment & Plan:   Problem List Items Addressed This Visit       Respiratory   Acute non-recurrent ethmoidal sinusitis - Primary   Likely has acute ethmoidal and frontal  sinusitis Considering her persistent symptoms despite symptomatic treatment, started Augmentin Guaifenesin-codeine syrup as needed for cough Flonase for nasal congestion Advised to use vaporizer for nasal congestion      Relevant Medications   amoxicillin-clavulanate (AUGMENTIN) 875-125 MG tablet   guaiFENesin-codeine 100-10 MG/5ML syrup   fluticasone (FLONASE) 50 MCG/ACT nasal spray     Meds ordered this encounter  Medications   amoxicillin-clavulanate (AUGMENTIN) 875-125 MG tablet    Sig: Take 1 tablet by mouth 2 (two) times daily.    Dispense:  14 tablet    Refill:  0   guaiFENesin-codeine 100-10 MG/5ML syrup    Sig: Take 5 mLs by mouth 3 (three) times daily as needed for cough.    Dispense:  120 mL    Refill:  0   fluticasone (FLONASE) 50 MCG/ACT nasal spray    Sig: Place 2 sprays into both nostrils daily.    Dispense:  16 g    Refill:  1     Barbarajean Kinzler Concha Se, MD

## 2023-07-05 NOTE — Assessment & Plan Note (Addendum)
Likely has acute ethmoidal and frontal sinusitis Considering her persistent symptoms despite symptomatic treatment, started Augmentin Guaifenesin-codeine syrup as needed for cough Flonase for nasal congestion Advised to use vaporizer for nasal congestion

## 2023-07-05 NOTE — Patient Instructions (Addendum)
Please start taking Augmentin as prescribed.  Please use Flonase for nasal congestion.  Please take Guaifenesin-codeine syrup as needed for cough - may cause sedation/sleepiness.

## 2023-07-14 ENCOUNTER — Other Ambulatory Visit: Payer: Self-pay

## 2023-07-14 ENCOUNTER — Ambulatory Visit: Payer: Self-pay | Admitting: Internal Medicine

## 2023-07-14 DIAGNOSIS — J069 Acute upper respiratory infection, unspecified: Secondary | ICD-10-CM

## 2023-07-14 MED ORDER — GUAIFENESIN-CODEINE 100-10 MG/5ML PO SOLN: 5.0000 mL | Freq: Three times a day (TID) | ORAL | 0 refills | Status: DC | PRN

## 2023-07-14 NOTE — Telephone Encounter (Signed)
Pt states he will come in to do a swab in the office.

## 2023-07-14 NOTE — Telephone Encounter (Signed)
  Chief Complaint: sinusitis completed antibiotic, still symptomatic Symptoms: productive cough, chills, sinus/head pain, breaking out in sweats Frequency: x 1 month for cough and head pain, chills started today. Pertinent Negatives: Patient denies SOB. Disposition: [] ED /[] Urgent Care (no appt availability in office) / [x] Appointment(In office/virtual)/ []  [facility]/ [] Home Care/ [x] Refused Recommended Disposition /[] [facility]/ []  Follow-up with PCP Additional Notes: Patient seen by PCP on 07/05/23 for sinusitis and placed on Augmentin  x 7 days, cough syrup and flonase  nasal spray. Patient states he improved a little bit but not much. Patient completed antibiotic and has ran out of his cough syrup. No available acute appointments at PCP office until March. Wife declined offer for acute appointment at other office or recommendation to go to urgent care. She requests a call back with advice from Dr Tobie.    Copied From CRM [id number]. Reason for Triage: patient spouse called in stating the patient had been to the providers office on the 27th was given nasal spray, cough meds, and antibiotics. Everything has come back and its worse [phone]   Reason for Disposition  [1] Taking antibiotic > 72 hours (3 days) AND [2] sinus pain not improved  Answer Assessment - Initial Assessment Questions 1. ANTIBIOTIC: What antibiotic are you taking? How many times a day?     Augmentin  twice a day.  2. ONSET: When was the antibiotic started?     07/05/23.  3. PAIN: How bad is the sinus pain?   (Scale 1-10; mild, moderate or severe)   - MILD (1-3): doesn't interfere with normal activities    - MODERATE (4-7): interferes with normal activities (e.g., work or school) or awakens from sleep   - SEVERE (8-10): excruciating pain and patient unable to do any normal activities        8/10. Patient took Tylenol  at 0800 this morning.  4. FEVER: Do you have a fever? If Yes, ask:  What is it, how was it measured, and when did it start?      Breaking out in sweats, unsure if fevers.  5. SYMPTOMS: Are there any other symptoms you're concerned about? If Yes, ask: When did it start?     Productive cough with white mucus x about a month.  Protocols used: Sinus Infection on Antibiotic Follow-up Call-[facility]

## 2023-07-14 NOTE — Telephone Encounter (Signed)
 ASA 3. Ok for room 1/2.

## 2023-07-15 DIAGNOSIS — J069 Acute upper respiratory infection, unspecified: Secondary | ICD-10-CM | POA: Diagnosis not present

## 2023-07-15 NOTE — Telephone Encounter (Signed)
 Attempted to contact pt, numerous ring, no answer nor vm

## 2023-07-17 LAB — COVID-19, FLU A+B AND RSV
Influenza A, NAA: NOT DETECTED
Influenza B, NAA: NOT DETECTED
RSV, NAA: NOT DETECTED
SARS-CoV-2, NAA: NOT DETECTED

## 2023-07-19 ENCOUNTER — Encounter: Payer: Self-pay | Admitting: Internal Medicine

## 2023-07-22 ENCOUNTER — Encounter: Payer: Self-pay | Admitting: *Deleted

## 2023-07-22 ENCOUNTER — Other Ambulatory Visit: Payer: Self-pay | Admitting: *Deleted

## 2023-07-22 MED ORDER — PEG 3350-KCL-NA BICARB-NACL 420 G PO SOLR: 4000.0000 mL | Freq: Once | ORAL | 0 refills | Status: AC

## 2023-07-22 NOTE — Telephone Encounter (Signed)
Referral completed, TCS apt letter sent to PCP

## 2023-07-22 NOTE — Telephone Encounter (Signed)
[employer] PA:  Approved Authorization #[id number]  Tracking #[id number] Dates of service 08/23/2023 - 11/22/2023

## 2023-07-22 NOTE — Telephone Encounter (Signed)
Left message with family member to have pt to return call.

## 2023-07-22 NOTE — Telephone Encounter (Signed)
Pt has been scheduled for 08/23/23 with Dr.Carver. instructions mailed and prep sent to the pharmacy

## 2023-07-27 ENCOUNTER — Other Ambulatory Visit: Payer: Self-pay | Admitting: Internal Medicine

## 2023-07-27 DIAGNOSIS — J0120 Acute ethmoidal sinusitis, unspecified: Secondary | ICD-10-CM

## 2023-08-15 ENCOUNTER — Other Ambulatory Visit: Payer: Self-pay | Admitting: Internal Medicine

## 2023-08-15 DIAGNOSIS — I10 Essential (primary) hypertension: Secondary | ICD-10-CM

## 2023-08-16 ENCOUNTER — Other Ambulatory Visit: Payer: Self-pay | Admitting: Internal Medicine

## 2023-08-16 DIAGNOSIS — I10 Essential (primary) hypertension: Secondary | ICD-10-CM

## 2023-08-16 MED ORDER — VERAPAMIL HCL ER 180 MG PO TBCR: 180.0000 mg | EXTENDED_RELEASE_TABLET | Freq: Two times a day (BID) | ORAL | 3 refills | Status: DC

## 2023-08-16 NOTE — Telephone Encounter (Signed)
 Copied from CRM [id number]. Topic: Clinical - Medication Refill >> Aug 16, 2023  8:42 AM Geroge Baseman wrote: Most Recent Primary Care Visit:  Provider: Anabel Halon  Department: [facility]  Visit Type: ACUTE  Date: 07/05/2023  Medication: verapamil (CALAN-SR) 180 MG CR tablet  Has the patient contacted their pharmacy? Yes  Is this the correct pharmacy for this prescription? Yes If no, delete pharmacy and type the correct one.  This is the patient's preferred pharmacy:   [id number] - [place], [place] - [address] AT [facility] [address] [place] [address] Phone: [phone] Fax: [phone]    Has the prescription been filled recently? Yes  Is the patient out of the medication? Yes  Has the patient been seen for an appointment in the last year OR does the patient have an upcoming appointment? Yes  Can we respond through MyChart? Either  Agent: Please be advised that Rx refills may take up to 3 business days. We ask that you follow-up with your pharmacy.

## 2023-08-17 NOTE — Patient Instructions (Signed)
 Adam [name]  08/17/2023     @PREFPERIOPPHARMACY @   Your procedure is scheduled on  08/23/2023.   Report to [facility] at  0730  A.M.   Call this number if you have problems the morning of surgery:  [phone]  If you experience any cold or flu symptoms such as cough, fever, chills, shortness of breath, etc. between now and your scheduled surgery, please notify us at the above number.   Remember:  Follow the diet and prep instructions given to you by the office.   You may drink clear liquids until 0530 am on 08/23/2023.    Clear liquids allowed are:                    Water, Juice (No red color; non-citric and without pulp; diabetics please choose diet or no sugar options), Carbonated beverages (diabetics please choose diet or no sugar options), Clear Tea (No creamer, milk, or cream, including half & half and powdered creamer), Black Coffee Only (No creamer, milk or cream, including half & half and powdered creamer), Plain Jell-O Only (No red color; diabetics please choose no sugar options), Clear Sports drink (No red color; diabetics please choose diet or no sugar options), and Plain Popsicles Only (No red color; diabetics please choose no sugar options)    Take these medicines the morning of surgery with A SIP OF WATER                                pantoprazole, paroxetine.    Do not wear jewelry, make-up or nail polish, including gel polish,  artificial nails, or any other type of covering on natural nails (fingers and  toes).  Do not wear lotions, powders, or perfumes, or deodorant.  Do not shave 48 hours prior to surgery.  Men may shave face and neck.  Do not bring valuables to the hospital.  [facility] is not responsible for any belongings or valuables.  Contacts, dentures or bridgework may not be worn into surgery.  Leave your suitcase in the car.  After surgery it may be brought to your room.  For patients admitted to the hospital, discharge time will  be determined by your treatment team.  Patients discharged the day of surgery will not be allowed to drive home and must have someone with them for 24 hours.    Special instructions:   DO NOT smoke tobacco or vape for 24 hours before your procedure.  Please read over the following fact sheets that you were given. Anesthesia Post-op Instructions and Care and Recovery After Surgery      Colonoscopy, Adult, Care After The following information offers guidance on how to care for yourself after your procedure. Your health care provider may also give you more specific instructions. If you have problems or questions, contact your health care provider. What can I expect after the procedure? After the procedure, it is common to have: A small amount of blood in your stool for 24 hours after the procedure. Some gas. Mild cramping or bloating of your abdomen. Follow these instructions at home: Eating and drinking  Drink enough fluid to keep your urine pale yellow. Follow instructions from your health care provider about eating or drinking restrictions. Resume your normal diet as told by your health care provider. Avoid heavy or fried foods that are hard to digest. Activity Rest as told  by your health care provider. Avoid sitting for a long time without moving. Get up to take short walks every 1-2 hours. This is important to improve blood flow and breathing. Ask for help if you feel weak or unsteady. Return to your normal activities as told by your health care provider. Ask your health care provider what activities are safe for you. Managing cramping and bloating  Try walking around when you have cramps or feel bloated. If directed, apply heat to your abdomen as told by your health care provider. Use the heat source that your health care provider recommends, such as a moist heat pack or a heating pad. Place a towel between your skin and the heat source. Leave the heat on for 20-30  minutes. Remove the heat if your skin turns bright red. This is especially important if you are unable to feel pain, heat, or cold. You have a greater risk of getting burned. General instructions If you were given a sedative during the procedure, it can affect you for several hours. Do not drive or operate machinery until your health care provider says that it is safe. For the first 24 hours after the procedure: Do not sign important documents. Do not drink alcohol. Do your regular daily activities at a slower pace than normal. Eat soft foods that are easy to digest. Take over-the-counter and prescription medicines only as told by your health care provider. Keep all follow-up visits. This is important. Contact a health care provider if: You have blood in your stool 2-3 days after the procedure. Get help right away if: You have more than a small spotting of blood in your stool. You have large blood clots in your stool. You have swelling of your abdomen. You have nausea or vomiting. You have a fever. You have increasing pain in your abdomen that is not relieved with medicine. These symptoms may be an emergency. Get help right away. Call 911. Do not wait to see if the symptoms will go away. Do not drive yourself to the hospital. Summary After the procedure, it is common to have a small amount of blood in your stool. You may also have mild cramping and bloating of your abdomen. If you were given a sedative during the procedure, it can affect you for several hours. Do not drive or operate machinery until your health care provider says that it is safe. Get help right away if you have a lot of blood in your stool, nausea or vomiting, a fever, or increased pain in your abdomen. This information is not intended to replace advice given to you by your health care provider. Make sure you discuss any questions you have with your health care provider. Document Revised: 07/07/2022 Document Reviewed:  01/15/2021 Elsevier Patient Education  2024 Elsevier Inc.General Anesthesia, Adult, Care After The following information offers guidance on how to care for yourself after your procedure. Your health care provider may also give you more specific instructions. If you have problems or questions, contact your health care provider. What can I expect after the procedure? After the procedure, it is common for people to: Have pain or discomfort at the IV site. Have nausea or vomiting. Have a sore throat or hoarseness. Have trouble concentrating. Feel cold or chills. Feel weak, sleepy, or tired (fatigue). Have soreness and body aches. These can affect parts of the body that were not involved in surgery. Follow these instructions at home: For the time period you were told by your health care  provider:  Rest. Do not participate in activities where you could fall or become injured. Do not drive or use machinery. Do not drink alcohol. Do not take sleeping pills or medicines that cause drowsiness. Do not make important decisions or sign legal documents. Do not take care of children on your own. General instructions Drink enough fluid to keep your urine pale yellow. If you have sleep apnea, surgery and certain medicines can increase your risk for breathing problems. Follow instructions from your health care provider about wearing your sleep device: Anytime you are sleeping, including during daytime naps. While taking prescription pain medicines, sleeping medicines, or medicines that make you drowsy. Return to your normal activities as told by your health care provider. Ask your health care provider what activities are safe for you. Take over-the-counter and prescription medicines only as told by your health care provider. Do not use any products that contain nicotine or tobacco. These products include cigarettes, chewing tobacco, and vaping devices, such as e-cigarettes. These can delay incision  healing after surgery. If you need help quitting, ask your health care provider. Contact a health care provider if: You have nausea or vomiting that does not get better with medicine. You vomit every time you eat or drink. You have pain that does not get better with medicine. You cannot urinate or have bloody urine. You develop a skin rash. You have a fever. Get help right away if: You have trouble breathing. You have chest pain. You vomit blood. These symptoms may be an emergency. Get help right away. Call 911. Do not wait to see if the symptoms will go away. Do not drive yourself to the hospital. Summary After the procedure, it is common to have a sore throat, hoarseness, nausea, vomiting, or to feel weak, sleepy, or fatigue. For the time period you were told by your health care provider, do not drive or use machinery. Get help right away if you have difficulty breathing, have chest pain, or vomit blood. These symptoms may be an emergency. This information is not intended to replace advice given to you by your health care provider. Make sure you discuss any questions you have with your health care provider. Document Revised: 08/22/2021 Document Reviewed: 08/22/2021 Elsevier Patient Education  2024 [employer].

## 2023-08-18 ENCOUNTER — Encounter (HOSPITAL_COMMUNITY): Payer: Self-pay

## 2023-08-18 ENCOUNTER — Encounter (HOSPITAL_COMMUNITY)
Admission: RE | Admit: 2023-08-18 | Discharge: 2023-08-18 | Disposition: A | Discharge status: Home / Self Care | Payer: Medicare HMO | Source: Ambulatory Visit | Attending: Internal Medicine | Admitting: Internal Medicine

## 2023-08-18 VITALS — BP 146/63 | HR 61 | Temp 98.0°F | Resp 18 | Ht 64.0 in | Wt 173.7 lb

## 2023-08-18 DIAGNOSIS — Z79899 Other long term (current) drug therapy: Secondary | ICD-10-CM | POA: Insufficient documentation

## 2023-08-18 DIAGNOSIS — Z01818 Encounter for other preprocedural examination: Secondary | ICD-10-CM | POA: Insufficient documentation

## 2023-08-18 DIAGNOSIS — I10 Essential (primary) hypertension: Secondary | ICD-10-CM | POA: Insufficient documentation

## 2023-08-18 LAB — BASIC METABOLIC PANEL
Anion gap: 11 (ref 5–15)
BUN: 27 mg/dL — ABNORMAL HIGH (ref 8–23)
CO2: 24 mmol/L (ref 22–32)
Calcium: 9.1 mg/dL (ref 8.9–10.3)
Chloride: 103 mmol/L (ref 98–111)
Creatinine, Ser: 1.25 mg/dL — ABNORMAL HIGH (ref 0.61–1.24)
GFR, Estimated: >60 mL/min (ref >60)
Glucose, Bld: 88 mg/dL (ref 70–99)
Potassium: 3.2 mmol/L — ABNORMAL LOW (ref 3.5–5.1)
Sodium: 138 mmol/L (ref 135–145)

## 2023-08-23 ENCOUNTER — Encounter (HOSPITAL_COMMUNITY)
Admission: RE | Disposition: A | Discharge status: Home / Self Care | Payer: Self-pay | Source: Home / Self Care | Attending: Internal Medicine

## 2023-08-23 ENCOUNTER — Ambulatory Visit (HOSPITAL_COMMUNITY): Admitting: Anesthesiology

## 2023-08-23 ENCOUNTER — Encounter (HOSPITAL_COMMUNITY): Payer: Self-pay | Admitting: Internal Medicine

## 2023-08-23 ENCOUNTER — Ambulatory Visit (HOSPITAL_COMMUNITY)
Admission: RE | Admit: 2023-08-23 | Discharge: 2023-08-23 | Disposition: A | Discharge status: Home / Self Care | Payer: Medicare HMO | Attending: Internal Medicine | Admitting: Internal Medicine

## 2023-08-23 DIAGNOSIS — Z8711 Personal history of peptic ulcer disease: Secondary | ICD-10-CM | POA: Insufficient documentation

## 2023-08-23 DIAGNOSIS — K648 Other hemorrhoids: Secondary | ICD-10-CM | POA: Diagnosis not present

## 2023-08-23 DIAGNOSIS — Z79899 Other long term (current) drug therapy: Secondary | ICD-10-CM | POA: Insufficient documentation

## 2023-08-23 DIAGNOSIS — Z87891 Personal history of nicotine dependence: Secondary | ICD-10-CM | POA: Diagnosis not present

## 2023-08-23 DIAGNOSIS — I10 Essential (primary) hypertension: Secondary | ICD-10-CM

## 2023-08-23 DIAGNOSIS — F32A Depression, unspecified: Secondary | ICD-10-CM | POA: Insufficient documentation

## 2023-08-23 DIAGNOSIS — Z800 Family history of malignant neoplasm of digestive organs: Secondary | ICD-10-CM | POA: Insufficient documentation

## 2023-08-23 DIAGNOSIS — D175 Benign lipomatous neoplasm of intra-abdominal organs: Secondary | ICD-10-CM | POA: Diagnosis not present

## 2023-08-23 DIAGNOSIS — K219 Gastro-esophageal reflux disease without esophagitis: Secondary | ICD-10-CM | POA: Diagnosis not present

## 2023-08-23 DIAGNOSIS — Z1211 Encounter for screening for malignant neoplasm of colon: Secondary | ICD-10-CM

## 2023-08-23 DIAGNOSIS — I350 Nonrheumatic aortic (valve) stenosis: Secondary | ICD-10-CM | POA: Diagnosis not present

## 2023-08-23 DIAGNOSIS — K5730 Diverticulosis of large intestine without perforation or abscess without bleeding: Secondary | ICD-10-CM

## 2023-08-23 HISTORY — PX: COLONOSCOPY WITH PROPOFOL: SHX5780

## 2023-08-23 SURGERY — COLONOSCOPY WITH PROPOFOL
Anesthesia: General

## 2023-08-23 MED ORDER — LIDOCAINE HCL (PF) 2 % IJ SOLN
INTRAMUSCULAR | Status: DC | PRN
  Administered 2023-08-23: 60 mg via INTRADERMAL

## 2023-08-23 MED ORDER — STERILE WATER FOR IRRIGATION IR SOLN
Status: DC | PRN
  Administered 2023-08-23: 100 mL

## 2023-08-23 MED ORDER — LACTATED RINGERS IV SOLN: INTRAVENOUS | Status: DC | PRN

## 2023-08-23 MED ORDER — LIDOCAINE HCL (PF) 2 % IJ SOLN
INTRAMUSCULAR | Status: AC
  Filled 2023-08-23: qty 5

## 2023-08-23 MED ORDER — PROPOFOL 500 MG/50ML IV EMUL
INTRAVENOUS | Status: DC | PRN
  Administered 2023-08-23: 150 ug/kg/min via INTRAVENOUS

## 2023-08-23 MED ORDER — SODIUM CHLORIDE 0.9% FLUSH: 3.0000 mL | INTRAVENOUS | Status: DC | PRN

## 2023-08-23 MED ORDER — SODIUM CHLORIDE 0.9% FLUSH: 3.0000 mL | Freq: Two times a day (BID) | INTRAVENOUS | Status: DC

## 2023-08-23 MED ORDER — PROPOFOL 10 MG/ML IV BOLUS
INTRAVENOUS | Status: DC | PRN
Start: 2023-08-23 — End: 2023-08-23
  Administered 2023-08-23: 30 mg via INTRAVENOUS
  Administered 2023-08-23: 80 mg via INTRAVENOUS

## 2023-08-23 NOTE — Op Note (Signed)
 [facility] Patient Name: Adam [name] Procedure Date: 08/23/2023 8:22 AM MRN: [id number] Date of Birth: 11/29/1950 Attending MD: Hennie Duos. Marletta Lor , [place], [id number] CSN: [id number] Age: 73 Admit Type: Outpatient Procedure:                Colonoscopy Indications:              Screening for colorectal malignant neoplasm Providers:                Hennie Duos. Marletta Lor, DO, Buel Ream. Thomasena Edis RN, RN,                            Lennice Sites Technician, Technician Referring MD:              Medicines:                See the Anesthesia note for documentation of the                            administered medications Complications:            No immediate complications. Estimated Blood Loss:     Estimated blood loss: none. Procedure:                Pre-Anesthesia Assessment:                           - The anesthesia plan was to use monitored                            anesthesia care (MAC).                           After obtaining informed consent, the colonoscope                            was passed under direct vision. Throughout the                            procedure, the patient's blood pressure, pulse, and                            oxygen saturations were monitored continuously. The                            [id number]-HQ190L ([id number]) scope was introduced through                            the anus and advanced to the the terminal ileum,                            with identification of the appendiceal orifice and                            IC valve. The colonoscopy was performed without                            difficulty. The  patient tolerated the procedure                            well. The quality of the bowel preparation was                            evaluated using the BBPS [facility] Bowel Preparation                            Scale) with scores of: Right Colon = 3, Transverse                            Colon = 3 and Left Colon = 3 (entire mucosa seen                             well with no residual staining, small fragments of                            stool or opaque liquid). The total BBPS score                            equals 9. Scope In: 8:41:46 AM Scope Out: 8:57:50 AM Scope Withdrawal Time: 0 hours 12 minutes 29 seconds  Total Procedure Duration: 0 hours 16 minutes 4 seconds  Findings:      Non-bleeding internal hemorrhoids were found during endoscopy.      Multiple large-mouthed and small-mouthed diverticula were found in the       sigmoid colon and descending colon.      There was a medium-sized lipoma, in the sigmoid colon.      The terminal ileum appeared normal.      The exam was otherwise without abnormality. Impression:               - Non-bleeding internal hemorrhoids.                           - Diverticulosis in the sigmoid colon and in the                            descending colon.                           - Medium-sized lipoma in the sigmoid colon.                           - The examined portion of the ileum was normal.                           - The examination was otherwise normal.                           - No specimens collected. Moderate Sedation:      Per Anesthesia Care Recommendation:           - Patient has a contact number available for  emergencies. The signs and symptoms of potential                            delayed complications were discussed with the                            patient. Return to normal activities tomorrow.                            Written discharge instructions were provided to the                            patient.                           - Resume previous diet.                           - Continue present medications.                           - No repeat colonoscopy due to age.                           - Return to GI clinic PRN. Procedure Code(s):        --- Professional ---                           B1478, Colorectal cancer screening; colonoscopy on                             individual not meeting criteria for high risk Diagnosis Code(s):        --- Professional ---                           Z12.11, Encounter for screening for malignant                            neoplasm of colon                           K64.8, Other hemorrhoids                           D17.5, Benign lipomatous neoplasm of                            intra-abdominal organs                           K57.30, Diverticulosis of large intestine without                            perforation or abscess without bleeding CPT copyright 2022 American Medical Association. All rights reserved. The codes documented in this report are preliminary and upon coder review may  be revised to meet current compliance requirements. Hennie Duos. Marletta Lor, DO  Hennie Duos. Marletta Lor, DO 08/23/2023 9:01:43 AM This report has been signed electronically. Number of Addenda: 0

## 2023-08-23 NOTE — Transfer of Care (Addendum)
 Immediate Anesthesia Transfer of Care Note  Patient: Adam [name]  Procedure(s) Performed: COLONOSCOPY WITH PROPOFOL  Patient Location: Short Stay  Anesthesia Type:General  Level of Consciousness: drowsy and patient cooperative  Airway & Oxygen Therapy: Patient Spontanous Breathing and Patient connected to nasal cannula oxygen  Post-op Assessment: Report given to RN and Post -op Vital signs reviewed and stable  Post vital signs: Reviewed and stable  Last Vitals:  Vitals Value Taken Time  BP 99/53 08/23/23   0903  Temp 36.6 08/23/23   0903  Pulse 52 08/23/23   0903  Resp 16 08/23/23   0903  SpO2 95% 08/23/23   0903    Last Pain:  Vitals:   08/23/23 0837  PainSc: 0-No pain         Complications: No notable events documented.

## 2023-08-23 NOTE — H&P (Signed)
 Primary Care Physician:  Anabel Halon, MD Primary Gastroenterologist:  Dr. Marletta Lor  Pre-Procedure History & Physical: HPI:  Adam [name] is a 73 y.o. male is here for a colonoscopy for colon cancer screening purposes.  Patient denies any family history of colorectal cancer.  No melena or hematochezia.    Past Medical History:  Diagnosis Date   Allergy    BMI 31.0-31.9,adult JUNE 2011 180 LBS    Chronic back pain    Depression    Eosinophilic granuloma (HCC) JAN 2011 GASTRIC, followed by Dr. [name]   EGD JAN 2011, JUN 2011, NOV 2011   Gastric ulcer 08/31/2012   GERD (gastroesophageal reflux disease)    HTN (hypertension)    Hyperlipemia    IBS (irritable bowel syndrome)    Prostate hypertrophy     Past Surgical History:  Procedure Laterality Date   BACK SURGERY     COLONOSCOPY  JAN 2011 COMPLETE   hypoTN and bradycardia w/ Demerol ,Phenergan and Versed, Snyder TICS, SML IH   COLONOSCOPY  2003   NUR-normal   COLONOSCOPY  06/21/09   Fields-sigmoid diverticulosis, sm int hemorrhoids   COLONOSCOPY N/A 10/24/2013   Normal mucosa in the terminal ileum Moderate diverticulosis noted in the sigmoid colonModerate sized internal hemorrhoids. negative random colon bx.. next TCS 10/2023   Cyst Removed     from Left leg and knee   ESOPHAGOGASTRODUODENOSCOPY  05/20/2012   SLF: MILD ESOPHAGITIS.NO BARRETT'S/Multiple ulcers ranging between 3-5 mm/MILD Duodenal inflammation was found in the duodenal bulb   ESOPHAGOGASTRODUODENOSCOPY N/A 09/05/2012   SLF: MILD Non-erosive gastritis (inflammation) in the gastric antrum. Biopsies benign, no H. pylori.   ESOPHAGOGASTRODUODENOSCOPY N/A 10/24/2013   NGE:[id number] DISTAL ESOPHAGEAL WEBSmall hiatal herniaMILD Non-erosive gastritis. SB bx benign. gastric bx, minimal inflammation.    GANGLION CYST EXCISION     from L wrist and pins placed for Fx involving L thumb   HAND SURGERY  at the age of 3   Brother accidentally cut it   MALONEY DILATION N/A  10/24/2013   Procedure: Elease Hashimoto DILATION;  Surgeon: [name], MD;  Location: [facility];  Service: Endoscopy;  Laterality: N/A;   MASS EXCISION  04/29/2012   Procedure: EXCISION MASS;  Surgeon: [name], MD;  Location: [facility] ORS;  Service: General;  Laterality: Right;  Excision of Vascular Mass Right Arm   SAVORY DILATION N/A 10/24/2013   Procedure: SAVORY DILATION;  Surgeon: [name], MD;  Location: [facility];  Service: Endoscopy;  Laterality: N/A;   SHOULDER SURGERY Left 2017   TUMOR REMOVAL  1994   Benign from brain at [facility]   UPPER GASTROINTESTINAL ENDOSCOPY  JAN 2011   GASTRIC ULCER-EOS GRANULOMA   UPPER GASTROINTESTINAL ENDOSCOPY  JUN 2011   GASTRIC ULCER- EOS, NO H. PYLORI   UPPER GASTROINTESTINAL ENDOSCOPY  NOV 2011   GASTRIC NODULE, no ulcer- EOS   XI ROBOTIC ASSISTED INGUINAL HERNIA REPAIR WITH MESH Right 09/25/2022   Procedure: XI ROBOTIC ASSISTED INGUINAL HERNIA REPAIR WITH MESH;  Surgeon: [name], DO;  Location: [facility];  Service: General;  Laterality: Right;    Prior to Admission medications   Medication Sig Start Date End Date Taking? Authorizing Provider  docusate sodium (COLACE) 100 MG capsule Take 1 capsule (100 mg total) by mouth 2 (two) times daily. 09/25/22 09/25/23 Yes Pappayliou, Santina Evans A, DO  doxazosin (CARDURA) 8 MG tablet TAKE 1 TABLET AT BEDTIME 08/16/23  Yes Anabel Halon, MD  fluticasone (FLONASE) 50 MCG/ACT nasal spray SPRAY 2 SPRAYS INTO EACH NOSTRIL EVERY DAY 07/27/23  Yes Anabel Halon, MD  hydrALAZINE (APRESOLINE) 25 MG tablet TAKE 1 TABLET TWICE DAILY 06/03/23  Yes Anabel Halon, MD  hydrocortisone (ANUSOL-HC) 25 MG suppository Place 1 suppository (25 mg total) rectally 2 (two) times daily as needed for hemorrhoids or anal itching. 05/25/23  Yes Anabel Halon, MD  loratadine (CLARITIN) 10 MG tablet Take 10 mg by mouth daily as needed for allergies.    Yes [provider]  pantoprazole (PROTONIX)  40 MG tablet TAKE 1 TABLET TWICE DAILY 05/17/23  Yes Anabel Halon, MD  PARoxetine (PAXIL) 20 MG tablet TAKE 1 TABLET EVERY DAY 05/17/23  Yes Anabel Halon, MD  pravastatin (PRAVACHOL) 20 MG tablet TAKE 1 TABLET AT BEDTIME (NEED MD APPOINTMENT) 05/17/23  Yes Anabel Halon, MD  valsartan-hydrochlorothiazide (DIOVAN-HCT) 320-25 MG tablet Take 1 tablet by mouth daily. 10/22/22  Yes Anabel Halon, MD  verapamil (CALAN-SR) 180 MG CR tablet Take 1 tablet (180 mg total) by mouth 2 (two) times daily. 08/16/23  Yes Anabel Halon, MD  amoxicillin-clavulanate (AUGMENTIN) 875-125 MG tablet Take 1 tablet by mouth 2 (two) times daily. 07/05/23   Anabel Halon, MD  guaiFENesin-codeine 100-10 MG/5ML syrup Take 5 mLs by mouth 3 (three) times daily as needed for cough. 07/14/23   Anabel Halon, MD    Allergies as of 07/22/2023 - Review Complete 07/05/2023  Allergen Reaction Noted   Sulfonamide derivatives      Family History  Problem Relation Age of Onset   Hypertension Mother    Diabetes Mother    Hyperlipidemia Mother    Thyroid disease Mother    Hypertension Father    Diabetes Father    Stroke Father    Colon cancer Maternal Grandmother        OVER 61    Social History   Socioeconomic History   Marital status: Married    Spouse name: [name]   Number of children: 1   Years of education: 12   Highest education level: Not on file  Occupational History   Occupation: disabled  Tobacco Use   Smoking status: Former    Current packs/day: 0.00    Average packs/day: 0.3 packs/day for 15.0 years (3.8 ttl pk-yrs)    Types: Cigars, Cigarettes    Start date: 04/30/1987    Quit date: 04/29/2002    Years since quitting: 21.3   Smokeless tobacco: Never  Vaping Use   Vaping status: Never Used  Substance and Sexual Activity   Alcohol use: Not Currently    Comment: rarely   Drug use: No   Sexual activity: Yes    Birth control/protection: None  Other Topics Concern   Not on file  Social  History Narrative   Lives w/ wife   Caffeine use: 1 cup coffee every morning   Social Drivers of Health   Financial Resource Strain: Low Risk  (06/21/2023)   Overall Financial Resource Strain (CARDIA)    Difficulty of Paying Living Expenses: Not hard at all  Food Insecurity: No Food Insecurity (06/21/2023)   Hunger Vital Sign    Worried About Running Out of Food in the Last Year: Never true    Ran Out of Food in the Last Year: Never true  Transportation Needs: No Transportation Needs (06/21/2023)   PRAPARE - Administrator, Civil Service (Medical): No    Lack of Transportation (Non-Medical):  No  Physical Activity: Sufficiently Active (06/21/2023)   Exercise Vital Sign    Days of Exercise per Week: 7 days    Minutes of Exercise per Session: 30 min  Stress: No Stress Concern Present (06/21/2023)   [employer] of Occupational Health - Occupational Stress Questionnaire    Feeling of Stress : Not at all  Social Connections: Moderately Integrated (06/21/2023)   Social Connection and Isolation Panel [NHANES]    Frequency of Communication with Friends and Family: More than three times a week    Frequency of Social Gatherings with Friends and Family: More than three times a week    Attends Religious Services: More than 4 times per year    Active Member of Clubs or Organizations: No    Attends Banker Meetings: Never    Marital Status: Married  Catering manager Violence: Not At Risk (06/21/2023)   Humiliation, Afraid, Rape, and Kick questionnaire    Fear of Current or Ex-Partner: No    Emotionally Abused: No    Physically Abused: No    Sexually Abused: No    Review of Systems: See HPI, otherwise negative ROS  Physical Exam: Vital signs in last 24 hours: Temp:  [98.9 F (37.2 C)] 98.9 F (37.2 C) (03/17 0756) Pulse Rate:  [64] 64 (03/17 0756) Resp:  [18] 18 (03/17 0756) BP: (163-186)/(83-92) 163/92 (03/17 0805) SpO2:  [99 %] 99 % (03/17 0756) Weight:   [78.8 kg] 78.8 kg (03/17 0730)   General:   Alert,  Well-developed, well-nourished, pleasant and cooperative in NAD Head:  Normocephalic and atraumatic. Eyes:  Sclera clear, no icterus.   Conjunctiva pink. Ears:  Normal auditory acuity. Nose:  No deformity, discharge,  or lesions. Msk:  Symmetrical without gross deformities. Normal posture. Extremities:  Without clubbing or edema. Neurologic:  Alert and  oriented x4;  grossly normal neurologically. Skin:  Intact without significant lesions or rashes. Psych:  Alert and cooperative. Normal mood and affect.  Impression/Plan: Adam [name] is here for a colonoscopy to be performed for colon cancer screening purposes.  The risks of the procedure including infection, bleed, or perforation as well as benefits, limitations, alternatives and imponderables have been reviewed with the patient. Questions have been answered. All parties agreeable.

## 2023-08-23 NOTE — Discharge Instructions (Addendum)
  Colonoscopy Discharge Instructions  Read the instructions outlined below and refer to this sheet in the next few weeks. These discharge instructions provide you with general information on caring for yourself after you leave the hospital. Your doctor may also give you specific instructions. While your treatment has been planned according to the most current medical practices available, unavoidable complications occasionally occur.   ACTIVITY You may resume your regular activity, but move at a slower pace for the next 24 hours.  Take frequent rest periods for the next 24 hours.  Walking will help get rid of the air and reduce the bloated feeling in your belly (abdomen).  No driving for 24 hours (because of the medicine (anesthesia) used during the test).   Do not sign any important legal documents or operate any machinery for 24 hours (because of the anesthesia used during the test).  NUTRITION Drink plenty of fluids.  You may resume your normal diet as instructed by your doctor.  Begin with a light meal and progress to your normal diet. Heavy or fried foods are harder to digest and may make you feel sick to your stomach (nauseated).  Avoid alcoholic beverages for 24 hours or as instructed.  MEDICATIONS You may resume your normal medications unless your doctor tells you otherwise.  WHAT YOU CAN EXPECT TODAY Some feelings of bloating in the abdomen.  Passage of more gas than usual.  Spotting of blood in your stool or on the toilet paper.  IF YOU HAD POLYPS REMOVED DURING THE COLONOSCOPY: No aspirin products for 7 days or as instructed.  No alcohol for 7 days or as instructed.  Eat a soft diet for the next 24 hours.  FINDING OUT THE RESULTS OF YOUR TEST Not all test results are available during your visit. If your test results are not back during the visit, make an appointment with your caregiver to find out the results. Do not assume everything is normal if you have not heard from your  caregiver or the medical facility. It is important for you to follow up on all of your test results.  SEEK IMMEDIATE MEDICAL ATTENTION IF: You have more than a spotting of blood in your stool.  Your belly is swollen (abdominal distention).  You are nauseated or vomiting.  You have a temperature over 101.  You have abdominal pain or discomfort that is severe or gets worse throughout the day.   Your colonoscopy was relatively unremarkable.  I did not find any polyps or evidence of colon cancer.  Given your age, I do not think we need to pursue further colonoscopies for screening purposes.   You do have diverticulosis and internal hemorrhoids. I would recommend increasing fiber in your diet or adding OTC Benefiber/Metamucil. Be sure to drink at least 4 to 6 glasses of water daily. Follow-up with GI as needed.   I hope you have a great rest of your week!  Hennie Duos. Marletta Lor, D.O. [facility]

## 2023-08-23 NOTE — Anesthesia Preprocedure Evaluation (Addendum)
 Anesthesia Evaluation  Patient identified by MRN, date of birth, ID band Patient awake    Reviewed: Allergy & Precautions, H&P , NPO status , Patient's Chart, lab work & pertinent test results  Airway Mallampati: I  TM Distance: >3 FB Neck ROM: Full    Dental  (+) Edentulous Upper, Edentulous Lower   Pulmonary former smoker   Pulmonary exam normal breath sounds clear to auscultation       Cardiovascular Exercise Tolerance: Good METS: 3 - Mets hypertension, Pt. on medications Normal cardiovascular exam+ Valvular Problems/Murmurs AS  Rhythm:Regular Rate:Normal + Systolic murmurs  1. Left ventricular ejection fraction, by estimation, is 70 to 75%. The  left ventricle has hyperdynamic function. The left ventricle has no  regional wall motion abnormalities. There is mild left ventricular  hypertrophy. Left ventricular diastolic  parameters were normal.   2. Right ventricular systolic function is normal. The right ventricular  size is normal. There is normal pulmonary artery systolic pressure. The  estimated right ventricular systolic pressure is 21.8 mmHg.   3. The mitral valve is grossly normal. Trivial mitral valve  regurgitation.   4. The aortic valve is tricuspid. There is moderate calcification of the  aortic valve. Aortic valve regurgitation is mild. Moderate aortic valve  stenosis. Aortic regurgitation PHT measures 630 msec. Aortic valve mean  gradient measures 23.0 mmHg.  Dimentionless index 0.64.   5. The inferior vena cava is normal in size with greater than 50%  respiratory variability, suggesting right atrial pressure of 3 mmHg     Neuro/Psych  PSYCHIATRIC DISORDERS  Depression     Neuromuscular disease    GI/Hepatic Neg liver ROS, PUD,GERD  Medicated and Controlled,,  Endo/Other  negative endocrine ROS    Renal/GU negative Renal ROS  negative genitourinary   Musculoskeletal  (+) Arthritis ,    Abdominal    Peds negative pediatric ROS (+)  Hematology negative hematology ROS (+)   Anesthesia Other Findings   Reproductive/Obstetrics negative OB ROS                              Anesthesia Physical Anesthesia Plan  ASA: 3  Anesthesia Plan: General   Post-op Pain Management: Minimal or no pain anticipated   Induction: Intravenous  PONV Risk Score and Plan: Propofol infusion  Airway Management Planned: Natural Airway and Nasal Cannula  Additional Equipment: None  Intra-op Plan:   Post-operative Plan:   Informed Consent: I have reviewed the patients History and Physical, chart, labs and discussed the procedure including the risks, benefits and alternatives for the proposed anesthesia with the patient or authorized representative who has indicated his/her understanding and acceptance.       Plan Discussed with: CRNA  Anesthesia Plan Comments:         Anesthesia Quick Evaluation

## 2023-08-23 NOTE — Anesthesia Postprocedure Evaluation (Signed)
 Anesthesia Post Note  Patient: Adam [name]  Procedure(s) Performed: COLONOSCOPY WITH PROPOFOL  Patient location during evaluation: PACU Anesthesia Type: General Level of consciousness: awake and alert Pain management: pain level controlled Vital Signs Assessment: post-procedure vital signs reviewed and stable Respiratory status: spontaneous breathing, nonlabored ventilation, respiratory function stable and patient connected to nasal cannula oxygen Cardiovascular status: blood pressure returned to baseline and stable Postop Assessment: no apparent nausea or vomiting Anesthetic complications: no   There were no known notable events for this encounter.   Last Vitals:  Vitals:   08/23/23 0805 08/23/23 0903  BP: (!) 163/92 (!) 99/53  Pulse:  (!) 52  Resp:  16  Temp:  36.6 C  SpO2:  95%    Last Pain:  Vitals:   08/23/23 0903  TempSrc: Oral  PainSc: 0-No pain                 [name]

## 2023-08-24 ENCOUNTER — Encounter (HOSPITAL_COMMUNITY): Payer: Self-pay | Admitting: Internal Medicine

## 2023-10-06 ENCOUNTER — Other Ambulatory Visit: Payer: Self-pay | Admitting: Internal Medicine

## 2023-10-06 DIAGNOSIS — I10 Essential (primary) hypertension: Secondary | ICD-10-CM

## 2023-11-23 ENCOUNTER — Ambulatory Visit (INDEPENDENT_AMBULATORY_CARE_PROVIDER_SITE_OTHER): Payer: Medicare HMO | Admitting: Internal Medicine

## 2023-11-23 ENCOUNTER — Encounter: Payer: Self-pay | Admitting: Internal Medicine

## 2023-11-23 VITALS — BP 138/84 | HR 51 | Ht 64.0 in | Wt 163.6 lb

## 2023-11-23 DIAGNOSIS — R338 Other retention of urine: Secondary | ICD-10-CM

## 2023-11-23 DIAGNOSIS — R7303 Prediabetes: Secondary | ICD-10-CM | POA: Diagnosis not present

## 2023-11-23 DIAGNOSIS — N401 Enlarged prostate with lower urinary tract symptoms: Secondary | ICD-10-CM

## 2023-11-23 DIAGNOSIS — J329 Chronic sinusitis, unspecified: Secondary | ICD-10-CM | POA: Insufficient documentation

## 2023-11-23 DIAGNOSIS — M5136 Other intervertebral disc degeneration, lumbar region with discogenic back pain only: Secondary | ICD-10-CM | POA: Diagnosis not present

## 2023-11-23 DIAGNOSIS — I10 Essential (primary) hypertension: Secondary | ICD-10-CM

## 2023-11-23 DIAGNOSIS — R972 Elevated prostate specific antigen [PSA]: Secondary | ICD-10-CM | POA: Diagnosis not present

## 2023-11-23 MED ORDER — GUAIFENESIN-CODEINE 100-10 MG/5ML PO SOLN: 5.0000 mL | Freq: Three times a day (TID) | ORAL | 0 refills | Status: DC | PRN

## 2023-11-23 MED ORDER — MELOXICAM 7.5 MG PO TABS: 7.5000 mg | ORAL_TABLET | Freq: Every day | ORAL | 2 refills | Status: DC

## 2023-11-23 NOTE — Assessment & Plan Note (Signed)
 Could be due to BPH Recheck PSA today

## 2023-11-23 NOTE — Progress Notes (Signed)
 Established Patient Office Visit  Subjective:  Patient ID: Adam [name], male    DOB: 09/20/1950  Age: 73 y.o. MRN: [id number]  CC:  Chief Complaint  Patient presents with   Hypertension    Six month follow up   Back Pain    Off and on lower back pain    Cough    Coughing up phlegm, yellowish      HPI Adam [name] is a 73 y.o. male with past medical history of HTN, GERD, eosinophilic gastritis, BPH, HLD and depression who presents for f/u of his chronic medical conditions.  HTN: BP is well-controlled  at home. Takes medications regularly.  He takes valsartan -HCTZ 320-25 mg QD, hydralazine  25 mg BID and verapamil  180 mg BID. Patient denies headache, dizziness, chest pain, dyspnea or palpitations.  BPH: He is taking doxazosin . He reports improvement in urinary retention and postvoid dribbling currently.  Denies any dysuria or hematuria.  His PSA was elevated - 4.4 in 12/24.  GERD: He takes Pantoprazole  for it. He denies any dysphagia or odynophagia currently.  Chronic constipation: He reports chronic constipation, has hard stools, has to strain at times.  He has been taking Dulcolax with mild relief. He currently denies any episode of melena, hematochezia or diarrhea.  He also has history of internal hemorrhoids and has used Anusol  suppository with adequate response.  Lumbar DDD: He has chronic low back pain, intermittent, dull, nonradiating.  His pain improves after bowel movement.  Denies any recent injury or fall.  He has tried taking Tylenol  with mild relief.  Denies any numbness or tingling of the LE.  He reports intermittent cough with mucoid expectoration.  Denies any fever, chills, dyspnea or wheezing currently.  Of note, he has history of chronic sinusitis and uses Flonase  for it.   Past Medical History:  Diagnosis Date   Allergy    BMI 31.0-31.9,adult JUNE 2011 180 LBS    Chronic back pain    Depression    Eosinophilic granuloma (HCC) JAN 2011 GASTRIC,  followed by Dr. Yehuda Helms   EGD JAN 2011, JUN 2011, NOV 2011   Gastric ulcer 08/31/2012   GERD (gastroesophageal reflux disease)    HTN (hypertension)    Hyperlipemia    IBS (irritable bowel syndrome)    Prostate hypertrophy     Past Surgical History:  Procedure Laterality Date   BACK SURGERY     COLONOSCOPY  JAN 2011 COMPLETE   hypoTN and bradycardia w/ Demerol  ,Phenergan and Versed , [place] TICS, SML IH   COLONOSCOPY  2003   NUR-normal   COLONOSCOPY  06/21/09   Fields-sigmoid diverticulosis, sm int hemorrhoids   COLONOSCOPY N/A 10/24/2013   Normal mucosa in the terminal ileum Moderate diverticulosis noted in the sigmoid colonModerate sized internal hemorrhoids. negative random colon bx.. next TCS 10/2023   COLONOSCOPY WITH PROPOFOL  N/A 08/23/2023   Procedure: COLONOSCOPY WITH PROPOFOL ;  Surgeon: Vinetta Greening, DO;  Location: [facility];  Service: Endoscopy;  Laterality: N/A;  9:30 am, asa 3   Cyst Removed     from Left leg and knee   ESOPHAGOGASTRODUODENOSCOPY  05/20/2012   SLF: MILD ESOPHAGITIS.NO BARRETT'S/Multiple ulcers ranging between 3-5 mm/MILD Duodenal inflammation was found in the duodenal bulb   ESOPHAGOGASTRODUODENOSCOPY N/A 09/05/2012   SLF: MILD Non-erosive gastritis (inflammation) in the gastric antrum. Biopsies benign, no H. pylori.   ESOPHAGOGASTRODUODENOSCOPY N/A 10/24/2013   NGE:[id number] DISTAL ESOPHAGEAL WEBSmall hiatal herniaMILD Non-erosive gastritis. SB bx benign. gastric bx, minimal inflammation.  GANGLION CYST EXCISION     from L wrist and pins placed for Fx involving L thumb   HAND SURGERY  at the age of 3   Brother accidentally cut it   MALONEY DILATION N/A 10/24/2013   Procedure: Londa Rival DILATION;  Surgeon: Alyce Jubilee, MD;  Location: [facility];  Service: Endoscopy;  Laterality: N/A;   MASS EXCISION  04/29/2012   Procedure: EXCISION MASS;  Surgeon: Lovena Rubinstein, MD;  Location: [facility];  Service: General;  Laterality: Right;  Excision of Vascular  Mass Right Arm   SAVORY DILATION N/A 10/24/2013   Procedure: SAVORY DILATION;  Surgeon: Alyce Jubilee, MD;  Location: [facility];  Service: Endoscopy;  Laterality: N/A;   SHOULDER SURGERY Left 2017   TUMOR REMOVAL  1994   [facility]   UPPER GASTROINTESTINAL ENDOSCOPY  JAN 2011   GASTRIC ULCER-EOS GRANULOMA   UPPER GASTROINTESTINAL ENDOSCOPY  JUN 2011   GASTRIC ULCER- EOS, NO H. PYLORI   UPPER GASTROINTESTINAL ENDOSCOPY  NOV 2011   GASTRIC NODULE, no ulcer- EOS   XI ROBOTIC ASSISTED INGUINAL HERNIA REPAIR WITH MESH Right 09/25/2022   Procedure: XI ROBOTIC ASSISTED INGUINAL HERNIA REPAIR WITH MESH;  Surgeon: Marijo Shove, DO;  Location: [facility];  Service: General;  Laterality: Right;    Family History  Problem Relation Age of Onset   Hypertension Mother    Diabetes Mother    Hyperlipidemia Mother    Thyroid  disease Mother    Hypertension Father    Diabetes Father    Stroke Father    Colon cancer Maternal Grandmother        OVER 57    Social History   Socioeconomic History   Marital status: Married    Spouse name: Felipa Horsfall   Number of children: 1   Years of education: 12   Highest education level: Not on file  Occupational History   Occupation: disabled  Tobacco Use   Smoking status: Former    Current packs/day: 0.00    Average packs/day: 0.3 packs/day for 15.0 years (3.8 ttl pk-yrs)    Types: Cigars, Cigarettes    Start date: 04/30/1987    Quit date: 04/29/2002    Years since quitting: 21.5   Smokeless tobacco: Never  Vaping Use   Vaping status: Never Used  Substance and Sexual Activity   Alcohol use: Not Currently    Comment: rarely   Drug use: No   Sexual activity: Yes    Birth control/protection: None  Other Topics Concern   Not on file  Social History Narrative   Lives w/ wife   Caffeine use: 1 cup coffee every morning   Social Drivers of Health   Financial Resource Strain: Low Risk  (06/21/2023)   Overall  Financial Resource Strain (CARDIA)    Difficulty of Paying Living Expenses: Not hard at all  Food Insecurity: No Food Insecurity (06/21/2023)   Hunger Vital Sign    Worried About Running Out of Food in the Last Year: Never true    Ran Out of Food in the Last Year: Never true  Transportation Needs: No Transportation Needs (06/21/2023)   PRAPARE - Administrator, Civil Service (Medical): No    Lack of Transportation (Non-Medical): No  Physical Activity: Sufficiently Active (06/21/2023)   Exercise Vital Sign    Days of Exercise per Week: 7 days    Minutes of Exercise per Session: 30 min  Stress: No  Stress Concern Present (06/21/2023)   [employer] of Occupational Health - Occupational Stress Questionnaire    Feeling of Stress : Not at all  Social Connections: Moderately Integrated (06/21/2023)   Social Connection and Isolation Panel    Frequency of Communication with Friends and Family: More than three times a week    Frequency of Social Gatherings with Friends and Family: More than three times a week    Attends Religious Services: More than 4 times per year    Active Member of [employer] or Organizations: No    Attends Banker Meetings: Never    Marital Status: Married  Catering manager Violence: Not At Risk (06/21/2023)   Humiliation, Afraid, Rape, and Kick questionnaire    Fear of Current or Ex-Partner: No    Emotionally Abused: No    Physically Abused: No    Sexually Abused: No    Outpatient Medications Prior to Visit  Medication Sig Dispense Refill   doxazosin  (CARDURA ) 8 MG tablet TAKE 1 TABLET AT BEDTIME 90 tablet 3   fluticasone  (FLONASE ) 50 MCG/ACT nasal spray SPRAY 2 SPRAYS INTO EACH NOSTRIL EVERY DAY 48 mL 1   hydrALAZINE  (APRESOLINE ) 25 MG tablet TAKE 1 TABLET TWICE DAILY 180 tablet 3   hydrocortisone  (ANUSOL -HC) 25 MG suppository Place 1 suppository (25 mg total) rectally 2 (two) times daily as needed for hemorrhoids or anal itching. 12 suppository 0    loratadine (CLARITIN) 10 MG tablet Take 10 mg by mouth daily as needed for allergies.      pantoprazole  (PROTONIX ) 40 MG tablet TAKE 1 TABLET TWICE DAILY 180 tablet 3   PARoxetine  (PAXIL ) 20 MG tablet TAKE 1 TABLET EVERY DAY 90 tablet 3   pravastatin  (PRAVACHOL ) 20 MG tablet TAKE 1 TABLET AT BEDTIME (NEED MD APPOINTMENT) 90 tablet 3   valsartan -hydrochlorothiazide  (DIOVAN -HCT) 320-25 MG tablet TAKE 1 TABLET EVERY DAY 90 tablet 3   verapamil  (CALAN -SR) 180 MG CR tablet Take 1 tablet (180 mg total) by mouth 2 (two) times daily. 180 tablet 3   amoxicillin -clavulanate (AUGMENTIN ) 875-125 MG tablet Take 1 tablet by mouth 2 (two) times daily. 14 tablet 0   guaiFENesin -codeine  100-10 MG/5ML syrup Take 5 mLs by mouth 3 (three) times daily as needed for cough. 120 mL 0   No facility-administered medications prior to visit.    Allergies  Allergen Reactions   Sulfonamide Derivatives     REACTION: causes hives    ROS Review of Systems  Constitutional:  Negative for chills and fever.  HENT:  Negative for congestion and sore throat.   Eyes:  Negative for pain and discharge.  Respiratory:  Negative for cough and shortness of breath.   Cardiovascular:  Negative for chest pain and palpitations.  Gastrointestinal:  Positive for constipation. Negative for diarrhea, nausea and vomiting.  Endocrine: Negative for polydipsia and polyuria.  Genitourinary:  Negative for dysuria and hematuria.  Musculoskeletal:  Positive for arthralgias and back pain. Negative for neck pain and neck stiffness.  Skin:  Negative for rash.  Neurological:  Negative for dizziness, weakness, numbness and headaches.  Psychiatric/Behavioral:  Negative for agitation and behavioral problems.       Objective:    Physical Exam Vitals reviewed.  Constitutional:      General: He is not in acute distress.    Appearance: He is not diaphoretic.  HENT:     Head: Normocephalic and atraumatic.     Nose: Congestion present.      Mouth/Throat:     Mouth:  Mucous membranes are moist.   Eyes:     General: No scleral icterus.    Extraocular Movements: Extraocular movements intact.    Cardiovascular:     Rate and Rhythm: Normal rate and regular rhythm.     Heart sounds: Murmur heard.  Pulmonary:     Breath sounds: Normal breath sounds. No wheezing or rales.   Musculoskeletal:     Cervical back: Neck supple. No tenderness.     Right lower leg: No edema.     Left lower leg: No edema.   Skin:    General: Skin is warm.     Findings: No rash.     Comments: S/p excision of mole over scalp   Neurological:     General: No focal deficit present.     Mental Status: He is alert and oriented to person, place, and time.   Psychiatric:        Mood and Affect: Mood normal.        Behavior: Behavior normal.     BP 138/84 (BP Location: Left Arm) Comment: At home  Pulse (!) 51   Ht 5' 4 (1.626 m)   Wt 163 lb 9.6 oz (74.2 kg)   SpO2 96%   BMI 28.08 kg/m  Wt Readings from Last 3 Encounters:  11/23/23 163 lb 9.6 oz (74.2 kg)  08/23/23 173 lb 11.6 oz (78.8 kg)  08/18/23 173 lb 11.6 oz (78.8 kg)    Lab Results  Component Value Date   TSH 2.070 05/26/2023   Lab Results  Component Value Date   WBC 5.2 05/26/2023   HGB 13.7 05/26/2023   HCT 41.6 05/26/2023   MCV 85 05/26/2023   PLT 319 05/26/2023   Lab Results  Component Value Date   NA 138 08/18/2023   K 3.2 (L) 08/18/2023   CO2 24 08/18/2023   GLUCOSE 88 08/18/2023   BUN 27 (H) 08/18/2023   CREATININE 1.25 (H) 08/18/2023   BILITOT 0.5 05/26/2023   ALKPHOS 62 05/26/2023   AST 17 05/26/2023   ALT 15 05/26/2023   PROT 6.3 05/26/2023   ALBUMIN 4.1 05/26/2023   CALCIUM  9.1 08/18/2023   ANIONGAP 11 08/18/2023   EGFR 59 (L) 05/26/2023   Lab Results  Component Value Date   CHOL 188 05/26/2023   Lab Results  Component Value Date   HDL 67 05/26/2023   Lab Results  Component Value Date   LDLCALC 107 (H) 05/26/2023   Lab Results  Component  Value Date   TRIG 75 05/26/2023   Lab Results  Component Value Date   CHOLHDL 2.8 05/26/2023   Lab Results  Component Value Date   HGBA1C 5.6 01/04/2023      Assessment & Plan:   Problem List Items Addressed This Visit       Cardiovascular and Mediastinum   Essential hypertension - Primary   BP Readings from Last 1 Encounters:  11/23/23 138/84   Elevated today as he has not had antihypertensives today Well-controlled with Verapamil  180 mg BID, Valsartan -HCTZ 320-25 mg QD, Hydralazine  25 mg BID and Doxazosin  8 mg QD Counseled for compliance with the medications Advised DASH diet and moderate exercise/walking, at least 150 mins/week      Relevant Orders   CMP14+EGFR     Respiratory   Chronic sinusitis   Chronic nasal congestion, postnasal drip and cough likely due to chronic sinusitis Flonase  for nasal congestion Advised to use vaporizer for nasal congestion Guaifenesin -codeine  syrup as needed for cough  Relevant Medications   guaiFENesin -codeine  100-10 MG/5ML syrup     Musculoskeletal and Integument   Degeneration of intervertebral disc of lumbar region with discogenic back pain   Has chronic low back pain Recent x-ray of lumbar spine showed DDD of lumbar spine and mild anterolisthesis at L4-L5 Meloxicam 7.5 mg as needed for back pain Offered PT, but he prefers to wait for now Simple back exercises material provided      Relevant Medications   meloxicam (MOBIC) 7.5 MG tablet     Genitourinary   BPH (benign prostatic hyperplasia)   Has urinary retention - now improved On Doxazosin  8 mg QD PSA elevated in 12/24, will recheck today      Relevant Orders   PSA     Other   Elevated PSA   Could be due to BPH Recheck PSA today      Relevant Orders   PSA   Prediabetes   Lab Results  Component Value Date   HGBA1C 5.6 01/04/2023   Advised to follow DASH diet      Relevant Orders   Hemoglobin A1c     Meds ordered this encounter   Medications   meloxicam (MOBIC) 7.5 MG tablet    Sig: Take 1 tablet (7.5 mg total) by mouth daily.    Dispense:  30 tablet    Refill:  2   guaiFENesin -codeine  100-10 MG/5ML syrup    Sig: Take 5 mLs by mouth 3 (three) times daily as needed for cough.    Dispense:  120 mL    Refill:  0    Follow-up: Return in about 6 months (around 05/24/2024) for Annual physical (after 05/24/24).    Meldon Sport, MD

## 2023-11-23 NOTE — Assessment & Plan Note (Addendum)
 BP Readings from Last 1 Encounters:  11/23/23 138/84   Elevated today as he has not had antihypertensives today Well-controlled with Verapamil  180 mg BID, Valsartan -HCTZ 320-25 mg QD, Hydralazine  25 mg BID and Doxazosin  8 mg QD Counseled for compliance with the medications Advised DASH diet and moderate exercise/walking, at least 150 mins/week

## 2023-11-23 NOTE — Assessment & Plan Note (Signed)
 Has urinary retention - now improved On Doxazosin  8 mg QD PSA elevated in 12/24, will recheck today

## 2023-11-23 NOTE — Patient Instructions (Addendum)
 Please take Tylenol  arthritis for back pain. Please take Meloxicam as needed for persistent back pain.  Please continue to take other medications as prescribed.  Please continue to follow low salt diet and perform moderate exercise/walking at least 150 mins/week.

## 2023-11-23 NOTE — Assessment & Plan Note (Signed)
 Has chronic low back pain Recent x-ray of lumbar spine showed DDD of lumbar spine and mild anterolisthesis at L4-L5 Meloxicam 7.5 mg as needed for back pain Offered PT, but he prefers to wait for now Simple back exercises material provided

## 2023-11-23 NOTE — Assessment & Plan Note (Signed)
 Chronic nasal congestion, postnasal drip and cough likely due to chronic sinusitis Flonase  for nasal congestion Advised to use vaporizer for nasal congestion Guaifenesin -codeine  syrup as needed for cough

## 2023-11-23 NOTE — Assessment & Plan Note (Signed)
 Lab Results  Component Value Date   HGBA1C 5.6 01/04/2023   Advised to follow DASH diet

## 2023-11-24 ENCOUNTER — Ambulatory Visit: Payer: Self-pay | Admitting: Internal Medicine

## 2023-11-24 ENCOUNTER — Telehealth: Payer: Self-pay

## 2023-11-24 LAB — CMP14+EGFR
ALT: 22 IU/L (ref 0–44)
AST: 23 IU/L (ref 0–40)
Albumin: 4.1 g/dL (ref 3.8–4.8)
Alkaline Phosphatase: 54 IU/L (ref 44–121)
BUN/Creatinine Ratio: 17 (ref 10–24)
BUN: 20 mg/dL (ref 8–27)
Bilirubin Total: 0.5 mg/dL (ref 0.0–1.2)
CO2: 21 mmol/L (ref 20–29)
Calcium: 9.8 mg/dL (ref 8.6–10.2)
Chloride: 101 mmol/L (ref 96–106)
Creatinine, Ser: 1.19 mg/dL (ref 0.76–1.27)
Globulin, Total: 1.9 g/dL (ref 1.5–4.5)
Glucose: 86 mg/dL (ref 70–99)
Potassium: 3.9 mmol/L (ref 3.5–5.2)
Sodium: 138 mmol/L (ref 134–144)
Total Protein: 6 g/dL (ref 6.0–8.5)
eGFR: 64 mL/min/{1.73_m2} (ref >59)

## 2023-11-24 LAB — HEMOGLOBIN A1C
Est. average glucose Bld gHb Est-mCnc: 111 mg/dL
Hgb A1c MFr Bld: 5.5 % (ref 4.8–5.6)

## 2023-11-24 LAB — PSA: Prostate Specific Ag, Serum: 4 ng/mL (ref 0.0–4.0)

## 2023-11-24 NOTE — Telephone Encounter (Signed)
 Copied from CRM [id number]. Topic: Clinical - Lab/Test Results >> Nov 24, 2023  9:15 AM Marissa P wrote: Reason for CRM: Patient returned call, was able to relay to him Meldon Sport, MD  message about his results, no further questions asked. Thank you >> Nov 24, 2023  2:07 PM Carla L wrote: Pt informed by wife to call back. Per Kee Pastel' note Wife to have patient call us  back Pt states he received PSA results earlier today.   FYI

## 2023-11-24 NOTE — Telephone Encounter (Signed)
 Copied from CRM [id number]. Topic: Clinical - Lab/Test Results >> Nov 24, 2023  9:15 AM Marissa P wrote: Reason for CRM: Patient returned call, was able to relay to him Adam Sport, MD  message about his results, no further questions asked. Thank you

## 2024-03-05 ENCOUNTER — Other Ambulatory Visit: Payer: Self-pay | Admitting: Internal Medicine

## 2024-03-05 DIAGNOSIS — E785 Hyperlipidemia, unspecified: Secondary | ICD-10-CM

## 2024-03-05 DIAGNOSIS — K219 Gastro-esophageal reflux disease without esophagitis: Secondary | ICD-10-CM

## 2024-03-08 ENCOUNTER — Ambulatory Visit: Payer: Self-pay

## 2024-03-08 NOTE — Telephone Encounter (Signed)
 Appt made.

## 2024-03-08 NOTE — Telephone Encounter (Signed)
 FYI Only or Action Required?: FYI only for provider.  Patient was last seen in primary care on 11/23/2023 by Adam Suzzane POUR, MD.  Called Nurse Triage reporting Back Pain.  Symptoms began several weeks ago.  Interventions attempted: OTC medications: Tylenol .  Symptoms are: unchanged.  Triage Disposition: See HCP Within 4 Hours (Or PCP Triage)  Patient/caregiver understands and will follow disposition?: Yes  **Appt. Scheduled for 10/2**    Copied from CRM #[id number]. Topic: Clinical - Red Word Triage >> Mar 08, 2024  2:27 PM Kevelyn M wrote: Patient started experiencing severe back pain 3 weeks ago. It hurts all of the time, but it worsens with certain movements. Reason for Disposition  [1] SEVERE back pain (e.g., excruciating, unable to do any normal activities) AND [2] not improved 2 hours after pain medicine  Answer Assessment - Initial Assessment Questions 1. ONSET: When did the pain begin? (e.g., minutes, hours, days)     X 3 weeks   2. LOCATION: Where does it hurt? (upper, mid or lower back)     Lower back  3. SEVERITY: How bad is the pain?  (e.g., Scale 1-10; mild, moderate, or severe)     10/10 with movement   4. PATTERN: Is the pain constant? (e.g., yes, no; constant, intermittent)      Constant  5. RADIATION: Does the pain shoot into your legs or somewhere else?     No   6. CAUSE:  What do you think is causing the back pain?      Unsure   7. BACK OVERUSE:  Any recent lifting of heavy objects, strenuous work or exercise?     No   8. MEDICINES: What have you taken so far for the pain? (e.g., nothing, acetaminophen , NSAIDS)     Tylenol    9. NEUROLOGIC SYMPTOMS: Do you have any weakness, numbness, or problems with bowel/bladder control?      No, but reports darker color stool x 2 days    10. OTHER SYMPTOMS: Do you have any other symptoms? (e.g., fever, abdomen pain, burning with urination, blood in urine)  No  Appt. Scheduled for  10/2  Protocols used: Back Pain-A-AH

## 2024-03-09 ENCOUNTER — Ambulatory Visit (INDEPENDENT_AMBULATORY_CARE_PROVIDER_SITE_OTHER): Payer: Self-pay | Admitting: Family Medicine

## 2024-03-09 ENCOUNTER — Encounter: Payer: Self-pay | Admitting: Family Medicine

## 2024-03-09 VITALS — BP 153/83 | HR 64 | Ht 64.0 in | Wt 167.0 lb

## 2024-03-09 DIAGNOSIS — M545 Low back pain, unspecified: Secondary | ICD-10-CM

## 2024-03-09 MED ORDER — DICLOFENAC SODIUM 75 MG PO TBEC: 75.0000 mg | DELAYED_RELEASE_TABLET | Freq: Two times a day (BID) | ORAL | 0 refills | Status: AC | PRN

## 2024-03-09 MED ORDER — BACLOFEN 10 MG PO TABS: 5.0000 mg | ORAL_TABLET | Freq: Three times a day (TID) | ORAL | 0 refills | Status: DC | PRN

## 2024-03-09 NOTE — Patient Instructions (Signed)
 Referral placed.  Follow up as needed (as you normally do with Dr. Tobie).

## 2024-03-12 DIAGNOSIS — M545 Low back pain, unspecified: Secondary | ICD-10-CM | POA: Insufficient documentation

## 2024-03-12 NOTE — Assessment & Plan Note (Signed)
 Treating with Diclofenac  and Baclofen. Referring to PT.

## 2024-03-12 NOTE — Progress Notes (Signed)
 Subjective:  Patient ID: Adam [name], male    DOB: 1950-12-01  Age: 73 y.o. MRN: [id number]  CC:   Chief Complaint  Patient presents with   Back Pain    Back pain for three weeks, has progressed to hurting when walking    HPI:  73 year old male presents with back pain.  3 week history of low back pain. Bilateral. No radicular symptoms. Worse with activity. No relieving factors. No other associated symptoms. No other complaints.  Patient Active Problem List   Diagnosis Date Noted   Bilateral low back pain without sciatica 03/12/2024   Elevated PSA 11/23/2023   Chronic sinusitis 11/23/2023   Prediabetes 11/23/2023   Hemorrhoids 05/25/2023   Degeneration of intervertebral disc of lumbar region with discogenic back pain 05/25/2023   Proteinuria 01/12/2023   S/P inguinal hernia repair 10/01/2022   Vitamin D  deficiency 03/09/2022   OA (osteoarthritis) of knee 11/05/2021   Nonrheumatic aortic valve stenosis 08/28/2021   BPH (benign prostatic hyperplasia) 02/26/2021   Systolic murmur 02/26/2021   Carpal tunnel syndrome 01/12/2020   Depression 03/10/2016   Cervical disc disorder with radiculopathy of cervical region 10/31/2014   IBS (irritable bowel syndrome) 10/19/2013   Hyperlipidemia 09/20/2012   Essential hypertension 09/20/2012   GERD (gastroesophageal reflux disease) 12/30/2011   Eosinophilic gastritis 07/07/2011    Social Hx   Social History   Socioeconomic History   Marital status: Married    Spouse name: Heron   Number of children: 1   Years of education: 12   Highest education level: Not on file  Occupational History   Occupation: disabled  Tobacco Use   Smoking status: Former    Current packs/day: 0.00    Average packs/day: 0.3 packs/day for 15.0 years (3.8 ttl pk-yrs)    Types: Cigars, Cigarettes    Start date: 04/30/1987    Quit date: 04/29/2002    Years since quitting: 21.8   Smokeless tobacco: Never  Vaping Use   Vaping status: Never  Used  Substance and Sexual Activity   Alcohol use: Not Currently    Comment: rarely   Drug use: No   Sexual activity: Yes    Birth control/protection: None  Other Topics Concern   Not on file  Social History Narrative   Lives w/ wife   Caffeine use: 1 cup coffee every morning   Social Drivers of Health   Financial Resource Strain: Low Risk  (06/21/2023)   Overall Financial Resource Strain (CARDIA)    Difficulty of Paying Living Expenses: Not hard at all  Food Insecurity: No Food Insecurity (06/21/2023)   Hunger Vital Sign    Worried About Running Out of Food in the Last Year: Never true    Ran Out of Food in the Last Year: Never true  Transportation Needs: No Transportation Needs (06/21/2023)   PRAPARE - Administrator, Civil Service (Medical): No    Lack of Transportation (Non-Medical): No  Physical Activity: Sufficiently Active (06/21/2023)   Exercise Vital Sign    Days of Exercise per Week: 7 days    Minutes of Exercise per Session: 30 min  Stress: No Stress Concern Present (06/21/2023)   [employer] of Occupational Health - Occupational Stress Questionnaire    Feeling of Stress : Not at all  Social Connections: Moderately Integrated (06/21/2023)   Social Connection and Isolation Panel    Frequency of Communication with Friends and Family: More than three times a week    Frequency  of Social Gatherings with Friends and Family: More than three times a week    Attends Religious Services: More than 4 times per year    Active Member of Clubs or Organizations: No    Attends Banker Meetings: Never    Marital Status: Married    Review of Systems Per HPI  Objective:  BP (!) 153/83   Pulse 64   Ht 5' 4 (1.626 m)   Wt 167 lb (75.8 kg)   SpO2 96%   BMI 28.67 kg/m      03/09/2024   10:58 AM 11/23/2023    8:37 AM 11/23/2023    8:21 AM  BP/Weight  Systolic BP 153 138 152  Diastolic BP 83 84 82  Wt. (Lbs) 167    BMI 28.67 kg/m2       Physical Exam Vitals and nursing note reviewed.  Constitutional:      General: He is not in acute distress. HENT:     Head: Normocephalic and atraumatic.  Cardiovascular:     Rate and Rhythm: Normal rate and regular rhythm.  Pulmonary:     Effort: Pulmonary effort is normal.     Breath sounds: Normal breath sounds.  Musculoskeletal:     Comments: Decreased ROM of the low back.  Neurological:     Mental Status: He is alert.  Psychiatric:        Mood and Affect: Mood normal.        Behavior: Behavior normal.     Lab Results  Component Value Date   WBC 5.2 05/26/2023   HGB 13.7 05/26/2023   HCT 41.6 05/26/2023   PLT 319 05/26/2023   GLUCOSE 86 11/23/2023   CHOL 188 05/26/2023   TRIG 75 05/26/2023   HDL 67 05/26/2023   LDLCALC 107 (H) 05/26/2023   ALT 22 11/23/2023   AST 23 11/23/2023   NA 138 11/23/2023   K 3.9 11/23/2023   CL 101 11/23/2023   CREATININE 1.19 11/23/2023   BUN 20 11/23/2023   CO2 21 11/23/2023   TSH 2.070 05/26/2023   PSA 1.18 04/05/2014   HGBA1C 5.5 11/23/2023     Assessment & Plan:  Bilateral low back pain without sciatica, unspecified chronicity Assessment & Plan: Treating with Diclofenac  and Baclofen. Referring to PT.  Orders: -     Ambulatory referral to Physical Therapy -     Diclofenac  Sodium; Take 1 tablet (75 mg total) by mouth 2 (two) times daily as needed for moderate pain (pain score 4-6).  Dispense: 30 tablet; Refill: 0 -     Baclofen; Take 0.5-1 tablets (5-10 mg total) by mouth 3 (three) times daily as needed for muscle spasms. Caution dizziness.  Dispense: 30 each; Refill: 0    Follow-up:  Return if symptoms worsen or fail to improve.  Jacqulyn Ahle DO [facility]

## 2024-04-03 ENCOUNTER — Other Ambulatory Visit: Payer: Self-pay | Admitting: Internal Medicine

## 2024-04-03 DIAGNOSIS — I10 Essential (primary) hypertension: Secondary | ICD-10-CM

## 2024-04-04 NOTE — Therapy (Signed)
 OUTPATIENT PHYSICAL THERAPY THORACOLUMBAR EVALUATION   Patient Name: Adam [name] MRN: [id number] DOB:[birth year], 73 y.o., male Today's Date: 04/05/2024  END OF SESSION:  PT End of Session - 04/05/24 1130     Visit Number 1    Date for Recertification  05/17/24    Authorization Type [employer] MEDICARE HMO    Authorization Time Period seeking auth    Authorization - Visit Number 1    Progress Note Due on Visit 10    PT Start Time 0816    PT Stop Time 0858    PT Time Calculation (min) 42 min    Activity Tolerance Patient tolerated treatment well;Patient limited by pain    Behavior During Therapy [facility] for tasks assessed/performed          Past Medical History:  Diagnosis Date   Allergy    BMI 31.0-31.9,adult JUNE 2011 180 LBS    Chronic back pain    Depression    Eosinophilic granuloma (HCC) JAN 2011 GASTRIC, followed by Dr. Cathern   EGD JAN 2011, JUN 2011, NOV 2011   Gastric ulcer 08/31/2012   GERD (gastroesophageal reflux disease)    HTN (hypertension)    Hyperlipemia    IBS (irritable bowel syndrome)    Prostate hypertrophy    Past Surgical History:  Procedure Laterality Date   BACK SURGERY     COLONOSCOPY  JAN 2011 COMPLETE   hypoTN and bradycardia w/ Demerol  ,Phenergan and Versed , [place] TICS, SML IH   COLONOSCOPY  2003   NUR-normal   COLONOSCOPY  06/21/09   Fields-sigmoid diverticulosis, sm int hemorrhoids   COLONOSCOPY N/A 10/24/2013   Normal mucosa in the terminal ileum Moderate diverticulosis noted in the sigmoid colonModerate sized internal hemorrhoids. negative random colon bx.. next TCS 10/2023   COLONOSCOPY WITH PROPOFOL  N/A 08/23/2023   Procedure: COLONOSCOPY WITH PROPOFOL ;  Surgeon: Cindie Carlin POUR, DO;  [facility];  Service: Endoscopy;  Laterality: N/A;  9:30 am, asa 3   Cyst Removed     from Left leg and knee   ESOPHAGOGASTRODUODENOSCOPY  05/20/2012   SLF: MILD ESOPHAGITIS.NO BARRETT'S/Multiple ulcers ranging between 3-5 mm/MILD  Duodenal inflammation was found in the duodenal bulb   ESOPHAGOGASTRODUODENOSCOPY N/A 09/05/2012   SLF: MILD Non-erosive gastritis (inflammation) in the gastric antrum. Biopsies benign, no H. pylori.   ESOPHAGOGASTRODUODENOSCOPY N/A 10/24/2013   DOQ:ENDDPAOZ DISTAL ESOPHAGEAL WEBSmall hiatal herniaMILD Non-erosive gastritis. SB bx benign. gastric bx, minimal inflammation.    GANGLION CYST EXCISION     from L wrist and pins placed for Fx involving L thumb   HAND SURGERY  at the age of 3   Brother accidentally cut it   MALONEY DILATION N/A 10/24/2013   Procedure: AGAPITO DILATION;  Surgeon: Margo LITTIE Haddock, MD;  Location: [facility];  Service: Endoscopy;  Laterality: N/A;   MASS EXCISION  04/29/2012   Procedure: EXCISION MASS;  Surgeon: Thresa JAYSON Pulling, MD;  Location: [facility];  Service: General;  Laterality: Right;  Excision of Vascular Mass Right Arm   SAVORY DILATION N/A 10/24/2013   Procedure: SAVORY DILATION;  Surgeon: Margo LITTIE Haddock, MD;  Location[facility];  Service: Endoscopy;  Laterality: N/A;   SHOULDER SURGERY Left 2017   TUMOR REMOVAL  1994   Benign from brain at [facility]   UPPER GASTROINTESTINAL ENDOSCOPY  JAN 2011   GASTRIC ULCER-EOS GRANULOMA   UPPER GASTROINTESTINAL ENDOSCOPY  JUN 2011   GASTRIC ULCER- EOS, NO H. PYLORI   UPPER  GASTROINTESTINAL ENDOSCOPY  NOV 2011   GASTRIC NODULE, no ulcer- EOS   XI ROBOTIC ASSISTED INGUINAL HERNIA REPAIR WITH MESH Right 09/25/2022   Procedure: XI ROBOTIC ASSISTED INGUINAL HERNIA REPAIR WITH MESH;  Surgeon: Evonnie Dorothyann LABOR, DO;  Location: [facility];  Service: General;  Laterality: Right;   Patient Active Problem List   Diagnosis Date Noted   Bilateral low back pain without sciatica 03/12/2024   Elevated PSA 11/23/2023   Chronic sinusitis 11/23/2023   Prediabetes 11/23/2023   Hemorrhoids 05/25/2023   Degeneration of intervertebral disc of lumbar region with discogenic back pain 05/25/2023   Proteinuria 01/12/2023   S/P  inguinal hernia repair 10/01/2022   Vitamin D  deficiency 03/09/2022   OA (osteoarthritis) of knee 11/05/2021   Nonrheumatic aortic valve stenosis 08/28/2021   BPH (benign prostatic hyperplasia) 02/26/2021   Systolic murmur 02/26/2021   Carpal tunnel syndrome 01/12/2020   Depression 03/10/2016   Cervical disc disorder with radiculopathy of cervical region 10/31/2014   IBS (irritable bowel syndrome) 10/19/2013   Hyperlipidemia 09/20/2012   Essential hypertension 09/20/2012   GERD (gastroesophageal reflux disease) 12/30/2011   Eosinophilic gastritis 07/07/2011    PCP: Tobie Suzzane POUR, MD   REFERRING PROVIDER: Bluford Jacqulyn MATSU, DO  REFERRING DIAG: M54.50 (ICD-10-CM) - Bilateral low back pain without sciatica, unspecified chronicity  Rationale for Evaluation and Treatment: Rehabilitation  THERAPY DIAG:  Other low back pain - Plan: PT plan of care cert/re-cert  Weakness of both lower extremities - Plan: PT plan of care cert/re-cert  Impaired functional mobility and activity tolerance - Plan: PT plan of care cert/re-cert  ONSET DATE: 2-3 months  SUBJECTIVE:                                                                                                                                                                                           SUBJECTIVE STATEMENT: Pt states he had a back operation back in 2010. Pt states back pain does not go into the legs and stays in low back. Pt states the back bothers him the most after mowing the yard and bending over.   PERTINENT HISTORY:  Low back surgery about 15 years ago HTN  PAIN:  Are you having pain? Yes: NPRS scale: 5/10 Pain location: middle of low back Pain description: constant, ache Aggravating factors: bending, first getting up, riding lawn mower Relieving factors: exercise, pain relievers  PRECAUTIONS: None  RED FLAGS: None   WEIGHT BEARING RESTRICTIONS: No  FALLS:  Has patient fallen in last 6 months? 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  DIAGNOSTIC FINDINGS:  CLINICAL DATA:  Acute back pain.   EXAM: LUMBAR SPINE - 2-3 VIEW   COMPARISON:  01/21/2018.   FINDINGS: There is no evidence of acute lumbar spine fracture. There is mild anterolisthesis at C4-C5. Intervertebral disc space narrowing and degenerative endplate changes are noted at multiple levels and most pronounced at L4-L5. Mild facet arthropathy is noted.   IMPRESSION: 1. No acute fracture. 2. Multilevel degenerative disc disease and facet arthropathy. 3. Mild anterolisthesis at L4-L5, increased from 2019.  PATIENT SURVEYS:  Modified Oswestry: 19 / 50 = 38.0 %  COGNITION: Overall cognitive status: Within functional limits for tasks assessed     SENSATION: WFL   POSTURE: rounded shoulders, forward head, increased thoracic kyphosis, and flexed trunk   PALPATION: Pt demonstrates point tenderness to L2-L4 spinal segments, no tenderness noted with UPAs or palpation of sciatic nerve region bilaterally.  LUMBAR ROM:   AROM eval  Flexion 70, pain  Extension 20, worse pain  Right lateral flexion 25, pain  Left lateral flexion 12, worse pain  Right rotation 75% available  Left rotation 75% available   (Blank rows = not tested)  LOWER EXTREMITY ROM:     Active  Right eval Left eval  Hip flexion    Hip extension    Hip abduction    Hip adduction    Hip internal rotation    Hip external rotation    Knee flexion    Knee extension    Ankle dorsiflexion    Ankle  plantarflexion    Ankle inversion    Ankle eversion     (Blank rows = not tested)  LOWER EXTREMITY MMT:    MMT Right eval Left eval  Hip flexion 4 3+, pain  Hip extension 4 3+  Hip abduction 4 4  Hip adduction    Hip internal rotation    Hip external rotation    Knee flexion    Knee extension    Ankle dorsiflexion    Ankle plantarflexion    Ankle inversion    Ankle eversion     (Blank rows = not tested)  LUMBAR SPECIAL TESTS:  Straight leg raise test: Negative and Slump test: Positive, left side  FUNCTIONAL TESTS:  5 times sit to stand: 15.26, increased pain on left 2 minute walk test: 485, no increased back pain  GAIT: Distance walked: 485 Assistive device utilized: None Level of assistance: Complete Independence Comments: decreased stride length on LLE, slight antalgic gait, more pronounced as test progresses  TREATMENT DATE:  04/04/2024  Evaluation: -ROM measured, Strength assessed, HEP prescribed, pt educated on prognosis, findings, and importance of HEP compliance if given.  PATIENT EDUCATION:  Education details: Pt was educated on findings of PT evaluation, prognosis, frequency of therapy visits and rationale, attendance policy, and HEP if given.   Person educated: Patient Education method: Explanation, Verbal cues, and Handouts Education comprehension: verbalized understanding, verbal cues required, and needs further education  HOME EXERCISE PROGRAM: Access Code: [email or web address] URL: [email or web address] Date: 04/05/2024 Prepared by: Lang Ada  Exercises - Supine Bridge  - 1 x daily - 7 x weekly - 3 sets - 10 reps - 3 hold - Supine Lower Trunk Rotation  - 1 x daily - 7 x weekly - 3 sets - 10 reps - Supine Posterior Pelvic Tilt  - 1 x daily - 7 x weekly - 3 sets - 10 reps - 3 hold  ASSESSMENT:  CLINICAL  IMPRESSION: Patient is a 73 y.o. male who was seen today for physical therapy evaluation and treatment for M54.50 (ICD-10-CM) - Bilateral low back pain without sciatica, unspecified chronicity.   Patient demonstrates increased low back pain, decreased LE/core strength, abnormal pain with lumbar spine AROM, and impaired functional mobility. Patient also demonstrates difficulty with ambulation during today's session with antalgic pattern, decreased left stride length, and velocity noted. Patient also demonstrates difficulty standing from lying or sitting for the first time, increased time required. Pt also presents with forward head posture, forward shoulders and increased trunk flexion with walking and sitting. Patient requires education on role of PT, importance of physical activity and HEP compliance as well as therapeutic activities that will be used. Patient would benefit from skilled physical therapy for decreased low back pain, increased endurance with ambulation, increased core/LE strength, and functional mobility for improved gait quality, return to higher level of function with ADLs, and progress towards therapy goals.   OBJECTIVE IMPAIRMENTS: Abnormal gait, decreased activity tolerance, decreased balance, decreased endurance, decreased knowledge of use of DME, decreased mobility, difficulty walking, decreased ROM, decreased strength, hypomobility, impaired flexibility, postural dysfunction, and pain.   ACTIVITY LIMITATIONS: carrying, lifting, bending, standing, squatting, transfers, and bed mobility  PARTICIPATION LIMITATIONS: meal prep, cleaning, laundry, driving, shopping, community activity, occupation, and yard work  PERSONAL FACTORS: Age, Past/current experiences, Time since onset of injury/illness/exacerbation, and 1 comorbidity: Hx of low back surgery are also affecting patient's functional outcome.   REHAB POTENTIAL: Fair chronic in nature  CLINICAL DECISION MAKING:  Stable/uncomplicated  EVALUATION COMPLEXITY: Low   GOALS: Goals reviewed with patient? No  SHORT TERM GOALS: Target date: 04/26/24  Pt will be independent with HEP in order to demonstrate participation in Physical Therapy POC.  Baseline: Goal status: INITIAL  2.  Pt will report 3/10 pain with mobility in order to demonstrate improved pain with ADLs.  Baseline: 5/10 Goal status: INITIAL  LONG TERM GOALS: Target date: 05/17/24  Pt will improve 5TSTS by at least 2.5 seconds in order to demonstrate improved functional strength to return to desired activities.  Baseline: see objective.  Goal status: INITIAL  2.  Pt will improve 2 MWT by 40 feet in order to demonstrate improved functional ambulatory capacity in community setting.  Baseline: see objective.  Goal status: INITIAL  3.  Pt will improve Modified Oswestry score by at least 6 points in order to demonstrate improved pain with functional goals and outcomes. Baseline: see objective.  Goal status: INITIAL  4.  Pt will report 2/10 pain with mobility in order to demonstrate reduced pain with ADLs lasting greater than 30 minutes.  Baseline: see objective.  Goal status: INITIAL   PLAN:  PT FREQUENCY:  1-2x/week  PT DURATION: 6 weeks  PLANNED INTERVENTIONS: 97110-Therapeutic exercises, 97530- Therapeutic activity, [id number]- Neuromuscular re-education, 97535- Self Care, [id number]- Manual therapy, [phone]- Gait training, [phone]- Electrical stimulation (unattended), [phone]- Electrical stimulation (manual), [phone] (1-2 muscles), [id number] (3+ muscles)- Dry Needling, Patient/Family education, Balance training, Stair training, Spinal manipulation, Spinal mobilization, DME instructions, Cryotherapy, and Moist heat.  PLAN FOR NEXT SESSION: review HEP and goals, progress core and LE strengthening as well as HEP as appropriate   Lang Ada, PT, DPT [facility] Office: [phone] 11:43 AM, 04/05/24  [employer] Auth  Request Treatment Start Date: 04/05/24  Referring diagnosis code (ICD 10)? M54.50 (ICD-10-CM) - Bilateral low back pain without sciatica, unspecified chronicity Treatment diagnosis codes (ICD 10)? (if different than referring diagnosis) M54.59; R29.898; Z74.09 What was this (referring dx) caused by? []  Surgery []  Fall [x]  Ongoing issue []  Arthritis []  Other: ____________  Laterality: []  Rt []  Lt [x]  Both  Deficits: [x]  Pain [x]  Stiffness [x]  Weakness []  Edema []  Balance Deficits []  Coordination []  Gait Disturbance [x]  ROM []  Other   Functional Tool Score: Modified Oswestry: 19 / 50 = 38.0 %  CPT codes: See Planned Interventions listed in the Plan section of the Evaluation.

## 2024-04-05 ENCOUNTER — Encounter (HOSPITAL_COMMUNITY): Payer: Self-pay

## 2024-04-05 ENCOUNTER — Other Ambulatory Visit: Payer: Self-pay

## 2024-04-05 ENCOUNTER — Ambulatory Visit (HOSPITAL_COMMUNITY): Attending: Family Medicine

## 2024-04-05 DIAGNOSIS — M545 Low back pain, unspecified: Secondary | ICD-10-CM | POA: Insufficient documentation

## 2024-04-05 DIAGNOSIS — Z7409 Other reduced mobility: Secondary | ICD-10-CM | POA: Diagnosis not present

## 2024-04-05 DIAGNOSIS — M5459 Other low back pain: Secondary | ICD-10-CM | POA: Insufficient documentation

## 2024-04-05 DIAGNOSIS — R29898 Other symptoms and signs involving the musculoskeletal system: Secondary | ICD-10-CM | POA: Diagnosis not present

## 2024-04-07 ENCOUNTER — Encounter (HOSPITAL_COMMUNITY): Payer: Self-pay

## 2024-04-07 ENCOUNTER — Ambulatory Visit (HOSPITAL_COMMUNITY)

## 2024-04-07 DIAGNOSIS — R29898 Other symptoms and signs involving the musculoskeletal system: Secondary | ICD-10-CM

## 2024-04-07 DIAGNOSIS — M545 Low back pain, unspecified: Secondary | ICD-10-CM | POA: Diagnosis not present

## 2024-04-07 DIAGNOSIS — M5459 Other low back pain: Secondary | ICD-10-CM

## 2024-04-07 DIAGNOSIS — Z7409 Other reduced mobility: Secondary | ICD-10-CM

## 2024-04-07 NOTE — Therapy (Signed)
 OUTPATIENT PHYSICAL THERAPY THORACOLUMBAR TREATMENT   Patient Name: Adam [name] MRN: [id number] DOB:1950-12-06, 73 y.o., male Today's Date: 04/07/2024  END OF SESSION:  PT End of Session - 04/07/24 0943     Visit Number 2    Number of Visits 12    Date for Recertification  05/17/24    Authorization Type [employer]    Authorization Time Period cohere approved 12 visits from 04/05/24-07/04/2024    Authorization - Visit Number 2    Authorization - Number of Visits 12    Progress Note Due on Visit 10    PT Start Time 0949    PT Stop Time 1030    PT Time Calculation (min) 41 min    Activity Tolerance Patient tolerated treatment well;Patient limited by pain;No increased pain    Behavior During Therapy [facility] for tasks assessed/performed          Past Medical History:  Diagnosis Date   Allergy    BMI 31.0-31.9,adult JUNE 2011 180 LBS    Chronic back pain    Depression    Eosinophilic granuloma (HCC) JAN 2011 GASTRIC, followed by Dr. Cathern   EGD JAN 2011, JUN 2011, NOV 2011   Gastric ulcer 08/31/2012   GERD (gastroesophageal reflux disease)    HTN (hypertension)    Hyperlipemia    IBS (irritable bowel syndrome)    Prostate hypertrophy    Past Surgical History:  Procedure Laterality Date   BACK SURGERY     COLONOSCOPY  JAN 2011 COMPLETE   hypoTN and bradycardia w/ Demerol  ,Phenergan and Versed , [place] TICS, SML IH   COLONOSCOPY  2003   NUR-normal   COLONOSCOPY  06/21/09   Fields-sigmoid diverticulosis, sm int hemorrhoids   COLONOSCOPY N/A 10/24/2013   Normal mucosa in the terminal ileum Moderate diverticulosis noted in the sigmoid colonModerate sized internal hemorrhoids. negative random colon bx.. next TCS 10/2023   COLONOSCOPY WITH PROPOFOL  N/A 08/23/2023   Procedure: COLONOSCOPY WITH PROPOFOL ;  Surgeon: Cindie Carlin POUR, DO;  [facility];  Service: Endoscopy;  Laterality: N/A;  9:30 am, asa 3   Cyst Removed     from Left leg and knee    ESOPHAGOGASTRODUODENOSCOPY  05/20/2012   SLF: MILD ESOPHAGITIS.NO BARRETT'S/Multiple ulcers ranging between 3-5 mm/MILD Duodenal inflammation was found in the duodenal bulb   ESOPHAGOGASTRODUODENOSCOPY N/A 09/05/2012   SLF: MILD Non-erosive gastritis (inflammation) in the gastric antrum. Biopsies benign, no H. pylori.   ESOPHAGOGASTRODUODENOSCOPY N/A 10/24/2013   DOQ:ENDDPAOZ DISTAL ESOPHAGEAL WEBSmall hiatal herniaMILD Non-erosive gastritis. SB bx benign. gastric bx, minimal inflammation.    GANGLION CYST EXCISION     from L wrist and pins placed for Fx involving L thumb   HAND SURGERY  at the age of 3   Brother accidentally cut it   MALONEY DILATION N/A 10/24/2013   Procedure: AGAPITO DILATION;  Surgeon: Margo LITTIE Haddock, MD;  Location: [facility];  Service: Endoscopy;  Laterality: N/A;   MASS EXCISION  04/29/2012   Procedure: EXCISION MASS;  Surgeon: Thresa JAYSON Pulling, MD;  Location: [facility];  Service: General;  Laterality: Right;  Excision of Vascular Mass Right Arm   SAVORY DILATION N/A 10/24/2013   Procedure: SAVORY DILATION;  Surgeon: Margo LITTIE Haddock, MD;  Location: [facility];  Service: Endoscopy;  Laterality: N/A;   SHOULDER SURGERY Left 2017   TUMOR REMOVAL  1994   [facility]ign [facility] brain [facility]   UPPER GASTROINTESTINAL ENDOSCOPY  JAN 2011  GASTRIC ULCER-EOS GRANULOMA   UPPER GASTROINTESTINAL ENDOSCOPY  JUN 2011   GASTRIC ULCER- EOS, NO H. PYLORI   UPPER GASTROINTESTINAL ENDOSCOPY  NOV 2011   GASTRIC NODULE, no ulcer- EOS   XI ROBOTIC ASSISTED INGUINAL HERNIA REPAIR WITH MESH Right 09/25/2022   Procedure: XI ROBOTIC ASSISTED INGUINAL HERNIA REPAIR WITH MESH;  Surgeon: Evonnie Dorothyann LABOR, DO;  Location: [facility];  Service: General;  Laterality: Right;   Patient Active Problem List   Diagnosis Date Noted   Bilateral low back pain without sciatica 03/12/2024   Elevated PSA 11/23/2023   Chronic sinusitis 11/23/2023   Prediabetes 11/23/2023   Hemorrhoids 05/25/2023    Degeneration of intervertebral disc of lumbar region with discogenic back pain 05/25/2023   Proteinuria 01/12/2023   S/P inguinal hernia repair 10/01/2022   Vitamin D  deficiency 03/09/2022   OA (osteoarthritis) of knee 11/05/2021   Nonrheumatic aortic valve stenosis 08/28/2021   BPH (benign prostatic hyperplasia) 02/26/2021   Systolic murmur 02/26/2021   Carpal tunnel syndrome 01/12/2020   Depression 03/10/2016   Cervical disc disorder with radiculopathy of cervical region 10/31/2014   IBS (irritable bowel syndrome) 10/19/2013   Hyperlipidemia 09/20/2012   Essential hypertension 09/20/2012   GERD (gastroesophageal reflux disease) 12/30/2011   Eosinophilic gastritis 07/07/2011    PCP: Tobie Suzzane POUR, MD   REFERRING PROVIDER: Bluford Jacqulyn MATSU, DO  REFERRING DIAG: M54.50 (ICD-10-CM) - Bilateral low back pain without sciatica, unspecified chronicity  Rationale for Evaluation and Treatment: Rehabilitation  THERAPY DIAG:  Weakness of both lower extremities  Impaired functional mobility and activity tolerance  Other low back pain  ONSET DATE: 2-3 months  SUBJECTIVE:                                                                                                                                                                                           SUBJECTIVE STATEMENT: 04/07/24:  Pt reports constant nagging pain in lower back, pain scale 3/10.  Has began HEP wihtout questions.  Eval:  Pt states he had a back operation back in 2010. Pt states back pain does not go into the legs and stays in low back. Pt states the back bothers him the most after mowing the yard and bending over.   PERTINENT HISTORY:  Low back surgery about 15 years ago HTN  PAIN:  Are you having pain? Yes: NPRS scale: 5/10 Pain location: middle of low back Pain description: constant, ache Aggravating factors: bending, first getting up, riding lawn mower Relieving factors: exercise, pain  relievers  PRECAUTIONS: None  RED FLAGS: None   WEIGHT BEARING RESTRICTIONS: No  FALLS:  Has patient  fallen in last 6 months? 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  DIAGNOSTIC FINDINGS:  CLINICAL DATA:  Acute back pain.   EXAM: LUMBAR SPINE - 2-3 VIEW   COMPARISON:  01/21/2018.   FINDINGS: There is no evidence of acute lumbar spine fracture. There is mild anterolisthesis at C4-C5. Intervertebral disc space narrowing and degenerative endplate changes are noted at multiple levels and most pronounced at L4-L5. Mild facet arthropathy is noted.   IMPRESSION: 1. No acute fracture. 2. Multilevel degenerative disc disease and facet arthropathy. 3. Mild anterolisthesis at L4-L5, increased from 2019.  PATIENT SURVEYS:  Modified Oswestry: 19 / 50 = 38.0 %  COGNITION: Overall cognitive status: Within functional limits for tasks assessed     SENSATION: WFL   POSTURE: rounded shoulders, forward head, increased thoracic kyphosis, and flexed trunk   PALPATION: Pt demonstrates point tenderness to L2-L4 spinal segments, no tenderness noted with UPAs or palpation of sciatic nerve region bilaterally.  LUMBAR ROM:   AROM eval  Flexion 70, pain  Extension 20, worse pain  Right lateral flexion 25, pain  Left lateral flexion 12, worse pain  Right rotation 75% available  Left rotation 75% available   (Blank rows = not tested)  LOWER EXTREMITY ROM:     Active  Right eval Left eval  Hip flexion    Hip extension    Hip abduction     Hip adduction    Hip internal rotation    Hip external rotation    Knee flexion    Knee extension    Ankle dorsiflexion    Ankle plantarflexion    Ankle inversion    Ankle eversion     (Blank rows = not tested)  LOWER EXTREMITY MMT:    MMT Right eval Left eval  Hip flexion 4 3+, pain  Hip extension 4 3+  Hip abduction 4 4  Hip adduction    Hip internal rotation    Hip external rotation    Knee flexion    Knee extension    Ankle dorsiflexion    Ankle plantarflexion    Ankle inversion    Ankle eversion     (Blank rows = not tested)  LUMBAR SPECIAL TESTS:  Straight leg raise test: Negative and Slump test: Positive, left side  FUNCTIONAL TESTS:  5 times sit to stand: 15.26, increased pain on left 2 minute walk test: 485, no increased back pain  GAIT: Distance walked: 485 Assistive device utilized: None Level of assistance: Complete Independence Comments: decreased stride length on LLE, slight antalgic gait, more pronounced as test progresses  TREATMENT DATE:  04/07/24: Reviewed goals Educated importance of HEP compliance for maximal benefits Pt able to recall and demonstrate appropriate mechanics  Supine: -Bridge 10x 5 (cueing to widen stance) - LTR 10x 10 - Supine posterior pelvic tilt (exhale with excertion) Sidelying:  -Clam with RTB Prone hip extension 10x  Quadruped: cat/ cow (limited range) Seated:  - Postural awareness  - Lumbar support  - Wback  - Cervical retraction 10x 5 verbal and tactile cueing  - Rows with RTB 10x  -  STS no HHA eccentric control   04/04/2024  Evaluation: -ROM measured, Strength assessed, HEP prescribed, pt educated on prognosis, findings, and importance of HEP compliance if given.                                                                                                                                    PATIENT EDUCATION:  Education details: Pt was educated on findings of PT evaluation, prognosis,  frequency of therapy visits and rationale, attendance policy, and HEP if given.   Person educated: Patient Education method: Explanation, Verbal cues, and Handouts Education comprehension: verbalized understanding, verbal cues required, and needs further education  HOME EXERCISE PROGRAM: Access Code: [email or web address] URL: [email or web address] Date: 04/05/2024 Prepared by: Lang Ada  Exercises - Supine Bridge  - 1 x daily - 7 x weekly - 3 sets - 10 reps - 3 hold - Supine Lower Trunk Rotation  - 1 x daily - 7 x weekly - 3 sets - 10 reps - Supine Posterior Pelvic Tilt  - 1 x daily - 7 x weekly - 3 sets - 10 reps - 3 hold  - Correct Seated Posture  - 5 x daily - 7 x weekly - 1 sets - 1 reps - Seated Scapular Retraction with External Rotation  - 2 x daily - 7 x weekly - 2 sets - 10 reps - 5 hold - Seated Chin Tuck with Neck Elongation  - 2 x daily - 7 x weekly - 2 sets - 10 reps - 5 hold - Sit to Stand  - 2 x daily - 7 x weekly - 1 sets - 10 reps  ASSESSMENT:  CLINICAL IMPRESSION: 04/07/24: Reviewed goals and educated importance of HEP compliance for maximal benefits.  Pt able to recall and demonstrate appropriate mechanics with current exercise program.  Session focus with core and proximal strengthening.  Pt required some cueing to continue breathing during posterior pelvic tilts, encouraged to exhale with musculature contractions.  Pt educated importance of posture for pain control with additional postural strengthening exercises added to HEP.  No reports of increased pain through session.  Eval:  Patient is a 73 y.o. male who was seen today for physical therapy evaluation and treatment for M54.50 (ICD-10-CM) - Bilateral low back pain without sciatica, unspecified chronicity.   Patient demonstrates increased low back pain, decreased LE/core strength, abnormal pain with lumbar spine AROM, and impaired functional mobility. Patient also demonstrates difficulty with ambulation  during today's session with antalgic pattern, decreased left stride length, and velocity noted. Patient also demonstrates difficulty standing from lying or sitting for the first time, increased time required. Pt also presents with forward head posture, forward shoulders and increased trunk flexion with walking and sitting. Patient requires education on role of PT, importance of physical activity and HEP compliance as well as therapeutic activities that will be used. Patient would benefit from skilled physical therapy for decreased low back pain,  increased endurance with ambulation, increased core/LE strength, and functional mobility for improved gait quality, return to higher level of function with ADLs, and progress towards therapy goals.   OBJECTIVE IMPAIRMENTS: Abnormal gait, decreased activity tolerance, decreased balance, decreased endurance, decreased knowledge of use of DME, decreased mobility, difficulty walking, decreased ROM, decreased strength, hypomobility, impaired flexibility, postural dysfunction, and pain.   ACTIVITY LIMITATIONS: carrying, lifting, bending, standing, squatting, transfers, and bed mobility  PARTICIPATION LIMITATIONS: meal prep, cleaning, laundry, driving, shopping, community activity, occupation, and yard work  PERSONAL FACTORS: Age, Past/current experiences, Time since onset of injury/illness/exacerbation, and 1 comorbidity: Hx of low back surgery are also affecting patient's functional outcome.   REHAB POTENTIAL: Fair chronic in nature  CLINICAL DECISION MAKING: Stable/uncomplicated  EVALUATION COMPLEXITY: Low   GOALS: Goals reviewed with patient? No  SHORT TERM GOALS: Target date: 04/26/24  Pt will be independent with HEP in order to demonstrate participation in Physical Therapy POC.  Baseline: Goal status: INITIAL  2.  Pt will report 3/10 pain with mobility in order to demonstrate improved pain with ADLs.  Baseline: 5/10 Goal status: INITIAL  LONG  TERM GOALS: Target date: 05/17/24  Pt will improve 5TSTS by at least 2.5 seconds in order to demonstrate improved functional strength to return to desired activities.  Baseline: see objective.  Goal status: INITIAL  2.  Pt will improve 2 MWT by 40 feet in order to demonstrate improved functional ambulatory capacity in community setting.  Baseline: see objective.  Goal status: INITIAL  3.  Pt will improve Modified Oswestry score by at least 6 points in order to demonstrate improved pain with functional goals and outcomes. Baseline: see objective.  Goal status: INITIAL  4.  Pt will report 2/10 pain with mobility in order to demonstrate reduced pain with ADLs lasting greater than 30 minutes.  Baseline: see objective.  Goal status: INITIAL   PLAN:  PT FREQUENCY: 1-2x/week  PT DURATION: 6 weeks  PLANNED INTERVENTIONS: 97110-Therapeutic exercises, 97530- Therapeutic activity, [id number]- Neuromuscular re-education, 97535- Self Care, [id number]- Manual therapy, [phone]- Gait training, [phone]- Electrical stimulation (unattended), [phone]- Electrical stimulation (manual), [phone] (1-2 muscles), [phone] (3+ muscles)- Dry Needling, Patient/Family education, Balance training, Stair training, Spinal manipulation, Spinal mobilization, DME instructions, Cryotherapy, and Moist heat.  PLAN FOR NEXT SESSION:  progress core and LE strengthening as well as HEP as appropriate   Augustin Mclean, LPTA/CLT; CBIS [phone]  10:43 AM, 04/07/24

## 2024-04-11 ENCOUNTER — Ambulatory Visit (HOSPITAL_COMMUNITY)

## 2024-04-12 ENCOUNTER — Encounter (HOSPITAL_COMMUNITY): Payer: Self-pay

## 2024-04-12 ENCOUNTER — Ambulatory Visit (HOSPITAL_COMMUNITY): Attending: Family Medicine

## 2024-04-12 DIAGNOSIS — R29898 Other symptoms and signs involving the musculoskeletal system: Secondary | ICD-10-CM | POA: Diagnosis not present

## 2024-04-12 DIAGNOSIS — M5459 Other low back pain: Secondary | ICD-10-CM | POA: Insufficient documentation

## 2024-04-12 DIAGNOSIS — Z7409 Other reduced mobility: Secondary | ICD-10-CM | POA: Diagnosis not present

## 2024-04-12 NOTE — Therapy (Signed)
 OUTPATIENT PHYSICAL THERAPY THORACOLUMBAR TREATMENT   Patient Name: Adam [name] MRN: [id number] DOB:[birth year], 73 y.o., male Today's Date: 04/12/2024  END OF SESSION:  PT End of Session - 04/12/24 0729     Visit Number 3    Number of Visits 12    Date for Recertification  05/17/24    Authorization Type [employer]    Authorization Time Period cohere approved 12 visits from 04/05/24-07/04/2024    Authorization - Visit Number 2    Authorization - Number of Visits 12    Progress Note Due on Visit 10    PT Start Time 0730    PT Stop Time 0812    PT Time Calculation (min) 42 min    Activity Tolerance Patient tolerated treatment well;Patient limited by pain;No increased pain    Behavior During Therapy [facility] for tasks assessed/performed          Past Medical History:  Diagnosis Date   Allergy    BMI 31.0-31.9,adult JUNE 2011 180 LBS    Chronic back pain    Depression    Eosinophilic granuloma (HCC) JAN 2011 GASTRIC, followed by Dr. Cathern   EGD JAN 2011, JUN 2011, NOV 2011   Gastric ulcer 08/31/2012   GERD (gastroesophageal reflux disease)    HTN (hypertension)    Hyperlipemia    IBS (irritable bowel syndrome)    Prostate hypertrophy    Past Surgical History:  Procedure Laterality Date   BACK SURGERY     COLONOSCOPY  JAN 2011 COMPLETE   hypoTN and bradycardia w/ Demerol  ,Phenergan and Versed , [place] TICS, SML IH   COLONOSCOPY  2003   NUR-normal   COLONOSCOPY  06/21/09   Fields-sigmoid diverticulosis, sm int hemorrhoids   COLONOSCOPY N/A 10/24/2013   Normal mucosa in the terminal ileum Moderate diverticulosis noted in the sigmoid colonModerate sized internal hemorrhoids. negative random colon bx.. next TCS 10/2023   COLONOSCOPY WITH PROPOFOL  N/A 08/23/2023   Procedure: COLONOSCOPY WITH PROPOFOL ;  Surgeon: Cindie Carlin POUR, DO;  [facility];  Service: Endoscopy;  Laterality: N/A;  9:30 am, asa 3   Cyst Removed     from Left leg and knee    ESOPHAGOGASTRODUODENOSCOPY  05/20/2012   SLF: MILD ESOPHAGITIS.NO BARRETT'S/Multiple ulcers ranging between 3-5 mm/MILD Duodenal inflammation was found in the duodenal bulb   ESOPHAGOGASTRODUODENOSCOPY N/A 09/05/2012   SLF: MILD Non-erosive gastritis (inflammation) in the gastric antrum. Biopsies benign, no H. pylori.   ESOPHAGOGASTRODUODENOSCOPY N/A 10/24/2013   DOQ:ENDDPAOZ DISTAL ESOPHAGEAL WEBSmall hiatal herniaMILD Non-erosive gastritis. SB bx benign. gastric bx, minimal inflammation.    GANGLION CYST EXCISION     from L wrist and pins placed for Fx involving L thumb   HAND SURGERY  at the age of 3   Brother accidentally cut it   MALONEY DILATION N/A 10/24/2013   Procedure: AGAPITO DILATION;  Surgeon: Margo LITTIE Haddock, MD;  Location: [facility];  Service: Endoscopy;  Laterality: N/A;   MASS EXCISION  04/29/2012   Procedure: EXCISION MASS;  Surgeon: Thresa JAYSON Pulling, MD;  Location: [facility];  Service: General;  Laterality: Right;  Excision of Vascular Mass Right Arm   SAVORY DILATION N/A 10/24/2013   Procedure: SAVORY DILATION;  Surgeon: Margo LITTIE Haddock, MD;  Location: [facility];  Service: Endoscopy;  Laterality: N/A;   SHOULDER SURGERY Left 2017   TUMOR REMOVAL  1994   [facility]   UPPER GASTROINTESTINAL ENDOSCOPY  JAN 2011  GASTRIC ULCER-EOS GRANULOMA   UPPER GASTROINTESTINAL ENDOSCOPY  JUN 2011   GASTRIC ULCER- EOS, NO H. PYLORI   UPPER GASTROINTESTINAL ENDOSCOPY  NOV 2011   GASTRIC NODULE, no ulcer- EOS   XI ROBOTIC ASSISTED INGUINAL HERNIA REPAIR WITH MESH Right 09/25/2022   Procedure: XI ROBOTIC ASSISTED INGUINAL HERNIA REPAIR WITH MESH;  Surgeon: Evonnie Dorothyann LABOR, DO;  Location: [facility];  Service: General;  Laterality: Right;   Patient Active Problem List   Diagnosis Date Noted   Bilateral low back pain without sciatica 03/12/2024   Elevated PSA 11/23/2023   Chronic sinusitis 11/23/2023   Prediabetes 11/23/2023   Hemorrhoids 05/25/2023    Degeneration of intervertebral disc of lumbar region with discogenic back pain 05/25/2023   Proteinuria 01/12/2023   S/P inguinal hernia repair 10/01/2022   Vitamin D  deficiency 03/09/2022   OA (osteoarthritis) of knee 11/05/2021   Nonrheumatic aortic valve stenosis 08/28/2021   BPH (benign prostatic hyperplasia) 02/26/2021   Systolic murmur 02/26/2021   Carpal tunnel syndrome 01/12/2020   Depression 03/10/2016   Cervical disc disorder with radiculopathy of cervical region 10/31/2014   IBS (irritable bowel syndrome) 10/19/2013   Hyperlipidemia 09/20/2012   Essential hypertension 09/20/2012   GERD (gastroesophageal reflux disease) 12/30/2011   Eosinophilic gastritis 07/07/2011    PCP: Tobie Suzzane POUR, MD   REFERRING PROVIDER: Bluford Jacqulyn MATSU, DO  REFERRING DIAG: M54.50 (ICD-10-CM) - Bilateral low back pain without sciatica, unspecified chronicity  Rationale for Evaluation and Treatment: Rehabilitation  THERAPY DIAG:  Weakness of both lower extremities  Impaired functional mobility and activity tolerance  Other low back pain  ONSET DATE: 2-3 months  SUBJECTIVE:                                                                                                                                                                                           SUBJECTIVE STATEMENT: 04/12/24:  Reports he is feeling good today, exercises are going well at home.  No reports of pain today.  Stated most difficulty getting in and out of car with low surface.  Eval:  Pt states he had a back operation back in 2010. Pt states back pain does not go into the legs and stays in low back. Pt states the back bothers him the most after mowing the yard and bending over.   PERTINENT HISTORY:  Low back surgery about 15 years ago HTN  PAIN:  Are you having pain? Yes: NPRS scale: 5/10 Pain location: middle of low back Pain description: constant, ache Aggravating factors: bending, first getting up, riding  lawn mower Relieving factors: exercise, pain relievers  PRECAUTIONS: None  RED FLAGS: None   WEIGHT BEARING RESTRICTIONS: No  FALLS:  Has patient fallen in last 6 months? 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  DIAGNOSTIC FINDINGS:  CLINICAL DATA:  Acute back pain.   EXAM: LUMBAR SPINE - 2-3 VIEW   COMPARISON:  01/21/2018.   FINDINGS: There is no evidence of acute lumbar spine fracture. There is mild anterolisthesis at C4-C5. Intervertebral disc space narrowing and degenerative endplate changes are noted at multiple levels and most pronounced at L4-L5. Mild facet arthropathy is noted.   IMPRESSION: 1. No acute fracture. 2. Multilevel degenerative disc disease and facet arthropathy. 3. Mild anterolisthesis at L4-L5, increased from 2019.  PATIENT SURVEYS:  Modified Oswestry: 19 / 50 = 38.0 %  COGNITION: Overall cognitive status: Within functional limits for tasks assessed     SENSATION: WFL   POSTURE: rounded shoulders, forward head, increased thoracic kyphosis, and flexed trunk   PALPATION: Pt demonstrates point tenderness to L2-L4 spinal segments, no tenderness noted with UPAs or palpation of sciatic nerve region bilaterally.  LUMBAR ROM:   AROM eval  Flexion 70, pain  Extension 20, worse pain  Right lateral flexion 25, pain  Left lateral flexion 12, worse pain  Right rotation 75% available  Left rotation 75% available   (Blank rows = not tested)  LOWER EXTREMITY ROM:     Active  Right eval Left eval   Hip flexion    Hip extension    Hip abduction    Hip adduction    Hip internal rotation    Hip external rotation    Knee flexion    Knee extension    Ankle dorsiflexion    Ankle plantarflexion    Ankle inversion    Ankle eversion     (Blank rows = not tested)  LOWER EXTREMITY MMT:    MMT Right eval Left eval  Hip flexion 4 3+, pain  Hip extension 4 3+  Hip abduction 4 4  Hip adduction    Hip internal rotation    Hip external rotation    Knee flexion    Knee extension    Ankle dorsiflexion    Ankle plantarflexion    Ankle inversion    Ankle eversion     (Blank rows = not tested)  LUMBAR SPECIAL TESTS:  Straight leg raise test: Negative and Slump test: Positive, left side  FUNCTIONAL TESTS:  5 times sit to stand: 15.26, increased pain on left 2 minute walk test: 485, no increased back pain  GAIT: Distance walked: 485 Assistive device utilized: None Level of assistance: Complete Independence Comments: decreased stride length on LLE, slight antalgic gait, more pronounced as test progresses  TREATMENT DATE:  04/12/24: - Postural strengthening with RTB - shoulder extension 10 - rows 10 - Marching with intermittent HHA - SLS Rt 22, Lt 33 - Sidestep with RTB around thigh 3RT - STS eccentric control down, power up 10x - Squat front of chair 10x - Tandem stance 2x 30 on foam 2x 30 intermittent HHA - Lunges on 6in step no HHA - Leg press 4Pl 10x 2 sets -  6in then 12in hurdles paired with STS from 18in step (like getting in and out of car)  04/07/24: Reviewed goals Educated importance of HEP compliance for maximal benefits Pt able to recall and demonstrate appropriate mechanics  Supine: -Bridge 10x 5 (cueing to widen stance) - LTR 10x 10 - Supine posterior pelvic tilt (exhale with excertion) Sidelying:  -Clam with RTB Prone hip extension 10x  Quadruped: cat/ cow (limited range) Seated:  - Postural awareness  - Lumbar support  - Wback  -  Cervical retraction 10x 5 verbal and tactile cueing  - Rows with RTB 10x  - STS no HHA eccentric control   04/04/2024  Evaluation: -ROM measured, Strength assessed, HEP prescribed, pt educated on prognosis, findings, and importance of HEP compliance if given.                                                                                                                                    PATIENT EDUCATION:  Education details: Pt was educated on findings of PT evaluation, prognosis, frequency of therapy visits and rationale, attendance policy, and HEP if given.   Person educated: Patient Education method: Explanation, Verbal cues, and Handouts Education comprehension: verbalized understanding, verbal cues required, and needs further education  HOME EXERCISE PROGRAM: Access Code: [email or web address] URL: [email or web address] Date: 04/05/2024 Prepared by: Lang Ada  Exercises - Supine Bridge  - 1 x daily - 7 x weekly - 3 sets - 10 reps - 3 hold - Supine Lower Trunk Rotation  - 1 x daily - 7 x weekly - 3 sets - 10 reps - Supine Posterior Pelvic Tilt  - 1 x daily - 7 x weekly - 3 sets - 10 reps - 3 hold  - Correct Seated Posture  - 5 x daily - 7 x weekly - 1 sets - 1 reps - Seated Scapular Retraction with External Rotation  - 2 x daily - 7 x weekly - 2 sets - 10 reps - 5 hold - Seated Chin Tuck with Neck Elongation  - 2 x daily - 7 x weekly - 2 sets - 10 reps - 5 hold - Sit to Stand  - 2 x daily - 7 x weekly - 1 sets - 10 reps  ASSESSMENT:  CLINICAL IMPRESSION: 04/12/24:  Session focus with core and proximal strengthening.  Progressed to standing exercises with additional postural, functional strengthening and balance activities.  Pt tolerated well to all exercises with no reports of increased pain.  Presents with gluteal weakness noted with increased difficulty with lower surface STS.  Incorporated exercises to improve mechanics getting in and out of low surface vehicles  as reports this is difficulty.  Eval:  Patient is a 73 y.o. male who was seen today for physical therapy evaluation and treatment for M54.50 (ICD-10-CM) - Bilateral low back pain without sciatica, unspecified chronicity.   Patient demonstrates increased low back pain, decreased LE/core strength, abnormal pain with  lumbar spine AROM, and impaired functional mobility. Patient also demonstrates difficulty with ambulation during today's session with antalgic pattern, decreased left stride length, and velocity noted. Patient also demonstrates difficulty standing from lying or sitting for the first time, increased time required. Pt also presents with forward head posture, forward shoulders and increased trunk flexion with walking and sitting. Patient requires education on role of PT, importance of physical activity and HEP compliance as well as therapeutic activities that will be used. Patient would benefit from skilled physical therapy for decreased low back pain, increased endurance with ambulation, increased core/LE strength, and functional mobility for improved gait quality, return to higher level of function with ADLs, and progress towards therapy goals.   OBJECTIVE IMPAIRMENTS: Abnormal gait, decreased activity tolerance, decreased balance, decreased endurance, decreased knowledge of use of DME, decreased mobility, difficulty walking, decreased ROM, decreased strength, hypomobility, impaired flexibility, postural dysfunction, and pain.   ACTIVITY LIMITATIONS: carrying, lifting, bending, standing, squatting, transfers, and bed mobility  PARTICIPATION LIMITATIONS: meal prep, cleaning, laundry, driving, shopping, community activity, occupation, and yard work  PERSONAL FACTORS: Age, Past/current experiences, Time since onset of injury/illness/exacerbation, and 1 comorbidity: Hx of low back surgery are also affecting patient's functional outcome.   REHAB POTENTIAL: Fair chronic in nature  CLINICAL  DECISION MAKING: Stable/uncomplicated  EVALUATION COMPLEXITY: Low   GOALS: Goals reviewed with patient? No  SHORT TERM GOALS: Target date: 04/26/24  Pt will be independent with HEP in order to demonstrate participation in Physical Therapy POC.  Baseline: Goal status: INITIAL  2.  Pt will report 3/10 pain with mobility in order to demonstrate improved pain with ADLs.  Baseline: 5/10 Goal status: INITIAL  LONG TERM GOALS: Target date: 05/17/24  Pt will improve 5TSTS by at least 2.5 seconds in order to demonstrate improved functional strength to return to desired activities.  Baseline: see objective.  Goal status: INITIAL  2.  Pt will improve 2 MWT by 40 feet in order to demonstrate improved functional ambulatory capacity in community setting.  Baseline: see objective.  Goal status: INITIAL  3.  Pt will improve Modified Oswestry score by at least 6 points in order to demonstrate improved pain with functional goals and outcomes. Baseline: see objective.  Goal status: INITIAL  4.  Pt will report 2/10 pain with mobility in order to demonstrate reduced pain with ADLs lasting greater than 30 minutes.  Baseline: see objective.  Goal status: INITIAL   PLAN:  PT FREQUENCY: 1-2x/week  PT DURATION: 6 weeks  PLANNED INTERVENTIONS: 97110-Therapeutic exercises, 97530- Therapeutic activity, [id number]- Neuromuscular re-education, 97535- Self Care, [id number]- Manual therapy, [phone]- Gait training, [phone]- Electrical stimulation (unattended), [phone]- Electrical stimulation (manual), [phone] (1-2 muscles), [phone] (3+ muscles)- Dry Needling, Patient/Family education, Balance training, Stair training, Spinal manipulation, Spinal mobilization, DME instructions, Cryotherapy, and Moist heat.  PLAN FOR NEXT SESSION:  progress core and LE strengthening as well as HEP as appropriate.  Vector stance, rebounder, STS with one foot on foam/1 on floor.   Augustin Mclean, LPTA/CLT; CBIS [phone]  9:58 AM,  04/12/24

## 2024-04-18 ENCOUNTER — Ambulatory Visit (HOSPITAL_COMMUNITY): Admitting: Physical Therapy

## 2024-04-18 DIAGNOSIS — Z7409 Other reduced mobility: Secondary | ICD-10-CM

## 2024-04-18 DIAGNOSIS — R29898 Other symptoms and signs involving the musculoskeletal system: Secondary | ICD-10-CM | POA: Diagnosis not present

## 2024-04-18 DIAGNOSIS — M5459 Other low back pain: Secondary | ICD-10-CM | POA: Diagnosis not present

## 2024-04-18 NOTE — Therapy (Signed)
 OUTPATIENT PHYSICAL THERAPY THORACOLUMBAR TREATMENT   Patient Name: Adam [name] MRN: [id number] DOB:[birth year], 73 y.o., male Today's Date: 04/18/2024  END OF SESSION:  PT End of Session - 04/18/24 0731     Visit Number 4    Number of Visits 12    Date for Recertification  05/17/24    Authorization Type [employer]    Authorization Time Period cohere approved 12 visits from 04/05/24-07/04/2024    Authorization - Number of Visits 12    Progress Note Due on Visit 10    PT Start Time 0731    PT Stop Time 0814    PT Time Calculation (min) 43 min    Activity Tolerance Patient tolerated treatment well;Patient limited by pain;No increased pain    Behavior During Therapy [facility] for tasks assessed/performed          Past Medical History:  Diagnosis Date   Allergy    BMI 31.0-31.9,adult JUNE 2011 180 LBS    Chronic back pain    Depression    Eosinophilic granuloma (HCC) JAN 2011 GASTRIC, followed by Dr. Cathern   EGD JAN 2011, JUN 2011, NOV 2011   Gastric ulcer 08/31/2012   GERD (gastroesophageal reflux disease)    HTN (hypertension)    Hyperlipemia    IBS (irritable bowel syndrome)    Prostate hypertrophy    Past Surgical History:  Procedure Laterality Date   BACK SURGERY     COLONOSCOPY  JAN 2011 COMPLETE   hypoTN and bradycardia w/ Demerol  ,Phenergan and Versed , Simpson TICS, SML IH   COLONOSCOPY  2003   NUR-normal   COLONOSCOPY  06/21/09   Fields-sigmoid diverticulosis, sm int hemorrhoids   COLONOSCOPY N/A 10/24/2013   Normal mucosa in the terminal ileum Moderate diverticulosis noted in the sigmoid colonModerate sized internal hemorrhoids. negative random colon bx.. next TCS 10/2023   COLONOSCOPY WITH PROPOFOL  N/A 08/23/2023   Procedure: COLONOSCOPY WITH PROPOFOL ;  Surgeon: Cindie Carlin POUR, DO;  [facility];  Service: Endoscopy;  Laterality: N/A;  9:30 am, asa 3   Cyst Removed     from Left leg and knee   ESOPHAGOGASTRODUODENOSCOPY  05/20/2012    SLF: MILD ESOPHAGITIS.NO BARRETT'S/Multiple ulcers ranging between 3-5 mm/MILD Duodenal inflammation was found in the duodenal bulb   ESOPHAGOGASTRODUODENOSCOPY N/A 09/05/2012   SLF: MILD Non-erosive gastritis (inflammation) in the gastric antrum. Biopsies benign, no H. pylori.   ESOPHAGOGASTRODUODENOSCOPY N/A 10/24/2013   DOQ:ENDDPAOZ DISTAL ESOPHAGEAL WEBSmall hiatal herniaMILD Non-erosive gastritis. SB bx benign. gastric bx, minimal inflammation.    GANGLION CYST EXCISION     from L wrist and pins placed for Fx involving L thumb   HAND SURGERY  at the age of 3   Brother accidentally cut it   MALONEY DILATION N/A 10/24/2013   Procedure: AGAPITO DILATION;  Surgeon: Margo LITTIE Haddock, MD;  [facility];  Service: Endoscopy;  Laterality: N/A;   MASS EXCISION  04/29/2012   Procedure: EXCISION MASS;  Surgeon: Thresa JAYSON Pulling, MD;  Location: [facility];  Service: General;  Laterality: Right;  Excision of Vascular Mass Right Arm   SAVORY DILATION N/A 10/24/2013   Procedure: SAVORY DILATION;  Surgeon: Margo LITTIE Haddock, MD;  [facility];  Service: Endoscopy;  Laterality: N/A;   SHOULDER SURGERY Left 2017   TUMOR REMOVAL  1994   Benign [facility]   UPPER GASTROINTESTINAL ENDOSCOPY  JAN 2011   GASTRIC ULCER-EOS GRANULOMA   UPPER GASTROINTESTINAL ENDOSCOPY  JUN 2011   GASTRIC ULCER- EOS, NO H. PYLORI   UPPER GASTROINTESTINAL ENDOSCOPY  NOV 2011   GASTRIC NODULE, no ulcer- EOS   XI ROBOTIC ASSISTED INGUINAL HERNIA REPAIR WITH MESH Right 09/25/2022   Procedure: XI ROBOTIC ASSISTED INGUINAL HERNIA REPAIR WITH MESH;  Surgeon: Evonnie Dorothyann LABOR, DO;  Location: [facility];  Service: General;  Laterality: Right;   Patient Active Problem List   Diagnosis Date Noted   Bilateral low back pain without sciatica 03/12/2024   Elevated PSA 11/23/2023   Chronic sinusitis 11/23/2023   Prediabetes 11/23/2023   Hemorrhoids 05/25/2023   Degeneration of intervertebral disc of lumbar  region with discogenic back pain 05/25/2023   Proteinuria 01/12/2023   S/P inguinal hernia repair 10/01/2022   Vitamin D  deficiency 03/09/2022   OA (osteoarthritis) of knee 11/05/2021   Nonrheumatic aortic valve stenosis 08/28/2021   BPH (benign prostatic hyperplasia) 02/26/2021   Systolic murmur 02/26/2021   Carpal tunnel syndrome 01/12/2020   Depression 03/10/2016   Cervical disc disorder with radiculopathy of cervical region 10/31/2014   IBS (irritable bowel syndrome) 10/19/2013   Hyperlipidemia 09/20/2012   Essential hypertension 09/20/2012   GERD (gastroesophageal reflux disease) 12/30/2011   Eosinophilic gastritis 07/07/2011    PCP: Tobie Suzzane POUR, MD   REFERRING PROVIDER: Bluford Jacqulyn MATSU, DO  REFERRING DIAG: M54.50 (ICD-10-CM) - Bilateral low back pain without sciatica, unspecified chronicity  Rationale for Evaluation and Treatment: Rehabilitation  THERAPY DIAG:  Weakness of both lower extremities  Impaired functional mobility and activity tolerance  Other low back pain  ONSET DATE: 2-3 months  SUBJECTIVE:                                                                                                                                                                                           SUBJECTIVE STATEMENT: 04/12/24:  Reports he is feeling good today, exercises are going well at home.  No reports of pain today.  Stated most difficulty getting in and out of car with low surface.  Eval:  Pt states he had a back operation back in 2010. Pt states back pain does not go into the legs and stays in low back. Pt states the back bothers him the most after mowing the yard and bending over.   PERTINENT HISTORY:  Low back surgery about 15 years ago HTN  PAIN:  Are you having pain? Yes: NPRS scale: 5/10 Pain location: middle of low back Pain description: constant, ache Aggravating factors: bending, first getting up, riding lawn mower Relieving factors: exercise, pain  relievers  PRECAUTIONS: None  RED FLAGS: None   WEIGHT BEARING RESTRICTIONS: No  FALLS:  Has patient fallen in last 6 months? 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  DIAGNOSTIC FINDINGS:  CLINICAL DATA:  Acute back pain.   EXAM: LUMBAR SPINE - 2-3 VIEW   COMPARISON:  01/21/2018.   FINDINGS: There is no evidence of acute lumbar spine fracture. There is mild anterolisthesis at C4-C5. Intervertebral disc space narrowing and degenerative endplate changes are noted at multiple levels and most pronounced at L4-L5. Mild facet arthropathy is noted.   IMPRESSION: 1. No acute fracture. 2. Multilevel degenerative disc disease and facet arthropathy. 3. Mild anterolisthesis at L4-L5, increased from 2019.  PATIENT SURVEYS:  Modified Oswestry: 19 / 50 = 38.0 %  COGNITION: Overall cognitive status: Within functional limits for tasks assessed     SENSATION: WFL   POSTURE: rounded shoulders, forward head, increased thoracic kyphosis, and flexed trunk   PALPATION: Pt demonstrates point tenderness to L2-L4 spinal segments, no tenderness noted with UPAs or palpation of sciatic nerve region bilaterally.  LUMBAR ROM:   AROM eval  Flexion 70, pain  Extension 20, worse pain  Right lateral flexion 25, pain  Left lateral flexion 12, worse pain  Right rotation 75% available  Left rotation 75% available   (Blank rows = not tested)  LOWER EXTREMITY ROM:     Active  Right eval Left eval  Hip flexion    Hip extension    Hip abduction     Hip adduction    Hip internal rotation    Hip external rotation    Knee flexion    Knee extension    Ankle dorsiflexion    Ankle plantarflexion    Ankle inversion    Ankle eversion     (Blank rows = not tested)  LOWER EXTREMITY MMT:    MMT Right eval Left eval  Hip flexion 4 3+, pain  Hip extension 4 3+  Hip abduction 4 4  Hip adduction    Hip internal rotation    Hip external rotation    Knee flexion    Knee extension    Ankle dorsiflexion    Ankle plantarflexion    Ankle inversion    Ankle eversion     (Blank rows = not tested)  LUMBAR SPECIAL TESTS:  Straight leg raise test: Negative and Slump test: Positive, left side  FUNCTIONAL TESTS:  5 times sit to stand: 15.26, increased pain on left 2 minute walk test: 485, no increased back pain  GAIT: Distance walked: 485 Assistive device utilized: None Level of assistance: Complete Independence Comments: decreased stride length on LLE, slight antalgic gait, more pronounced as test progresses  TREATMENT DATE:  04/18/24: Standing:  at // but without UE assist (unless stated otherwise) Alternating march 20X, high holds SLS on foam pad 30 each with intermittent HHA Vectors with 1 UE assist onto 4 box 2X10 Lunges onto 4 step 2X10 Sidestep on 20 ft line with GTB around thigh 3RT STS eccentric control down with use of tidal tank 10X Leg press 4Pl 10x 2 sets Postural strengthening with RTB shoulder extension 2X10 rows 2X10 Pallof press 2X10 each direction  04/12/24: - Postural strengthening with RTB - shoulder extension 10 - rows 10 - Marching with intermittent HHA - SLS Rt 22, Lt 33 - Sidestep with RTB around thigh 3RT - STS eccentric control down, power up 10x - Squat front of chair 10x - Tandem stance 2x 30 on foam 2x 30 intermittent HHA - Lunges on 6in step no HHA - Leg press 4Pl 10x 2 sets - 6in then 12in hurdles paired with STS from 18in step (like getting in and out of  car)  04/07/24: Reviewed goals Educated importance of HEP compliance for maximal benefits Pt able to recall and demonstrate appropriate mechanics  Supine: -Bridge 10x 5 (cueing to widen stance) - LTR 10x 10 - Supine posterior pelvic tilt (exhale with excertion) Sidelying:  -Clam with RTB Prone hip extension 10x  Quadruped: cat/ cow (limited range) Seated:  - Postural awareness  - Lumbar support  - Wback  - Cervical retraction 10x 5 verbal and tactile cueing  - Rows with RTB 10x  - STS no HHA eccentric control   04/04/2024  Evaluation: -ROM measured, Strength assessed, HEP prescribed, pt educated on prognosis, findings, and importance of HEP compliance if given.                                                                                                                                    PATIENT EDUCATION:  Education details: Pt was educated on findings of PT evaluation, prognosis, frequency of therapy visits and rationale, attendance policy, and HEP if given.   Person educated: Patient Education method: Explanation, Verbal cues, and Handouts Education comprehension: verbalized understanding, verbal cues required, and needs further education  HOME EXERCISE PROGRAM: Access Code: [email or web address] URL: [email or web address] Date: 04/05/2024 Prepared by: Lang Ada  Exercises - Supine Bridge  - 1 x daily - 7 x weekly - 3 sets - 10 reps - 3 hold - Supine Lower Trunk Rotation  - 1 x daily - 7 x weekly - 3 sets - 10 reps - Supine Posterior Pelvic Tilt  - 1 x daily - 7 x weekly - 3 sets - 10 reps - 3 hold  - Correct Seated Posture  - 5 x daily - 7 x weekly - 1 sets - 1 reps - Seated Scapular Retraction with External Rotation  - 2 x daily - 7 x weekly - 2 sets - 10 reps - 5 hold - Seated Chin Tuck with Neck Elongation  - 2 x daily - 7 x weekly - 2 sets - 10 reps - 5 hold - Sit to Stand  - 2 x daily - 7 x weekly - 1 sets - 10 reps  ASSESSMENT:  CLINICAL  IMPRESSION: 04/18/24:  continued with focus on LE strengthening and core stabilization.   Increased difficulty of SLS with use of foam pad with challenge noted but still presented with good balance.  Used green band with resisted side stepping to increase  difficulty.   Increased to 2 sets of established theraband exercises and also began pallof to challenge core stabilization.  Progressed to standing exercises with additional postural, functional strengthening and balance activities.  Pt tolerated all exercises well without c/o pain or issues. Pt will continue to benefit from skilled therapy.  OBJECTIVE IMPAIRMENTS: Abnormal gait, decreased activity tolerance, decreased balance, decreased endurance, decreased knowledge of use of DME, decreased mobility, difficulty walking, decreased ROM, decreased strength, hypomobility, impaired flexibility, postural dysfunction, and pain.   ACTIVITY LIMITATIONS: carrying, lifting, bending, standing, squatting, transfers, and bed mobility  PARTICIPATION LIMITATIONS: meal prep, cleaning, laundry, driving, shopping, community activity, occupation, and yard work  PERSONAL FACTORS: Age, Past/current experiences, Time since onset of injury/illness/exacerbation, and 1 comorbidity: Hx of low back surgery are also affecting patient's functional outcome.   REHAB POTENTIAL: Fair chronic in nature  CLINICAL DECISION MAKING: Stable/uncomplicated  EVALUATION COMPLEXITY: Low   GOALS: Goals reviewed with patient? No  SHORT TERM GOALS: Target date: 04/26/24  Pt will be independent with HEP in order to demonstrate participation in Physical Therapy POC.  Baseline: Goal status: INITIAL  2.  Pt will report 3/10 pain with mobility in order to demonstrate improved pain with ADLs.  Baseline: 5/10 Goal status: INITIAL  LONG TERM GOALS: Target date: 05/17/24  Pt will improve 5TSTS by at least 2.5 seconds in order to demonstrate improved functional strength to return to  desired activities.  Baseline: see objective.  Goal status: INITIAL  2.  Pt will improve 2 MWT by 40 feet in order to demonstrate improved functional ambulatory capacity in community setting.  Baseline: see objective.  Goal status: INITIAL  3.  Pt will improve Modified Oswestry score by at least 6 points in order to demonstrate improved pain with functional goals and outcomes. Baseline: see objective.  Goal status: INITIAL  4.  Pt will report 2/10 pain with mobility in order to demonstrate reduced pain with ADLs lasting greater than 30 minutes.  Baseline: see objective.  Goal status: INITIAL   PLAN:  PT FREQUENCY: 1-2x/week  PT DURATION: 6 weeks  PLANNED INTERVENTIONS: 97110-Therapeutic exercises, 97530- Therapeutic activity, [id number]- Neuromuscular re-education, 97535- Self Care, [id number]- Manual therapy, [phone]- Gait training, [phone]- Electrical stimulation (unattended), [phone]- Electrical stimulation (manual), [phone] (1-2 muscles), [phone] (3+ muscles)- Dry Needling, Patient/Family education, Balance training, Stair training, Spinal manipulation, Spinal mobilization, DME instructions, Cryotherapy, and Moist heat.  PLAN FOR NEXT SESSION:  progress core and LE strengthening as well as HEP as appropriate.    Greig KATHEE Fuse, PTA/CLT [facility] Ph: [phone]  7:33 AM, 04/18/24

## 2024-04-19 ENCOUNTER — Encounter (HOSPITAL_COMMUNITY): Payer: Self-pay

## 2024-04-19 ENCOUNTER — Ambulatory Visit (HOSPITAL_COMMUNITY)

## 2024-04-19 ENCOUNTER — Other Ambulatory Visit: Payer: Self-pay

## 2024-04-19 ENCOUNTER — Ambulatory Visit (INDEPENDENT_AMBULATORY_CARE_PROVIDER_SITE_OTHER)

## 2024-04-19 DIAGNOSIS — Z23 Encounter for immunization: Secondary | ICD-10-CM | POA: Diagnosis not present

## 2024-04-19 DIAGNOSIS — R29898 Other symptoms and signs involving the musculoskeletal system: Secondary | ICD-10-CM | POA: Diagnosis not present

## 2024-04-19 DIAGNOSIS — M5459 Other low back pain: Secondary | ICD-10-CM | POA: Diagnosis not present

## 2024-04-19 DIAGNOSIS — Z7409 Other reduced mobility: Secondary | ICD-10-CM | POA: Diagnosis not present

## 2024-04-19 NOTE — Progress Notes (Signed)
 Patient is in office today for a nurse visit for Immunization. Patient Injection was given in the  Left deltoid. Patient tolerated injection well.

## 2024-04-19 NOTE — Therapy (Signed)
 OUTPATIENT PHYSICAL THERAPY THORACOLUMBAR TREATMENT   Patient Name: Adam [name] MRN: [id number] DOB:1951-03-11, 73 y.o., male Today's Date: 04/19/2024  END OF SESSION:  PT End of Session - 04/19/24 0726     Visit Number 5    Number of Visits 12    Date for Recertification  05/17/24    Authorization Type [employer]    Authorization Time Period cohere approved 12 visits from 04/05/24-07/04/2024    Authorization - Visit Number 4    Authorization - Number of Visits 12    Progress Note Due on Visit 10    PT Start Time 0728    PT Stop Time 0810    PT Time Calculation (min) 42 min    Activity Tolerance Patient tolerated treatment well;No increased pain    Behavior During Therapy [facility] for tasks assessed/performed          Past Medical History:  Diagnosis Date   Allergy    BMI 31.0-31.9,adult JUNE 2011 180 LBS    Chronic back pain    Depression    Eosinophilic granuloma (HCC) JAN 2011 GASTRIC, followed by Dr. Cathern   EGD JAN 2011, JUN 2011, NOV 2011   Gastric ulcer 08/31/2012   GERD (gastroesophageal reflux disease)    HTN (hypertension)    Hyperlipemia    IBS (irritable bowel syndrome)    Prostate hypertrophy    Past Surgical History:  Procedure Laterality Date   BACK SURGERY     COLONOSCOPY  JAN 2011 COMPLETE   hypoTN and bradycardia w/ Demerol  ,Phenergan and Versed , [place] T[facility], SML IH   COLONOSCOPY  2003   NUR-normal   COLONOSCOPY  06/21/09   Fields-sigmoid diverticulosis, sm int hemorrhoids   COLONOSCOPY N/A 10/24/2013   Normal mucosa in the terminal ileum Moderate diverticulosis noted in the sigmoid colonModerate sized internal hemorrhoids. negative random colon bx.. next TCS 10/2023   COLONOSCOPY WITH PROPOFOL  N/A 08/23/2023   Procedure: COLONOSCOPY WITH PROPOFOL ;  Surgeon: Cindie Carlin POUR, DO;  [facility];  Service: Endoscopy;  Laterality: N/A;  9:30 am, asa 3   Cyst Removed     from Left leg and knee   ESOPHAGOGASTRODUODENOSCOPY   05/20/2012   SLF: MILD ESOPHAGITIS.NO BARRETT'S/Multiple ulcers ranging between 3-5 mm/MILD Duodenal inflammation was found in the duodenal bulb   ESOPHAGOGASTRODUODENOSCOPY N/A 09/05/2012   SLF: MILD Non-erosive gastritis (inflammation) in the gastric antrum. Biopsies benign, no H. pylori.   ESOPHAGOGASTRODUODENOSCOPY N/A 10/24/2013   DOQ:ENDDPAOZ DISTAL ESOPHAGEAL WEBSmall hiatal herniaMILD Non-erosive gastritis. SB bx benign. gastric bx, minimal inflammation.    GANGLION CYST EXCISION     from L wrist and pins placed for Fx involving L thumb   HAND SURGERY  at the age of 3   Brother accidentally cut it   MALONEY DILATION N/A 10/24/2013   Procedure: AGAPITO DILATION;  Surgeon: Margo LITTIE Haddock, MD;  Location: [facility];  Service: Endoscopy;  Laterality: N/A;   MASS EXCISION  04/29/2012   Procedure: EXCISION MASS;  Surgeon: Thresa JAYSON Pulling, MD;  Location: [facility];  Service: General;  Laterality: Right;  Excision of Vascular Mass Right Arm   SAVORY DILATION N/A 10/24/2013   Procedure: SAVORY DILATION;  Surgeon: Margo LITTIE Haddock, MD;  Location: [facility];  Service: Endoscopy;  Laterality: N/A;   SHOULDER SURGERY Left 2017   TUMOR REMOVAL  1994   [facility]   UPPER GASTROINTESTINAL ENDOSCOPY  JAN 2011   GASTRIC ULCER-EOS GRANULOMA  UPPER GASTROINTESTINAL ENDOSCOPY  JUN 2011   GASTRIC ULCER- EOS, NO H. PYLORI   UPPER GASTROINTESTINAL ENDOSCOPY  NOV 2011   GASTRIC NODULE, no ulcer- EOS   XI ROBOTIC ASSISTED INGUINAL HERNIA REPAIR WITH MESH Right 09/25/2022   Procedure: XI ROBOTIC ASSISTED INGUINAL HERNIA REPAIR WITH MESH;  Surgeon: Evonnie Dorothyann LABOR, DO;  Location: [facility];  Service: General;  Laterality: Right;   Patient Active Problem List   Diagnosis Date Noted   Bilateral low back pain without sciatica 03/12/2024   Elevated PSA 11/23/2023   Chronic sinusitis 11/23/2023   Prediabetes 11/23/2023   Hemorrhoids 05/25/2023   Degeneration of intervertebral  disc of lumbar region with discogenic back pain 05/25/2023   Proteinuria 01/12/2023   S/P inguinal hernia repair 10/01/2022   Vitamin D  deficiency 03/09/2022   OA (osteoarthritis) of knee 11/05/2021   Nonrheumatic aortic valve stenosis 08/28/2021   BPH (benign prostatic hyperplasia) 02/26/2021   Systolic murmur 02/26/2021   Carpal tunnel syndrome 01/12/2020   Depression 03/10/2016   Cervical disc disorder with radiculopathy of cervical region 10/31/2014   IBS (irritable bowel syndrome) 10/19/2013   Hyperlipidemia 09/20/2012   Essential hypertension 09/20/2012   GERD (gastroesophageal reflux disease) 12/30/2011   Eosinophilic gastritis 07/07/2011    PCP: Tobie Suzzane POUR, MD   REFERRING PROVIDER: Bluford Jacqulyn MATSU, DO  REFERRING DIAG: M54.50 (ICD-10-CM) - Bilateral low back pain without sciatica, unspecified chronicity  Rationale for Evaluation and Treatment: Rehabilitation  THERAPY DIAG:  Weakness of both lower extremities  Impaired functional mobility and activity tolerance  Other low back pain  ONSET DATE: 2-3 months  SUBJECTIVE:                                                                                                                                                                                           SUBJECTIVE STATEMENT: 04/19/24:  Reports he can tell a difference, feels he is getting stronger.  Balance continues to be difficult  Eval:  Pt states he had a back operation back in 2010. Pt states back pain does not go into the legs and stays in low back. Pt states the back bothers him the most after mowing the yard and bending over.   PERTINENT HISTORY:  Low back surgery about 15 years ago HTN  PAIN:  Are you having pain? Yes: NPRS scale: 0/10 Pain location: middle of low back Pain description: constant, ache Aggravating factors: bending, first getting up, riding lawn mower Relieving factors: exercise, pain relievers  PRECAUTIONS: None  RED  FLAGS: None   WEIGHT BEARING RESTRICTIONS: No  FALLS:  Has patient fallen in last 6 months?  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  DIAGNOSTIC FINDINGS:  CLINICAL DATA:  Acute back pain.   EXAM: LUMBAR SPINE - 2-3 VIEW   COMPARISON:  01/21/2018.   FINDINGS: There is no evidence of acute lumbar spine fracture. There is mild anterolisthesis at C4-C5. Intervertebral disc space narrowing and degenerative endplate changes are noted at multiple levels and most pronounced at L4-L5. Mild facet arthropathy is noted.   IMPRESSION: 1. No acute fracture. 2. Multilevel degenerative disc disease and facet arthropathy. 3. Mild anterolisthesis at L4-L5, increased from 2019.  PATIENT SURVEYS:  Modified Oswestry: 19 / 50 = 38.0 %  COGNITION: Overall cognitive status: Within functional limits for tasks assessed     SENSATION: WFL   POSTURE: rounded shoulders, forward head, increased thoracic kyphosis, and flexed trunk   PALPATION: Pt demonstrates point tenderness to L2-L4 spinal segments, no tenderness noted with UPAs or palpation of sciatic nerve region bilaterally.  LUMBAR ROM:   AROM eval  Flexion 70, pain  Extension 20, worse pain  Right lateral flexion 25, pain  Left lateral flexion 12, worse pain  Right rotation 75% available  Left rotation 75% available   (Blank rows = not tested)  LOWER EXTREMITY ROM:     Active  Right eval Left eval  Hip flexion    Hip extension    Hip abduction    Hip adduction    Hip internal  rotation    Hip external rotation    Knee flexion    Knee extension    Ankle dorsiflexion    Ankle plantarflexion    Ankle inversion    Ankle eversion     (Blank rows = not tested)  LOWER EXTREMITY MMT:    MMT Right eval Left eval  Hip flexion 4 3+, pain  Hip extension 4 3+  Hip abduction 4 4  Hip adduction    Hip internal rotation    Hip external rotation    Knee flexion    Knee extension    Ankle dorsiflexion    Ankle plantarflexion    Ankle inversion    Ankle eversion     (Blank rows = not tested)  LUMBAR SPECIAL TESTS:  Straight leg raise test: Negative and Slump test: Positive, left side  FUNCTIONAL TESTS:  5 times sit to stand: 15.26, increased pain on left 2 minute walk test: 485, no increased back pain  GAIT: Distance walked: 485 Assistive device utilized: None Level of assistance: Complete Independence Comments: decreased stride length on LLE, slight antalgic gait, more pronounced as test progresses  TREATMENT DATE:  04/19/24: Standing at // bars with minimal to no UE assist Squat front of chair (chair tapping) SLS Lt 25, Rt 25 SLS on foam  Rt 22, Lt 11 Vector stance 5x 5 no UE support required  Lunges on floor with cueing for mechanics 15x Lateral step over 12in paired with STS to 18in step to simulate getting in and out of low car Postural strengthening with GTB shoulder extension 2X10 rows 2X10 Marching with shoulder extension 2x 10 Leg press 5 Pl 2x 10 Tandem stance  on foam 2x 30  04/18/24: Standing:  at // but without UE assist (unless stated otherwise) Alternating march 20X, high holds SLS on foam pad 30 each with intermittent HHA Vectors with 1 UE assist onto 4 box 2X10 Lunges onto 4 step 2X10 Sidestep on 20 ft line with GTB around thigh 3RT STS eccentric control down with use of tidal tank 10X Leg press 4Pl 10x 2 sets Postural strengthening with RTB shoulder extension 2X10 rows 2X10 Pallof press 2X10 each  direction  04/12/24: - Postural strengthening with RTB - shoulder extension 10 - rows 10 - Marching with intermittent HHA - SLS Rt 22, Lt 33 - Sidestep with RTB around thigh 3RT - STS eccentric control down, power up 10x - Squat front of chair 10x - Tandem stance 2x 30 on foam 2x 30 intermittent HHA - Lunges on 6in step no HHA - Leg press 4Pl 10x 2 sets - 6in then 12in hurdles paired with STS from 18in step (like getting in and out of car)  04/07/24: Reviewed goals Educated importance of HEP compliance for maximal benefits Pt able to recall and demonstrate appropriate mechanics  Supine: -Bridge 10x 5 (cueing to widen stance) - LTR 10x 10 - Supine posterior pelvic tilt (exhale with excertion) Sidelying:  -Clam with RTB Prone hip extension 10x  Quadruped: cat/ cow (limited range) Seated:  - Postural awareness  - Lumbar support  - Wback  - Cervical retraction 10x 5 verbal and tactile cueing  - Rows with RTB 10x  - STS no HHA eccentric control   04/04/2024  Evaluation: -ROM measured, Strength assessed, HEP prescribed, pt educated on prognosis, findings, and importance of HEP compliance if given.                                                                                                                                    PATIENT EDUCATION:  Education details: Pt was educated on findings of PT evaluation, prognosis, frequency of therapy visits and rationale, attendance policy, and HEP if given.   Person educated: Patient Education method: Explanation, Verbal cues, and Handouts Education comprehension: verbalized understanding, verbal cues required, and needs further education  HOME EXERCISE PROGRAM: Access Code: [email or web address] URL: [email or web address] Date: 04/05/2024 Prepared by: Lang Ada  Exercises - Supine Bridge  - 1 x daily - 7 x weekly - 3 sets - 10 reps - 3 hold - Supine Lower Trunk Rotation  - 1 x daily - 7 x weekly - 3 sets - 10  reps - Supine Posterior Pelvic Tilt  - 1 x daily - 7 x weekly - 3 sets - 10 reps - 3 hold  - Correct Seated Posture  - 5 x daily - 7 x weekly - 1 sets - 1 reps - Seated Scapular Retraction with External Rotation  - 2 x daily - 7 x weekly - 2 sets - 10 reps - 5 hold - Seated Chin Tuck  with Neck Elongation  - 2 x daily - 7 x weekly - 2 sets - 10 reps - 5 hold - Sit to Stand  - 2 x daily - 7 x weekly - 1 sets - 10 reps  04/19/24: GTB- postural strengthening.  -shoulder extension  - rows  ASSESSMENT:  CLINICAL IMPRESSION: 04/19/24:  Continued focus with core and proximal strengthening and stability exercises to assist with balance.  Added squats and progressed to lunges on floor for functional strengthening with min cueing required for form.  Increased to green theraband resistance with postural strengthening exercises and added marching for core stability with exercise.  Pt able to complete good form with theraband exercises, added to HEP with GTB and printout given.  Noted increased difficulty with Lt LE compared to Rt getting over hurdles.  No reports of pain through session.   OBJECTIVE IMPAIRMENTS: Abnormal gait, decreased activity tolerance, decreased balance, decreased endurance, decreased knowledge of use of DME, decreased mobility, difficulty walking, decreased ROM, decreased strength, hypomobility, impaired flexibility, postural dysfunction, and pain.   ACTIVITY LIMITATIONS: carrying, lifting, bending, standing, squatting, transfers, and bed mobility  PARTICIPATION LIMITATIONS: meal prep, cleaning, laundry, driving, shopping, community activity, occupation, and yard work  PERSONAL FACTORS: Age, Past/current experiences, Time since onset of injury/illness/exacerbation, and 1 comorbidity: Hx of low back surgery are also affecting patient's functional outcome.   REHAB POTENTIAL: Fair chronic in nature  CLINICAL DECISION MAKING: Stable/uncomplicated  EVALUATION COMPLEXITY:  Low   GOALS: Goals reviewed with patient? No  SHORT TERM GOALS: Target date: 04/26/24  Pt will be independent with HEP in order to demonstrate participation in Physical Therapy POC.  Baseline: Goal status: INITIAL  2.  Pt will report 3/10 pain with mobility in order to demonstrate improved pain with ADLs.  Baseline: 5/10 Goal status: INITIAL  LONG TERM GOALS: Target date: 05/17/24  Pt will improve 5TSTS by at least 2.5 seconds in order to demonstrate improved functional strength to return to desired activities.  Baseline: see objective.  Goal status: INITIAL  2.  Pt will improve 2 MWT by 40 feet in order to demonstrate improved functional ambulatory capacity in community setting.  Baseline: see objective.  Goal status: INITIAL  3.  Pt will improve Modified Oswestry score by at least 6 points in order to demonstrate improved pain with functional goals and outcomes. Baseline: see objective.  Goal status: INITIAL  4.  Pt will report 2/10 pain with mobility in order to demonstrate reduced pain with ADLs lasting greater than 30 minutes.  Baseline: see objective.  Goal status: INITIAL   PLAN:  PT FREQUENCY: 1-2x/week  PT DURATION: 6 weeks  PLANNED INTERVENTIONS: 97110-Therapeutic exercises, 97530- Therapeutic activity, [id number]- Neuromuscular re-education, 97535- Self Care, [id number]- Manual therapy, [phone]- Gait training, [phone]- Electrical stimulation (unattended), [phone]- Electrical stimulation (manual), [phone] (1-2 muscles), [phone] (3+ muscles)- Dry Needling, Patient/Family education, Balance training, Stair training, Spinal manipulation, Spinal mobilization, DME instructions, Cryotherapy, and Moist heat.  PLAN FOR NEXT SESSION:  progress core and LE strengthening as well as HEP as appropriate.    Augustin Mclean, LPTA/CLT; CBIS [phone]  10:02 AM, 04/19/24

## 2024-04-26 ENCOUNTER — Encounter (HOSPITAL_COMMUNITY): Payer: Self-pay

## 2024-04-26 ENCOUNTER — Ambulatory Visit (HOSPITAL_COMMUNITY)

## 2024-04-26 DIAGNOSIS — M5459 Other low back pain: Secondary | ICD-10-CM | POA: Diagnosis not present

## 2024-04-26 DIAGNOSIS — Z7409 Other reduced mobility: Secondary | ICD-10-CM | POA: Diagnosis not present

## 2024-04-26 DIAGNOSIS — R29898 Other symptoms and signs involving the musculoskeletal system: Secondary | ICD-10-CM | POA: Diagnosis not present

## 2024-04-26 NOTE — Therapy (Signed)
 OUTPATIENT PHYSICAL THERAPY THORACOLUMBAR TREATMENT   Patient Name: Adam [name] MRN: [id number] DOB:1950/07/05, 73 y.o., male Today's Date: 04/26/2024  END OF SESSION:  PT End of Session - 04/26/24 0729     Visit Number 6    Number of Visits 12    Date for Recertification  05/17/24    Authorization Type [employer]    Authorization Time Period cohere approved 12 visits from 04/05/24-07/04/2024    Authorization - Visit Number 5    Authorization - Number of Visits 12    Progress Note Due on Visit 10    PT Start Time 0730    PT Stop Time 0816    PT Time Calculation (min) 46 min    Activity Tolerance Patient tolerated treatment well;No increased pain    Behavior During Therapy [facility] for tasks assessed/performed          Past Medical History:  Diagnosis Date   Allergy    BMI 31.0-31.9,adult JUNE 2011 180 LBS    Chronic back pain    Depression    Eosinophilic granuloma (HCC) JAN 2011 GASTRIC, followed by Dr. Cathern   EGD JAN 2011, JUN 2011, NOV 2011   Gastric ulcer 08/31/2012   GERD (gastroesophageal reflux disease)    HTN (hypertension)    Hyperlipemia    IBS (irritable bowel syndrome)    Prostate hypertrophy    Past Surgical History:  Procedure Laterality Date   BACK SURGERY     COLONOSCOPY  JAN 2011 COMPLETE   hypoTN and bradycardia w/ Demerol  ,Phenergan and Versed , [place] TICS, SML IH   COLONOSCOPY  2003   NUR-normal   COLONOSCOPY  06/21/09   Fields-sigmoid diverticulosis, sm int hemorrhoids   COLONOSCOPY N/A 10/24/2013   Normal mucosa in the terminal ileum Moderate diverticulosis noted in the sigmoid colonModerate sized internal hemorrhoids. negative random colon bx.. next TCS 10/2023   COLONOSCOPY WITH PROPOFOL  N/A 08/23/2023   Procedure: COLONOSCOPY WITH PROPOFOL ;  Surgeon: Cindie Carlin POUR, DO;  [facility];  Service: Endoscopy;  Laterality: N/A;  9:30 am, asa 3   Cyst Removed     from Left leg and knee   ESOPHAGOGASTRODUODENOSCOPY   05/20/2012   SLF: MILD ESOPHAGITIS.NO BARRETT'S/Multiple ulcers ranging between 3-5 mm/MILD Duodenal inflammation was found in the duodenal bulb   ESOPHAGOGASTRODUODENOSCOPY N/A 09/05/2012   SLF: MILD Non-erosive gastritis (inflammation) in the gastric antrum. Biopsies benign, no H. pylori.   ESOPHAGOGASTRODUODENOSCOPY N/A 10/24/2013   DOQ:ENDDPAOZ DISTAL ESOPHAGEAL WEBSmall hiatal herniaMILD Non-erosive gastritis. SB bx benign. gastric bx, minimal inflammation.    GANGLION CYST EXCISION     from L wrist and pins placed for Fx involving L thumb   HAND SURGERY  at the age of 3   Brother accidentally cut it   MALONEY DILATION N/A 10/24/2013   Procedure: AGAPITO DILATION;  Surgeon: Margo LITTIE Haddock, MD;  Location: [facility];  Service: Endoscopy;  Laterality: N/A;   MASS EXCISION  04/29/2012   Procedure: EXCISION MASS;  Surgeon: Thresa JAYSON Pulling, MD;  Location: [facility];  Service: General;  Laterality: Right;  Excision of Vascular Mass Right Arm   SAVORY DILATION N/A 10/24/2013   Procedure: SAVORY DILATION;  Surgeon: Margo LITTIE Haddock, MD;  Location: [facility];  Service: Endoscopy;  Laterality: N/A;   SHOULDER SURGERY Left 2017   TUMOR REMOVAL  1994   [facility] brain [facility]   UPPER GASTROINTESTINAL ENDOSCOPY  JAN 2011   GASTRIC ULCER-EOS GRANULOMA  UPPER GASTROINTESTINAL ENDOSCOPY  JUN 2011   GASTRIC ULCER- EOS, NO H. PYLORI   UPPER GASTROINTESTINAL ENDOSCOPY  NOV 2011   GASTRIC NODULE, no ulcer- EOS   XI ROBOTIC ASSISTED INGUINAL HERNIA REPAIR WITH MESH Right 09/25/2022   Procedure: XI ROBOTIC ASSISTED INGUINAL HERNIA REPAIR WITH MESH;  Surgeon: Evonnie Dorothyann LABOR, DO;  Location: [facility];  Service: General;  Laterality: Right;   Patient Active Problem List   Diagnosis Date Noted   Bilateral low back pain without sciatica 03/12/2024   Elevated PSA 11/23/2023   Chronic sinusitis 11/23/2023   Prediabetes 11/23/2023   Hemorrhoids 05/25/2023   Degeneration of intervertebral  disc of lumbar region with discogenic back pain 05/25/2023   Proteinuria 01/12/2023   S/P inguinal hernia repair 10/01/2022   Vitamin D  deficiency 03/09/2022   OA (osteoarthritis) of knee 11/05/2021   Nonrheumatic aortic valve stenosis 08/28/2021   BPH (benign prostatic hyperplasia) 02/26/2021   Systolic murmur 02/26/2021   Carpal tunnel syndrome 01/12/2020   Depression 03/10/2016   Cervical disc disorder with radiculopathy of cervical region 10/31/2014   IBS (irritable bowel syndrome) 10/19/2013   Hyperlipidemia 09/20/2012   Essential hypertension 09/20/2012   GERD (gastroesophageal reflux disease) 12/30/2011   Eosinophilic gastritis 07/07/2011    PCP: Tobie Suzzane POUR, MD   REFERRING PROVIDER: Bluford Jacqulyn MATSU, DO  REFERRING DIAG: M54.50 (ICD-10-CM) - Bilateral low back pain without sciatica, unspecified chronicity  Rationale for Evaluation and Treatment: Rehabilitation  THERAPY DIAG:  Weakness of both lower extremities  Impaired functional mobility and activity tolerance  Other low back pain  ONSET DATE: 2-3 months  SUBJECTIVE:                                                                                                                                                                                           SUBJECTIVE STATEMENT: 04/26/24:  Stated he is feeling great today.  Reports he had some pain following the exercises yesterday.  Balance continues to be difficult though feels some improvements.  Eval:  Pt states he had a back operation back in 2010. Pt states back pain does not go into the legs and stays in low back. Pt states the back bothers him the most after mowing the yard and bending over.   PERTINENT HISTORY:  Low back surgery about 15 years ago HTN  PAIN:  Are you having pain? Yes: NPRS scale: 0/10 Pain location: middle of low back Pain description: constant, ache Aggravating factors: bending, first getting up, riding lawn mower Relieving factors:  exercise, pain relievers  PRECAUTIONS: None  RED FLAGS: None   WEIGHT BEARING RESTRICTIONS: No  FALLS:  Has patient fallen in last 6 months? 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  DIAGNOSTIC FINDINGS:  CLINICAL DATA:  Acute back pain.   EXAM: LUMBAR SPINE - 2-3 VIEW   COMPARISON:  01/21/2018.   FINDINGS: There is no evidence of acute lumbar spine fracture. There is mild anterolisthesis at C4-C5. Intervertebral disc space narrowing and degenerative endplate changes are noted at multiple levels and most pronounced at L4-L5. Mild facet arthropathy is noted.   IMPRESSION: 1. No acute fracture. 2. Multilevel degenerative disc disease and facet arthropathy. 3. Mild anterolisthesis at L4-L5, increased from 2019.  PATIENT SURVEYS:  Modified Oswestry: 19 / 50 = 38.0 %  COGNITION: Overall cognitive status: Within functional limits for tasks assessed     SENSATION: WFL   POSTURE: rounded shoulders, forward head, increased thoracic kyphosis, and flexed trunk   PALPATION: Pt demonstrates point tenderness to L2-L4 spinal segments, no tenderness noted with UPAs or palpation of sciatic nerve region bilaterally.  LUMBAR ROM:   AROM eval  Flexion 70, pain  Extension 20, worse pain  Right lateral flexion 25, pain  Left lateral flexion 12, worse pain  Right rotation 75% available  Left rotation 75% available   (Blank rows = not tested)  LOWER EXTREMITY ROM:     Active  Right eval Left eval  Hip flexion    Hip extension     Hip abduction    Hip adduction    Hip internal rotation    Hip external rotation    Knee flexion    Knee extension    Ankle dorsiflexion    Ankle plantarflexion    Ankle inversion    Ankle eversion     (Blank rows = not tested)  LOWER EXTREMITY MMT:    MMT Right eval Left eval  Hip flexion 4 3+, pain  Hip extension 4 3+  Hip abduction 4 4  Hip adduction    Hip internal rotation    Hip external rotation    Knee flexion    Knee extension    Ankle dorsiflexion    Ankle plantarflexion    Ankle inversion    Ankle eversion     (Blank rows = not tested)  LUMBAR SPECIAL TESTS:  Straight leg raise test: Negative and Slump test: Positive, left side  FUNCTIONAL TESTS:  5 times sit to stand: 15.26, increased pain on left 2 minute walk test: 485, no increased back pain  GAIT: Distance walked: 485 Assistive device utilized: None Level of assistance: Complete Independence Comments: decreased stride length on LLE, slight antalgic gait, more pronounced as test progresses  TREATMENT DATE:  04/26/24:  Squat front of chair to heel raise with no UE support 10x   Marching with shoulder extension GTB alternating 10x 3  Paloff with GTB NBOS 10x each side  Lunge onto BOSU 10x no UE support  Tandem stance on foam 2x 30; 2nd set with 2# bar punching forward and return 2x 10  SLS Lt 46, Rt 46 Lateral step over 12in paired with STS to 18in step to simulate getting in and out  of low car Lateral lunges 10x Leg press 6Pl 2x 10  04/19/24: Standing at // bars with minimal to no UE assist Squat front of chair (chair tapping) SLS Lt 25, Rt 25 SLS on foam  Rt 22, Lt 11 Vector stance 5x 5 no UE support required  Lunges on floor with cueing for mechanics 15x Lateral step over 12in paired with STS to 18in step to simulate getting in and out of low car Postural strengthening with GTB shoulder extension 2X10 rows 2X10 Marching with shoulder extension 2x 10 Leg press 5 Pl 2x  10 Tandem stance on foam 2x 30  04/18/24: Standing:  at // but without UE assist (unless stated otherwise) Alternating march 20X, high holds SLS on foam pad 30 each with intermittent HHA Vectors with 1 UE assist onto 4 box 2X10 Lunges onto 4 step 2X10 Sidestep on 20 ft line with GTB around thigh 3RT STS eccentric control down with use of tidal tank 10X Leg press 4Pl 10x 2 sets Postural strengthening with RTB shoulder extension 2X10 rows 2X10 Pallof press 2X10 each direction  04/12/24: - Postural strengthening with RTB - shoulder extension 10 - rows 10 - Marching with intermittent HHA - SLS Rt 22, Lt 33 - Sidestep with RTB around thigh 3RT - STS eccentric control down, power up 10x - Squat front of chair 10x - Tandem stance 2x 30 on foam 2x 30 intermittent HHA - Lunges on 6in step no HHA - Leg press 4Pl 10x 2 sets - 6in then 12in hurdles paired with STS from 18in step (like getting in and out of car)  04/07/24: Reviewed goals Educated importance of HEP compliance for maximal benefits Pt able to recall and demonstrate appropriate mechanics  Supine: -Bridge 10x 5 (cueing to widen stance) - LTR 10x 10 - Supine posterior pelvic tilt (exhale with excertion) Sidelying:  -Clam with RTB Prone hip extension 10x  Quadruped: cat/ cow (limited range) Seated:  - Postural awareness  - Lumbar support  - Wback  - Cervical retraction 10x 5 verbal and tactile cueing  - Rows with RTB 10x  - STS no HHA eccentric control   04/04/2024  Evaluation: -ROM measured, Strength assessed, HEP prescribed, pt educated on prognosis, findings, and importance of HEP compliance if given.                                                                                                                                    PATIENT EDUCATION:  Education details: Pt was educated on findings of PT evaluation, prognosis, frequency of therapy visits and rationale, attendance policy, and HEP  if given.   Person educated: Patient Education method: Explanation, Verbal cues, and Handouts Education comprehension: verbalized understanding, verbal cues required, and needs further education  HOME EXERCISE PROGRAM: Access Code: [email or web address] URL: [email or web address] Date: 04/05/2024 Prepared by: Lang Ada  Exercises - Supine Bridge  - 1 x daily - 7 x weekly -  3 sets - 10 reps - 3 hold - Supine Lower Trunk Rotation  - 1 x daily - 7 x weekly - 3 sets - 10 reps - Supine Posterior Pelvic Tilt  - 1 x daily - 7 x weekly - 3 sets - 10 reps - 3 hold  - Correct Seated Posture  - 5 x daily - 7 x weekly - 1 sets - 1 reps - Seated Scapular Retraction with External Rotation  - 2 x daily - 7 x weekly - 2 sets - 10 reps - 5 hold - Seated Chin Tuck with Neck Elongation  - 2 x daily - 7 x weekly - 2 sets - 10 reps - 5 hold - Sit to Stand  - 2 x daily - 7 x weekly - 1 sets - 10 reps  04/19/24: GTB- postural strengthening.  -shoulder extension  - rows  ASSESSMENT:  CLINICAL IMPRESSION: 04/26/24:  Pt is progressing well with good mechanics and form with less cueing required.  Improved SLS noted BLE.  Progressed this session with additional functional strengthening exercises and dynamic surface for stability.  Added lateral lunges with most cueing required for form and mechanics that improved with reps.  Presents with increased ease with the hurdle scenario to simulate getting into low surface vehicles.  Pt able to complete whole session with no reports of increased pain.  Pt would like to learn easy ways to get up from the floor, plan to add next session.    OBJECTIVE IMPAIRMENTS: Abnormal gait, decreased activity tolerance, decreased balance, decreased endurance, decreased knowledge of use of DME, decreased mobility, difficulty walking, decreased ROM, decreased strength, hypomobility, impaired flexibility, postural dysfunction, and pain.   ACTIVITY LIMITATIONS: carrying, lifting,  bending, standing, squatting, transfers, and bed mobility  PARTICIPATION LIMITATIONS: meal prep, cleaning, laundry, driving, shopping, community activity, occupation, and yard work  PERSONAL FACTORS: Age, Past/current experiences, Time since onset of injury/illness/exacerbation, and 1 comorbidity: Hx of low back surgery are also affecting patient's functional outcome.   REHAB POTENTIAL: Fair chronic in nature  CLINICAL DECISION MAKING: Stable/uncomplicated  EVALUATION COMPLEXITY: Low   GOALS: Goals reviewed with patient? No  SHORT TERM GOALS: Target date: 04/26/24  Pt will be independent with HEP in order to demonstrate participation in Physical Therapy POC.  Baseline: Goal status: INITIAL  2.  Pt will report 3/10 pain with mobility in order to demonstrate improved pain with ADLs.  Baseline: 5/10 Goal status: INITIAL  LONG TERM GOALS: Target date: 05/17/24  Pt will improve 5TSTS by at least 2.5 seconds in order to demonstrate improved functional strength to return to desired activities.  Baseline: see objective.  Goal status: INITIAL  2.  Pt will improve 2 MWT by 40 feet in order to demonstrate improved functional ambulatory capacity in community setting.  Baseline: see objective.  Goal status: INITIAL  3.  Pt will improve Modified Oswestry score by at least 6 points in order to demonstrate improved pain with functional goals and outcomes. Baseline: see objective.  Goal status: INITIAL  4.  Pt will report 2/10 pain with mobility in order to demonstrate reduced pain with ADLs lasting greater than 30 minutes.  Baseline: see objective.  Goal status: INITIAL   PLAN:  PT FREQUENCY: 1-2x/week  PT DURATION: 6 weeks  PLANNED INTERVENTIONS: 97110-Therapeutic exercises, 97530- Therapeutic activity, [id number]- Neuromuscular re-education, 97535- Self Care, [id number]- Manual therapy, [id number]- Gait training, [phone]- Electrical stimulation (unattended), [id number]- Electrical stimulation  (manual), 20560 (1-2 muscles), [id number] (3+ muscles)- Dry Needling, Patient/Family  education, Balance training, Stair training, Spinal manipulation, Spinal mobilization, DME instructions, Cryotherapy, and Moist heat.  PLAN FOR NEXT SESSION:  progress core and LE strengthening as well as HEP as appropriate.  Work on floor to standing next session.    Augustin Mclean, LPTA/CLT; CBIS [phone]  8:24 AM, 04/26/24

## 2024-04-28 ENCOUNTER — Ambulatory Visit (HOSPITAL_COMMUNITY)

## 2024-04-28 ENCOUNTER — Encounter (HOSPITAL_COMMUNITY): Payer: Self-pay

## 2024-04-28 DIAGNOSIS — R29898 Other symptoms and signs involving the musculoskeletal system: Secondary | ICD-10-CM

## 2024-04-28 DIAGNOSIS — Z7409 Other reduced mobility: Secondary | ICD-10-CM | POA: Diagnosis not present

## 2024-04-28 DIAGNOSIS — M5459 Other low back pain: Secondary | ICD-10-CM | POA: Diagnosis not present

## 2024-04-28 NOTE — Therapy (Signed)
 OUTPATIENT PHYSICAL THERAPY THORACOLUMBAR TREATMENT   Patient Name: Adam [name] MRN: [id number] DOB:1951/01/22, 73 y.o., male Today's Date: 04/28/2024  END OF SESSION:  PT End of Session - 04/28/24 0825     Visit Number 7    Number of Visits 12    Date for Recertification  05/17/24    Authorization Type [employer]    Authorization Time Period cohere approved 12 visits from 04/05/24-07/04/2024    Authorization - Visit Number 6    Authorization - Number of Visits 12    Progress Note Due on Visit 10    PT Start Time 0825   late arrival   PT Stop Time 0900    PT Time Calculation (min) 35 min    Activity Tolerance Patient tolerated treatment well;No increased pain    Behavior During Therapy [facility] for tasks assessed/performed          Past Medical History:  Diagnosis Date   Allergy    BMI 31.0-31.9,adult JUNE 2011 180 LBS    Chronic back pain    Depression    Eosinophilic granuloma (HCC) JAN 2011 GASTRIC, followed by Dr. Cathern   EGD JAN 2011, JUN 2011, NOV 2011   Gastric ulcer 08/31/2012   GERD (gastroesophageal reflux disease)    HTN (hypertension)    Hyperlipemia    IBS (irritable bowel syndrome)    Prostate hypertrophy    Past Surgical History:  Procedure Laterality Date   BACK SURGERY     COLONOSCOPY  JAN 2011 COMPLETE   hypoTN and bradycardia w/ Demerol  ,Phenergan and Versed , Keddie TICS, SML IH   COLONOSCOPY  2003   NUR-normal   COLONOSCOPY  06/21/09   Fields-sigmoid diverticulosis, sm int hemorrhoids   COLONOSCOPY N/A 10/24/2013   Normal mucosa in the terminal ileum Moderate diverticulosis noted in the sigmoid colonModerate sized internal hemorrhoids. negative random colon bx.. next TCS 10/2023   COLONOSCOPY WITH PROPOFOL  N/A 08/23/2023   Procedure: COLONOSCOPY WITH PROPOFOL ;  Surgeon: Cindie Carlin POUR, DO;  [facility];  Service: Endoscopy;  Laterality: N/A;  9:30 am, asa 3   Cyst Removed     from Left leg and knee    ESOPHAGOGASTRODUODENOSCOPY  05/20/2012   SLF: MILD ESOPHAGITIS.NO BARRETT'S/Multiple ulcers ranging between 3-5 mm/MILD Duodenal inflammation was found in the duodenal bulb   ESOPHAGOGASTRODUODENOSCOPY N/A 09/05/2012   SLF: MILD Non-erosive gastritis (inflammation) in the gastric antrum. Biopsies benign, no H. pylori.   ESOPHAGOGASTRODUODENOSCOPY N/A 10/24/2013   DOQ:ENDDPAOZ DISTAL ESOPHAGEAL WEBSmall hiatal herniaMILD Non-erosive gastritis. SB bx benign. gastric bx, minimal inflammation.    GANGLION CYST EXCISION     from L wrist and pins placed for Fx involving L thumb   HAND SURGERY  at the age of 3   Brother accidentally cut it   MALONEY DILATION N/A 10/24/2013   Procedure: AGAPITO DILATION;  Surgeon: Margo LITTIE Haddock, MD;  Location[facility];  Service: Endoscopy;  Laterality: N/A;   MASS EXCISION  04/29/2012   Procedure: EXCISION MASS;  Surgeon: Thresa JAYSON Pulling, MD;  Location: [facility];  Service: General;  Laterality: Right;  Excision of Vascular Mass Right Arm   SAVORY DILATION N/A 10/24/2013   Procedure: SAVORY DILATION;  Surgeon: Margo LITTIE Haddock, MD;  [facility];  Service: Endoscopy;  Laterality: N/A;   SHOULDER SURGERY Left 2017   TUMOR REMOVAL  1994   Benign from brain at [facility]   UPPER GASTROINTESTINAL ENDOSCOPY  JAN 2011  GASTRIC ULCER-EOS GRANULOMA   UPPER GASTROINTESTINAL ENDOSCOPY  JUN 2011   GASTRIC ULCER- EOS, NO H. PYLORI   UPPER GASTROINTESTINAL ENDOSCOPY  NOV 2011   GASTRIC NODULE, no ulcer- EOS   XI ROBOTIC ASSISTED INGUINAL HERNIA REPAIR WITH MESH Right 09/25/2022   Procedure: XI ROBOTIC ASSISTED INGUINAL HERNIA REPAIR WITH MESH;  Surgeon: Evonnie Dorothyann LABOR, DO;  Location: [facility];  Service: General;  Laterality: Right;   Patient Active Problem List   Diagnosis Date Noted   Bilateral low back pain without sciatica 03/12/2024   Elevated PSA 11/23/2023   Chronic sinusitis 11/23/2023   Prediabetes 11/23/2023   Hemorrhoids 05/25/2023    Degeneration of intervertebral disc of lumbar region with discogenic back pain 05/25/2023   Proteinuria 01/12/2023   S/P inguinal hernia repair 10/01/2022   Vitamin D  deficiency 03/09/2022   OA (osteoarthritis) of knee 11/05/2021   Nonrheumatic aortic valve stenosis 08/28/2021   BPH (benign prostatic hyperplasia) 02/26/2021   Systolic murmur 02/26/2021   Carpal tunnel syndrome 01/12/2020   Depression 03/10/2016   Cervical disc disorder with radiculopathy of cervical region 10/31/2014   IBS (irritable bowel syndrome) 10/19/2013   Hyperlipidemia 09/20/2012   Essential hypertension 09/20/2012   GERD (gastroesophageal reflux disease) 12/30/2011   Eosinophilic gastritis 07/07/2011    PCP: Tobie Suzzane POUR, MD   REFERRING PROVIDER: Bluford Jacqulyn MATSU, DO  REFERRING DIAG: M54.50 (ICD-10-CM) - Bilateral low back pain without sciatica, unspecified chronicity  Rationale for Evaluation and Treatment: Rehabilitation  THERAPY DIAG:  Weakness of both lower extremities  Impaired functional mobility and activity tolerance  Other low back pain  ONSET DATE: 2-3 months  SUBJECTIVE:                                                                                                                                                                                           SUBJECTIVE STATEMENT: Stated he feels good today.  Reports no pain or issues. Reports his truck didn't start this morning, that's why he was late.   Eval:  Pt states he had a back operation back in 2010. Pt states back pain does not go into the legs and stays in low back. Pt states the back bothers him the most after mowing the yard and bending over.   PERTINENT HISTORY:  Low back surgery about 15 years ago HTN  PAIN:  Are you having pain? Yes: NPRS scale: 0/10 Pain location: middle of low back Pain description: constant, ache Aggravating factors: bending, first getting up, riding lawn mower Relieving factors: exercise, pain  relievers  PRECAUTIONS: None  RED FLAGS: None   WEIGHT BEARING RESTRICTIONS: No  FALLS:  Has patient fallen in last 6 months? 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  DIAGNOSTIC FINDINGS:  CLINICAL DATA:  Acute back pain.   EXAM: LUMBAR SPINE - 2-3 VIEW   COMPARISON:  01/21/2018.   FINDINGS: There is no evidence of acute lumbar spine fracture. There is mild anterolisthesis at C4-C5. Intervertebral disc space narrowing and degenerative endplate changes are noted at multiple levels and most pronounced at L4-L5. Mild facet arthropathy is noted.   IMPRESSION: 1. No acute fracture. 2. Multilevel degenerative disc disease and facet arthropathy. 3. Mild anterolisthesis at L4-L5, increased from 2019.  PATIENT SURVEYS:  Modified Oswestry: 19 / 50 = 38.0 %  COGNITION: Overall cognitive status: Within functional limits for tasks assessed     SENSATION: WFL   POSTURE: rounded shoulders, forward head, increased thoracic kyphosis, and flexed trunk   PALPATION: Pt demonstrates point tenderness to L2-L4 spinal segments, no tenderness noted with UPAs or palpation of sciatic nerve region bilaterally.  LUMBAR ROM:   AROM eval  Flexion 70, pain  Extension 20, worse pain  Right lateral flexion 25, pain  Left lateral flexion 12, worse pain  Right rotation 75% available  Left rotation 75% available   (Blank rows = not tested)  LOWER EXTREMITY ROM:     Active  Right eval Left eval  Hip flexion    Hip extension    Hip abduction     Hip adduction    Hip internal rotation    Hip external rotation    Knee flexion    Knee extension    Ankle dorsiflexion    Ankle plantarflexion    Ankle inversion    Ankle eversion     (Blank rows = not tested)  LOWER EXTREMITY MMT:    MMT Right eval Left eval  Hip flexion 4 3+, pain  Hip extension 4 3+  Hip abduction 4 4  Hip adduction    Hip internal rotation    Hip external rotation    Knee flexion    Knee extension    Ankle dorsiflexion    Ankle plantarflexion    Ankle inversion    Ankle eversion     (Blank rows = not tested)  LUMBAR SPECIAL TESTS:  Straight leg raise test: Negative and Slump test: Positive, left side  FUNCTIONAL TESTS:  5 times sit to stand: 15.26, increased pain on left 2 minute walk test: 485, no increased back pain  GAIT: Distance walked: 485 Assistive device utilized: None Level of assistance: Complete Independence Comments: decreased stride length on LLE, slight antalgic gait, more pronounced as test progresses  TREATMENT DATE:  04/28/24: NuStep, level 3, 4' STS with feet on foam, x10, no UE use  +march, 10x  +tidal tank, 10x Aeromat, side stepping in // bars, down and back 2x      + three obstacles, 2 six inch and one 12 inch, down and back 2x Aeromat, tandem amb in // bars, down and back 2x      + three obstacles, 2 six inch and one 12 inch, down and back 2x Tandem  stance on aeromat, passing 5 cones from R/L, eye gaze stays on cones, over and back 2x, twice with RLE, twice with LLE leading   04/26/24:  Squat front of chair to heel raise with no UE support 10x   Marching with shoulder extension GTB alternating 10x 3  Paloff with GTB NBOS 10x each side  Lunge onto BOSU 10x no UE support  Tandem stance on foam 2x 30; 2nd set with 2# bar punching forward and return 2x 10  SLS Lt 46, Rt 46 Lateral step over 12in paired with STS to 18in step to simulate getting in and out of low car Lateral lunges 10x Leg press 6Pl 2x  10  04/19/24: Standing at // bars with minimal to no UE assist Squat front of chair (chair tapping) SLS Lt 25, Rt 25 SLS on foam  Rt 22, Lt 11 Vector stance 5x 5 no UE support required  Lunges on floor with cueing for mechanics 15x Lateral step over 12in paired with STS to 18in step to simulate getting in and out of low car Postural strengthening with GTB shoulder extension 2X10 rows 2X10 Marching with shoulder extension 2x 10 Leg press 5 Pl 2x 10 Tandem stance on foam 2x 30   PATIENT EDUCATION:  Education details: Pt was educated on findings of PT evaluation, prognosis, frequency of therapy visits and rationale, attendance policy, and HEP if given.   Person educated: Patient Education method: Explanation, Verbal cues, and Handouts Education comprehension: verbalized understanding, verbal cues required, and needs further education  HOME EXERCISE PROGRAM: Access Code: [email or web address] URL: [email or web address] Date: 04/05/2024 Prepared by: Lang Ada  Exercises - Supine Bridge  - 1 x daily - 7 x weekly - 3 sets - 10 reps - 3 hold - Supine Lower Trunk Rotation  - 1 x daily - 7 x weekly - 3 sets - 10 reps - Supine Posterior Pelvic Tilt  - 1 x daily - 7 x weekly - 3 sets - 10 reps - 3 hold  - Correct Seated Posture  - 5 x daily - 7 x weekly - 1 sets - 1 reps - Seated Scapular Retraction with External Rotation  - 2 x daily - 7 x weekly - 2 sets - 10 reps - 5 hold - Seated Chin Tuck with Neck Elongation  - 2 x daily - 7 x weekly - 2 sets - 10 reps - 5 hold - Sit to Stand  - 2 x daily - 7 x weekly - 1 sets - 10 reps  04/19/24: GTB- postural strengthening.  -shoulder extension  - rows  ASSESSMENT:  CLINICAL IMPRESSION: Patient arrives to session with reports of no pain this date. Began on NuStep at inc resistance level. Followed with LE strengthening in combination with balance on soft surface. Pt demonstrates good balance overall with no issues without UE use.  Pt demonstrates good ankle strategy during obstacle clearance activity while on soft surfaces, at times utilizing shoulder abduction strategy.  Most difficulty this date when standing in tandem stance while using UE to pass objects from R<>L while instructed to keep eyes on object. Pt requires CGA, and has to catch himself with assist from parallel bars at times but improves with duration of task. Patient will benefit from continued skilled physical therapy in order to address current deficits in order to improve overall function and QOL.    OBJECTIVE IMPAIRMENTS: Abnormal gait, decreased activity tolerance, decreased balance, decreased endurance, decreased knowledge of use of DME, decreased mobility, difficulty  walking, decreased ROM, decreased strength, hypomobility, impaired flexibility, postural dysfunction, and pain.   ACTIVITY LIMITATIONS: carrying, lifting, bending, standing, squatting, transfers, and bed mobility  PARTICIPATION LIMITATIONS: meal prep, cleaning, laundry, driving, shopping, community activity, occupation, and yard work  PERSONAL FACTORS: Age, Past/current experiences, Time since onset of injury/illness/exacerbation, and 1 comorbidity: Hx of low back surgery are also affecting patient's functional outcome.   REHAB POTENTIAL: Fair chronic in nature  CLINICAL DECISION MAKING: Stable/uncomplicated  EVALUATION COMPLEXITY: Low   GOALS: Goals reviewed with patient? No  SHORT TERM GOALS: Target date: 04/26/24  Pt will be independent with HEP in order to demonstrate participation in Physical Therapy POC.  Baseline: Goal status: INITIAL  2.  Pt will report 3/10 pain with mobility in order to demonstrate improved pain with ADLs.  Baseline: 5/10 Goal status: INITIAL  LONG TERM GOALS: Target date: 05/17/24  Pt will improve 5TSTS by at least 2.5 seconds in order to demonstrate improved functional strength to return to desired activities.  Baseline: see objective.  Goal  status: INITIAL  2.  Pt will improve 2 MWT by 40 feet in order to demonstrate improved functional ambulatory capacity in community setting.  Baseline: see objective.  Goal status: INITIAL  3.  Pt will improve Modified Oswestry score by at least 6 points in order to demonstrate improved pain with functional goals and outcomes. Baseline: see objective.  Goal status: INITIAL  4.  Pt will report 2/10 pain with mobility in order to demonstrate reduced pain with ADLs lasting greater than 30 minutes.  Baseline: see objective.  Goal status: INITIAL   PLAN:  PT FREQUENCY: 1-2x/week  PT DURATION: 6 weeks  PLANNED INTERVENTIONS: [id number]-Therapeutic exercises, 97530- Therapeutic activity, [id number]- Neuromuscular re-education, 97535- Self Care, [id number]- Manual therapy, [phone]- Gait training, [phone]- Electrical stimulation (unattended), [phone]- Electrical stimulation (manual), [phone] (1-2 muscles), [phone] (3+ muscles)- Dry Needling, Patient/Family education, Balance training, Stair training, Spinal manipulation, Spinal mobilization, DME instructions, Cryotherapy, and Moist heat.  PLAN FOR NEXT SESSION:  progress core and LE strengthening as well as HEP as appropriate.  Work on floor to standing next session.      9:06 AM, 04/28/24 Rosaria Settler, PT, DPT [facility] [place]

## 2024-05-01 ENCOUNTER — Ambulatory Visit (HOSPITAL_COMMUNITY): Admitting: Physical Therapy

## 2024-05-01 DIAGNOSIS — Z7409 Other reduced mobility: Secondary | ICD-10-CM | POA: Diagnosis not present

## 2024-05-01 DIAGNOSIS — R29898 Other symptoms and signs involving the musculoskeletal system: Secondary | ICD-10-CM

## 2024-05-01 DIAGNOSIS — M5459 Other low back pain: Secondary | ICD-10-CM

## 2024-05-01 NOTE — Therapy (Signed)
 OUTPATIENT PHYSICAL THERAPY THORACOLUMBAR TREATMENT   Patient Name: Adam [name] MRN: [id number] DOB:11/08/1950, 73 y.o., male Today's Date: 05/01/2024  END OF SESSION:  PT End of Session - 05/01/24 0735     Visit Number 8    Number of Visits 12    Date for Recertification  05/17/24    Authorization Type [employer]    Authorization Time Period cohere approved 12 visits from 04/05/24-07/04/2024    Authorization - Number of Visits 12    Progress Note Due on Visit 10    PT Start Time 0733    PT Stop Time 0815    PT Time Calculation (min) 42 min    Activity Tolerance Patient tolerated treatment well;No increased pain    Behavior During Therapy [facility] for tasks assessed/performed          Past Medical History:  Diagnosis Date   Allergy    BMI 31.0-31.9,adult JUNE 2011 180 LBS    Chronic back pain    Depression    Eosinophilic granuloma (HCC) JAN 2011 GASTRIC, followed by Dr. Cathern   EGD JAN 2011, JUN 2011, NOV 2011   Gastric ulcer 08/31/2012   GERD (gastroesophageal reflux disease)    HTN (hypertension)    Hyperlipemia    IBS (irritable bowel syndrome)    Prostate hypertrophy    Past Surgical History:  Procedure Laterality Date   BACK SURGERY     COLONOSCOPY  JAN 2011 COMPLETE   hypoTN and bradycardia w/ Demerol  ,Phenergan and Versed , [place] TICS, SML IH   COLONOSCOPY  2003   NUR-normal   COLONOSCOPY  06/21/09   Fields-sigmoid diverticulosis, sm int hemorrhoids   COLONOSCOPY N/A 10/24/2013   Normal mucosa in the terminal ileum Moderate diverticulosis noted in the sigmoid colonModerate sized internal hemorrhoids. negative random colon bx.. next TCS 10/2023   COLONOSCOPY WITH PROPOFOL  N/A 08/23/2023   Procedure: COLONOSCOPY WITH PROPOFOL ;  Surgeon: Cindie Carlin POUR, DO;  [facility];  Service: Endoscopy;  Laterality: N/A;  9:30 am, asa 3   Cyst Removed     from Left leg and knee   ESOPHAGOGASTRODUODENOSCOPY  05/20/2012   SLF: MILD ESOPHAGITIS.NO  BARRETT'S/Multiple ulcers ranging between 3-5 mm/MILD Duodenal inflammation was found in the duodenal bulb   ESOPHAGOGASTRODUODENOSCOPY N/A 09/05/2012   SLF: MILD Non-erosive gastritis (inflammation) in the gastric antrum. Biopsies benign, no H. pylori.   ESOPHAGOGASTRODUODENOSCOPY N/A 10/24/2013   DOQ:ENDDPAOZ DISTAL ESOPHAGEAL WEBSmall hiatal herniaMILD Non-erosive gastritis. SB bx benign. gastric bx, minimal inflammation.    GANGLION CYST EXCISION     from L wrist and pins placed for Fx involving L thumb   HAND SURGERY  at the age of 3   Brother accidentally cut it   MALONEY DILATION N/A 10/24/2013   Procedure: AGAPITO DILATION;  Surgeon: Margo LITTIE Haddock, MD;  Location: [facility];  Service: Endoscopy;  Laterality: N/A;   MASS EXCISION  04/29/2012   Procedure: EXCISION MASS;  Surgeon: Thresa JAYSON Pulling, MD;  Location: [facility];  Service: General;  Laterality: Right;  Excision of Vascular Mass Right Arm   SAVORY DILATION N/A 10/24/2013   Procedure: SAVORY DILATION;  Surgeon: Margo LITTIE Haddock, MD;  Location: [facility];  Service: Endoscopy;  Laterality: N/A;   SHOULDER SURGERY Left 2017   TUMOR REMOVAL  1994   Benign [facility] brain [facility]   UPPER GASTROINTESTINAL ENDOSCOPY  JAN 2011   GASTRIC ULCER-EOS GRANULOMA   UPPER GASTROINTESTINAL ENDOSCOPY  JUN 2011  GASTRIC ULCER- EOS, NO H. PYLORI   UPPER GASTROINTESTINAL ENDOSCOPY  NOV 2011   GASTRIC NODULE, no ulcer- EOS   XI ROBOTIC ASSISTED INGUINAL HERNIA REPAIR WITH MESH Right 09/25/2022   Procedure: XI ROBOTIC ASSISTED INGUINAL HERNIA REPAIR WITH MESH;  Surgeon: Evonnie Dorothyann LABOR, DO;  Location: [facility];  Service: General;  Laterality: Right;   Patient Active Problem List   Diagnosis Date Noted   Bilateral low back pain without sciatica 03/12/2024   Elevated PSA 11/23/2023   Chronic sinusitis 11/23/2023   Prediabetes 11/23/2023   Hemorrhoids 05/25/2023   Degeneration of intervertebral disc of lumbar region with discogenic  back pain 05/25/2023   Proteinuria 01/12/2023   S/P inguinal hernia repair 10/01/2022   Vitamin D  deficiency 03/09/2022   OA (osteoarthritis) of knee 11/05/2021   Nonrheumatic aortic valve stenosis 08/28/2021   BPH (benign prostatic hyperplasia) 02/26/2021   Systolic murmur 02/26/2021   Carpal tunnel syndrome 01/12/2020   Depression 03/10/2016   Cervical disc disorder with radiculopathy of cervical region 10/31/2014   IBS (irritable bowel syndrome) 10/19/2013   Hyperlipidemia 09/20/2012   Essential hypertension 09/20/2012   GERD (gastroesophageal reflux disease) 12/30/2011   Eosinophilic gastritis 07/07/2011    PCP: Tobie Suzzane POUR, MD   REFERRING PROVIDER: Bluford Jacqulyn MATSU, DO  REFERRING DIAG: M54.50 (ICD-10-CM) - Bilateral low back pain without sciatica, unspecified chronicity  Rationale for Evaluation and Treatment: Rehabilitation  THERAPY DIAG:  Weakness of both lower extremities  Impaired functional mobility and activity tolerance  Other low back pain  ONSET DATE: 2-3 months  SUBJECTIVE:                                                                                                                                                                                           SUBJECTIVE STATEMENT: Pt states he has not had any pain for 3 days.  Unsure what caused his pain then but feeling good today.    Eval:  Pt states he had a back operation back in 2010. Pt states back pain does not go into the legs and stays in low back. Pt states the back bothers him the most after mowing the yard and bending over.   PERTINENT HISTORY:  Low back surgery about 15 years ago HTN  PAIN:  Are you having pain? Yes: NPRS scale: 0/10 Pain location: middle of low back Pain description: constant, ache Aggravating factors: bending, first getting up, riding lawn mower Relieving factors: exercise, pain relievers  PRECAUTIONS: None  RED FLAGS: None   WEIGHT BEARING RESTRICTIONS:  No  FALLS:  Has patient fallen in last 6 months? 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  DIAGNOSTIC FINDINGS:  CLINICAL DATA:  Acute back pain.   EXAM: LUMBAR SPINE - 2-3 VIEW   COMPARISON:  01/21/2018.   FINDINGS: There is no evidence of acute lumbar spine fracture. There is mild anterolisthesis at C4-C5. Intervertebral disc space narrowing and degenerative endplate changes are noted at multiple levels and most pronounced at L4-L5. Mild facet arthropathy is noted.   IMPRESSION: 1. No acute fracture. 2. Multilevel degenerative disc disease and facet arthropathy. 3. Mild anterolisthesis at L4-L5, increased from 2019.  PATIENT SURVEYS:  Modified Oswestry: 19 / 50 = 38.0 %  COGNITION: Overall cognitive status: Within functional limits for tasks assessed     SENSATION: WFL   POSTURE: rounded shoulders, forward head, increased thoracic kyphosis, and flexed trunk   PALPATION: Pt demonstrates point tenderness to L2-L4 spinal segments, no tenderness noted with UPAs or palpation of sciatic nerve region bilaterally.  LUMBAR ROM:   AROM eval  Flexion 70, pain  Extension 20, worse pain  Right lateral flexion 25, pain  Left lateral flexion 12, worse pain  Right rotation 75% available  Left rotation 75% available   (Blank rows = not tested)  LOWER EXTREMITY ROM:     Active  Right eval Left eval  Hip flexion    Hip extension    Hip abduction    Hip adduction    Hip internal rotation    Hip external rotation    Knee  flexion    Knee extension    Ankle dorsiflexion    Ankle plantarflexion    Ankle inversion    Ankle eversion     (Blank rows = not tested)  LOWER EXTREMITY MMT:    MMT Right eval Left eval  Hip flexion 4 3+, pain  Hip extension 4 3+  Hip abduction 4 4  Hip adduction    Hip internal rotation    Hip external rotation    Knee flexion    Knee extension    Ankle dorsiflexion    Ankle plantarflexion    Ankle inversion    Ankle eversion     (Blank rows = not tested)  LUMBAR SPECIAL TESTS:  Straight leg raise test: Negative and Slump test: Positive, left side  FUNCTIONAL TESTS:  5 times sit to stand: 15.26, increased pain on left 2 minute walk test: 485, no increased back pain  GAIT: Distance walked: 485 Assistive device utilized: None Level of assistance: Complete Independence Comments: decreased stride length on LLE, slight antalgic gait, more pronounced as test progresses  TREATMENT DATE:  05/01/24: GTB rows, extension 2X10 each GTB pallof with NBOS 2X10 each direction STS with feet on blue foam and tidal tank 10X March on foam holding tidal tank 20X Balance beam in hallway 2RT tandem Balance beam in hallway 2RT side stepping Balance beam in hallway with hurdles step overs 2X 6, 1X12 2RT Quadruped alt UE/LE 10X each core stab Floor transfers, floor to standing able to complete 2X no assist NuStep, level 3 seat 8 UE/LE 5 minutes at EOS  04/28/24: NuStep, level 3, 4' STS with feet  on foam, x10, no UE use  +march, 10x  +tidal tank, 10x Aeromat, side stepping in // bars, down and back 2x      + three obstacles, 2 six inch and one 12 inch, down and back 2x Aeromat, tandem amb in // bars, down and back 2x      + three obstacles, 2 six inch and one 12 inch, down and back 2x Tandem stance on aeromat, passing 5 cones from R/L, eye gaze stays on cones, over and back 2x, twice with RLE, twice with LLE leading   04/26/24:  Squat front of chair to heel raise with no  UE support 10x   Marching with shoulder extension GTB alternating 10x 3  Paloff with GTB NBOS 10x each side  Lunge onto BOSU 10x no UE support  Tandem stance on foam 2x 30; 2nd set with 2# bar punching forward and return 2x 10  SLS Lt 46, Rt 46 Lateral step over 12in paired with STS to 18in step to simulate getting in and out of low car Lateral lunges 10x Leg press 6Pl 2x 10  04/19/24: Standing at // bars with minimal to no UE assist Squat front of chair (chair tapping) SLS Lt 25, Rt 25 SLS on foam  Rt 22, Lt 11 Vector stance 5x 5 no UE support required  Lunges on floor with cueing for mechanics 15x Lateral step over 12in paired with STS to 18in step to simulate getting in and out of low car Postural strengthening with GTB shoulder extension 2X10 rows 2X10 Marching with shoulder extension 2x 10 Leg press 5 Pl 2x 10 Tandem stance on foam 2x 30   PATIENT EDUCATION:  Education details: Pt was educated on findings of PT evaluation, prognosis, frequency of therapy visits and rationale, attendance policy, and HEP if given.   Person educated: Patient Education method: Explanation, Verbal cues, and Handouts Education comprehension: verbalized understanding, verbal cues required, and needs further education  HOME EXERCISE PROGRAM: Access Code: [email or web address] URL: [email or web address] Date: 04/05/2024 Prepared by: Lang Ada  Exercises - Supine Bridge  - 1 x daily - 7 x weekly - 3 sets - 10 reps - 3 hold - Supine Lower Trunk Rotation  - 1 x daily - 7 x weekly - 3 sets - 10 reps - Supine Posterior Pelvic Tilt  - 1 x daily - 7 x weekly - 3 sets - 10 reps - 3 hold  - Correct Seated Posture  - 5 x daily - 7 x weekly - 1 sets - 1 reps - Seated Scapular Retraction with External Rotation  - 2 x daily - 7 x weekly - 2 sets - 10 reps - 5 hold - Seated Chin Tuck with Neck Elongation  - 2 x daily - 7 x weekly - 2 sets - 10 reps - 5 hold - Sit to Stand  - 2 x daily - 7 x  weekly - 1 sets - 10 reps  04/19/24: GTB- postural strengthening.  -shoulder extension  - rows  ASSESSMENT:  CLINICAL IMPRESSION: Continues without pain today.  Focus remains on core stabilization and higher level functional tasks to mimic ADL's/stabilization.   Began high march on aeromat while holding tidal tank with noted challenge.  Progressed outside parallel bars to work on foam balance beam in both tandem and side stepping.  Added hurdles to increase single leg stance stabilization/control also with noted challenge.   Floor transfer completed with ability to got to floor and stand back up controlled  without assistance.  Pt did c/o a little lumbar discomfort when transitioning to standing.  Began quadruped opp UE/LE to help improve core strength.  Able to stand from 8 inch step also to replicate getting out of his wife's low car.  Pt is overall progressing well with all activities requiring close supervision but no physical assist.  Pt demonstrates good balance overall with no issues without UE use. P Patient will benefit from continued skilled physical therapy in order to address current deficits in order to improve overall function and QOL.    OBJECTIVE IMPAIRMENTS: Abnormal gait, decreased activity tolerance, decreased balance, decreased endurance, decreased knowledge of use of DME, decreased mobility, difficulty walking, decreased ROM, decreased strength, hypomobility, impaired flexibility, postural dysfunction, and pain.   ACTIVITY LIMITATIONS: carrying, lifting, bending, standing, squatting, transfers, and bed mobility  PARTICIPATION LIMITATIONS: meal prep, cleaning, laundry, driving, shopping, community activity, occupation, and yard work  PERSONAL FACTORS: Age, Past/current experiences, Time since onset of injury/illness/exacerbation, and 1 comorbidity: Hx of low back surgery are also affecting patient's functional outcome.   REHAB POTENTIAL: Fair chronic in nature  CLINICAL  DECISION MAKING: Stable/uncomplicated  EVALUATION COMPLEXITY: Low   GOALS: Goals reviewed with patient? No  SHORT TERM GOALS: Target date: 04/26/24  Pt will be independent with HEP in order to demonstrate participation in Physical Therapy POC.  Baseline: Goal status: INITIAL  2.  Pt will report 3/10 pain with mobility in order to demonstrate improved pain with ADLs.  Baseline: 5/10 Goal status: INITIAL  LONG TERM GOALS: Target date: 05/17/24  Pt will improve 5TSTS by at least 2.5 seconds in order to demonstrate improved functional strength to return to desired activities.  Baseline: see objective.  Goal status: INITIAL  2.  Pt will improve 2 MWT by 40 feet in order to demonstrate improved functional ambulatory capacity in community setting.  Baseline: see objective.  Goal status: INITIAL  3.  Pt will improve Modified Oswestry score by at least 6 points in order to demonstrate improved pain with functional goals and outcomes. Baseline: see objective.  Goal status: INITIAL  4.  Pt will report 2/10 pain with mobility in order to demonstrate reduced pain with ADLs lasting greater than 30 minutes.  Baseline: see objective.  Goal status: INITIAL   PLAN:  PT FREQUENCY: 1-2x/week  PT DURATION: 6 weeks  PLANNED INTERVENTIONS: [id number]-Therapeutic exercises, 97530- Therapeutic activity, [id number]- Neuromuscular re-education, 97535- Self Care, [id number]- Manual therapy, [phone]- Gait training, [phone]- Electrical stimulation (unattended), [phone]- Electrical stimulation (manual), [phone] (1-2 muscles), [phone] (3+ muscles)- Dry Needling, Patient/Family education, Balance training, Stair training, Spinal manipulation, Spinal mobilization, DME instructions, Cryotherapy, and Moist heat.  PLAN FOR NEXT SESSION:  progress core and LE strengthening as well as HEP as appropriate.      7:36 AM, 05/01/24 Greig KATHEE Fuse, PTA/CLT [facility] Ph: [phone]

## 2024-05-03 ENCOUNTER — Ambulatory Visit (HOSPITAL_COMMUNITY)

## 2024-05-03 ENCOUNTER — Encounter (HOSPITAL_COMMUNITY): Payer: Self-pay | Admitting: Physical Therapy

## 2024-05-03 DIAGNOSIS — Z7409 Other reduced mobility: Secondary | ICD-10-CM

## 2024-05-03 DIAGNOSIS — M5459 Other low back pain: Secondary | ICD-10-CM

## 2024-05-03 DIAGNOSIS — R29898 Other symptoms and signs involving the musculoskeletal system: Secondary | ICD-10-CM | POA: Diagnosis not present

## 2024-05-03 NOTE — Therapy (Signed)
 OUTPATIENT PHYSICAL THERAPY THORACOLUMBAR TREATMENT   Patient Name: Adam [name] MRN: [id number] DOB:[birth year], 73 y.o., male Today's Date: 05/03/2024  END OF SESSION:  PT End of Session - 05/03/24 0739     Visit Number 9    Number of Visits 12    Date for Recertification  05/17/24    Authorization Type [employer]    Authorization Time Period cohere approved 12 visits from 04/05/24-07/04/2024    Authorization - Visit Number 9    Authorization - Number of Visits 12    Progress Note Due on Visit 10    PT Start Time 0739    PT Stop Time 0817    PT Time Calculation (min) 38 min    Activity Tolerance Patient tolerated treatment well;No increased pain    Behavior During Therapy [facility] for tasks assessed/performed          Past Medical History:  Diagnosis Date   Allergy    BMI 31.0-31.9,adult JUNE 2011 180 LBS    Chronic back pain    Depression    Eosinophilic granuloma (HCC) JAN 2011 GASTRIC, followed by Dr. Cathern   EGD JAN 2011, JUN 2011, NOV 2011   Gastric ulcer 08/31/2012   GERD (gastroesophageal reflux disease)    HTN (hypertension)    Hyperlipemia    IBS (irritable bowel syndrome)    Prostate hypertrophy    Past Surgical History:  Procedure Laterality Date   BACK SURGERY     COLONOSCOPY  JAN 2011 COMPLETE   hypoTN and bradycardia w/ Demerol  ,Phenergan and Versed , [place] TICS, SML IH   COLONOSCOPY  2003   NUR-normal   COLONOSCOPY  06/21/09   Fields-sigmoid diverticulosis, sm int hemorrhoids   COLONOSCOPY N/A 10/24/2013   Normal mucosa in the terminal ileum Moderate diverticulosis noted in the sigmoid colonModerate sized internal hemorrhoids. negative random colon bx.. next TCS 10/2023   COLONOSCOPY WITH PROPOFOL  N/A 08/23/2023   Procedure: COLONOSCOPY WITH PROPOFOL ;  Surgeon: Cindie Carlin POUR, DO;  Location: [facility];  Service: Endoscopy;  Laterality: N/A;  9:30 am, asa 3   Cyst Removed     from Left leg and knee   ESOPHAGOGASTRODUODENOSCOPY   05/20/2012   SLF: MILD ESOPHAGITIS.NO BARRETT'S/Multiple ulcers ranging between 3-5 mm/MILD Duodenal inflammation was found in the duodenal bulb   ESOPHAGOGASTRODUODENOSCOPY N/A 09/05/2012   SLF: MILD Non-erosive gastritis (inflammation) in the gastric antrum. Biopsies benign, no H. pylori.   ESOPHAGOGASTRODUODENOSCOPY N/A 10/24/2013   DOQ:ENDDPAOZ DISTAL ESOPHAGEAL WEBSmall hiatal herniaMILD Non-erosive gastritis. SB bx benign. gastric bx, minimal inflammation.    GANGLION CYST EXCISION     from L wrist and pins placed for Fx involving L thumb   HAND SURGERY  at the age of 3   Brother accidentally cut it   MALONEY DILATION N/A 10/24/2013   Procedure: AGAPITO DILATION;  Surgeon: Margo LITTIE Haddock, MD;  Location: [facility];  Service: Endoscopy;  Laterality: N/A;   MASS EXCISION  04/29/2012   Procedure: EXCISION MASS;  Surgeon: Thresa JAYSON Pulling, MD;  Location: [facility];  Service: General;  Laterality: Right;  Excision of Vascular Mass Right Arm   SAVORY DILATION N/A 10/24/2013   Procedure: SAVORY DILATION;  Surgeon: Margo LITTIE Haddock, MD;  Location: [facility];  Service: Endoscopy;  Laterality: N/A;   SHOULDER SURGERY Left 2017   TUMOR REMOVAL  1994   [facility]ign [facility]   UPPER GASTROINTESTINAL ENDOSCOPY  JAN 2011   GASTRIC ULCER-EOS GRANULOMA  UPPER GASTROINTESTINAL ENDOSCOPY  JUN 2011   GASTRIC ULCER- EOS, NO H. PYLORI   UPPER GASTROINTESTINAL ENDOSCOPY  NOV 2011   GASTRIC NODULE, no ulcer- EOS   XI ROBOTIC ASSISTED INGUINAL HERNIA REPAIR WITH MESH Right 09/25/2022   Procedure: XI ROBOTIC ASSISTED INGUINAL HERNIA REPAIR WITH MESH;  Surgeon: Evonnie Dorothyann LABOR, DO;  Location: [facility];  Service: General;  Laterality: Right;   Patient Active Problem List   Diagnosis Date Noted   Bilateral low back pain without sciatica 03/12/2024   Elevated PSA 11/23/2023   Chronic sinusitis 11/23/2023   Prediabetes 11/23/2023   Hemorrhoids 05/25/2023   Degeneration of intervertebral  disc of lumbar region with discogenic back pain 05/25/2023   Proteinuria 01/12/2023   S/P inguinal hernia repair 10/01/2022   Vitamin D  deficiency 03/09/2022   OA (osteoarthritis) of knee 11/05/2021   Nonrheumatic aortic valve stenosis 08/28/2021   BPH (benign prostatic hyperplasia) 02/26/2021   Systolic murmur 02/26/2021   Carpal tunnel syndrome 01/12/2020   Depression 03/10/2016   Cervical disc disorder with radiculopathy of cervical region 10/31/2014   IBS (irritable bowel syndrome) 10/19/2013   Hyperlipidemia 09/20/2012   Essential hypertension 09/20/2012   GERD (gastroesophageal reflux disease) 12/30/2011   Eosinophilic gastritis 07/07/2011    PCP: Tobie Suzzane POUR, MD   REFERRING PROVIDER: Bluford Jacqulyn MATSU, DO  REFERRING DIAG: M54.50 (ICD-10-CM) - Bilateral low back pain without sciatica, unspecified chronicity  Rationale for Evaluation and Treatment: Rehabilitation  THERAPY DIAG:  Weakness of both lower extremities  Impaired functional mobility and activity tolerance  Other low back pain  ONSET DATE: 2-3 months  SUBJECTIVE:                                                                                                                                                                                           SUBJECTIVE STATEMENT: Pt stated he was surprised at how easy it was to get off the floor.  No reports of pain currently.  Eval:  Pt states he had a back operation back in 2010. Pt states back pain does not go into the legs and stays in low back. Pt states the back bothers him the most after mowing the yard and bending over.   PERTINENT HISTORY:  Low back surgery about 15 years ago HTN  PAIN:  Are you having pain? Yes: NPRS scale: 0/10 Pain location: middle of low back Pain description: constant, ache Aggravating factors: bending, first getting up, riding lawn mower Relieving factors: exercise, pain relievers  PRECAUTIONS: None  RED FLAGS: None   WEIGHT  BEARING RESTRICTIONS: No  FALLS:  Has patient fallen in last  6 months? 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  DIAGNOSTIC FINDINGS:  CLINICAL DATA:  Acute back pain.   EXAM: LUMBAR SPINE - 2-3 VIEW   COMPARISON:  01/21/2018.   FINDINGS: There is no evidence of acute lumbar spine fracture. There is mild anterolisthesis at C4-C5. Intervertebral disc space narrowing and degenerative endplate changes are noted at multiple levels and most pronounced at L4-L5. Mild facet arthropathy is noted.   IMPRESSION: 1. No acute fracture. 2. Multilevel degenerative disc disease and facet arthropathy. 3. Mild anterolisthesis at L4-L5, increased from 2019.  PATIENT SURVEYS:  Modified Oswestry: 19 / 50 = 38.0 %  COGNITION: Overall cognitive status: Within functional limits for tasks assessed     SENSATION: WFL   POSTURE: rounded shoulders, forward head, increased thoracic kyphosis, and flexed trunk   PALPATION: Pt demonstrates point tenderness to L2-L4 spinal segments, no tenderness noted with UPAs or palpation of sciatic nerve region bilaterally.  LUMBAR ROM:   AROM eval  Flexion 70, pain  Extension 20, worse pain  Right lateral flexion 25, pain  Left lateral flexion 12, worse pain  Right rotation 75% available  Left rotation 75% available   (Blank rows = not tested)  LOWER EXTREMITY ROM:     Active  Right eval Left eval  Hip flexion    Hip extension    Hip abduction    Hip adduction    Hip internal rotation    Hip external  rotation    Knee flexion    Knee extension    Ankle dorsiflexion    Ankle plantarflexion    Ankle inversion    Ankle eversion     (Blank rows = not tested)  LOWER EXTREMITY MMT:    MMT Right eval Left eval  Hip flexion 4 3+, pain  Hip extension 4 3+  Hip abduction 4 4  Hip adduction    Hip internal rotation    Hip external rotation    Knee flexion    Knee extension    Ankle dorsiflexion    Ankle plantarflexion    Ankle inversion    Ankle eversion     (Blank rows = not tested)  LUMBAR SPECIAL TESTS:  Straight leg raise test: Negative and Slump test: Positive, left side  FUNCTIONAL TESTS:  5 times sit to stand: 15.26, increased pain on left 2 minute walk test: 485, no increased back pain  GAIT: Distance walked: 485 Assistive device utilized: None Level of assistance: Complete Independence Comments: decreased stride length on LLE, slight antalgic gait, more pronounced as test progresses  TREATMENT DATE:  05/03/24: Treadmill Grade 3.0 speed 1.2-->1.5 x 5' Lateral step over 12in hurdle onto foam paired with STS to 18in step to simulate getting in and out of low car while holding yellow weighted ball 12 hurdles over balance beam  2RT Vector stance on foam 5x 5 no HHA [employer] walkout 4Pl 5RT retro then sidestep Leg press 6Pl 2x 10   05/01/24: GTB rows, extension 2X10 each GTB pallof with NBOS 2X10 each direction STS with feet on blue foam and tidal tank 10X  March on foam holding tidal tank 20X Balance beam in hallway 2RT tandem Balance beam in hallway 2RT side stepping Balance beam in hallway with hurdles step overs 2X 6, 1X12 2RT Quadruped alt UE/LE 10X each core stab Floor transfers, floor to standing able to complete 2X no assist NuStep, level 3 seat 8 UE/LE 5 minutes at EOS  04/28/24: NuStep, level 3, 4' STS with feet on foam, x10, no UE use  +march, 10x  +tidal tank, 10x Aeromat, side stepping in // bars, down and back 2x      + three obstacles,  2 six inch and one 12 inch, down and back 2x Aeromat, tandem amb in // bars, down and back 2x      + three obstacles, 2 six inch and one 12 inch, down and back 2x Tandem stance on aeromat, passing 5 cones from R/L, eye gaze stays on cones, over and back 2x, twice with RLE, twice with LLE leading   04/26/24:  Squat front of chair to heel raise with no UE support 10x   Marching with shoulder extension GTB alternating 10x 3  Paloff with GTB NBOS 10x each side  Lunge onto BOSU 10x no UE support  Tandem stance on foam 2x 30; 2nd set with 2# bar punching forward and return 2x 10  SLS Lt 46, Rt 46 Lateral step over 12in paired with STS to 18in step to simulate getting in and out of low car Lateral lunges 10x Leg press 6Pl 2x 10  04/19/24: Standing at // bars with minimal to no UE assist Squat front of chair (chair tapping) SLS Lt 25, Rt 25 SLS on foam  Rt 22, Lt 11 Vector stance 5x 5 no UE support required  Lunges on floor with cueing for mechanics 15x Lateral step over 12in paired with STS to 18in step to simulate getting in and out of low car Postural strengthening with GTB shoulder extension 2X10 rows 2X10 Marching with shoulder extension 2x 10 Leg press 5 Pl 2x 10 Tandem stance on foam 2x 30   PATIENT EDUCATION:  Education details: Pt was educated on findings of PT evaluation, prognosis, frequency of therapy visits and rationale, attendance policy, and HEP if given.   Person educated: Patient Education method: Explanation, Verbal cues, and Handouts Education comprehension: verbalized understanding, verbal cues required, and needs further education  HOME EXERCISE PROGRAM: Access Code: [email or web address] URL: [email or web address] Date: 04/05/2024 Prepared by: Lang Ada  Exercises - Supine Bridge  - 1 x daily - 7 x weekly - 3 sets - 10 reps - 3 hold - Supine Lower Trunk Rotation  - 1 x daily - 7 x weekly - 3 sets - 10 reps - Supine Posterior Pelvic Tilt  - 1  x daily - 7 x weekly - 3 sets - 10 reps - 3 hold  - Correct Seated Posture  - 5 x daily - 7 x weekly - 1 sets - 1 reps - Seated Scapular Retraction with External Rotation  - 2 x daily - 7 x weekly - 2 sets - 10 reps - 5 hold - Seated Chin Tuck with Neck Elongation  - 2 x daily - 7 x weekly - 2 sets - 10 reps - 5 hold - Sit to Stand  - 2 x daily - 7 x weekly - 1 sets - 10 reps  04/19/24: GTB- postural strengthening.  -shoulder extension  - rows  ASSESSMENT:  CLINICAL IMPRESSION: Began session with treadmill and reviewed benefit of walking  program, min cueing to improved gait mechanics to equalized weight distribution and stride length.  Session focus with stability exercises to assist with functional strengthening and balance training.  Included dynamic resistance and weighted gait for stability.  Pt presents with increased ease with getting in/out of low car surface scenerio, plans to try out soon.  No reports of pain through session.     OBJECTIVE IMPAIRMENTS: Abnormal gait, decreased activity tolerance, decreased balance, decreased endurance, decreased knowledge of use of DME, decreased mobility, difficulty walking, decreased ROM, decreased strength, hypomobility, impaired flexibility, postural dysfunction, and pain.   ACTIVITY LIMITATIONS: carrying, lifting, bending, standing, squatting, transfers, and bed mobility  PARTICIPATION LIMITATIONS: meal prep, cleaning, laundry, driving, shopping, community activity, occupation, and yard work  PERSONAL FACTORS: Age, Past/current experiences, Time since onset of injury/illness/exacerbation, and 1 comorbidity: Hx of low back surgery are also affecting patient's functional outcome.   REHAB POTENTIAL: Fair chronic in nature  CLINICAL DECISION MAKING: Stable/uncomplicated  EVALUATION COMPLEXITY: Low   GOALS: Goals reviewed with patient? No  SHORT TERM GOALS: Target date: 04/26/24  Pt will be independent with HEP in order to demonstrate  participation in Physical Therapy POC.  Baseline: Goal status: INITIAL  2.  Pt will report 3/10 pain with mobility in order to demonstrate improved pain with ADLs.  Baseline: 5/10 Goal status: INITIAL  LONG TERM GOALS: Target date: 05/17/24  Pt will improve 5TSTS by at least 2.5 seconds in order to demonstrate improved functional strength to return to desired activities.  Baseline: see objective.  Goal status: INITIAL  2.  Pt will improve 2 MWT by 40 feet in order to demonstrate improved functional ambulatory capacity in community setting.  Baseline: see objective.  Goal status: INITIAL  3.  Pt will improve Modified Oswestry score by at least 6 points in order to demonstrate improved pain with functional goals and outcomes. Baseline: see objective.  Goal status: INITIAL  4.  Pt will report 2/10 pain with mobility in order to demonstrate reduced pain with ADLs lasting greater than 30 minutes.  Baseline: see objective.  Goal status: INITIAL   PLAN:  PT FREQUENCY: 1-2x/week  PT DURATION: 6 weeks  PLANNED INTERVENTIONS: 97110-Therapeutic exercises, 97530- Therapeutic activity, [id number]- Neuromuscular re-education, 97535- Self Care, [id number]- Manual therapy, [phone]- Gait training, [phone]- Electrical stimulation (unattended), [phone]- Electrical stimulation (manual), [phone] (1-2 muscles), [phone] (3+ muscles)- Dry Needling, Patient/Family education, Balance training, Stair training, Spinal manipulation, Spinal mobilization, DME instructions, Cryotherapy, and Moist heat.  PLAN FOR NEXT SESSION:  progress core and LE strengthening as well as HEP as appropriate.  10th visit progress note next session.   Augustin Mclean, LPTA/CLT; CBIS [phone]  10:57 AM, 05/03/24

## 2024-05-08 ENCOUNTER — Encounter (HOSPITAL_COMMUNITY): Payer: Self-pay

## 2024-05-08 ENCOUNTER — Ambulatory Visit (HOSPITAL_COMMUNITY): Attending: Family Medicine

## 2024-05-08 DIAGNOSIS — Z7409 Other reduced mobility: Secondary | ICD-10-CM | POA: Insufficient documentation

## 2024-05-08 DIAGNOSIS — M5459 Other low back pain: Secondary | ICD-10-CM | POA: Diagnosis present

## 2024-05-08 DIAGNOSIS — R29898 Other symptoms and signs involving the musculoskeletal system: Secondary | ICD-10-CM | POA: Diagnosis present

## 2024-05-08 NOTE — Therapy (Signed)
 OUTPATIENT PHYSICAL THERAPY THORACOLUMBAR TREATMENT   Patient Name: Adam [name] MRN: [id number] DOB:09-Mar-1951, 73 y.o., male Today's Date: 05/08/2024  END OF SESSION:  PT End of Session - 05/08/24 0731     Visit Number 10    Number of Visits 12    Date for Recertification  05/17/24    Authorization Type [employer]IC[employer] H[employer]    Authorization Time Period cohere approved 12 visits from 04/05/24-07/04/2024    Authorization - Visit Number 10    Authorization - Number of Visits 12    Progress Note Due on Visit 10    PT Start Time 0732          Past Medical History:  Diagnosis Date   Allergy    BMI 31.0-31.9,adult JUNE 2011 180 LBS    Chronic back pain    Depression    Eosinophilic granuloma (HCC) JAN 2011 GASTRIC, followed by Dr. Cathern   EGD JAN 2011, JUN 2011, NOV 2011   Gastric ulcer 08/31/2012   GERD (gastroesophageal reflux disease)    HTN (hypertension)    Hyperlipemia    IBS (irritable bowel syndrome)    Prostate hypertrophy    Past Surgical History:  Procedure Laterality Date   BACK SURGERY     COLONOSCOPY  JAN 2011 COMPLETE   hypoTN and bradycardia w/ Demerol  ,Phenergan and Versed , [place] TICS, SML IH   COLONOSCOPY  2003   NUR-normal   COLONOSCOPY  06/21/09   Fields-sigmoid diverticulosis, sm int hemorrhoids   COLONOSCOPY N/A 10/24/2013   Normal mucosa in the terminal ileum Moderate diverticulosis noted in the sigmoid colonModerate sized internal hemorrhoids. negative random colon bx.. next TCS 10/2023   COLONOSCOPY WITH PROPOFOL  N/A 08/23/2023   Procedure: COLONOSCOPY WITH PROPOFOL ;  Surgeon: Cindie Carlin POUR, DO;  [facility];  Service: Endoscopy;  Laterality: N/A;  9:30 am, asa 3   Cyst Removed     from Left leg and knee   ESOPHAGOGASTRODUODENOSCOPY  05/20/2012   SLF: MILD ESOPHAGITIS.NO BARRETT'S/Multiple ulcers ranging between 3-5 mm/MILD Duodenal inflammation was found in the duodenal bulb   ESOPHAGOGASTRODUODENOSCOPY N/A 09/05/2012    SLF: MILD Non-erosive gastritis (inflammation) in the gastric antrum. Biopsies benign, no H. pylori.   ESOPHAGOGASTRODUODENOSCOPY N/A 10/24/2013   DOQ:ENDDPAOZ DISTAL ESOPHAGEAL WEBSmall hiatal herniaMILD Non-erosive gastritis. SB bx benign. gastric bx, minimal inflammation.    GANGLION CYST EXCISION     from L wrist and pins placed for Fx involving L thumb   HAND SURGERY  at the age of 3   Brother accidentally cut it   MALONEY DILATION N/A 10/24/2013   Procedure: AGAPITO DILATION;  Surgeon: Margo LITTIE Haddock, MD;  Location: [facility];  Service: Endoscopy;  Laterality: N/A;   MASS EXCISION  04/29/2012   Procedure: EXCISION MASS;  Surgeon: Thresa JAYSON Pulling, MD;  Location: [facility];  Service: General;  Laterality: Right;  Excision of Vascular Mass Right Arm   SAVORY DILATION N/A 10/24/2013   Procedure: SAVORY DILATION;  Surgeon: Margo LITTIE Haddock, MD;  Location: [facility];  Service: Endoscopy;  Laterality: N/A;   SHOULDER SURGERY Left 2017   TUMOR REMOVAL  1994   Benign from brain at [facility]   UPPER GASTROINTESTINAL ENDOSCOPY  JAN 2011   GASTRIC ULCER-EOS GRANULOMA   UPPER GASTROINTESTINAL ENDOSCOPY  JUN 2011   GASTRIC ULCER- EOS, NO H. PYLORI   UPPER GASTROINTESTINAL ENDOSCOPY  NOV 2011   GASTRIC NODULE, no ulcer- EOS   XI ROBOTIC ASSISTED INGUINAL  HERNIA REPAIR WITH MESH Right 09/25/2022   Procedure: XI ROBOTIC ASSISTED INGUINAL HERNIA REPAIR WITH MESH;  Surgeon: Evonnie Dorothyann LABOR, DO;  Location: [facility];  Service: General;  Laterality: Right;   Patient Active Problem List   Diagnosis Date Noted   Bilateral low back pain without sciatica 03/12/2024   Elevated PSA 11/23/2023   Chronic sinusitis 11/23/2023   Prediabetes 11/23/2023   Hemorrhoids 05/25/2023   Degeneration of intervertebral disc of lumbar region with discogenic back pain 05/25/2023   Proteinuria 01/12/2023   S/P inguinal hernia repair 10/01/2022   Vitamin D  deficiency 03/09/2022   OA (osteoarthritis) of knee  11/05/2021   Nonrheumatic aortic valve stenosis 08/28/2021   BPH (benign prostatic hyperplasia) 02/26/2021   Systolic murmur 02/26/2021   Carpal tunnel syndrome 01/12/2020   Depression 03/10/2016   Cervical disc disorder with radiculopathy of cervical region 10/31/2014   IBS (irritable bowel syndrome) 10/19/2013   Hyperlipidemia 09/20/2012   Essential hypertension 09/20/2012   GERD (gastroesophageal reflux disease) 12/30/2011   Eosinophilic gastritis 07/07/2011    PCP: Tobie Suzzane POUR, MD   REFERRING PROVIDER: Bluford Jacqulyn MATSU, DO  REFERRING DIAG: M54.50 (ICD-10-CM) - Bilateral low back pain without sciatica, unspecified chronicity  Rationale for Evaluation and Treatment: Rehabilitation  THERAPY DIAG:  Weakness of both lower extremities  Impaired functional mobility and activity tolerance  Other low back pain  ONSET DATE: 2-3 months  SUBJECTIVE:                                                                                                                                                                                           SUBJECTIVE STATEMENT: Pt stated he is feeling good today.  No reports of pain currently.  Feels he has improved by 90% since beginning therapy. Shared with a smile ability to get in and out of wife's vehicle with no issues.  Eval:  Pt states he had a back operation back in 2010. Pt states back pain does not go into the legs and stays in low back. Pt states the back bothers him the most after mowing the yard and bending over.   PERTINENT HISTORY:  Low back surgery about 15 years ago HTN  PAIN:  Are you having pain? Yes: NPRS scale: 0/10 Pain location: middle of low back Pain description: constant, ache Aggravating factors: bending, first getting up, riding lawn mower Relieving factors: exercise, pain relievers  PRECAUTIONS: None  RED FLAGS: None   WEIGHT BEARING RESTRICTIONS: No  FALLS:  Has patient fallen in last 6 months? 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  DIAGNOSTIC FINDINGS:  CLINICAL DATA:  Acute back pain.   EXAM: LUMBAR SPINE - 2-3 VIEW   COMPARISON:  01/21/2018.   FINDINGS: There is no evidence of acute lumbar spine fracture. There is mild anterolisthesis at C4-C5. Intervertebral disc space narrowing and degenerative endplate changes are noted at multiple levels and most pronounced at L4-L5. Mild facet arthropathy is noted.   IMPRESSION: 1. No acute fracture. 2. Multilevel degenerative disc disease and facet arthropathy. 3. Mild anterolisthesis at L4-L5, increased from 2019.  PATIENT SURVEYS:  Modified Oswestry: 19 / 50 = 38.0 %  Modified Oswestry:  MODIFIED OSWESTRY DISABILITY SCALE  Date: 05/08/24 Score  Pain intensity 0 = I can tolerate the pain I have without having to use pain medication.  2. Personal care (washing, dressing, etc.) 0 =  I can take care of myself normally without causing increased pain.  3. Lifting 1 = I can lift heavy weights, but it causes increased pain.  4. Walking 0 = Pain does not prevent me from walking any distance  5. Sitting 0 =  I can sit in any chair as long as I like.  6. Standing 0 =  I can stand as long as I want without increased pain.  7. Sleeping 0 = Pain does not prevent me from sleeping well.  8. Social Life 0 = My social life is normal and does not increase my pain.  9. Traveling 0 =  I can travel anywhere without increased pain.  10. Employment/ Homemaking 0 = My normal homemaking/job activities do not  cause pain.  Total 1/50= .02%   Interpretation of scores: Score Category Description  0-20% Minimal Disability The patient can cope with most living activities. Usually no treatment is indicated apart from advice on lifting, sitting and exercise  21-40% Moderate Disability The patient experiences more pain and difficulty with sitting, lifting and standing. Travel and social life are more difficult and they may be disabled from work. Personal care, sexual activity and sleeping are not grossly affected, and the patient can usually be managed by conservative means  41-60% Severe Disability Pain remains the main problem in this group, but activities of daily living are affected. These patients require a detailed investigation  61-80% Crippled Back pain impinges on all aspects of the patient's life. Positive intervention is required  81-100% Bed-bound These patients are either bed-bound or exaggerating their symptoms  Bluford FORBES Zoe DELENA Karon DELENA, et al. Surgery versus conservative management of stable thoracolumbar fracture: the PRESTO feasibility RCT. [place] ([place]): [employer]; 2021 Nov. [facility] Technology Assessment, No. 25.62.) Appendix 3, Oswestry Disability Index category descriptors. Available from: Findjewelers.cz  Minimally Clinically Important Difference (MCID) = 12.8%  COGNITION: Overall cognitive status: Within functional limits for tasks assessed     SENSATION: WFL   POSTURE: rounded shoulders, forward head, increased thoracic kyphosis, and flexed trunk   PALPATION: Pt demonstrates point tenderness to L2-L4 spinal segments, no tenderness noted with UPAs or palpation of sciatic nerve region bilaterally.  LUMBAR ROM:   AROM eval 05/08/24  Flexion 70, pain Able to touch toes pain free  Extension 20, worse pain  WNL, pain free  Right lateral flexion 25, pain Touches knee pain free  Left lateral flexion 12, worse pain Touches knee pain  free  Right rotation 75% available WNL, pain free  Left rotation 75% available WNL, pain free   (Blank rows = not tested)  LOWER EXTREMITY ROM:     Active  Right eval Left eval  Hip flexion    Hip extension    Hip abduction    Hip adduction    Hip internal rotation    Hip external rotation    Knee flexion    Knee extension    Ankle dorsiflexion    Ankle plantarflexion    Ankle inversion    Ankle eversion     (Blank rows = not tested)  LOWER EXTREMITY MMT:    MMT Right eval Left eval Right 05/08/24 Left 05/08/24  Hip flexion 4 3+, pain 5 5  Hip extension 4 3+ 4+ 4+  Hip abduction 4 4 5 5   Hip adduction      Hip internal rotation      Hip external rotation      Knee flexion   5 5  Knee extension      Ankle dorsiflexion   5 5  Ankle plantarflexion      Ankle inversion      Ankle eversion       (Blank rows = not tested)  LUMBAR SPECIAL TESTS:  Straight leg raise test: Negative and Slump test: Positive, left side  FUNCTIONAL TESTS:  5 times sit to stand: 15.26, increased pain on left 2 minute walk test: 485, no increased back pain  05/08/24: : 543ft, no increased pain  5STS 10.54   GAIT: Distance walked: 485 Assistive device utilized: None Level of assistance: Complete Independence Comments: decreased stride length on LLE, slight antalgic gait, more pronounced as test progresses  TREATMENT DATE:  05/08/24: 517 ft pain free MMT see above Oswestry 1/50 ROM see above 5STS 10.54  05/03/24: Treadmill Grade 3.0 speed 1.2-->1.5 x 5' Lateral step over 12in hurdle onto foam paired with STS to 18in step to simulate getting in and out of low car while holding yellow weighted ball 12 hurdles over balance beam  2RT Vector stance on foam 5x 5 no HHA Bodycraft walkout 4Pl 5RT retro then sidestep Leg press 6Pl 2x 10   05/01/24: GTB rows, extension 2X10 each GTB pallof with NBOS 2X10 each direction STS with feet on blue foam and tidal tank  10X March on foam holding tidal tank 20X Balance beam in hallway 2RT tandem Balance beam in hallway 2RT side stepping Balance beam in hallway with hurdles step overs 2X 6, 1X12 2RT Quadruped alt UE/LE 10X each core stab Floor transfers, floor to standing able to complete 2X no assist NuStep, level 3 seat 8 UE/LE 5 minutes at EOS  04/28/24: NuStep, level 3, 4' STS with feet on foam, x10, no UE use  +march, 10x  +tidal tank, 10x Aeromat, side stepping in // bars, down and back 2x      + three obstacles, 2 six inch and one 12 inch, down and back 2x Aeromat, tandem amb in // bars, down and back 2x      + three obstacles, 2 six inch and one 12 inch, down and back 2x Tandem stance on aeromat, passing 5 cones from R/L, eye gaze stays on cones, over and back 2x, twice with RLE, twice with LLE leading   04/26/24:  Squat front of  chair to heel raise with no UE support 10x   Marching with shoulder extension GTB alternating 10x 3  Paloff with GTB NBOS 10x each side  Lunge onto BOSU 10x no UE support  Tandem stance on foam 2x 30; 2nd set with 2# bar punching forward and return 2x 10  SLS Lt 46, Rt 46 Lateral step over 12in paired with STS to 18in step to simulate getting in and out of low car Lateral lunges 10x Leg press 6Pl 2x 10  04/19/24: Standing at // bars with minimal to no UE assist Squat front of chair (chair tapping) SLS Lt 25, Rt 25 SLS on foam  Rt 22, Lt 11 Vector stance 5x 5 no UE support required  Lunges on floor with cueing for mechanics 15x Lateral step over 12in paired with STS to 18in step to simulate getting in and out of low car Postural strengthening with GTB shoulder extension 2X10 rows 2X10 Marching with shoulder extension 2x 10 Leg press 5 Pl 2x 10 Tandem stance on foam 2x 30   PATIENT EDUCATION:  Education details: Pt was educated on findings of PT evaluation, prognosis, frequency of therapy visits and rationale, attendance policy, and HEP if  given.   Person educated: Patient Education method: Explanation, Verbal cues, and Handouts Education comprehension: verbalized understanding, verbal cues required, and needs further education  HOME EXERCISE PROGRAM: Access Code: [email or web address] URL: [email or web address] Date: 04/05/2024 Prepared by: Lang Ada  Exercises - Supine Bridge  - 1 x daily - 7 x weekly - 3 sets - 10 reps - 3 hold - Supine Lower Trunk Rotation  - 1 x daily - 7 x weekly - 3 sets - 10 reps - Supine Posterior Pelvic Tilt  - 1 x daily - 7 x weekly - 3 sets - 10 reps - 3 hold  - Correct Seated Posture  - 5 x daily - 7 x weekly - 1 sets - 1 reps - Seated Scapular Retraction with External Rotation  - 2 x daily - 7 x weekly - 2 sets - 10 reps - 5 hold - Seated Chin Tuck with Neck Elongation  - 2 x daily - 7 x weekly - 2 sets - 10 reps - 5 hold - Sit to Stand  - 2 x daily - 7 x weekly - 1 sets - 10 reps  04/19/24: GTB- postural strengthening.  -shoulder extension  - rows  ASSESSMENT:  CLINICAL IMPRESSION: Reviewed goals with the following findings:  Pt has met 2/2 STGs and 3/4 LTGs.  Pt reports compliance with current HEP daily, able to recall and demonstrate appropriate mechanics with exercise program.  Pt reports improvements by 90% since beginning therapy and improved self perceived functional ability with Oswestry survey to 1/50.  Presents with improved cadence during , improved strength and ROM.  Pt's biggest concern with therapy was getting in and out of wife's low vehicle, reports with a smile today that he drove her car with no difficulty to therapy today.  Reviewed importance of compliance with HEP for maximal benefits with verbalized understanding.  DC to HEP per goals met.  OBJECTIVE IMPAIRMENTS: Abnormal gait, decreased activity tolerance, decreased balance, decreased endurance, decreased knowledge of use of DME, decreased mobility, difficulty walking, decreased ROM, decreased strength,  hypomobility, impaired flexibility, postural dysfunction, and pain.   ACTIVITY LIMITATIONS: carrying, lifting, bending, standing, squatting, transfers, and bed mobility  PARTICIPATION LIMITATIONS: meal prep, cleaning, laundry, driving, shopping, community activity, occupation, and yard work  PERSONAL FACTORS: Age, Past/current  experiences, Time since onset of injury/illness/exacerbation, and 1 comorbidity: Hx of low back surgery are also affecting patient's functional outcome.   REHAB POTENTIAL: Fair chronic in nature  CLINICAL DECISION MAKING: Stable/uncomplicated  EVALUATION COMPLEXITY: Low   GOALS: Goals reviewed with patient? No  SHORT TERM GOALS: Target date: 04/26/24  Pt will be independent with HEP in order to demonstrate participation in Physical Therapy POC.  Baseline:  05/08/24:  Reports compliance with HEP daily Goal status: MET  2.  Pt will report 3/10 pain with mobility in order to demonstrate improved pain with ADLs.  Baseline: 5/10, pain free Goal status: MET  LONG TERM GOALS: Target date: 05/17/24  Pt will improve 5TSTS by at least 2.5 seconds in order to demonstrate improved functional strength to return to desired activities.  Baseline: see objective.  Goal status: MET  2.  Pt will improve 2 MWT by 40 feet in order to demonstrate improved functional ambulatory capacity in community setting.  Baseline: see objective. ;05/08/24:  Improved by 20ft pain free Goal status: IN PROGRESS  3.  Pt will improve Modified Oswestry score by at least 6 points in order to demonstrate improved pain with functional goals and outcomes. Baseline: see objective.; 05/08/24: 1/50 Goal status: MET  4.  Pt will report 2/10 pain with mobility in order to demonstrate reduced pain with ADLs lasting greater than 30 minutes.  Baseline: see objective. ;05/08/24:  Able to perform all ADLs without pain. Goal status: MET   PLAN:  PT FREQUENCY: 1-2x/week  PT DURATION: 6  weeks  PLANNED INTERVENTIONS: 97110-Therapeutic exercises, 97530- Therapeutic activity, [id number]- Neuromuscular re-education, 97535- Self Care, [id number]- Manual therapy, [phone]- Gait training, [phone]- Electrical stimulation (unattended), [phone]- Electrical stimulation (manual), [phone] (1-2 muscles), [phone] (3+ muscles)- Dry Needling, Patient/Family education, Balance training, Stair training, Spinal manipulation, Spinal mobilization, DME instructions, Cryotherapy, and Moist heat.  PLAN FOR NEXT SESSION: DC to HEP.   Augustin Mclean, LPTA/CLT; CBIS [phone]  7:32 AM, 05/08/24

## 2024-05-10 ENCOUNTER — Ambulatory Visit (HOSPITAL_COMMUNITY): Admitting: Physical Therapy

## 2024-05-15 ENCOUNTER — Ambulatory Visit (HOSPITAL_COMMUNITY)

## 2024-05-24 ENCOUNTER — Encounter: Payer: Self-pay | Admitting: Internal Medicine

## 2024-05-24 ENCOUNTER — Ambulatory Visit: Admitting: Internal Medicine

## 2024-05-24 VITALS — BP 158/86 | HR 60 | Ht 64.0 in | Wt 171.6 lb

## 2024-05-24 DIAGNOSIS — K219 Gastro-esophageal reflux disease without esophagitis: Secondary | ICD-10-CM

## 2024-05-24 DIAGNOSIS — N401 Enlarged prostate with lower urinary tract symptoms: Secondary | ICD-10-CM

## 2024-05-24 DIAGNOSIS — R053 Chronic cough: Secondary | ICD-10-CM | POA: Diagnosis not present

## 2024-05-24 DIAGNOSIS — M5136 Other intervertebral disc degeneration, lumbar region with discogenic back pain only: Secondary | ICD-10-CM

## 2024-05-24 DIAGNOSIS — I10 Essential (primary) hypertension: Secondary | ICD-10-CM | POA: Diagnosis not present

## 2024-05-24 DIAGNOSIS — R338 Other retention of urine: Secondary | ICD-10-CM

## 2024-05-24 DIAGNOSIS — R7303 Prediabetes: Secondary | ICD-10-CM | POA: Diagnosis not present

## 2024-05-24 DIAGNOSIS — R972 Elevated prostate specific antigen [PSA]: Secondary | ICD-10-CM | POA: Diagnosis not present

## 2024-05-24 DIAGNOSIS — E782 Mixed hyperlipidemia: Secondary | ICD-10-CM

## 2024-05-24 DIAGNOSIS — E559 Vitamin D deficiency, unspecified: Secondary | ICD-10-CM

## 2024-05-24 DIAGNOSIS — K581 Irritable bowel syndrome with constipation: Secondary | ICD-10-CM | POA: Diagnosis not present

## 2024-05-24 MED ORDER — ALBUTEROL SULFATE HFA 108 (90 BASE) MCG/ACT IN AERS: 2.0000 | INHALATION_SPRAY | Freq: Four times a day (QID) | RESPIRATORY_TRACT | 0 refills | Status: AC | PRN

## 2024-05-24 NOTE — Assessment & Plan Note (Signed)
 Has had intermittent cough, unclear if related to chronic sinusitis versus bronchospasm Advised to avoid cold exposure Coricidin HBP as needed for cough Trial of albuterol  as needed for chronic cough/wheezing Check CXR

## 2024-05-24 NOTE — Assessment & Plan Note (Signed)
 Could be due to BPH PSA: 4.4 -> 4.0 Recheck PSA today

## 2024-05-24 NOTE — Assessment & Plan Note (Addendum)
 BP Readings from Last 1 Encounters:  05/24/24 (!) 152/80   Elevated today as he has not had antihypertensives today Well-controlled at home with Verapamil  180 mg BID, Valsartan -HCTZ 320-25 mg QD, Hydralazine  25 mg BID and Doxazosin  8 mg QD Counseled for compliance with the medications Advised DASH diet and moderate exercise/walking, at least 150 mins/week

## 2024-05-24 NOTE — Assessment & Plan Note (Deleted)
 Had recent worsening of low back pain Currently undergoing PT - noticed improvement with it Diclofenac  as needed for pain Avoid heavy lifting and frequent bending Heating pad and/or back brace as needed

## 2024-05-24 NOTE — Progress Notes (Signed)
 Established Patient Office Visit  Subjective:  Patient ID: Adam [name], male    DOB: 11-16-1950  Age: 73 y.o. MRN: [id number]  CC:  Chief Complaint  Patient presents with   Annual Exam    Annual exam   Cough    Ongoing cough for 1 month, has used otc medication not helping    HPI Adam [name] is a 73 y.o. male with past medical history of HTN, GERD, eosinophilic gastritis, BPH, HLD and depression who presents for f/u of his chronic medical conditions.  HTN: BP is elevated today, but well-controlled at home. Takes medications regularly, but has not taken them today as he is fasting for blood work.  He takes valsartan -HCTZ 320-25 mg QD, hydralazine  25 mg BID and verapamil  180 mg BID. Patient denies headache, dizziness, chest pain, dyspnea or palpitations.  BPH: He is taking doxazosin . He reports improvement in urinary retention and postvoid dribbling currently.  Denies any dysuria or hematuria.  His PSA was elevated - 4.4 in 12/24, but was 4.0 in 06/25.  GERD: He takes Pantoprazole  for it. He denies any dysphagia or odynophagia currently.  Chronic constipation: He reports chronic constipation, has hard stools, has to strain at times.  He has been taking Dulcolax with mild relief. He currently denies any episode of melena, hematochezia or diarrhea.  He also has history of internal hemorrhoids and has used Anusol  suppository with adequate response.  Lumbar DDD: He has chronic low back pain, intermittent, dull, nonradiating.  Denies any recent injury or fall.  He has tried taking Tylenol  with mild relief.  Denies any numbness or tingling of the LE.  He is undergoing PT for it, which has improved his pain.  He reports intermittent dry cough for the last 1 month.  Denies any fever, chills, dyspnea or wheezing currently.  Of note, he has history of chronic sinusitis and uses Flonase  for it.   Past Medical History:  Diagnosis Date   Allergy    BMI 31.0-31.9,adult JUNE 2011  180 LBS    Chronic back pain    Depression    Eosinophilic granuloma (HCC) JAN 2011 GASTRIC, followed by Dr. Cathern   EGD JAN 2011, JUN 2011, NOV 2011   Gastric ulcer 08/31/2012   GERD (gastroesophageal reflux disease)    HTN (hypertension)    Hyperlipemia    IBS (irritable bowel syndrome)    Prostate hypertrophy     Past Surgical History:  Procedure Laterality Date   BACK SURGERY     COLONOSCOPY  JAN 2011 COMPLETE   hypoTN and bradycardia w/ Demerol  ,Phenergan and Versed , [place] TICS, SML IH   COLONOSCOPY  2003   NUR-normal   COLONOSCOPY  06/21/09   Fields-sigmoid diverticulosis, sm int hemorrhoids   COLONOSCOPY N/A 10/24/2013   Normal mucosa in the terminal ileum Moderate diverticulosis noted in the sigmoid colonModerate sized internal hemorrhoids. negative random colon bx.. next TCS 10/2023   COLONOSCOPY WITH PROPOFOL  N/A 08/23/2023   Procedure: COLONOSCOPY WITH PROPOFOL ;  Surgeon: Cindie Carlin POUR, DO;  [facility];  Service: Endoscopy;  Laterality: N/A;  9:30 am, asa 3   Cyst Removed     from Left leg and knee   ESOPHAGOGASTRODUODENOSCOPY  05/20/2012   SLF: MILD ESOPHAGITIS.NO BARRETT'S/Multiple ulcers ranging between 3-5 mm/MILD Duodenal inflammation was found in the duodenal bulb   ESOPHAGOGASTRODUODENOSCOPY N/A 09/05/2012   SLF: MILD Non-erosive gastritis (inflammation) in the gastric antrum. Biopsies benign, no H. pylori.   ESOPHAGOGASTRODUODENOSCOPY N/A 10/24/2013  SLF:POSSIBLE DISTAL ESOPHAGEAL WEBSmall hiatal herniaMILD Non-erosive gastritis. SB bx benign. gastric bx, minimal inflammation.    GANGLION CYST EXCISION     from L wrist and pins placed for Fx involving L thumb   HAND SURGERY  at the age of 3   Brother accidentally cut it   MALONEY DILATION N/A 10/24/2013   Procedure: AGAPITO DILATION;  Surgeon: Margo LITTIE Haddock, MD;  Location: [facility];  Service: Endoscopy;  Laterality: N/A;   MASS EXCISION  04/29/2012   Procedure: EXCISION MASS;  Surgeon: Thresa JAYSON Pulling, MD;  Location: [facility];  Service: General;  Laterality: Right;  Excision of Vascular Mass Right Arm   SAVORY DILATION N/A 10/24/2013   Procedure: SAVORY DILATION;  Surgeon: Margo LITTIE Haddock, MD;  Location: [facility];  Service: Endoscopy;  Laterality: N/A;   SHOULDER SURGERY Left 2017   TUMOR REMOVAL  1994   Benign from brain at [facility]   UPPER GASTROINTESTINAL ENDOSCOPY  JAN 2011   GASTRIC ULCER-EOS GRANULOMA   UPPER GASTROINTESTINAL ENDOSCOPY  JUN 2011   GASTRIC ULCER- EOS, NO H. PYLORI   UPPER GASTROINTESTINAL ENDOSCOPY  NOV 2011   GASTRIC NODULE, no ulcer- EOS   XI ROBOTIC ASSISTED INGUINAL HERNIA REPAIR WITH MESH Right 09/25/2022   Procedure: XI ROBOTIC ASSISTED INGUINAL HERNIA REPAIR WITH MESH;  Surgeon: Evonnie Dorothyann LABOR, DO;  Location: [facility];  Service: General;  Laterality: Right;    Family History  Problem Relation Age of Onset   Hypertension Mother    Diabetes Mother    Hyperlipidemia Mother    Thyroid  disease Mother    Hypertension Father    Diabetes Father    Stroke Father    Colon cancer Maternal Grandmother        OVER 21    Social History   Socioeconomic History   Marital status: Married    Spouse name: Heron   Number of children: 1   Years of education: 12   Highest education level: Not on file  Occupational History   Occupation: disabled  Tobacco Use   Smoking status: Former    Current packs/day: 0.00    Average packs/day: 0.3 packs/day for 15.0 years (3.8 ttl pk-yrs)    Types: Cigars, Cigarettes    Start date: 04/30/1987    Quit date: 04/29/2002    Years since quitting: 22.0   Smokeless tobacco: Never  Vaping Use   Vaping status: Never Used  Substance and Sexual Activity   Alcohol use: Not Currently    Comment: rarely   Drug use: No   Sexual activity: Yes    Birth control/protection: None  Other Topics Concern   Not on file  Social History Narrative   Lives w/ wife   Caffeine use: 1 cup coffee every morning    Social Drivers of Health   Tobacco Use: Medium Risk (05/24/2024)   Patient History    Smoking Tobacco Use: Former    Smokeless Tobacco Use: Never    Passive Exposure: Not on Actuary Strain: Low Risk (06/21/2023)   Overall Financial Resource Strain (CARDIA)    Difficulty of Paying Living Expenses: Not hard at all  Food Insecurity: No Food Insecurity (06/21/2023)   Hunger Vital Sign    Worried About Running Out of Food in the Last Year: Never true    Ran Out of Food in the Last Year: Never true  Transportation Needs: No Transportation Needs (06/21/2023)   PRAPARE - Transportation  Lack of Transportation (Medical): No    Lack of Transportation (Non-Medical): No  Physical Activity: Sufficiently Active (06/21/2023)   Exercise Vital Sign    Days of Exercise per Week: 7 days    Minutes of Exercise per Session: 30 min  Stress: No Stress Concern Present (06/21/2023)   [employer] of Occupational Health - Occupational Stress Questionnaire    Feeling of Stress : Not at all  Social Connections: Moderately Integrated (06/21/2023)   Social Connection and Isolation Panel    Frequency of Communication with Friends and Family: More than three times a week    Frequency of Social Gatherings with Friends and Family: More than three times a week    Attends Religious Services: More than 4 times per year    Active Member of [employer] or Organizations: No    Attends Banker Meetings: Never    Marital Status: Married  Catering Manager Violence: Not At Risk (06/21/2023)   Humiliation, Afraid, Rape, and Kick questionnaire    Fear of Current or Ex-Partner: No    Emotionally Abused: No    Physically Abused: No    Sexually Abused: No  Depression (PHQ2-9): Low Risk (05/24/2024)   Depression (PHQ2-9)    PHQ-2 Score: 0  Alcohol Screen: Low Risk (06/21/2023)   Alcohol Screen    Last Alcohol Screening Score (AUDIT): 0  Housing: Low Risk (06/21/2023)   Housing Stability Vital  Sign    Unable to Pay for Housing in the Last Year: No    Number of Times Moved in the Last Year: 0    Homeless in the Last Year: No  Utilities: Not At Risk (06/21/2023)   AHC Utilities    Threatened with loss of utilities: No  Health Literacy: Adequate Health Literacy (06/21/2023)   B1300 Health Literacy    Frequency of need for help with medical instructions: Never    Outpatient Medications Prior to Visit  Medication Sig Dispense Refill   diclofenac  (VOLTAREN ) 75 MG EC tablet Take 1 tablet (75 mg total) by mouth 2 (two) times daily as needed for moderate pain (pain score 4-6). 30 tablet 0   doxazosin  (CARDURA ) 8 MG tablet TAKE 1 TABLET AT BEDTIME 90 tablet 3   fluticasone  (FLONASE ) 50 MCG/ACT nasal spray SPRAY 2 SPRAYS INTO EACH NOSTRIL EVERY DAY 48 mL 1   hydrALAZINE  (APRESOLINE ) 25 MG tablet TAKE 1 TABLET TWICE DAILY 180 tablet 3   hydrocortisone  (ANUSOL -HC) 25 MG suppository Place 1 suppository (25 mg total) rectally 2 (two) times daily as needed for hemorrhoids or anal itching. 12 suppository 0   loratadine (CLARITIN) 10 MG tablet Take 10 mg by mouth daily as needed for allergies.      pantoprazole  (PROTONIX ) 40 MG tablet TAKE 1 TABLET TWICE DAILY 180 tablet 3   PARoxetine  (PAXIL ) 20 MG tablet TAKE 1 TABLET EVERY DAY 90 tablet 3   pravastatin  (PRAVACHOL ) 20 MG tablet TAKE 1 TABLET AT BEDTIME (NEED MD APPOINTMENT) 90 tablet 3   valsartan -hydrochlorothiazide  (DIOVAN -HCT) 320-25 MG tablet TAKE 1 TABLET EVERY DAY 90 tablet 3   verapamil  (CALAN -SR) 180 MG CR tablet Take 1 tablet (180 mg total) by mouth 2 (two) times daily. 180 tablet 3   baclofen  (LIORESAL ) 10 MG tablet Take 0.5-1 tablets (5-10 mg total) by mouth 3 (three) times daily as needed for muscle spasms. Caution dizziness. 30 each 0   guaiFENesin -codeine  100-10 MG/5ML syrup Take 5 mLs by mouth 3 (three) times daily as needed for cough. 120 mL 0  No facility-administered medications prior to visit.    Allergies  Allergen  Reactions   Sulfonamide Derivatives     REACTION: causes hives    ROS Review of Systems  Constitutional:  Negative for chills and fever.  HENT:  Negative for congestion and sore throat.   Eyes:  Negative for pain and discharge.  Respiratory:  Positive for cough. Negative for shortness of breath.   Cardiovascular:  Negative for chest pain and palpitations.  Gastrointestinal:  Positive for constipation. Negative for diarrhea, nausea and vomiting.  Endocrine: Negative for polydipsia and polyuria.  Genitourinary:  Negative for dysuria and hematuria.  Musculoskeletal:  Positive for arthralgias and back pain. Negative for neck pain and neck stiffness.  Skin:  Negative for rash.  Neurological:  Negative for dizziness, weakness, numbness and headaches.  Psychiatric/Behavioral:  Negative for agitation and behavioral problems.       Objective:    Physical Exam Vitals reviewed.  Constitutional:      General: He is not in acute distress.    Appearance: He is not diaphoretic.  HENT:     Head: Normocephalic and atraumatic.     Nose: Congestion present.     Mouth/Throat:     Mouth: Mucous membranes are moist.  Eyes:     General: No scleral icterus.    Extraocular Movements: Extraocular movements intact.  Cardiovascular:     Rate and Rhythm: Normal rate and regular rhythm.     Heart sounds: Murmur (Systolic) heard.  Pulmonary:     Breath sounds: Normal breath sounds. No wheezing or rales.  Abdominal:     Palpations: Abdomen is soft.     Tenderness: There is no abdominal tenderness.  Musculoskeletal:     Cervical back: Neck supple. No tenderness.     Right lower leg: No edema.     Left lower leg: No edema.  Skin:    General: Skin is warm.     Findings: No rash.     Comments: S/p excision of mole over scalp  Neurological:     General: No focal deficit present.     Mental Status: He is alert and oriented to person, place, and time.  Psychiatric:        Mood and Affect: Mood  normal.        Behavior: Behavior normal.     BP (!) 158/86 (BP Location: Left Arm)   Pulse 60   Ht 5' 4 (1.626 m)   Wt 171 lb 9.6 oz (77.8 kg)   SpO2 97%   BMI 29.46 kg/m  Wt Readings from Last 3 Encounters:  05/24/24 171 lb 9.6 oz (77.8 kg)  03/09/24 167 lb (75.8 kg)  11/23/23 163 lb 9.6 oz (74.2 kg)    Lab Results  Component Value Date   TSH 2.070 05/26/2023   Lab Results  Component Value Date   WBC 5.2 05/26/2023   HGB 13.7 05/26/2023   HCT 41.6 05/26/2023   MCV 85 05/26/2023   PLT 319 05/26/2023   Lab Results  Component Value Date   NA 138 11/23/2023   K 3.9 11/23/2023   CO2 21 11/23/2023   GLUCOSE 86 11/23/2023   BUN 20 11/23/2023   CREATININE 1.19 11/23/2023   BILITOT 0.5 11/23/2023   ALKPHOS 54 11/23/2023   AST 23 11/23/2023   ALT 22 11/23/2023   PROT 6.0 11/23/2023   ALBUMIN 4.1 11/23/2023   CALCIUM  9.8 11/23/2023   ANIONGAP 11 08/18/2023   EGFR 64 11/23/2023  Lab Results  Component Value Date   CHOL 188 05/26/2023   Lab Results  Component Value Date   HDL 67 05/26/2023   Lab Results  Component Value Date   LDLCALC 107 (H) 05/26/2023   Lab Results  Component Value Date   TRIG 75 05/26/2023   Lab Results  Component Value Date   CHOLHDL 2.8 05/26/2023   Lab Results  Component Value Date   HGBA1C 5.5 11/23/2023      Assessment & Plan:   Problem List Items Addressed This Visit       Cardiovascular and Mediastinum   Essential hypertension - Primary   BP Readings from Last 1 Encounters:  05/24/24 (!) 152/80   Elevated today as he has not had antihypertensives today Well-controlled at home with Verapamil  180 mg BID, Valsartan -HCTZ 320-25 mg QD, Hydralazine  25 mg BID and Doxazosin  8 mg QD Counseled for compliance with the medications Advised DASH diet and moderate exercise/walking, at least 150 mins/week      Relevant Orders   TSH   CMP14+EGFR   CBC with Differential/Platelet     Digestive   GERD (gastroesophageal  reflux disease)   Well controlled, on Pantoprazole       IBS (irritable bowel syndrome)   Chronic constipation Advised to take Senokot-S twice daily as needed MiraLAX as needed for persistent constipation Maintain adequate hydration, can also try prune juice        Musculoskeletal and Integument   Degeneration of intervertebral disc of lumbar region with discogenic back pain   X-ray of lumbar spine showed DDD of lumbar spine and mild anterolisthesis at L4-L5 Had recent worsening of low back pain Currently undergoing PT - noticed improvement with it Diclofenac  as needed for pain Avoid heavy lifting and frequent bending Heating pad and/or back brace as needed        Genitourinary   BPH (benign prostatic hyperplasia)   Has urinary retention - now improved On Doxazosin  8 mg QD PSA elevated in 12/24, will recheck today        Other   Hyperlipidemia   On Pravastatin  20 mg QD      Relevant Orders   Lipid panel   Vitamin D  deficiency   Advised to take Vit D 2000 IU QD      Relevant Orders   VITAMIN D  25 Hydroxy (Vit-D Deficiency, Fractures)   Elevated PSA   Could be due to BPH PSA: 4.4 -> 4.0 Recheck PSA today      Relevant Orders   PSA   Prediabetes   Lab Results  Component Value Date   HGBA1C 5.5 11/23/2023   Advised to follow DASH diet      Relevant Orders   Hemoglobin A1c   Chronic cough   Has had intermittent cough, unclear if related to chronic sinusitis versus bronchospasm Advised to avoid cold exposure Coricidin HBP as needed for cough Trial of albuterol  as needed for chronic cough/wheezing Check CXR      Relevant Medications   albuterol  (VENTOLIN  HFA) 108 (90 Base) MCG/ACT inhaler   Other Relevant Orders   DG Chest 2 View     Meds ordered this encounter  Medications   albuterol  (VENTOLIN  HFA) 108 (90 Base) MCG/ACT inhaler    Sig: Inhale 2 puffs into the lungs every 6 (six) hours as needed for wheezing or shortness of breath.     Dispense:  18 g    Refill:  0    Okay to substitute to generic/formulary Albuterol .  Follow-up: Return in about 3 months (around 08/22/2024) for HTN.    Suzzane MARLA Blanch, MD

## 2024-05-24 NOTE — Assessment & Plan Note (Signed)
 On Pravastatin 20 mg QD

## 2024-05-24 NOTE — Assessment & Plan Note (Signed)
Advised to take Vit D 2000 IU QD

## 2024-05-24 NOTE — Patient Instructions (Addendum)
 Please use Albuterol  for episodes of dry cough and wheezing.  Please use Coricidin HBP as needed for cough.  Please continue to take other medications as prescribed.  Please continue to follow low salt diet and perform moderate exercise/walking at least 150 mins/week.

## 2024-05-24 NOTE — Assessment & Plan Note (Signed)
 X-ray of lumbar spine showed DDD of lumbar spine and mild anterolisthesis at L4-L5 Had recent worsening of low back pain Currently undergoing PT - noticed improvement with it Diclofenac  as needed for pain Avoid heavy lifting and frequent bending Heating pad and/or back brace as needed

## 2024-05-24 NOTE — Assessment & Plan Note (Signed)
 Has urinary retention - now improved On Doxazosin  8 mg QD PSA elevated in 12/24, will recheck today

## 2024-05-24 NOTE — Assessment & Plan Note (Signed)
 Lab Results  Component Value Date   HGBA1C 5.5 11/23/2023   Advised to follow DASH diet

## 2024-05-24 NOTE — Assessment & Plan Note (Signed)
 Chronic constipation Advised to take Senokot-S twice daily as needed MiraLAX as needed for persistent constipation Maintain adequate hydration, can also try prune juice

## 2024-05-24 NOTE — Assessment & Plan Note (Signed)
Well-controlled, on Pantoprazole. 

## 2024-05-25 ENCOUNTER — Ambulatory Visit: Payer: Self-pay | Admitting: Internal Medicine

## 2024-05-25 LAB — CMP14+EGFR
ALT: 19 IU/L (ref 0–44)
AST: 19 IU/L (ref 0–40)
Albumin: 4.5 g/dL (ref 3.8–4.8)
Alkaline Phosphatase: 60 IU/L (ref 47–123)
BUN/Creatinine Ratio: 15 (ref 10–24)
BUN: 20 mg/dL (ref 8–27)
Bilirubin Total: 0.7 mg/dL (ref 0.0–1.2)
CO2: 26 mmol/L (ref 20–29)
Calcium: 10.1 mg/dL (ref 8.6–10.2)
Chloride: 101 mmol/L (ref 96–106)
Creatinine, Ser: 1.34 mg/dL — ABNORMAL HIGH (ref 0.76–1.27)
Globulin, Total: 2.1 g/dL (ref 1.5–4.5)
Glucose: 84 mg/dL (ref 70–99)
Potassium: 4.1 mmol/L (ref 3.5–5.2)
Sodium: 140 mmol/L (ref 134–144)
Total Protein: 6.6 g/dL (ref 6.0–8.5)
eGFR: 55 mL/min/1.73 — ABNORMAL LOW (ref >59)

## 2024-05-25 LAB — CBC WITH DIFFERENTIAL/PLATELET
Basophils Absolute: 0.1 x10E3/uL (ref 0.0–0.2)
Basos: 1 %
EOS (ABSOLUTE): 0.3 x10E3/uL (ref 0.0–0.4)
Eos: 4 %
Hematocrit: 44.2 % (ref 37.5–51.0)
Hemoglobin: 14.7 g/dL (ref 13.0–17.7)
Immature Grans (Abs): 0 x10E3/uL (ref 0.0–0.1)
Immature Granulocytes: 0 %
Lymphocytes Absolute: 1.6 x10E3/uL (ref 0.7–3.1)
Lymphs: 23 %
MCH: 28.2 pg (ref 26.6–33.0)
MCHC: 33.3 g/dL (ref 31.5–35.7)
MCV: 85 fL (ref 79–97)
Monocytes Absolute: 0.5 x10E3/uL (ref 0.1–0.9)
Monocytes: 7 %
Neutrophils Absolute: 4.5 x10E3/uL (ref 1.4–7.0)
Neutrophils: 65 %
Platelets: 308 x10E3/uL (ref 150–450)
RBC: 5.21 x10E6/uL (ref 4.14–5.80)
RDW: 14.1 % (ref 11.6–15.4)
WBC: 7 x10E3/uL (ref 3.4–10.8)

## 2024-05-25 LAB — LIPID PANEL
Chol/HDL Ratio: 3.4 ratio (ref 0.0–5.0)
Cholesterol, Total: 210 mg/dL — ABNORMAL HIGH (ref 100–199)
HDL: 62 mg/dL (ref >39)
LDL Chol Calc (NIH): 131 mg/dL — ABNORMAL HIGH (ref 0–99)
Triglycerides: 96 mg/dL (ref 0–149)
VLDL Cholesterol Cal: 17 mg/dL (ref 5–40)

## 2024-05-25 LAB — HEMOGLOBIN A1C
Est. average glucose Bld gHb Est-mCnc: 111 mg/dL
Hgb A1c MFr Bld: 5.5 % (ref 4.8–5.6)

## 2024-05-25 LAB — TSH: TSH: 1.34 u[IU]/mL (ref 0.450–4.500)

## 2024-05-25 LAB — PSA: Prostate Specific Ag, Serum: 4 ng/mL (ref 0.0–4.0)

## 2024-05-25 LAB — VITAMIN D 25 HYDROXY (VIT D DEFICIENCY, FRACTURES): Vit D, 25-Hydroxy: 29.8 ng/mL — ABNORMAL LOW (ref 30.0–100.0)

## 2024-05-28 ENCOUNTER — Other Ambulatory Visit: Payer: Self-pay | Admitting: Internal Medicine

## 2024-05-28 DIAGNOSIS — I10 Essential (primary) hypertension: Secondary | ICD-10-CM

## 2024-06-04 ENCOUNTER — Other Ambulatory Visit: Payer: Self-pay | Admitting: Internal Medicine

## 2024-06-04 DIAGNOSIS — I10 Essential (primary) hypertension: Secondary | ICD-10-CM

## 2024-06-21 ENCOUNTER — Ambulatory Visit: Payer: Medicare HMO

## 2024-08-22 ENCOUNTER — Ambulatory Visit: Payer: Self-pay | Admitting: Internal Medicine
